# Patient Record
Sex: Male | Born: 1940
Health system: Southern US, Community
[De-identification: ages and names within clinical notes are randomized; demographics above are authoritative.]

## PROBLEM LIST (undated history)

## (undated) DIAGNOSIS — N200 Calculus of kidney: Secondary | ICD-10-CM

## (undated) DIAGNOSIS — I2510 Atherosclerotic heart disease of native coronary artery without angina pectoris: Secondary | ICD-10-CM

## (undated) DIAGNOSIS — E119 Type 2 diabetes mellitus without complications: Secondary | ICD-10-CM

## (undated) DIAGNOSIS — I719 Aortic aneurysm of unspecified site, without rupture: Secondary | ICD-10-CM

## (undated) DIAGNOSIS — R7302 Impaired glucose tolerance (oral): Secondary | ICD-10-CM

## (undated) DIAGNOSIS — E785 Hyperlipidemia, unspecified: Secondary | ICD-10-CM

## (undated) DIAGNOSIS — C679 Malignant neoplasm of bladder, unspecified: Secondary | ICD-10-CM

## (undated) DIAGNOSIS — G4730 Sleep apnea, unspecified: Secondary | ICD-10-CM

## (undated) DIAGNOSIS — I82409 Acute embolism and thrombosis of unspecified deep veins of unspecified lower extremity: Secondary | ICD-10-CM

## (undated) DIAGNOSIS — I10 Essential (primary) hypertension: Secondary | ICD-10-CM

## (undated) DIAGNOSIS — I219 Acute myocardial infarction, unspecified: Secondary | ICD-10-CM

## (undated) HISTORY — PX: HYDROCELE EXCISION: SHX482

## (undated) HISTORY — DX: Type 2 diabetes mellitus without complications: E11.9

## (undated) HISTORY — DX: Impaired glucose tolerance (oral): R73.02

## (undated) HISTORY — PX: PROSTATE SURGERY: SHX751

## (undated) HISTORY — DX: Aortic aneurysm of unspecified site, without rupture: I71.9

## (undated) HISTORY — DX: Acute myocardial infarction, unspecified: I21.9

## (undated) HISTORY — DX: Sleep apnea, unspecified: G47.30

## (undated) HISTORY — DX: Malignant neoplasm of bladder, unspecified: C67.9

## (undated) HISTORY — PX: OTHER SURGICAL HISTORY: SHX169

## (undated) HISTORY — DX: Acute embolism and thrombosis of unspecified deep veins of unspecified lower extremity: I82.409

## (undated) HISTORY — DX: Essential (primary) hypertension: I10

## (undated) HISTORY — DX: Hyperlipidemia, unspecified: E78.5

## (undated) HISTORY — DX: Atherosclerotic heart disease of native coronary artery without angina pectoris: I25.10

## (undated) HISTORY — DX: Calculus of kidney: N20.0

## (undated) HISTORY — PX: CARDIAC CATHETERIZATION: SHX172

---

## 1995-08-27 DIAGNOSIS — I219 Acute myocardial infarction, unspecified: Secondary | ICD-10-CM

## 1995-08-27 HISTORY — DX: Acute myocardial infarction, unspecified: I21.9

## 1995-08-27 HISTORY — PX: ANGIOPLASTY: SHX39

## 2004-02-28 ENCOUNTER — Other Ambulatory Visit: Payer: Self-pay

## 2004-08-26 LAB — HM COLONOSCOPY: HM Colonoscopy: NORMAL

## 2004-09-27 ENCOUNTER — Ambulatory Visit: Payer: Self-pay | Admitting: Urology

## 2004-10-19 ENCOUNTER — Ambulatory Visit: Payer: Self-pay | Admitting: Urology

## 2005-02-05 ENCOUNTER — Other Ambulatory Visit: Payer: Self-pay

## 2005-02-14 ENCOUNTER — Ambulatory Visit: Payer: Self-pay | Admitting: Otolaryngology

## 2005-04-09 ENCOUNTER — Ambulatory Visit: Payer: Self-pay | Admitting: Urology

## 2005-06-18 ENCOUNTER — Ambulatory Visit: Payer: Self-pay | Admitting: Urology

## 2005-07-04 ENCOUNTER — Ambulatory Visit: Payer: Self-pay | Admitting: Oncology

## 2005-07-05 ENCOUNTER — Ambulatory Visit: Payer: Self-pay | Admitting: Oncology

## 2005-07-26 ENCOUNTER — Ambulatory Visit: Payer: Self-pay | Admitting: Oncology

## 2005-08-26 ENCOUNTER — Ambulatory Visit: Payer: Self-pay | Admitting: Oncology

## 2005-09-26 ENCOUNTER — Ambulatory Visit: Payer: Self-pay | Admitting: Oncology

## 2005-10-25 ENCOUNTER — Ambulatory Visit: Payer: Self-pay | Admitting: Radiation Oncology

## 2006-04-09 ENCOUNTER — Other Ambulatory Visit: Payer: Self-pay

## 2006-04-15 ENCOUNTER — Ambulatory Visit: Payer: Self-pay | Admitting: Urology

## 2007-07-22 ENCOUNTER — Ambulatory Visit: Payer: Self-pay | Admitting: Urology

## 2007-10-07 DIAGNOSIS — I82409 Acute embolism and thrombosis of unspecified deep veins of unspecified lower extremity: Secondary | ICD-10-CM

## 2007-10-07 HISTORY — DX: Acute embolism and thrombosis of unspecified deep veins of unspecified lower extremity: I82.409

## 2008-01-20 ENCOUNTER — Ambulatory Visit: Payer: Self-pay | Admitting: Urology

## 2008-03-23 ENCOUNTER — Encounter: Payer: Self-pay | Admitting: Cardiovascular Disease

## 2008-04-08 ENCOUNTER — Encounter: Payer: Self-pay | Admitting: Cardiovascular Disease

## 2008-07-18 ENCOUNTER — Ambulatory Visit: Payer: Self-pay | Admitting: Urology

## 2008-07-18 ENCOUNTER — Encounter: Payer: Self-pay | Admitting: Cardiovascular Disease

## 2009-01-18 ENCOUNTER — Ambulatory Visit: Payer: Self-pay | Admitting: Urology

## 2009-07-17 ENCOUNTER — Ambulatory Visit: Payer: Self-pay | Admitting: Urology

## 2010-01-03 ENCOUNTER — Ambulatory Visit: Payer: Self-pay | Admitting: Urology

## 2010-04-24 ENCOUNTER — Encounter: Payer: Self-pay | Admitting: Cardiovascular Disease

## 2010-06-29 ENCOUNTER — Encounter: Payer: Self-pay | Admitting: Cardiovascular Disease

## 2010-06-29 ENCOUNTER — Ambulatory Visit: Payer: Self-pay | Admitting: Urology

## 2010-07-16 ENCOUNTER — Ambulatory Visit: Payer: Self-pay | Admitting: Cardiovascular Disease

## 2010-07-16 DIAGNOSIS — E7800 Pure hypercholesterolemia, unspecified: Secondary | ICD-10-CM | POA: Insufficient documentation

## 2010-07-16 DIAGNOSIS — I25118 Atherosclerotic heart disease of native coronary artery with other forms of angina pectoris: Secondary | ICD-10-CM | POA: Insufficient documentation

## 2010-07-16 DIAGNOSIS — I714 Abdominal aortic aneurysm, without rupture, unspecified: Secondary | ICD-10-CM | POA: Insufficient documentation

## 2010-07-16 DIAGNOSIS — I2510 Atherosclerotic heart disease of native coronary artery without angina pectoris: Secondary | ICD-10-CM | POA: Insufficient documentation

## 2010-07-24 ENCOUNTER — Encounter: Payer: Self-pay | Admitting: Cardiovascular Disease

## 2010-09-25 NOTE — Letter (Signed)
Summary: NPP  NPP   Imported By: Marylou Mccoy 07/30/2010 15:05:48  _____________________________________________________________________  External Attachment:    Type:   Image     Comment:   External Document

## 2010-09-25 NOTE — Letter (Signed)
Summary: [facility] Echo  [facility] Echo   Imported By: Marylou Mccoy 07/30/2010 15:02:09  _____________________________________________________________________  External Attachment:    Type:   Image     Comment:   External Document

## 2010-09-25 NOTE — Assessment & Plan Note (Signed)
Summary: NP6/AMD   Visit Type:  Initial Consult Primary Ejay Lashley:  Dr. Assunta Gambles  CC:  establish care- CAD.  History of Present Illness: Mr. Mall is a very pleasant 70 year old gentleman with a history of coronary artery is normal myocardial, moderate disease in diagonal vessel, RCA and proximal circumflex, obtuse marginal branch with normal systolic function, possible function on echocardiogram, history of DVT and PE: Bladder surgery, history of obstructive sleep apnea, hypertension, hyperlipidemia who presents to reestablish care. He was last seen at [facility] in 2010.   Reports that overall he has been feeling well. He was recently seen by Dr. Achilles Dunk and Dr. Wyn Quaker. He has been recently diagnosed with a small AAA. He denies any significant chest pain,  shortness of breath. He does not exercise. He has been tolerating simvastatin.   Last stress test was in 03/2008. He had an inferior wall defect consistent with ischemia. Significant GI uptake artifact noted in the inferior wall. EF 42%. No cardiac cath was performed at that time in follow up.  ECHO in 02/2008 shows normal E, otherwise normal study  Lipids have been at goal in 2009, LDL <70  EKG: NSR with rate of 73 bpm, with no significant ST or T wave changes  Preventive Screening-Counseling & Management  Alcohol-Tobacco     Smoking Status: never  Caffeine-Diet-Exercise     Does Patient Exercise: yes  Current Medications (verified): 1)  Ambien 10 Mg Tabs (Zolpidem Tartrate) .Marland Kitchen.. 1 Tablet At Bedtime 2)  Plavix 75 Mg Tabs (Clopidogrel Bisulfate) .Marland Kitchen.. 1 Tablet Once Daily 3)  Niaspan 1000 Mg Cr-Tabs (Niacin (Antihyperlipidemic)) .Marland Kitchen.. 1 Tablet Once Daily 4)  Ramipril 10 Mg Caps (Ramipril) .Marland Kitchen.. 1 Tablet Once Daily 5)  Ra Fish Oil 1000 Mg Caps (Omega-3 Fatty Acids) .... 2 Tablets in Morning and 2 Tablets in Afternoon 6)  Altace, ? Dose .Marland Kitchen.. 1 Tablet Once Daily 7)  Aspir-Low 81 Mg Tbec (Aspirin) .Marland Kitchen.. 1 Tablet Once Daily  Allergies  (verified): No Known Drug Allergies  Past History:  Family History: Last updated: 08-07-2010 Father:Deceased,Lung Cancer Mother:decreased, stroke  Social History: Last updated: 08-07-2010 Retired  Married  Tobacco Use - No.  Alcohol Use - no Regular Exercise - yes  Risk Factors: Exercise: yes (08/07/2010)  Risk Factors: Smoking Status: never (2010-08-07)  Past Medical History: Hydrocele Nephrolithiasis MI-1997 Bladder Cancer  Past Surgical History: Angioplasty- 1997- [facility] Bladder Removed Prostate Surgeryl  Family History: Father:Deceased,Lung Cancer Mother:decreased, stroke  Social History: Retired  Married  Tobacco Use - No.  Alcohol Use - no Regular Exercise - yes Smoking Status:  never Does Patient Exercise:  yes  Review of Systems       The patient complains of weight gain.  The patient denies fever, weight loss, vision loss, decreased hearing, hoarseness, chest pain, syncope, dyspnea on exertion, peripheral edema, prolonged cough, abdominal pain, incontinence, muscle weakness, depression, and enlarged lymph nodes.    Vital Signs:  Patient profile:   70 year old male Height:      71.5 inches Weight:      300.50 pounds BMI:     41.48 Pulse rate:   73 / minute BP sitting:   142 / 84  (left arm) Cuff size:   large  Vitals Entered By: Bishop Dublin, CMA (08/07/2010 3:13 PM)  Physical Exam  General:  Well developed, well nourished, in no acute distress.Obese Head:  normocephalic and atraumatic Neck:  Neck supple, no JVD. No masses, thyromegaly or abnormal cervical nodes. Lungs:  Clear bilaterally to auscultation and percussion. Heart:  Non-displaced PMI, chest non-tender; regular rate and rhythm, S1, S2 without murmurs, rubs or gallops. Carotid upstroke normal, no bruit. Pedals normal pulses. No edema, no varicosities. Abdomen:  Bowel sounds positive; abdomen soft and non-tender without masses, obese Msk:  Back normal, normal gait.  Muscle strength and tone normal. Pulses:  pulses normal in all 4 extremities Extremities:  No clubbing or cyanosis. Neurologic:  Alert and oriented x 3. Skin:  Intact without lesions or rashes. Psych:  Normal affect.   Impression & Recommendations:  Problem # 1:  CAD, NATIVE VESSEL (ICD-414.01) No symptoms at this time. would not qualify for stress testing at this time. Will monitor closely.  His updated medication list for this problem includes:    Plavix 75 Mg Tabs (Clopidogrel bisulfate) .Marland Kitchen... 1 tablet once daily    Ramipril 10 Mg Caps (Ramipril) .Marland Kitchen... 1 tablet once daily    Aspir-low 81 Mg Tbec (Aspirin) .Marland Kitchen... 1 tablet once daily  Problem # 2:  HYPERLIPIDEMIA-MIXED (ICD-272.4) Will try to obtain most recent lipids fro Dr. Oswaldo Done  His updated medication list for this problem includes:    Niaspan 1000 Mg Cr-tabs (Niacin (antihyperlipidemic)) .Marland Kitchen... 1 tablet once daily  Problem # 3:  OBESITY-MORBID (>100') (ICD-278.01) Have encouraged him to join weight watchers and start exercising.  Problem # 4:  ABDOMINAL AORTIC ANEURYSM (ICD-441.4) Small AAA noted. Will continue lipid management with goal LDL <70.  Patient Instructions: 1)  Your physician recommends that you schedule a follow-up appointment in: 1 year 2)  Your physician recommends a diabetes diet. Please see handout given to you at today's office visit.  Appended Document: NP6/AMD Notes from Dr. Achilles Dunk indicate he has a h/o bladder cancer. Currently in remission.

## 2010-09-25 NOTE — Letter (Signed)
Summary: [facility] - Coronary Diagram  [facility] - Coronary Diagram   Imported By: Marylou Mccoy 07/30/2010 15:02:53  _____________________________________________________________________  External Attachment:    Type:   Image     Comment:   External Document

## 2010-09-25 NOTE — Letter (Signed)
Summary: [facility] - Stress  [facility] - Stress   Imported By: Marylou Mccoy 07/30/2010 15:00:48  _____________________________________________________________________  External Attachment:    Type:   Image     Comment:   External Document

## 2010-09-25 NOTE — Letter (Signed)
Summary: [facility]   [facility]   Imported By: Marylou Mccoy 08/02/2010 16:11:47  _____________________________________________________________________  External Attachment:    Type:   Image     Comment:   External Document

## 2010-09-25 NOTE — Letter (Signed)
Summary: Medical Record Release  Medical Record Release   Imported By: Harlon Flor 07/17/2010 15:10:47  _____________________________________________________________________  External Attachment:    Type:   Image     Comment:   External Document

## 2010-10-25 ENCOUNTER — Encounter: Payer: Self-pay | Admitting: Cardiovascular Disease

## 2011-01-07 ENCOUNTER — Ambulatory Visit: Payer: Self-pay | Admitting: Urology

## 2011-01-07 ENCOUNTER — Encounter: Payer: Self-pay | Admitting: Cardiovascular Disease

## 2011-03-13 ENCOUNTER — Encounter: Payer: Self-pay | Admitting: Cardiovascular Disease

## 2011-07-22 ENCOUNTER — Ambulatory Visit: Payer: Self-pay | Admitting: Urology

## 2011-07-23 ENCOUNTER — Ambulatory Visit: Payer: Self-pay | Admitting: Urology

## 2011-07-23 DIAGNOSIS — I10 Essential (primary) hypertension: Secondary | ICD-10-CM

## 2011-07-30 ENCOUNTER — Ambulatory Visit: Payer: Self-pay | Admitting: Urology

## 2011-08-01 LAB — PATHOLOGY REPORT

## 2011-08-27 HISTORY — PX: COLONOSCOPY: SHX174

## 2011-12-18 ENCOUNTER — Other Ambulatory Visit: Payer: Self-pay | Admitting: Medical Oncology

## 2011-12-18 NOTE — Telephone Encounter (Signed)
Wrong chart

## 2011-12-19 ENCOUNTER — Ambulatory Visit: Payer: Self-pay | Admitting: Family Medicine

## 2012-01-06 ENCOUNTER — Ambulatory Visit: Payer: PRIVATE HEALTH INSURANCE | Admitting: Cardiovascular Disease

## 2012-01-08 ENCOUNTER — Ambulatory Visit (INDEPENDENT_AMBULATORY_CARE_PROVIDER_SITE_OTHER): Payer: PRIVATE HEALTH INSURANCE | Admitting: Cardiovascular Disease

## 2012-01-08 ENCOUNTER — Encounter: Payer: Self-pay | Admitting: Cardiovascular Disease

## 2012-01-08 VITALS — BP 125/80 | HR 74 | Ht 71.0 in | Wt 288.0 lb

## 2012-01-08 DIAGNOSIS — E785 Hyperlipidemia, unspecified: Secondary | ICD-10-CM

## 2012-01-08 DIAGNOSIS — I2510 Atherosclerotic heart disease of native coronary artery without angina pectoris: Secondary | ICD-10-CM

## 2012-01-08 DIAGNOSIS — I714 Abdominal aortic aneurysm, without rupture, unspecified: Secondary | ICD-10-CM

## 2012-01-08 NOTE — Assessment & Plan Note (Addendum)
He has periodic followup with Dr. Wyn Quaker. He reports 2.5 cm aneurysm (?) which is borderline mildly enlarged.

## 2012-01-08 NOTE — Assessment & Plan Note (Signed)
No symptoms at this time. EKG unchanged. We have suggested he increase his exercise. We have discussed symptoms that we would be concerned about increasing shortness of breath, chest tightness, sweating, excess fatigue. He will call us if he has any of the symptoms. We have recommended continued weight loss with goal LDL less than 70.

## 2012-01-08 NOTE — Patient Instructions (Signed)
You are doing well. No medication changes were made.  Please call us if you have new issues that need to be addressed before your next appt.  Your physician wants you to follow-up in: 12 months.  You will receive a reminder letter in the mail two months in advance. If you don't receive a letter, please call our office to schedule the follow-up appointment. 

## 2012-01-08 NOTE — Assessment & Plan Note (Signed)
Cholesterol is very close to goal. We have encouraged him to continue his weight loss and commended him on his recent 18 pound weight drop.

## 2012-01-08 NOTE — Progress Notes (Signed)
Patient ID: Christian [name], male    DOB: Feb 01, 1941, 71 y.o.   MRN: [id number]  HPI Comments: [name] is a very pleasant 71 year old gentleman with a history of  obesity , CAD, moderate disease in a diagonal vessel, RCA and proximal circumflex, obtuse marginal branch with normal systolic function, possible function on echocardiogram, history of DVT and PE, Bladder surgery, history of obstructive sleep apnea, hypertension, hyperlipidemia, small AAA,  who presents for routine followup.   Reports that overall he has been feeling well. He has recently lost 18 pounds by diet modification. He is not exercising a tremendous amount. He denies any significant back or knee pain. No significant shortness of breath or chest discomfort with exertion. No new complaints.   Last stress test was in 03/2008. He had an inferior wall defect consistent with ischemia. Significant GI uptake artifact noted in the inferior wall. EF 42%. No cardiac cath was performed at that time in follow up.   ECHO in 02/2008 shows normal E, otherwise normal study  Lipids have been at goal in 2012, LDL <75, total cholesterol 145   EKG done by Dr. Katrinka Blazing shows: NSR  with no significant ST or T wave changes No changes when compared to EKG from 2012      Outpatient Encounter Prescriptions as of 01/08/2012  Medication Sig Dispense Refill  . aspirin (ASPIR-LOW) 81 MG EC tablet Take 81 mg by mouth daily.        . clopidogrel (PLAVIX) 75 MG tablet Take 75 mg by mouth daily.        . Omega-3 Fatty Acids (RA FISH OIL) 1000 MG CAPS Take by mouth. Take 2 tabs in morning and 2 tabs i afternoon       . ramipril (ALTACE) 10 MG tablet Take 10 mg by mouth daily.        . simvastatin (ZOCOR) 40 MG tablet Take 40 mg by mouth daily.       Review of Systems  Constitutional: Negative.   HENT: Negative.   Eyes: Negative.   Respiratory: Negative.   Cardiovascular: Negative.   Gastrointestinal: Negative.   Musculoskeletal: Negative.   Skin:  Negative.   Neurological: Negative.   Hematological: Negative.   Psychiatric/Behavioral: Negative.   All other systems reviewed and are negative.    BP 125/80  Pulse 74  Ht 5\' 11"  (1.803 m)  Wt 288 lb (130.636 kg)  BMI 40.17 kg/m2  Physical Exam  Nursing note and vitals reviewed. Constitutional: He is oriented to person, place, and time. He appears well-developed and well-nourished.       Obese  HENT:  Head: Normocephalic.  Nose: Nose normal.  Mouth/Throat: Oropharynx is clear and moist.  Eyes: Conjunctivae are normal. Pupils are equal, round, and reactive to light.  Neck: Normal range of motion. Neck supple. No JVD present.  Cardiovascular: Normal rate, regular rhythm, S1 normal, S2 normal, normal heart sounds and intact distal pulses.  Exam reveals no gallop and no friction rub.   No murmur heard. Pulmonary/Chest: Effort normal and breath sounds normal. No respiratory distress. He has no wheezes. He has no rales. He exhibits no tenderness.  Abdominal: Soft. Bowel sounds are normal. He exhibits no distension. There is no tenderness.  Musculoskeletal: Normal range of motion. He exhibits no edema and no tenderness.  Lymphadenopathy:    He has no cervical adenopathy.  Neurological: He is alert and oriented to person, place, and time. Coordination normal.  Skin: Skin is warm and dry. No  rash noted. No erythema.  Psychiatric: He has a normal mood and affect. His behavior is normal. Judgment and thought content normal.           Assessment and Plan

## 2012-08-03 ENCOUNTER — Ambulatory Visit: Payer: Self-pay | Admitting: Unknown Physician Specialty

## 2012-08-03 LAB — HM COLONOSCOPY

## 2012-08-30 ENCOUNTER — Other Ambulatory Visit: Payer: Self-pay | Admitting: Family Medicine

## 2012-08-31 ENCOUNTER — Other Ambulatory Visit: Payer: Self-pay | Admitting: Family Medicine

## 2012-08-31 NOTE — Telephone Encounter (Signed)
No paper chart °

## 2013-01-20 ENCOUNTER — Ambulatory Visit: Payer: Self-pay | Admitting: Urology

## 2013-01-20 DIAGNOSIS — N200 Calculus of kidney: Secondary | ICD-10-CM | POA: Insufficient documentation

## 2013-01-20 DIAGNOSIS — N430 Encysted hydrocele: Secondary | ICD-10-CM | POA: Insufficient documentation

## 2013-05-14 ENCOUNTER — Encounter: Payer: Self-pay | Admitting: Family Medicine

## 2013-05-20 ENCOUNTER — Ambulatory Visit: Payer: PRIVATE HEALTH INSURANCE | Admitting: Cardiovascular Disease

## 2013-05-24 ENCOUNTER — Ambulatory Visit (INDEPENDENT_AMBULATORY_CARE_PROVIDER_SITE_OTHER): Payer: PRIVATE HEALTH INSURANCE | Admitting: Cardiovascular Disease

## 2013-05-24 ENCOUNTER — Encounter: Payer: Self-pay | Admitting: Cardiovascular Disease

## 2013-05-24 VITALS — BP 130/68 | HR 76 | Ht 71.5 in | Wt 294.2 lb

## 2013-05-24 DIAGNOSIS — E785 Hyperlipidemia, unspecified: Secondary | ICD-10-CM

## 2013-05-24 DIAGNOSIS — I2510 Atherosclerotic heart disease of native coronary artery without angina pectoris: Secondary | ICD-10-CM

## 2013-05-24 MED ORDER — NITROGLYCERIN 0.4 MG SL SUBL: 0.4000 mg | SUBLINGUAL_TABLET | SUBLINGUAL | Status: DC | PRN

## 2013-05-24 NOTE — Progress Notes (Signed)
Patient ID: Christian [name], male    DOB: 1941/06/30, 72 y.o.   MRN: [id number]  HPI Comments: Mr. Scheuring is a very pleasant 72 year old gentleman with a history of  obesity , CAD, moderate disease in a diagonal vessel, RCA and proximal circumflex, obtuse marginal branch with normal systolic function, possible function on echocardiogram, history of DVT and PE, Bladder surgery, history of obstructive sleep apnea, hypertension, hyperlipidemia, small AAA,  who presents for routine followup.   Reports that overall he has been feeling well.  He is not exercising a tremendous amount. His wife has been having significant medical issues.  He denies any significant back or knee pain. No significant shortness of breath or chest discomfort with exertion. No new complaints. He does report when he pushed most, he has pressure in the back of his arms. This is typically after he has been doing the weed eating. He does not have arm pain with any other aerobic activity.   Last stress test was in 03/2008. He had an inferior wall defect consistent with ischemia. Significant GI uptake artifact noted in the inferior wall. EF 42%. No cardiac cath was performed at that time in follow up.   ECHO in 02/2008 shows normal E, otherwise normal study  Recent lab work shows total cholesterol 115, LDL 51, HDL 30 EKG  shows: NSR  with nonspecific ST and T wave abnormality in the anterior precordial leads, 3 and aVF     Outpatient Encounter Prescriptions as of 05/24/2013  Medication Sig Dispense Refill  . aspirin (ASPIR-LOW) 81 MG EC tablet Take 81 mg by mouth daily.        . clopidogrel (PLAVIX) 75 MG tablet Take 75 mg by mouth daily.        . nitroGLYCERIN (NITROSTAT) 0.4 MG SL tablet Place 0.4 mg under the tongue every 5 (five) minutes as needed for chest pain.      . Omega-3 Fatty Acids (RA FISH OIL) 1000 MG CAPS Take by mouth. Take 2 tabs in morning and 2 tabs i afternoon       . ramipril (ALTACE) 10 MG tablet Take 10 mg by  mouth daily.        . simvastatin (ZOCOR) 40 MG tablet Take 40 mg by mouth daily.       No facility-administered encounter medications on file as of 05/24/2013.    Review of Systems  Constitutional: Negative.   HENT: Negative.   Eyes: Negative.   Respiratory: Negative.   Cardiovascular: Negative.   Gastrointestinal: Negative.   Endocrine: Negative.   Musculoskeletal: Negative.   Skin: Negative.   Allergic/Immunologic: Negative.   Neurological: Negative.   Hematological: Negative.   Psychiatric/Behavioral: Negative.   All other systems reviewed and are negative.    BP 130/68  Pulse 76  Ht 5' 11.5" (1.816 m)  Wt 294 lb 4 oz (133.471 kg)  BMI 40.47 kg/m2  Physical Exam  Nursing note and vitals reviewed. Constitutional: He is oriented to person, place, and time. He appears well-developed and well-nourished.  Obese  HENT:  Head: Normocephalic.  Nose: Nose normal.  Mouth/Throat: Oropharynx is clear and moist.  Eyes: Conjunctivae are normal. Pupils are equal, round, and reactive to light.  Neck: Normal range of motion. Neck supple. No JVD present.  Cardiovascular: Normal rate, regular rhythm, S1 normal, S2 normal, normal heart sounds and intact distal pulses.  Exam reveals no gallop and no friction rub.   No murmur heard. Pulmonary/Chest: Effort normal and breath sounds normal.  No respiratory distress. He has no wheezes. He has no rales. He exhibits no tenderness.  Abdominal: Soft. Bowel sounds are normal. He exhibits no distension. There is no tenderness.  Musculoskeletal: Normal range of motion. He exhibits no edema and no tenderness.  Lymphadenopathy:    He has no cervical adenopathy.  Neurological: He is alert and oriented to person, place, and time. Coordination normal.  Skin: Skin is warm and dry. No rash noted. No erythema.  Psychiatric: He has a normal mood and affect. His behavior is normal. Judgment and thought content normal.      Assessment and Plan

## 2013-05-24 NOTE — Assessment & Plan Note (Signed)
Asymptomatic with exertion apart from some mild arm pain bilaterally after weed eating and then push mowing. Arm pain not reproducible with other aerobic activities. Suspect it is musculoskeletal. We have asked him to cause of symptoms get worse. There are subtle changes on his EKG compared to 2011.

## 2013-05-24 NOTE — Assessment & Plan Note (Signed)
We have encouraged continued exercise, careful diet management in an effort to lose weight. 

## 2013-05-24 NOTE — Assessment & Plan Note (Signed)
Cholesterol is at goal on the current lipid regimen. No changes to the medications were made.  

## 2013-05-24 NOTE — Patient Instructions (Addendum)
You are doing well. No medication changes were made.  Please call us if you have new issues that need to be addressed before your next appt.  Your physician wants you to follow-up in: 12 months.  You will receive a reminder letter in the mail two months in advance. If you don't receive a letter, please call our office to schedule the follow-up appointment. 

## 2013-06-02 ENCOUNTER — Ambulatory Visit (INDEPENDENT_AMBULATORY_CARE_PROVIDER_SITE_OTHER): Payer: Medicare Other | Admitting: Family Medicine

## 2013-06-02 ENCOUNTER — Encounter: Payer: Self-pay | Admitting: Family Medicine

## 2013-06-02 VITALS — BP 124/74 | HR 68 | Temp 98.3°F | Resp 16 | Ht 69.0 in | Wt 292.0 lb

## 2013-06-02 DIAGNOSIS — C679 Malignant neoplasm of bladder, unspecified: Secondary | ICD-10-CM

## 2013-06-02 DIAGNOSIS — R7309 Other abnormal glucose: Secondary | ICD-10-CM

## 2013-06-02 DIAGNOSIS — Z23 Encounter for immunization: Secondary | ICD-10-CM

## 2013-06-02 DIAGNOSIS — E7800 Pure hypercholesterolemia, unspecified: Secondary | ICD-10-CM

## 2013-06-02 DIAGNOSIS — I10 Essential (primary) hypertension: Secondary | ICD-10-CM

## 2013-06-02 DIAGNOSIS — I2510 Atherosclerotic heart disease of native coronary artery without angina pectoris: Secondary | ICD-10-CM

## 2013-06-02 NOTE — Progress Notes (Signed)
[address]   [place], [place]  [address]   [phone]  Subjective:    Patient ID: Christian [name], male    DOB: December 11, 1940, 72 y.o.   MRN: [id number]  HPI This 72 y.o. male presents to re-establish care.   1.  HTN: no changes to therapy since last visit; compliance with medication; good tolerance to medication; good symptom control.    2. Hyperlipidemia: no changes to therapy recently; reports good tolerance to medication; good compliance to medication; good symptom control.   3.  CAD: s/p previous AMI in 1997; s/p PTCA at [facility] with AMI.  Followed regularly by Dr. Mariah Milling.  No recent issues; denies CP/palp/SOB/leg swelling.  4. Bladder cancer: stable; followed by Dr. Achilles Dunk annually.  No recent issues.    Past Medical History  Diagnosis Date  . Hydrocele   . Nephrolithiasis   . MI (myocardial infarction) 1997  . Bladder cancer    Past Surgical History  Procedure Laterality Date  . Angioplasty  [address]  . Bladder removed    . Prostate surgery    . Hydrocele excision    . Colonoscopy  08/27/2011    normal.  [facility].   No Known Allergies Current Outpatient Prescriptions on File Prior to Visit  Medication Sig Dispense Refill  . aspirin (ASPIR-LOW) 81 MG EC tablet Take 81 mg by mouth daily.        . clopidogrel (PLAVIX) 75 MG tablet Take 75 mg by mouth daily.        . nitroGLYCERIN (NITROSTAT) 0.4 MG SL tablet Place 1 tablet (0.4 mg total) under the tongue every 5 (five) minutes as needed for chest pain.  25 tablet  6  . Omega-3 Fatty Acids (RA FISH OIL) 1000 MG CAPS Take by mouth. Take 2 tabs in morning and 2 tabs i afternoon       . ramipril (ALTACE) 10 MG tablet Take 10 mg by mouth daily.         No current facility-administered medications on file prior to visit.   History   Social History  . Marital Status: Married    Spouse Name: N/A    Number of Children: N/A  . Years of Education: N/A   Occupational History  . Not on file.    Social History Main Topics  . Smoking status: Never Smoker   . Smokeless tobacco: Not on file     Comment: tobacco use- no   . Alcohol Use: No  . Drug Use: No  . Sexual Activity: Not on file   Other Topics Concern  . Not on file   Social History Narrative   Retired. Regularly exercise.    Family History  Problem Relation Age of Onset  . Stroke Mother   . Lung cancer Father         Review of Systems     Objective:   Physical Exam  Nursing note and vitals reviewed. Constitutional: He is oriented to person, place, and time. He appears well-developed and well-nourished. No distress.  HENT:  Head: Normocephalic and atraumatic.  Right Ear: External ear normal.  Left Ear: External ear normal.  Nose: Nose normal.  Mouth/Throat: Oropharynx is clear and moist.  Eyes: Conjunctivae and EOM are normal. Pupils are equal, round, and reactive to light.  Neck: Normal range of motion. Neck supple. Carotid bruit is not present. No thyromegaly present.  Cardiovascular: Normal rate, regular rhythm, normal heart sounds and intact distal  pulses.  Exam reveals no gallop and no friction rub.   No murmur heard. Pulmonary/Chest: Effort normal and breath sounds normal. He has no wheezes. He has no rales.  Abdominal: Soft. Bowel sounds are normal. He exhibits no distension and no mass. There is no tenderness. There is no rebound and no guarding.  Lymphadenopathy:    He has no cervical adenopathy.  Neurological: He is alert and oriented to person, place, and time. No cranial nerve deficit.  Skin: Skin is warm and dry. No rash noted. He is not diaphoretic.  Psychiatric: He has a normal mood and affect. His behavior is normal.   INFLUENZA VACCINE ADMINISTERED.     Assessment & Plan:  Pure hypercholesterolemia - Plan: Lipid panel, CK, CANCELED: Lipid panel, CANCELED: CK  Other abnormal glucose - Plan: Comprehensive metabolic panel, Hemoglobin A1c, CANCELED: CBC, CANCELED: Hemoglobin  A1c  Essential hypertension, benign - Plan: CBC, Comprehensive metabolic panel, CK, CANCELED: Comprehensive metabolic panel, CANCELED: CK  Need for prophylactic vaccination and inoculation against influenza - Plan: Flu Vaccine QUAD 36+ mos IM  CAD, NATIVE VESSEL  Bladder cancer  1. Hyperlipidemia: controlled; obtain labs; continue current medications. 2.  Glucose intolerance: controlled with diet; repeat labs; continue with attempts at weight loss, exercise, dietary modification. 3.  HTN: controlled; obtain labs; continue current medications. 4.  CAD: stable and asymptomatic; followed by Mariah Milling every year. 5.  Bladder cancer: stable; followed annually by [facility]. 6. S/p flu vaccine.  No orders of the defined types were placed in this encounter.   Nilda Simmer, M.D.  [facility] [address] [place], [place]  [address] [phone] phone [phone] fax

## 2013-06-03 ENCOUNTER — Telehealth: Payer: Self-pay

## 2013-06-03 NOTE — Telephone Encounter (Signed)
Dr. Katrinka Blazing:  His wife calls to say her husband had his flu shot at 4:00p.m. And is very sick today with a fever and no appetite.  He is nauseas.    Best number is [phone]. Please call them.

## 2013-06-04 LAB — COMPREHENSIVE METABOLIC PANEL
ALT: 44 IU/L (ref 0–44)
AST: 38 IU/L (ref 0–40)
Albumin/Globulin Ratio: 1.8 (ref 1.1–2.5)
Alkaline Phosphatase: 51 IU/L (ref 39–117)
BUN: 18 mg/dL (ref 8–27)
Calcium: 9.5 mg/dL (ref 8.6–10.2)
Chloride: 100 mmol/L (ref 97–108)
Creatinine, Ser: 0.9 mg/dL (ref 0.76–1.27)
GFR calc non Af Amer: 86 mL/min/{1.73_m2} (ref >59)
Potassium: 4.4 mmol/L (ref 3.5–5.2)
Sodium: 141 mmol/L (ref 134–144)
Total Bilirubin: 0.3 mg/dL (ref 0.0–1.2)

## 2013-06-04 LAB — LIPID PANEL
Cholesterol, Total: 151 mg/dL (ref 100–199)
HDL: 34 mg/dL — ABNORMAL LOW (ref >39)
Triglycerides: 302 mg/dL — ABNORMAL HIGH (ref 0–149)
VLDL Cholesterol Cal: 60 mg/dL — ABNORMAL HIGH (ref 5–40)

## 2013-06-04 LAB — HEMOGLOBIN A1C
Est. average glucose Bld gHb Est-mCnc: 128 mg/dL
Hgb A1c MFr Bld: 6.1 % — ABNORMAL HIGH (ref 4.8–5.6)

## 2013-06-04 LAB — CBC
HCT: 42.6 % (ref 37.5–51.0)
Hemoglobin: 14.7 g/dL (ref 12.6–17.7)
MCH: 33 pg (ref 26.6–33.0)
MCHC: 34.5 g/dL (ref 31.5–35.7)
MCV: 96 fL (ref 79–97)
Platelets: 273 10*3/uL (ref 150–379)
RBC: 4.46 x10E6/uL (ref 4.14–5.80)
RDW: 14.7 % (ref 12.3–15.4)
WBC: 9.1 10*3/uL (ref 3.4–10.8)

## 2013-06-04 LAB — CK: Total CK: 71 U/L (ref 24–204)

## 2013-06-04 NOTE — Telephone Encounter (Signed)
Noted. Agree with below anticipatory guidance.

## 2013-06-04 NOTE — Telephone Encounter (Signed)
Called to check on him. He indicates he is aching and sore all over, has a fever. Advised him to alternate tylenol and ibuprofen (he has been using Tylenol). He states he does not think it is from the flu shot, he thinks he picked up a virus, I advised him also not likely to be from the flu shot. Advised if he gets worse or if the fever does not come down on the tylenol and ibuprofen to come back in, he agrees, encouraged him also to get lots of fluids, he states he will.  To you FYI

## 2013-06-14 ENCOUNTER — Encounter: Payer: Self-pay | Admitting: Family Medicine

## 2013-06-15 ENCOUNTER — Encounter: Payer: Self-pay | Admitting: Family Medicine

## 2013-06-15 ENCOUNTER — Other Ambulatory Visit: Payer: Self-pay | Admitting: Family Medicine

## 2013-06-15 MED ORDER — SIMVASTATIN 40 MG PO TABS: 40.0000 mg | ORAL_TABLET | Freq: Every day | ORAL | Status: DC

## 2013-08-04 ENCOUNTER — Encounter: Payer: Self-pay | Admitting: Family Medicine

## 2013-11-04 DIAGNOSIS — R7309 Other abnormal glucose: Secondary | ICD-10-CM | POA: Insufficient documentation

## 2013-11-04 DIAGNOSIS — Z8551 Personal history of malignant neoplasm of bladder: Secondary | ICD-10-CM | POA: Insufficient documentation

## 2013-12-15 ENCOUNTER — Encounter: Payer: Medicare Other | Admitting: Family Medicine

## 2013-12-27 ENCOUNTER — Encounter: Payer: Self-pay | Admitting: Family Medicine

## 2013-12-27 ENCOUNTER — Ambulatory Visit (INDEPENDENT_AMBULATORY_CARE_PROVIDER_SITE_OTHER): Payer: Medicare Other | Admitting: Family Medicine

## 2013-12-27 VITALS — BP 130/70 | HR 69 | Temp 98.5°F | Resp 16 | Ht 69.0 in | Wt 290.8 lb

## 2013-12-27 DIAGNOSIS — R7309 Other abnormal glucose: Secondary | ICD-10-CM

## 2013-12-27 DIAGNOSIS — E7800 Pure hypercholesterolemia, unspecified: Secondary | ICD-10-CM

## 2013-12-27 DIAGNOSIS — L989 Disorder of the skin and subcutaneous tissue, unspecified: Secondary | ICD-10-CM

## 2013-12-27 DIAGNOSIS — I10 Essential (primary) hypertension: Secondary | ICD-10-CM

## 2013-12-27 DIAGNOSIS — Z0000 Encounter for general adult medical examination without abnormal findings: Secondary | ICD-10-CM

## 2013-12-27 DIAGNOSIS — Z125 Encounter for screening for malignant neoplasm of prostate: Secondary | ICD-10-CM

## 2013-12-27 LAB — COMPLETE METABOLIC PANEL WITH GFR
ALBUMIN: 4.3 g/dL (ref 3.5–5.2)
ALK PHOS: 48 U/L (ref 39–117)
ALT: 48 U/L (ref 0–53)
AST: 41 U/L — ABNORMAL HIGH (ref 0–37)
BUN: 11 mg/dL (ref 6–23)
CO2: 27 mEq/L (ref 19–32)
CREATININE: 0.88 mg/dL (ref 0.50–1.35)
Calcium: 9.4 mg/dL (ref 8.4–10.5)
Chloride: 100 mEq/L (ref 96–112)
GFR, Est African American: >89 mL/min
GFR, Est Non African American: 87 mL/min
GLUCOSE: 95 mg/dL (ref 70–99)
POTASSIUM: 4.2 meq/L (ref 3.5–5.3)
Sodium: 138 mEq/L (ref 135–145)
Total Bilirubin: 0.5 mg/dL (ref 0.2–1.2)
Total Protein: 7.6 g/dL (ref 6.0–8.3)

## 2013-12-27 LAB — CBC WITH DIFFERENTIAL/PLATELET
BASOS PCT: 0 % (ref 0–1)
Basophils Absolute: 0 10*3/uL (ref 0.0–0.1)
EOS ABS: 0.2 10*3/uL (ref 0.0–0.7)
EOS PCT: 2 % (ref 0–5)
HEMATOCRIT: 41.8 % (ref 39.0–52.0)
HEMOGLOBIN: 14.8 g/dL (ref 13.0–17.0)
Lymphocytes Relative: 28 % (ref 12–46)
Lymphs Abs: 2.3 10*3/uL (ref 0.7–4.0)
MCH: 32.5 pg (ref 26.0–34.0)
MCHC: 35.4 g/dL (ref 30.0–36.0)
MCV: 91.9 fL (ref 78.0–100.0)
MONO ABS: 0.7 10*3/uL (ref 0.1–1.0)
MONOS PCT: 8 % (ref 3–12)
NEUTROS ABS: 5.1 10*3/uL (ref 1.7–7.7)
Neutrophils Relative %: 62 % (ref 43–77)
Platelets: 281 10*3/uL (ref 150–400)
RBC: 4.55 MIL/uL (ref 4.22–5.81)
RDW: 15 % (ref 11.5–15.5)
WBC: 8.2 10*3/uL (ref 4.0–10.5)

## 2013-12-27 LAB — LIPID PANEL
Cholesterol: 143 mg/dL (ref 0–200)
HDL: 34 mg/dL — ABNORMAL LOW (ref >39)
LDL CALC: 70 mg/dL (ref 0–99)
TRIGLYCERIDES: 193 mg/dL — AB (ref <150)
Total CHOL/HDL Ratio: 4.2 Ratio
VLDL: 39 mg/dL (ref 0–40)

## 2013-12-27 MED ORDER — CLOPIDOGREL BISULFATE 75 MG PO TABS: 75.0000 mg | ORAL_TABLET | Freq: Every day | ORAL | Status: DC

## 2013-12-27 MED ORDER — RAMIPRIL 10 MG PO CAPS: 10.0000 mg | ORAL_CAPSULE | Freq: Every day | ORAL | Status: DC

## 2013-12-27 MED ORDER — SIMVASTATIN 40 MG PO TABS: 40.0000 mg | ORAL_TABLET | Freq: Every day | ORAL | Status: DC

## 2013-12-27 NOTE — Progress Notes (Signed)
Subjective:   This chart was scribed for Christian Honour, MD by Christian [name], [facility] Scribe. This patient was seen in room 22 and the patient's care was started 2:04 PM.    Patient ID: Christian [name], male    DOB: 06/16/1941, 73 y.o.   MRN: [id number]  12/27/2013  Annual Exam   HPI  HPI Comments: Christian [name] is a 73 y.o. male who presents to [facility] for a complete physical examination today.  Pt states he recently had a "choking spell" after eating a spicy piece of meat. After coughing up the food, he experienced ongoing coughing for about 15 minutes. States he has still noted mild hoarseness in his throat he associates with the episode.  Pt states his bowels are moving well. No constipation or diarrhea.  States he is sleeping well and goes to sleep around 10:30-11 PM. He occasionally wakes up throughout the night, but does not have difficulty falling back asleep.  He admits to some sadness secondary to recent financial strain. He denies any depression or anxiety at this time.  Mother died at 51 of a stroke. She also had diabetes. Father died of lung cancer at the age 30. He had no other heart problems. He has 1 sister and 1 brother; both diabetic.   He has been happily married for 29 years. This is his 3rd marriage. He has 2 daughters, 3 grand children, and 3 great grandchildren. 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  Pt was a smoker when he was a teenager but has not smoked since. He does not consume alcohol.  He states he walked about 14 blocks total daily.  At this time he denies any tinnitus, dizziness, HA, blurred vision, visual disturbances, fever, chills, congestion, chest pain, palpitations, cough, nausea, vomiting, or rash. No neck pain or shoulder pain. No mouth sores.  Pt has been cancer free a little over 8 years now   Pt follows up with Urology  01/2014 Christian [name]. Will follow with Christian [name] for his aortic aneurysm within 6 months. Will follow with Christian [name] within the next 6 months for CAD.  Last Physical Examination: 12/2011 at [facility]. Last colonoscopy: 08/03/12 performed by Dr. Vira [name]- 1 polyp found and removed  Last Dental visit: Pt has an appt scheduled within the next few months Last Eye follow up: Pt has an appt scheduled within the next few months-Christian [name] Last Tdap: Declined in 2013. Flu Vaccination: 2014 Pneumovax: 2009 Zostavax: Declined at last physical 2013.  He is currently taking: Aspirin, Plavix, Fish Oil, Altace, and Zocor.  No other concerns this visit.  Rash B anterior legs: has been applying OTC hydrocortisone to rash; scaling now resolved but now two areas with erythema.  Non-tender; no itching.  Review of Systems  Constitutional: Negative for fever, chills, diaphoresis, activity change, appetite change and fatigue.  HENT: Negative for congestion, ear pain, hearing loss, mouth sores, rhinorrhea, sinus pressure, sneezing and tinnitus.   Eyes: Negative for photophobia, redness and visual disturbance.  Respiratory: Negative for cough, shortness of breath, wheezing and stridor.   Cardiovascular: Negative for chest pain, palpitations and leg swelling.  Gastrointestinal: Negative for nausea, vomiting, abdominal pain, diarrhea, constipation, blood in stool, anal bleeding and rectal pain.  Endocrine: Negative for cold intolerance, heat intolerance, polydipsia, polyphagia and polyuria.  Genitourinary: Negative for urgency, scrotal swelling, genital sores, penile pain  and testicular pain.  Musculoskeletal: Negative for arthralgias, back pain, gait problem, joint swelling, myalgias, neck pain and neck stiffness.  Skin: Positive for rash.  Neurological: Negative for dizziness, tremors, seizures, syncope, facial asymmetry, speech difficulty, weakness, light-headedness, numbness and headaches.  Psychiatric/Behavioral:  Negative for confusion, sleep disturbance and dysphoric mood. The patient is not nervous/anxious.   All other systems reviewed and are negative.   Past Medical History  Diagnosis Date  . Hydrocele   . Nephrolithiasis   . MI (myocardial infarction) 1997  . Bladder cancer    Past Surgical History  Procedure Laterality Date  . Angioplasty  [address]  . Bladder removed    . Hydrocele excision    . Colonoscopy  08/27/2011    normal.  Christian [name].  Marland Kitchen Prostate surgery      prostatectomy with bladder resection.    No Known Allergies Current Outpatient Prescriptions  Medication Sig Dispense Refill  . aspirin (ASPIR-LOW) 81 MG EC tablet Take 81 mg by mouth daily.        . clopidogrel (PLAVIX) 75 MG tablet Take 75 mg by mouth daily.        . nitroGLYCERIN (NITROSTAT) 0.4 MG SL tablet Place 1 tablet (0.4 mg total) under the tongue every 5 (five) minutes as needed for chest pain.  25 tablet  6  . Omega-3 Fatty Acids (RA FISH OIL) 1000 MG CAPS Take by mouth. Take 2 tabs in morning and 2 tabs i afternoon       . ramipril (ALTACE) 10 MG tablet Take 10 mg by mouth daily.        . simvastatin (ZOCOR) 40 MG tablet Take 1 tablet (40 mg total) by mouth daily.  90 tablet  3   No current facility-administered medications for this visit.   History   Social History  . Marital Status: Married    Spouse Name: N/A    Number of Children: N/A  . Years of Education: N/A   Occupational History  . retired    Social History Main Topics  . Smoking status: Never Smoker   . Smokeless tobacco: Not on file     Comment: tobacco use- no   . Alcohol Use: No  . Drug Use: No  . Sexual Activity: Not on file   Other Topics Concern  . Not on file   Social History Narrative   Marital status:  Married x 29 years; happily married; third marriage.      Children: none; 2 stepdaughters; 3 grandchildren.  3 gg.      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      Tobacco:   None      Alcohol:  None      Drugs: none     Exercise: Regularly exercise/walking.    Family History  Problem Relation Age of Onset  . Stroke Mother   . Diabetes Mother   . Lung cancer Father   . Cancer Father     lung cancer  . Diabetes Sister   . Diabetes Brother         Objective:    BP 130/70  Pulse 69  Temp(Src) 98.5 F (36.9 C) (Oral)  Resp 16  Ht 5\' 9"  (1.753 m)  Wt 290 lb 12.8 oz (131.906 kg)  BMI 42.92 kg/m2  SpO2 95%  Physical Exam  Nursing note and vitals reviewed. Constitutional: He is oriented to person, place, and time. He appears  well-developed and well-nourished. No distress.  obese  HENT:  Head: Normocephalic and atraumatic.  Right Ear: External ear normal.  Left Ear: External ear normal.  Nose: Nose normal.  Mouth/Throat: Oropharynx is clear and moist. No oropharyngeal exudate.  Eyes: Conjunctivae and EOM are normal. Pupils are equal, round, and reactive to light. Right eye exhibits no discharge. Left eye exhibits no discharge. No scleral icterus.  Neck: Normal range of motion. Neck supple. No JVD present. Carotid bruit is not present. No thyromegaly present.  Cardiovascular: Normal rate, regular rhythm, normal heart sounds and intact distal pulses.  Exam reveals no gallop and no friction rub.   No murmur heard. Pulmonary/Chest: Effort normal and breath sounds normal. No respiratory distress. He has no wheezes. He has no rales.  Abdominal: Soft. Bowel sounds are normal. He exhibits no distension and no mass. There is no tenderness. There is no rebound and no guarding.  Musculoskeletal: Normal range of motion. He exhibits no edema and no tenderness.  Lymphadenopathy:    He has no cervical adenopathy.  Neurological: He is alert and oriented to person, place, and time. He has normal reflexes. He displays normal reflexes. No cranial nerve deficit. Coordination normal.  Skin: Skin is warm and dry. Rash noted. He is not diaphoretic.  L anterior calf  with a 1 cm x 1.5 cm erythematous  scaling rash Similar smaller lesion on R anterior calf  Psychiatric: He has a normal mood and affect. His behavior is normal. Judgment and thought content normal.   Results for orders placed in visit on 06/02/13  CBC      Result Value Ref Range   WBC 9.1  3.4 - 10.8 x10E3/uL   RBC 4.46  4.14 - 5.80 x10E6/uL   Hemoglobin 14.7  12.6 - 17.7 g/dL   HCT 42.6  37.5 - 51.0 %   MCV 96  79 - 97 fL   MCH 33.0  26.6 - 33.0 pg   MCHC 34.5  31.5 - 35.7 g/dL   RDW 14.7  12.3 - 15.4 %   Platelets 273  150 - 379 x10E3/uL  COMPREHENSIVE METABOLIC PANEL      Result Value Ref Range   Glucose 96  65 - 99 mg/dL   BUN 18  8 - 27 mg/dL   Creatinine, Ser 0.90  0.76 - 1.27 mg/dL   GFR calc non Af Amer 86  >59 mL/min/1.73   GFR calc Af Amer 100  >59 mL/min/1.73   BUN/Creatinine Ratio 20  10 - 22   Sodium 141  134 - 144 mmol/L   Potassium 4.4  3.5 - 5.2 mmol/L   Chloride 100  97 - 108 mmol/L   CO2 26  18 - 29 mmol/L   Calcium 9.5  8.6 - 10.2 mg/dL   Total Protein 7.3  6.0 - 8.5 g/dL   Albumin 4.7  3.5 - 4.8 g/dL   Globulin, Total 2.6  1.5 - 4.5 g/dL   Albumin/Globulin Ratio 1.8  1.1 - 2.5   Total Bilirubin 0.3  0.0 - 1.2 mg/dL   Alkaline Phosphatase 51  39 - 117 IU/L   AST 38  0 - 40 IU/L   ALT 44  0 - 44 IU/L  LIPID PANEL      Result Value Ref Range   Cholesterol, Total 151  100 - 199 mg/dL   Triglycerides 302 (*) 0 - 149 mg/dL   HDL 34 (*) >39 mg/dL   VLDL Cholesterol Cal 60 (*) 5 -  40 mg/dL   LDL Calculated 57  0 - 99 mg/dL   Chol/HDL Ratio 4.4  0.0 - 5.0 ratio units  HEMOGLOBIN A1C      Result Value Ref Range   Hemoglobin A1C 6.1 (*) 4.8 - 5.6 %   Estimated average glucose 128    CK      Result Value Ref Range   Total CK 71  24 - 204 U/L       Assessment & Plan:  Routine general medical examination at a health care facility - Plan: CBC with Differential, COMPLETE METABOLIC PANEL WITH GFR, Hemoglobin A1c, Lipid panel  Essential hypertension,  benign  Other abnormal glucose - Plan: COMPLETE METABOLIC PANEL WITH GFR, Hemoglobin A1c  Pure hypercholesterolemia - Plan: Lipid panel  Special screening for malignant neoplasm of prostate - Plan: PSA, Medicare  Skin lesions - Plan: Ambulatory referral to Dermatology  1 Annual Wellness Exam Subsequent:  Anticipatory guidance --- weight loss, exercise.  Colonoscopy UTD.  Pt declined rx for Zostavax today; declined TDAP.  No hearing loss; independent with ADLs.  Low fall risk.  No evidence of depression. 2.  HTN: controlled; refill provided. 3.  Hypercholesterolemia: controlled; obtain labs; refill provided.  Goal LDL< 70 due to CAD/AMI hx.  4.  Prostate cancer screening: obtain PSA; fax to Dr. Jacqlyn Larsen; s/p prostatectomy with bladder resection. 5.  Skin lesions B anterior lower legs: New.  No improvement with topical steroid; refer to dermatology. 6.  CAD: stable; followed annually by cardiology; asymptomatic. 7. AAA: enlarged at last visit; followed every six months by Christian [name] of Vascular surgery. 8. Bladder cancer: stable; followed annually by Dr. Jacqlyn Larsen.     No orders of the defined types were placed in this encounter.    No Follow-up on file.   I personally performed the services described in this documentation, which was scribed in my presence. The recorded information has been reviewed and is accurate.   Reginia Forts, M.D.  [facility] [address] [place], [place]  [address] [phone] phone [phone] fax

## 2013-12-28 LAB — PSA, MEDICARE: PSA: 0.35 ng/mL (ref <=4.00)

## 2013-12-28 LAB — HEMOGLOBIN A1C
HEMOGLOBIN A1C: 5.8 % — AB (ref <5.7)
MEAN PLASMA GLUCOSE: 120 mg/dL — AB (ref <117)

## 2013-12-29 ENCOUNTER — Encounter: Payer: Self-pay | Admitting: Family Medicine

## 2014-03-09 ENCOUNTER — Ambulatory Visit: Payer: Medicare Other | Admitting: Family Medicine

## 2014-05-06 ENCOUNTER — Ambulatory Visit: Payer: PRIVATE HEALTH INSURANCE | Admitting: Cardiovascular Disease

## 2014-05-23 ENCOUNTER — Ambulatory Visit: Payer: PRIVATE HEALTH INSURANCE | Admitting: Cardiovascular Disease

## 2014-06-02 ENCOUNTER — Encounter: Payer: Self-pay | Admitting: Cardiovascular Disease

## 2014-06-02 ENCOUNTER — Ambulatory Visit (INDEPENDENT_AMBULATORY_CARE_PROVIDER_SITE_OTHER): Payer: PRIVATE HEALTH INSURANCE | Admitting: Cardiovascular Disease

## 2014-06-02 VITALS — BP 120/72 | HR 71 | Ht 71.0 in | Wt 289.2 lb

## 2014-06-02 DIAGNOSIS — R7309 Other abnormal glucose: Secondary | ICD-10-CM

## 2014-06-02 DIAGNOSIS — E785 Hyperlipidemia, unspecified: Secondary | ICD-10-CM

## 2014-06-02 DIAGNOSIS — I2510 Atherosclerotic heart disease of native coronary artery without angina pectoris: Secondary | ICD-10-CM

## 2014-06-02 DIAGNOSIS — R7303 Prediabetes: Secondary | ICD-10-CM

## 2014-06-02 NOTE — Patient Instructions (Signed)
You are doing well. No medication changes were made.  Please call us if you have new issues that need to be addressed before your next appt.  Your physician wants you to follow-up in: 12 months.  You will receive a reminder letter in the mail two months in advance. If you don't receive a letter, please call our office to schedule the follow-up appointment. 

## 2014-06-02 NOTE — Progress Notes (Signed)
Patient ID: Christian [name], male    DOB: Jul 04, 1941, 73 y.o.   MRN: [id number]  HPI Comments: Christian [name] is a very pleasant 73 year old gentleman with a history of  obesity , CAD, moderate disease in a diagonal vessel, RCA and proximal circumflex, obtuse marginal branch with normal systolic function, diastolic dysfunction on echocardiogram, history of DVT and PE, Bladder surgery, history of obstructive sleep apnea, hypertension, hyperlipidemia, small AAA,  who presents for routine followup.   Reports that overall he has been feeling well.   He has been slowly losing weight, down 6 pounds from his prior clinic visit He has been active, taking care of his wife who has had recent knee surgery No symptoms of shortness of breath or chest pain with exertion Hemoglobin A1c 5.8 Total cholesterol 143, LDL 70   Last stress test was in 03/2008. He had an inferior wall defect consistent with ischemia. Significant GI uptake artifact noted in the inferior wall. EF 42%. No cardiac cath was performed at that time in follow up.   ECHO in 02/2008 shows normal E, otherwise normal study  EKG  shows: NSR heart rate 71 beats per minute with nonspecific ST and T wave abnormality in the anterior precordial leads, 3 and aVF     Outpatient Encounter Prescriptions as of 06/02/2014  Medication Sig  . aspirin (ASPIR-LOW) 81 MG EC tablet Take 81 mg by mouth daily.    . clopidogrel (PLAVIX) 75 MG tablet Take 1 tablet (75 mg total) by mouth daily.  . nitroGLYCERIN (NITROSTAT) 0.4 MG SL tablet Place 1 tablet (0.4 mg total) under the tongue every 5 (five) minutes as needed for chest pain.  . Omega-3 Fatty Acids (RA FISH OIL) 1000 MG CAPS Take by mouth. Take 2 tabs in morning and 2 tabs i afternoon   . ramipril (ALTACE) 10 MG capsule Take 1 capsule (10 mg total) by mouth daily.  . simvastatin (ZOCOR) 40 MG tablet Take 1 tablet (40 mg total) by mouth daily.    Review of Systems  Constitutional: Negative.   HENT: Negative.    Eyes: Negative.   Respiratory: Negative.   Cardiovascular: Negative.   Gastrointestinal: Negative.   Endocrine: Negative.   Musculoskeletal: Negative.   Skin: Negative.   Allergic/Immunologic: Negative.   Neurological: Negative.   Hematological: Negative.   Psychiatric/Behavioral: Negative.   All other systems reviewed and are negative.   BP 120/72  Pulse 71  Ht 5\' 11"  (1.803 m)  Wt 289 lb 4 oz (131.203 kg)  BMI 40.36 kg/m2  Physical Exam  Nursing note and vitals reviewed. Constitutional: He is oriented to person, place, and time. He appears well-developed and well-nourished.  Obese  HENT:  Head: Normocephalic.  Nose: Nose normal.  Mouth/Throat: Oropharynx is clear and moist.  Eyes: Conjunctivae are normal. Pupils are equal, round, and reactive to light.  Neck: Normal range of motion. Neck supple. No JVD present.  Cardiovascular: Normal rate, regular rhythm, S1 normal, S2 normal, normal heart sounds and intact distal pulses.  Exam reveals no gallop and no friction rub.   No murmur heard. Pulmonary/Chest: Effort normal and breath sounds normal. No respiratory distress. He has no wheezes. He has no rales. He exhibits no tenderness.  Abdominal: Soft. Bowel sounds are normal. He exhibits no distension. There is no tenderness.  Musculoskeletal: Normal range of motion. He exhibits no edema and no tenderness.  Lymphadenopathy:    He has no cervical adenopathy.  Neurological: He is alert and oriented to  person, place, and time. Coordination normal.  Skin: Skin is warm and dry. No rash noted. No erythema.  Psychiatric: He has a normal mood and affect. His behavior is normal. Judgment and thought content normal.      Assessment and Plan

## 2014-06-03 DIAGNOSIS — R7303 Prediabetes: Secondary | ICD-10-CM | POA: Insufficient documentation

## 2014-06-03 NOTE — Assessment & Plan Note (Signed)
Very encouraging that his hemoglobin A1c is trending down, now 5.8

## 2014-06-03 NOTE — Assessment & Plan Note (Signed)
Currently with no symptoms of angina. No further workup at this time. Continue current medication regimen. 

## 2014-06-03 NOTE — Assessment & Plan Note (Signed)
Cholesterol is at goal on the current lipid regimen. No changes to the medications were made.  

## 2014-06-03 NOTE — Assessment & Plan Note (Signed)
Encouraged that his weight is down 6 pounds. Suggested he continue exercise with diet modification

## 2014-06-28 ENCOUNTER — Encounter: Payer: Self-pay | Admitting: Family Medicine

## 2014-06-28 DIAGNOSIS — G4733 Obstructive sleep apnea (adult) (pediatric): Secondary | ICD-10-CM

## 2014-06-28 DIAGNOSIS — Z9989 Dependence on other enabling machines and devices: Principal | ICD-10-CM

## 2014-07-17 ENCOUNTER — Ambulatory Visit: Payer: Self-pay | Admitting: Family Medicine

## 2014-08-02 ENCOUNTER — Telehealth: Payer: Self-pay

## 2014-08-02 NOTE — Telephone Encounter (Signed)
Patient was seen at the sleep lab At [facility] [phone]) about 2 weeks ago. Per patient the sleep lab told him their office had already faxed over the results. Patient is trying to get another cpap machine. Patient is requesting our office to send over a prescription to the sleep lab in order for him to get another one. Patients call back number is [phone]

## 2014-08-05 NOTE — Telephone Encounter (Signed)
Order form for CPAP was signed by Dr. Tamala Julian and faxed on Dec 9. Form is in scan box to be sent for scanning.

## 2014-08-08 ENCOUNTER — Telehealth: Payer: Self-pay

## 2014-08-08 NOTE — Telephone Encounter (Signed)
Called patient to find out it he had a flu shot yet.  He states that he has not, but will get it at his next appointment with Dr. Tamala Julian.

## 2014-08-17 ENCOUNTER — Ambulatory Visit: Payer: Self-pay | Admitting: Family Medicine

## 2014-08-24 ENCOUNTER — Encounter: Payer: Self-pay | Admitting: Family Medicine

## 2014-08-24 ENCOUNTER — Ambulatory Visit (INDEPENDENT_AMBULATORY_CARE_PROVIDER_SITE_OTHER): Payer: Medicare Other | Admitting: Family Medicine

## 2014-08-24 ENCOUNTER — Ambulatory Visit: Payer: Medicare Other

## 2014-08-24 VITALS — BP 148/82 | HR 86 | Temp 98.5°F | Resp 16 | Ht 69.0 in | Wt 290.4 lb

## 2014-08-24 DIAGNOSIS — J988 Other specified respiratory disorders: Secondary | ICD-10-CM

## 2014-08-24 DIAGNOSIS — R05 Cough: Secondary | ICD-10-CM

## 2014-08-24 DIAGNOSIS — R7302 Impaired glucose tolerance (oral): Secondary | ICD-10-CM

## 2014-08-24 DIAGNOSIS — E785 Hyperlipidemia, unspecified: Secondary | ICD-10-CM

## 2014-08-24 DIAGNOSIS — R059 Cough, unspecified: Secondary | ICD-10-CM

## 2014-08-24 DIAGNOSIS — J9801 Acute bronchospasm: Secondary | ICD-10-CM

## 2014-08-24 DIAGNOSIS — I10 Essential (primary) hypertension: Secondary | ICD-10-CM

## 2014-08-24 DIAGNOSIS — J22 Unspecified acute lower respiratory infection: Secondary | ICD-10-CM

## 2014-08-24 MED ORDER — GUAIFENESIN-CODEINE 100-10 MG/5ML PO SOLN: 5.0000 mL | ORAL | Status: DC | PRN

## 2014-08-24 MED ORDER — AZITHROMYCIN 250 MG PO TABS: ORAL_TABLET | ORAL | Status: DC

## 2014-08-24 MED ORDER — PREDNISONE 20 MG PO TABS: ORAL_TABLET | ORAL | Status: DC

## 2014-08-24 NOTE — Progress Notes (Addendum)
Subjective:    Patient ID: Christian [name], male    DOB: 11/07/1940, 73 y.o.   MRN: [id number]  08/24/2014  Follow-up   HPI This 73 y.o. male presents for six month follow-up:  1.  HTN:  Patient reports good compliance with medication, good tolerance to medication, and good symptom control.  No changes to management made at last visit. Denies CP/palp/SOB/leg swelling.  2. Glucose Intlerance:  Continues to monitor sugar intake.  No recent issues.  3.  Hyperlipidemia:  Patient reports good compliance with medication, good tolerance to medication, and good symptom control.  No changes to management made at last visit. Denies HA/dizziness/focal weakness/paresthesias.  4.  Cough: onset three weeks ago.  Coughing horribly.  No recent fever/chills/sweats.  +fatigue.  HA secondary to cough. No ear pain or sore throat.  +rhinorrhea; +nasal congestion; +PND.  +horrible cough; +wheezing; no SOB.  No v/d.    5. CAD: stable; s/p follow-up with cardiology this month; no changes to management made.  6. Bladder cancer: stable; follows up with urology yearly.  No recent issues.  7. OSA: s/p CPAP titration; awaiting order to be sent to PCP for mask.  Levels/pressure did not change from last sleep study.   Review of Systems  Constitutional: Negative for fever, chills, diaphoresis, activity change, appetite change and fatigue.  HENT: Positive for congestion, rhinorrhea, sinus pressure and voice change. Negative for ear pain, sore throat and trouble swallowing.   Respiratory: Positive for cough and wheezing. Negative for shortness of breath.   Cardiovascular: Negative for chest pain, palpitations and leg swelling.  Gastrointestinal: Negative for nausea, vomiting, abdominal pain and diarrhea.  Endocrine: Negative for cold intolerance, heat intolerance, polydipsia, polyphagia and polyuria.  Skin: Negative for color change, rash and wound.  Neurological: Negative for dizziness, tremors, seizures, syncope,  facial asymmetry, speech difficulty, weakness, light-headedness, numbness and headaches.  Psychiatric/Behavioral: Negative for sleep disturbance and dysphoric mood. The patient is not nervous/anxious.     Past Medical History  Diagnosis Date  . Hydrocele   . Nephrolithiasis   . MI (myocardial infarction) 1997  . Aortic aneurysm     followed by vascular surgery yearly.  . Bladder cancer     s/p bladder resection. [facility]  . DVT (deep venous thrombosis) 10/07/2007  . Coronary artery disease     AMI anterior wall 1997; s/p PTCA to LAD 11/1995 [facility].  . Sleep apnea   . Glucose intolerance (impaired glucose tolerance)   . Hyperlipidemia   . Hypertension    Past Surgical History  Procedure Laterality Date  . Angioplasty  [address]  . Bladder removed    . Hydrocele excision    . Colonoscopy  08/27/2011    normal.  [facility].  Marland Kitchen Prostate surgery      prostatectomy with bladder resection.  . Cardiac catheterization     No Known Allergies      Objective:    BP 148/82 mmHg  Pulse 86  Temp(Src) 98.5 F (36.9 C) (Oral)  Resp 16  Ht 5\' 9"  (1.753 m)  Wt 290 lb 6.4 oz (131.725 kg)  BMI 42.87 kg/m2  SpO2 93% Physical Exam  Constitutional: He is oriented to person, place, and time. He appears well-developed and well-nourished. No distress.  HENT:  Head: Normocephalic and atraumatic.  Right Ear: External ear normal.  Left Ear: External ear normal.  Nose: Nose normal.  Mouth/Throat: Oropharynx is clear and moist.  Eyes: Conjunctivae and EOM are  normal. Pupils are equal, round, and reactive to light.  Neck: Normal range of motion. Neck supple. Carotid bruit is not present. No thyromegaly present.  Cardiovascular: Normal rate, regular rhythm, normal heart sounds and intact distal pulses.  Exam reveals no gallop and no friction rub.   No murmur heard. Pulmonary/Chest: Effort normal. No respiratory distress. He has wheezes. He has no rales.  Abdominal: Soft. Bowel  sounds are normal. He exhibits no distension and no mass. There is no tenderness. There is no rebound and no guarding.  Lymphadenopathy:    He has no cervical adenopathy.  Neurological: He is alert and oriented to person, place, and time. No cranial nerve deficit.  Skin: Skin is warm and dry. No rash noted. He is not diaphoretic.  Psychiatric: He has a normal mood and affect. His behavior is normal.  Nursing note and vitals reviewed.  [facility] reading (PRIMARY) by  Dr. Tamala Julian.  CXR: NAD  ALBUTEROL NEBULIZER ADMINISTERED IN OFFICE.     Assessment & Plan:   1. Essential hypertension, benign   2. Glucose intolerance (impaired glucose tolerance)   3. Elevated lipids   4. Cough   5. Lower respiratory infection   6. Bronchospasm      1. HTN:  Controlled; obtain labs; continue current medications. 2.  Glucose Intolerance: stable; continue with dietary modification. 3. Hyperlipidemia: controlled; obtain labs; continue current medications. 4.  Lower respiratory infection: New. Rx for Zithromax, prednisone, Robitussin with codeine provided. RTC for worsening symptoms. 5. CAD: stable; s/p recent follow-up with cardiology without change to management. 6.  Bladder cancer: stable; followed by urology yearly. 7. OSA: stable; await order from sleep study facility to provide renewed rx.   Meds ordered this encounter  Medications  . azithromycin (ZITHROMAX) 250 MG tablet    Sig: Two tablets daily x 5 days    Dispense:  10 tablet    Refill:  0  . predniSONE (DELTASONE) 20 MG tablet    Sig: Three tablets daily x 2 days then two tablets daily x 5 days then one tablet daily x 3 days    Dispense:  19 tablet    Refill:  0  . guaiFENesin-codeine 100-10 MG/5ML syrup    Sig: Take 5 mLs by mouth every 4 (four) hours as needed for cough.    Dispense:  120 mL    Refill:  0    Return in about 6 months (around 02/23/2015) for complete physical examiniation.    Zipporah Finamore Elayne Guerin, M.D. [facility] [address] [place], [place]  [address] [phone] phone [phone] fax

## 2014-08-25 LAB — COMPLETE METABOLIC PANEL WITH GFR
ALBUMIN: 4.5 g/dL (ref 3.5–5.2)
ALT: 56 U/L — ABNORMAL HIGH (ref 0–53)
AST: 44 U/L — AB (ref 0–37)
Alkaline Phosphatase: 47 U/L (ref 39–117)
BUN: 12 mg/dL (ref 6–23)
CHLORIDE: 99 meq/L (ref 96–112)
CO2: 27 meq/L (ref 19–32)
Calcium: 9.4 mg/dL (ref 8.4–10.5)
Creat: 0.94 mg/dL (ref 0.50–1.35)
GFR, Est African American: >89 mL/min
GFR, Est Non African American: 81 mL/min
Glucose, Bld: 94 mg/dL (ref 70–99)
POTASSIUM: 4.4 meq/L (ref 3.5–5.3)
Sodium: 138 mEq/L (ref 135–145)
TOTAL PROTEIN: 8.1 g/dL (ref 6.0–8.3)
Total Bilirubin: 0.5 mg/dL (ref 0.2–1.2)

## 2014-08-25 LAB — CBC WITH DIFFERENTIAL/PLATELET
BASOS PCT: 0 % (ref 0–1)
Basophils Absolute: 0 10*3/uL (ref 0.0–0.1)
EOS PCT: 4 % (ref 0–5)
Eosinophils Absolute: 0.3 10*3/uL (ref 0.0–0.7)
HCT: 45.6 % (ref 39.0–52.0)
Hemoglobin: 15.4 g/dL (ref 13.0–17.0)
LYMPHS PCT: 34 % (ref 12–46)
Lymphs Abs: 2.9 10*3/uL (ref 0.7–4.0)
MCH: 33 pg (ref 26.0–34.0)
MCHC: 33.8 g/dL (ref 30.0–36.0)
MCV: 97.9 fL (ref 78.0–100.0)
MONO ABS: 0.9 10*3/uL (ref 0.1–1.0)
MPV: 10.6 fL (ref 8.6–12.4)
Monocytes Relative: 10 % (ref 3–12)
Neutro Abs: 4.4 10*3/uL (ref 1.7–7.7)
Neutrophils Relative %: 52 % (ref 43–77)
PLATELETS: 275 10*3/uL (ref 150–400)
RBC: 4.66 MIL/uL (ref 4.22–5.81)
RDW: 14.8 % (ref 11.5–15.5)
WBC: 8.5 10*3/uL (ref 4.0–10.5)

## 2014-08-25 LAB — LIPID PANEL
CHOLESTEROL: 144 mg/dL (ref 0–200)
HDL: 37 mg/dL — ABNORMAL LOW (ref >39)
LDL Cholesterol: 74 mg/dL (ref 0–99)
TRIGLYCERIDES: 166 mg/dL — AB (ref <150)
Total CHOL/HDL Ratio: 3.9 Ratio
VLDL: 33 mg/dL (ref 0–40)

## 2014-08-25 LAB — HEMOGLOBIN A1C
Hgb A1c MFr Bld: 5.8 % — ABNORMAL HIGH (ref <5.7)
Mean Plasma Glucose: 120 mg/dL — ABNORMAL HIGH (ref <117)

## 2014-08-27 MED ORDER — SIMVASTATIN 40 MG PO TABS: 40.0000 mg | ORAL_TABLET | Freq: Every day | ORAL | Status: DC

## 2014-08-27 MED ORDER — RAMIPRIL 10 MG PO CAPS: 10.0000 mg | ORAL_CAPSULE | Freq: Every day | ORAL | Status: DC

## 2014-08-27 NOTE — Addendum Note (Signed)
Addended by: [name] on: 08/27/2014 11:59 AM   Modules accepted: Orders

## 2014-09-22 ENCOUNTER — Telehealth: Payer: Self-pay

## 2014-09-22 NOTE — Telephone Encounter (Signed)
Anderson Malta from [facility] is calling on behalf of patient is regaurds to C Pap pressure. Please call Anderson Malta at [phone]

## 2014-09-23 ENCOUNTER — Encounter: Payer: Self-pay | Admitting: Family Medicine

## 2014-09-23 NOTE — Telephone Encounter (Signed)
LMOM to CB. 

## 2014-09-26 NOTE — Telephone Encounter (Signed)
Left message for pt to call back  °

## 2014-09-26 NOTE — Telephone Encounter (Signed)
Spoke with Anderson Malta at [facility]. She states pt is wanting to change his pressure and actually tried someone else's CPAP machine at 13. He didn't like the pressure at 13 so Anderson Malta looked through his sleep study and saw that he did ok on his pressure being at 8 but didn't sleep that long. Anderson Malta needs [place] to fill out a new Rx to set his pressure to 8 and she will monitor him from there. I am awaiting fax.

## 2014-09-27 NOTE — Telephone Encounter (Signed)
I received the paperwork, placed in Dr. Thompson Caul box.

## 2014-10-11 NOTE — Telephone Encounter (Signed)
Please call to confirm that [facility] has received my order.

## 2014-10-11 NOTE — Telephone Encounter (Signed)
Have signed forms twice for 8cm pressure and sent for faxing.

## 2014-10-12 NOTE — Telephone Encounter (Signed)
Left a message to have someone call me to confirm the order was received.

## 2014-12-05 ENCOUNTER — Encounter: Payer: Self-pay | Admitting: Family Medicine

## 2015-03-01 ENCOUNTER — Encounter: Payer: Self-pay | Admitting: Family Medicine

## 2015-03-01 ENCOUNTER — Ambulatory Visit (INDEPENDENT_AMBULATORY_CARE_PROVIDER_SITE_OTHER): Payer: Medicare Other | Admitting: Family Medicine

## 2015-03-01 ENCOUNTER — Other Ambulatory Visit: Payer: Self-pay | Admitting: Family Medicine

## 2015-03-01 VITALS — BP 169/79 | HR 70 | Temp 98.0°F | Resp 16 | Ht 69.25 in | Wt 291.8 lb

## 2015-03-01 DIAGNOSIS — I714 Abdominal aortic aneurysm, without rupture, unspecified: Secondary | ICD-10-CM

## 2015-03-01 DIAGNOSIS — Z0000 Encounter for general adult medical examination without abnormal findings: Secondary | ICD-10-CM | POA: Diagnosis not present

## 2015-03-01 DIAGNOSIS — I10 Essential (primary) hypertension: Secondary | ICD-10-CM | POA: Diagnosis not present

## 2015-03-01 DIAGNOSIS — R945 Abnormal results of liver function studies: Secondary | ICD-10-CM

## 2015-03-01 DIAGNOSIS — I2510 Atherosclerotic heart disease of native coronary artery without angina pectoris: Secondary | ICD-10-CM | POA: Diagnosis not present

## 2015-03-01 DIAGNOSIS — R7302 Impaired glucose tolerance (oral): Secondary | ICD-10-CM

## 2015-03-01 DIAGNOSIS — R7989 Other specified abnormal findings of blood chemistry: Secondary | ICD-10-CM

## 2015-03-01 DIAGNOSIS — C679 Malignant neoplasm of bladder, unspecified: Secondary | ICD-10-CM | POA: Diagnosis not present

## 2015-03-01 DIAGNOSIS — E785 Hyperlipidemia, unspecified: Secondary | ICD-10-CM

## 2015-03-01 LAB — CBC WITH DIFFERENTIAL/PLATELET
BASOS ABS: 0 10*3/uL (ref 0.0–0.1)
Basophils Relative: 0 % (ref 0–1)
Eosinophils Absolute: 0.3 10*3/uL (ref 0.0–0.7)
Eosinophils Relative: 4 % (ref 0–5)
HCT: 41.7 % (ref 39.0–52.0)
Hemoglobin: 14.3 g/dL (ref 13.0–17.0)
LYMPHS ABS: 2.3 10*3/uL (ref 0.7–4.0)
LYMPHS PCT: 30 % (ref 12–46)
MCH: 32.6 pg (ref 26.0–34.0)
MCHC: 34.3 g/dL (ref 30.0–36.0)
MCV: 95 fL (ref 78.0–100.0)
MONO ABS: 0.7 10*3/uL (ref 0.1–1.0)
MPV: 10.1 fL (ref 8.6–12.4)
Monocytes Relative: 9 % (ref 3–12)
NEUTROS ABS: 4.4 10*3/uL (ref 1.7–7.7)
Neutrophils Relative %: 57 % (ref 43–77)
PLATELETS: 250 10*3/uL (ref 150–400)
RBC: 4.39 MIL/uL (ref 4.22–5.81)
RDW: 14.9 % (ref 11.5–15.5)
WBC: 7.8 10*3/uL (ref 4.0–10.5)

## 2015-03-01 LAB — MICROALBUMIN, URINE: MICROALB UR: 0.5 mg/dL (ref <2.0)

## 2015-03-01 LAB — COMPREHENSIVE METABOLIC PANEL
ALT: 54 U/L — AB (ref 0–53)
AST: 46 U/L — ABNORMAL HIGH (ref 0–37)
Albumin: 4.3 g/dL (ref 3.5–5.2)
Alkaline Phosphatase: 45 U/L (ref 39–117)
BUN: 12 mg/dL (ref 6–23)
CO2: 26 mEq/L (ref 19–32)
Calcium: 9.3 mg/dL (ref 8.4–10.5)
Chloride: 99 mEq/L (ref 96–112)
Creat: 0.84 mg/dL (ref 0.50–1.35)
Glucose, Bld: 100 mg/dL — ABNORMAL HIGH (ref 70–99)
Potassium: 4 mEq/L (ref 3.5–5.3)
SODIUM: 136 meq/L (ref 135–145)
Total Bilirubin: 0.6 mg/dL (ref 0.2–1.2)
Total Protein: 7.5 g/dL (ref 6.0–8.3)

## 2015-03-01 LAB — HEMOGLOBIN A1C
Hgb A1c MFr Bld: 6 % — ABNORMAL HIGH (ref <5.7)
Mean Plasma Glucose: 126 mg/dL — ABNORMAL HIGH (ref <117)

## 2015-03-01 LAB — POCT URINALYSIS DIPSTICK
Bilirubin, UA: NEGATIVE
Glucose, UA: NEGATIVE
Ketones, UA: NEGATIVE
Nitrite, UA: NEGATIVE
PROTEIN UA: NEGATIVE
Urobilinogen, UA: 0.2
pH, UA: 5.5

## 2015-03-01 LAB — LIPID PANEL
Cholesterol: 156 mg/dL (ref 0–200)
HDL: 37 mg/dL — ABNORMAL LOW (ref >=40)
LDL Cholesterol: 64 mg/dL (ref 0–99)
TRIGLYCERIDES: 277 mg/dL — AB (ref <150)
Total CHOL/HDL Ratio: 4.2 Ratio
VLDL: 55 mg/dL — ABNORMAL HIGH (ref 0–40)

## 2015-03-01 MED ORDER — RAMIPRIL 10 MG PO CAPS: 10.0000 mg | ORAL_CAPSULE | Freq: Every day | ORAL | Status: DC

## 2015-03-01 MED ORDER — CLOPIDOGREL BISULFATE 75 MG PO TABS: 75.0000 mg | ORAL_TABLET | Freq: Every day | ORAL | Status: DC

## 2015-03-01 MED ORDER — SIMVASTATIN 40 MG PO TABS: 40.0000 mg | ORAL_TABLET | Freq: Every day | ORAL | Status: DC

## 2015-03-01 NOTE — Progress Notes (Signed)
Subjective:    Patient ID: Christian [name], male    DOB: 03-Mar-1941, 74 y.o.   MRN: [id number]  03/01/2015  Annual Exam; Hypertension; Hyperlipidemia; and Coronary Artery Disease   HPI This 74 y.o. male presents for Annual Wellness Examination.  Last physical:  12-27-2013 Colonoscopy:  08-03-2012 TDAP: not sure Pneumovax:  03-09-2008 Zostavax:  Never; no previous chicken pox.   Influenza:   Eye exam: Dental exam:  Hypertension: Patient reports good compliance with medication, good tolerance to medication, and good symptom control.  Denies CP/palp/SOB/leg swelling.   Hyperlipidemia:  Patient reports good compliance with medication, good tolerance to medication, and good symptom control.  Denies HA/dizziness/focal weakness/paresthesias.   Glucose Intolerance:  Monitoring sugars closely.  CAD: followed annually by cardiology; denies CP/palp/SOB/leg swelling.  History of bladder cancer:  Scheduled to see Dr.Cope tomorrow.  To undergo rectal exam, PET scan, CXR, ultrasound of kidneys 1:00-2:30pm.  Will obtain PSA.  Eleven years out from surgery and treatment.  OSA: s/p repeat CPAP titration in 07/2014.  Pressure of 13 cm recommended.  Aortic aneurysm: s/p recent follow-up with Dew; AAA 3.5; will not operate until reaches 5.0cm.  Followed annually.  Leotis Pain.  L 3rd and 4th finger pain:  Onset two weeks ago.  Taking Aleve bid; no swelling; no injury.   Review of Systems  Constitutional: Negative for fever, chills, diaphoresis, activity change, appetite change, fatigue and unexpected weight change.  HENT: Negative for congestion, dental problem, drooling, ear discharge, ear pain, facial swelling, hearing loss, mouth sores, nosebleeds, postnasal drip, rhinorrhea, sinus pressure, sneezing, sore throat, tinnitus, trouble swallowing and voice change.   Eyes: Negative for photophobia, pain, discharge, redness, itching and visual disturbance.  Respiratory: Negative for apnea, cough,  choking, chest tightness, shortness of breath, wheezing and stridor.   Cardiovascular: Negative for chest pain, palpitations and leg swelling.  Gastrointestinal: Negative for nausea, vomiting, abdominal pain, diarrhea, constipation and blood in stool.  Endocrine: Negative for cold intolerance, heat intolerance, polydipsia, polyphagia and polyuria.  Genitourinary: Negative for dysuria, urgency, frequency, hematuria, flank pain, decreased urine volume, discharge, penile swelling, scrotal swelling, enuresis, difficulty urinating, genital sores, penile pain and testicular pain.  Musculoskeletal: Positive for arthralgias. Negative for myalgias, back pain, joint swelling, gait problem, neck pain and neck stiffness.  Skin: Negative for color change, pallor, rash and wound.  Allergic/Immunologic: Negative for environmental allergies, food allergies and immunocompromised state.  Neurological: Negative for dizziness, tremors, seizures, syncope, facial asymmetry, speech difficulty, weakness, light-headedness, numbness and headaches.  Hematological: Negative for adenopathy. Does not bruise/bleed easily.  Psychiatric/Behavioral: Negative for suicidal ideas, hallucinations, behavioral problems, confusion, sleep disturbance, self-injury, dysphoric mood, decreased concentration and agitation. The patient is not nervous/anxious and is not hyperactive.     Past Medical History  Diagnosis Date  . Hydrocele   . Nephrolithiasis   . MI (myocardial infarction) 1997  . Aortic aneurysm     followed by vascular surgery yearly.  . Bladder cancer     s/p bladder resection. Cope  . DVT (deep venous thrombosis) 10/07/2007  . Coronary artery disease     AMI anterior wall 1997; s/p PTCA to LAD 11/1995 [facility].  . Sleep apnea   . Glucose intolerance (impaired glucose tolerance)   . Hyperlipidemia   . Hypertension    Past Surgical History  Procedure Laterality Date  . Angioplasty  [address]  . Bladder removed      . Hydrocele excision    . Colonoscopy  08/27/2011    normal.  [facility].  Marland Kitchen Prostate surgery      prostatectomy with bladder resection.  . Cardiac catheterization     No Known Allergies Current Outpatient Prescriptions  Medication Sig Dispense Refill  . aspirin (ASPIR-LOW) 81 MG EC tablet Take 81 mg by mouth daily.      . clopidogrel (PLAVIX) 75 MG tablet Take 1 tablet (75 mg total) by mouth daily. 90 tablet 3  . nitroGLYCERIN (NITROSTAT) 0.4 MG SL tablet Place 1 tablet (0.4 mg total) under the tongue every 5 (five) minutes as needed for chest pain. 25 tablet 6  . Omega-3 Fatty Acids (RA FISH OIL) 1000 MG CAPS Take by mouth. Take 2 tabs in morning and 2 tabs i afternoon     . ramipril (ALTACE) 10 MG capsule Take 1 capsule (10 mg total) by mouth daily. 90 capsule 3  . simvastatin (ZOCOR) 40 MG tablet Take 1 tablet (40 mg total) by mouth daily. 90 tablet 3   No current facility-administered medications for this visit.   History   Social History  . Marital Status: Married    Spouse Name: N/A  . Number of Children: N/A  . Years of Education: N/A   Occupational History  . retired    Social History Main Topics  . Smoking status: Never Smoker   . Smokeless tobacco: Not on file     Comment: tobacco use- no   . Alcohol Use: No  . Drug Use: No  . Sexual Activity: Yes   Other Topics Concern  . Not on file   Social History Narrative   Marital status:  Married x 30 years; happily married; third marriage.      Children: none; 2 stepdaughters; 3 grandchildren.  3 gg.      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      Tobacco:  None      Alcohol:  None      Drugs: none     Exercise: Regularly exercise/walking; walking daily.      ADLs: independent with ADLs.        Advanced Directives:  Yes; FULL CODE.  No prolonged measures.      Seatbelt: 100%         Family History  Problem Relation Age of Onset  . Stroke Mother   . Diabetes Mother   . Lung  cancer Father   . Cancer Father     lung cancer  . Diabetes Sister   . Diabetes Brother        Objective:    BP 169/79 mmHg  Pulse 70  Temp(Src) 98 F (36.7 C) (Oral)  Resp 16  Ht 5' 9.25" (1.759 m)  Wt 291 lb 12.8 oz (132.36 kg)  BMI 42.78 kg/m2  SpO2 92% Physical Exam  Constitutional: He is oriented to person, place, and time. He appears well-developed and well-nourished. No distress.  obese  HENT:  Head: Normocephalic and atraumatic.  Right Ear: External ear normal.  Left Ear: External ear normal.  Nose: Nose normal.  Mouth/Throat: Oropharynx is clear and moist.  Eyes: Conjunctivae and EOM are normal. Pupils are equal, round, and reactive to light.  Neck: Normal range of motion. Neck supple. Carotid bruit is not present. No thyromegaly present.  Cardiovascular: Normal rate, regular rhythm, normal heart sounds and intact distal pulses.  Exam reveals no gallop and no friction rub.   No murmur heard. Pulmonary/Chest: Effort normal and breath sounds  normal. He has no wheezes. He has no rales.  Abdominal: Soft. Bowel sounds are normal. He exhibits no distension and no mass. There is no tenderness. There is no rebound and no guarding.  Bag in RLQ.  Musculoskeletal:       Right shoulder: Normal.       Left shoulder: Normal.       Cervical back: Normal.  Lymphadenopathy:    He has no cervical adenopathy.  Neurological: He is alert and oriented to person, place, and time. He has normal reflexes. No cranial nerve deficit. He exhibits normal muscle tone. Coordination normal.  Skin: Skin is warm and dry. No rash noted. He is not diaphoretic.  Psychiatric: He has a normal mood and affect. His behavior is normal. Judgment and thought content normal.        Assessment & Plan:   1. Encounter for Medicare annual wellness exam   2. Hyperlipidemia   3. Glucose intolerance (impaired glucose tolerance)   4. Essential hypertension   5. Atherosclerosis of native coronary artery  without angina pectoris, unspecified whether native or transplanted heart   6. Abdominal aortic aneurysm   7. Malignant neoplasm of urinary bladder, unspecified site     1. Annual Wellness Examination: anticipatory guidance --- exercise, weight loss.  Colonoscopy UTD.  Reviewed immunizations --- pt to receive TDAP and Prevnar 13 at next visit.  No hearing loss. Low risk for falls.  Independent with ADLs.  Has advanced directives; desires FULL CODE but no prolonged measures. 2.  Hyperlipidemia: controlled; obtain labs; refills provided. 3.  HTN: uncontrolled today; if remains elevated at next visit, will need adjustments to medications. 4.  Glucose intolerance: stable; obtain labs; recommend weight loss, exercise, low carbohydrate and low sugar food choices. 5.  CAD: stable/asymptomatic; followed by cardiology annually. 6. Bladder cancer: stable; followed by Cope/urology annually. 7.  Abdominal aortic aneurysm: stable; s/p follow up with Leotis Pain of vascular surgery; followed annually.   Meds ordered this encounter  Medications  . ramipril (ALTACE) 10 MG capsule    Sig: Take 1 capsule (10 mg total) by mouth daily.    Dispense:  90 capsule    Refill:  3  . clopidogrel (PLAVIX) 75 MG tablet    Sig: Take 1 tablet (75 mg total) by mouth daily.    Dispense:  90 tablet    Refill:  3  . simvastatin (ZOCOR) 40 MG tablet    Sig: Take 1 tablet (40 mg total) by mouth daily.    Dispense:  90 tablet    Refill:  3    Return in about 6 months (around 09/01/2015) for recheck hypertension, hyperlipidemia, OSA, high blood sugar.     Shanora Christensen Elayne Guerin, M.D. [facility] [address] [place], [place]  [address] [phone] phone [phone] fax

## 2015-03-01 NOTE — Patient Instructions (Signed)

## 2015-03-02 ENCOUNTER — Telehealth: Payer: Self-pay

## 2015-03-02 LAB — HEPATITIS PANEL, ACUTE
HCV AB: NEGATIVE
HEP B C IGM: NONREACTIVE
HEP B S AG: NEGATIVE
Hep A IgM: NONREACTIVE

## 2015-03-02 LAB — FERRITIN: Ferritin: 196 ng/mL (ref 22–322)

## 2015-03-02 LAB — CK: Total CK: 47 U/L (ref 7–232)

## 2015-03-02 LAB — IRON: Iron: 93 ug/dL (ref 42–165)

## 2015-03-02 NOTE — Addendum Note (Signed)
Addended by: [name] on: 03/02/2015 09:03 AM   Modules accepted: Orders

## 2015-03-02 NOTE — Telephone Encounter (Signed)
Dr. Tamala Julian, this patient was seen yesterday by you and was called regarding an abdominal ultrasound. He states he was seen by Dr. Jacqlyn Larsen today and the testing was done so he cancelled the appt for the ultrasound. He needs clarification and wants to know if the ultrasound appt needs to be rescheduled. Cb# [phone].

## 2015-03-03 ENCOUNTER — Encounter: Payer: Self-pay | Admitting: Family Medicine

## 2015-03-03 NOTE — Telephone Encounter (Signed)
I sent him a message in [place]. His liver function tests are still elevated thus I recommend abdominal ultrasound to visualize liver better.  I have reviewed the kidney ultrasound that Dr. Jacqlyn Larsen performed this week. It primarily visualized the kidneys; note is made that the liver is fatty which is due to being overweight and fat depositing in the liver.  I would like a full evaluation of the liver since Dr. Bjorn Loser ultrasound was primarily focused on the kidneys.

## 2015-03-03 NOTE — Telephone Encounter (Signed)
Spoke with pt, advised message from Dr. Tamala Julian. Pt understood and will call to schedule ultrasound.

## 2015-03-03 NOTE — Telephone Encounter (Signed)
Called pt, Left message for pt to call back.  

## 2015-03-07 ENCOUNTER — Ambulatory Visit: Payer: PRIVATE HEALTH INSURANCE

## 2015-03-07 ENCOUNTER — Ambulatory Visit
Admission: RE | Admit: 2015-03-07 | Discharge: 2015-03-07 | Disposition: A | Discharge status: Home / Self Care | Payer: Medicare Other | Source: Ambulatory Visit | Attending: Family Medicine | Admitting: Family Medicine

## 2015-03-07 DIAGNOSIS — K760 Fatty (change of) liver, not elsewhere classified: Secondary | ICD-10-CM | POA: Diagnosis not present

## 2015-03-07 DIAGNOSIS — I700 Atherosclerosis of aorta: Secondary | ICD-10-CM | POA: Diagnosis not present

## 2015-03-07 DIAGNOSIS — R7989 Other specified abnormal findings of blood chemistry: Secondary | ICD-10-CM

## 2015-03-07 DIAGNOSIS — R945 Abnormal results of liver function studies: Secondary | ICD-10-CM

## 2015-04-25 ENCOUNTER — Encounter: Payer: Self-pay | Admitting: Family Medicine

## 2015-04-29 MED ORDER — ATORVASTATIN CALCIUM 10 MG PO TABS: 10.0000 mg | ORAL_TABLET | Freq: Every day | ORAL | Status: DC

## 2015-06-05 ENCOUNTER — Ambulatory Visit (INDEPENDENT_AMBULATORY_CARE_PROVIDER_SITE_OTHER): Payer: PRIVATE HEALTH INSURANCE | Admitting: Cardiovascular Disease

## 2015-06-05 ENCOUNTER — Encounter: Payer: Self-pay | Admitting: Cardiovascular Disease

## 2015-06-05 VITALS — BP 120/70 | HR 64 | Ht 71.0 in | Wt 290.8 lb

## 2015-06-05 DIAGNOSIS — I714 Abdominal aortic aneurysm, without rupture, unspecified: Secondary | ICD-10-CM

## 2015-06-05 DIAGNOSIS — E785 Hyperlipidemia, unspecified: Secondary | ICD-10-CM | POA: Diagnosis not present

## 2015-06-05 DIAGNOSIS — I2510 Atherosclerotic heart disease of native coronary artery without angina pectoris: Secondary | ICD-10-CM | POA: Diagnosis not present

## 2015-06-05 NOTE — Progress Notes (Signed)
Patient ID: Christian [name], male    DOB: [birth year], 74 y.o.   MRN: [id number]  HPI Comments: Christian [name] is a very pleasant 74 year old gentleman with a history of  obesity , CAD, moderate disease in a diagonal vessel, RCA and proximal circumflex, obtuse marginal branch with normal systolic function, diastolic dysfunction on echocardiogram, history of DVT and PE, Bladder surgery, history of obstructive sleep apnea, hypertension, hyperlipidemia, small AAA,  who presents for routine followup of his coronary artery disease   Reports that overall he has been feeling well.   Weight unchanged from one year ago He has been active, taking care of his wife who has had recent knee surgery No symptoms of shortness of breath or chest pain with exertion No regular exercise He has changed his diet in the past 3-4 months, now much more healthy. Concerned he has not lost significant weight. Wants to be less than 200 pounds this time next year Reports he is unable to do regular exercise because he has been taking care of his wife, also recent buttock injury 2 weeks ago Wonders if he should be on a weight suppressant or loss medication Recent elevation of liver enzymes  EKG on today's visit shows normal sinus rhythm with rate 64 bpm, no significant ST or T-wave changes   Other past medical history Last stress test was in 03/2008. He had an inferior wall defect consistent with ischemia. Significant GI uptake artifact noted in the inferior wall. EF 42%. No cardiac cath was performed at that time in follow up.   ECHO in 02/2008 shows normal EF, otherwise normal study   No Known Allergies  Current Outpatient Prescriptions on File Prior to Visit  Medication Sig Dispense Refill  . aspirin (ASPIR-LOW) 81 MG EC tablet Take 81 mg by mouth daily.      . clopidogrel (PLAVIX) 75 MG tablet Take 1 tablet (75 mg total) by mouth daily. 90 tablet 3  . nitroGLYCERIN (NITROSTAT) 0.4 MG SL tablet Place 1 tablet (0.4 mg total)  under the tongue every 5 (five) minutes as needed for chest pain. 25 tablet 6  . Omega-3 Fatty Acids (RA FISH OIL) 1000 MG CAPS Take by mouth. Take 2 tabs in morning and 2 tabs i afternoon     . ramipril (ALTACE) 10 MG capsule Take 1 capsule (10 mg total) by mouth daily. 90 capsule 3   No current facility-administered medications on file prior to visit.    Past Medical History  Diagnosis Date  . Hydrocele   . Nephrolithiasis   . MI (myocardial infarction) ([place]) 1997  . Aortic aneurysm ([place])     followed by vascular surgery yearly.  . Bladder cancer [facility])     s/p bladder resection. Cope  . DVT (deep venous thrombosis) ([place]) 10/07/2007  . Coronary artery disease     AMI anterior wall 1997; s/p PTCA to LAD 11/1995 [facility].  . Sleep apnea   . Glucose intolerance (impaired glucose tolerance)   . Hyperlipidemia   . Hypertension     Past Surgical History  Procedure Laterality Date  . Angioplasty  [address]  . Bladder removed    . Hydrocele excision    . Colonoscopy  08/27/2011    normal.  [facility].  Marland Kitchen Prostate surgery      prostatectomy with bladder resection.  . Cardiac catheterization      Social History  reports that he has never smoked. He does not have any  smokeless tobacco history on file. He reports that he does not drink alcohol or use illicit drugs.  Family History family history includes Cancer in his father; Diabetes in his brother, mother, and sister; Lung cancer in his father; Stroke in his mother.   Review of Systems  Constitutional: Negative.   Respiratory: Negative.   Cardiovascular: Negative.   Gastrointestinal: Negative.   Endocrine: Negative.   Musculoskeletal: Positive for myalgias.  Neurological: Negative.   Hematological: Negative.   Psychiatric/Behavioral: Negative.   All other systems reviewed and are negative.   BP 120/70 mmHg  Pulse 64  Ht 5\' 11"  (1.803 m)  Wt 290 lb 12 oz (131.883 kg)  BMI 40.57 kg/m2  Physical Exam   Constitutional: He is oriented to person, place, and time. He appears well-developed and well-nourished.  Obese  HENT:  Head: Normocephalic.  Nose: Nose normal.  Mouth/Throat: Oropharynx is clear and moist.  Eyes: Conjunctivae are normal. Pupils are equal, round, and reactive to light.  Neck: Normal range of motion. Neck supple. No JVD present.  Cardiovascular: Normal rate, regular rhythm, S1 normal, S2 normal, normal heart sounds and intact distal pulses.  Exam reveals no gallop and no friction rub.   No murmur heard. Pulmonary/Chest: Effort normal and breath sounds normal. No respiratory distress. He has no wheezes. He has no rales. He exhibits no tenderness.  Abdominal: Soft. Bowel sounds are normal. He exhibits no distension. There is no tenderness.  Musculoskeletal: Normal range of motion. He exhibits no edema or tenderness.  Lymphadenopathy:    He has no cervical adenopathy.  Neurological: He is alert and oriented to person, place, and time. Coordination normal.  Skin: Skin is warm and dry. No rash noted. No erythema.  Psychiatric: He has a normal mood and affect. His behavior is normal. Judgment and thought content normal.      Assessment and Plan   Nursing note and vitals reviewed.

## 2015-06-05 NOTE — Patient Instructions (Signed)
You are doing well. No medication changes were made.  Consider starting exercise program! Keep going with the diet!  Please call us if you have new issues that need to be addressed before your next appt.  Your physician wants you to follow-up in: 12 months.  You will receive a reminder letter in the mail two months in advance. If you don't receive a letter, please call our office to schedule the follow-up appointment.

## 2015-06-05 NOTE — Assessment & Plan Note (Signed)
We have encouraged Starting an exercise program, continued careful diet management in an effort to lose weight.

## 2015-06-05 NOTE — Assessment & Plan Note (Signed)
Currently with no symptoms of angina. No further workup at this time. Continue current medication regimen. 

## 2015-06-05 NOTE — Assessment & Plan Note (Signed)
Recent change from simvastatin to Lipitor Elevated LFTs likely from fatty liver Recommended weight loss

## 2015-06-05 NOTE — Assessment & Plan Note (Signed)
Stable, mildly dilated aneurysm. Ultrasound every few years with Dr. dew

## 2015-06-08 ENCOUNTER — Encounter: Payer: Self-pay | Admitting: Cardiovascular Disease

## 2015-09-06 ENCOUNTER — Ambulatory Visit: Payer: Medicare Other | Admitting: Family Medicine

## 2015-09-08 ENCOUNTER — Ambulatory Visit: Payer: Medicare Other | Admitting: Family Medicine

## 2015-09-18 ENCOUNTER — Ambulatory Visit: Payer: Medicare Other | Admitting: Family Medicine

## 2015-10-06 ENCOUNTER — Ambulatory Visit: Payer: Medicare Other | Admitting: Family Medicine

## 2015-10-13 DIAGNOSIS — Z436 Encounter for attention to other artificial openings of urinary tract: Secondary | ICD-10-CM | POA: Diagnosis not present

## 2015-10-17 ENCOUNTER — Encounter: Payer: Self-pay | Admitting: Family Medicine

## 2015-10-17 ENCOUNTER — Ambulatory Visit (INDEPENDENT_AMBULATORY_CARE_PROVIDER_SITE_OTHER): Payer: Medicare Other | Admitting: Family Medicine

## 2015-10-17 VITALS — BP 120/74 | HR 71 | Temp 98.3°F | Resp 16 | Ht 69.5 in | Wt 284.4 lb

## 2015-10-17 DIAGNOSIS — I2510 Atherosclerotic heart disease of native coronary artery without angina pectoris: Secondary | ICD-10-CM

## 2015-10-17 DIAGNOSIS — E785 Hyperlipidemia, unspecified: Secondary | ICD-10-CM | POA: Diagnosis not present

## 2015-10-17 DIAGNOSIS — R7309 Other abnormal glucose: Secondary | ICD-10-CM

## 2015-10-17 DIAGNOSIS — I714 Abdominal aortic aneurysm, without rupture, unspecified: Secondary | ICD-10-CM

## 2015-10-17 DIAGNOSIS — Z23 Encounter for immunization: Secondary | ICD-10-CM | POA: Diagnosis not present

## 2015-10-17 DIAGNOSIS — I10 Essential (primary) hypertension: Secondary | ICD-10-CM | POA: Diagnosis not present

## 2015-10-17 MED ORDER — RAMIPRIL 10 MG PO CAPS: 10.0000 mg | ORAL_CAPSULE | Freq: Every day | ORAL | Status: DC

## 2015-10-17 MED ORDER — ATORVASTATIN CALCIUM 10 MG PO TABS: 10.0000 mg | ORAL_TABLET | Freq: Every day | ORAL | Status: DC

## 2015-10-17 MED ORDER — CLOPIDOGREL BISULFATE 75 MG PO TABS: 75.0000 mg | ORAL_TABLET | Freq: Every day | ORAL | Status: DC

## 2015-10-17 NOTE — Progress Notes (Signed)
Subjective:    Patient ID: Christian [name], male    DOB: [birth year], 75 y.o.   MRN: [id number]  10/17/2015  Follow-up   HPI This 75 y.o. male presents for six month follow-up of the following:   1. HTN: Patient reports good compliance with medication, good tolerance to medication, and good symptom control.    2.  Hyperlipidemia: Patient reports good compliance with medication, good tolerance to medication, and good symptom control.    3. CAD: Patient reports good compliance with medication, good tolerance to medication, and good symptom control.    4.  Glucose intolerance: working on diet and exercise.  5.  History of bladder cancer: followed by cope yearly.   6. Aortic aneurysm: followed by vascular surgery every 6-12 months.  Review of Systems  Constitutional: Negative for fever, chills, diaphoresis, activity change, appetite change and fatigue.  Respiratory: Negative for cough and shortness of breath.   Cardiovascular: Negative for chest pain, palpitations and leg swelling.  Gastrointestinal: Negative for nausea, vomiting, abdominal pain and diarrhea.  Endocrine: Negative for cold intolerance, heat intolerance, polydipsia, polyphagia and polyuria.  Skin: Negative for color change, rash and wound.  Neurological: Negative for dizziness, tremors, seizures, syncope, facial asymmetry, speech difficulty, weakness, light-headedness, numbness and headaches.  Psychiatric/Behavioral: Negative for sleep disturbance and dysphoric mood. The patient is not nervous/anxious.     Past Medical History  Diagnosis Date  . Hydrocele   . Nephrolithiasis   . MI (myocardial infarction) ([place]) 1997  . Aortic aneurysm ([place])     followed by vascular surgery yearly.  . Bladder cancer [facility])     s/p bladder resection. Cope  . DVT (deep venous thrombosis) ([place]) 10/07/2007  . Coronary artery disease     AMI anterior wall 1997; s/p PTCA to LAD 11/1995 [facility].  . Sleep apnea   . Glucose intolerance  (impaired glucose tolerance)   . Hyperlipidemia   . Hypertension    Past Surgical History  Procedure Laterality Date  . Angioplasty  [address]  . Bladder removed    . Hydrocele excision    . Colonoscopy  08/27/2011    normal.  [facility].  Marland Kitchen Prostate surgery      prostatectomy with bladder resection.  . Cardiac catheterization     No Known Allergies Current Outpatient Prescriptions  Medication Sig Dispense Refill  . aspirin (ASPIR-LOW) 81 MG EC tablet Take 81 mg by mouth daily.      . nitroGLYCERIN (NITROSTAT) 0.4 MG SL tablet Place 1 tablet (0.4 mg total) under the tongue every 5 (five) minutes as needed for chest pain. 25 tablet 6  . Omega-3 Fatty Acids (RA FISH OIL) 1000 MG CAPS Take by mouth. Take 2 tabs in morning and 2 tabs i afternoon     . ramipril (ALTACE) 10 MG capsule Take 1 capsule (10 mg total) by mouth daily. 90 capsule 3  . atorvastatin (LIPITOR) 10 MG tablet Take 1 tablet (10 mg total) by mouth daily at 6 PM. 90 tablet 3  . clopidogrel (PLAVIX) 75 MG tablet Take 1 tablet (75 mg total) by mouth daily. 90 tablet 3   No current facility-administered medications for this visit.   Social History   Social History  . Marital Status: Married    Spouse Name: N/A  . Number of Children: N/A  . Years of Education: N/A   Occupational History  . retired    Social History Main Topics  . Smoking status:  Never Smoker   . Smokeless tobacco: Not on file     Comment: tobacco use- no   . Alcohol Use: No  . Drug Use: No  . Sexual Activity: Yes   Other Topics Concern  . Not on file   Social History Narrative   Marital status:  Married x 30 years; happily married; third marriage.      Children: none; 2 stepdaughters; 3 grandchildren.  3 gg.      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      Tobacco:  None      Alcohol:  None      Drugs: none     Exercise: Regularly exercise/walking; walking daily.      ADLs: independent with ADLs.         Advanced Directives:  Yes; FULL CODE.  No prolonged measures.      Seatbelt: 100%         Family History  Problem Relation Age of Onset  . Stroke Mother   . Diabetes Mother   . Lung cancer Father   . Cancer Father     lung cancer  . Diabetes Sister   . Diabetes Brother        Objective:    BP 120/74 mmHg  Pulse 71  Temp(Src) 98.3 F (36.8 C) (Oral)  Resp 16  Ht 5' 9.5" (1.765 m)  Wt 284 lb 6.4 oz (129.003 kg)  BMI 41.41 kg/m2  SpO2 93% Physical Exam  Constitutional: He is oriented to person, place, and time. He appears well-developed and well-nourished. No distress.  HENT:  Head: Normocephalic and atraumatic.  Right Ear: External ear normal.  Left Ear: External ear normal.  Nose: Nose normal.  Mouth/Throat: Oropharynx is clear and moist.  Eyes: Conjunctivae and EOM are normal. Pupils are equal, round, and reactive to light.  Neck: Normal range of motion. Neck supple. Carotid bruit is not present. No thyromegaly present.  Cardiovascular: Normal rate, regular rhythm, normal heart sounds and intact distal pulses.  Exam reveals no gallop and no friction rub.   No murmur heard. Pulmonary/Chest: Effort normal and breath sounds normal. He has no wheezes. He has no rales.  Abdominal: Soft. Bowel sounds are normal. He exhibits no distension and no mass. There is no tenderness. There is no rebound and no guarding.  Lymphadenopathy:    He has no cervical adenopathy.  Neurological: He is alert and oriented to person, place, and time. No cranial nerve deficit.  Skin: Skin is warm and dry. No rash noted. He is not diaphoretic.  Psychiatric: He has a normal mood and affect. His behavior is normal.  Nursing note and vitals reviewed.       Assessment & Plan:   1. Other abnormal glucose   2. Hyperlipidemia   3. Atherosclerosis of native coronary artery without angina pectoris, unspecified whether native or transplanted heart   4. Abdominal aortic aneurysm (AAA) without  rupture ([place])   5. Morbid obesity due to excess calories ([place])   6. Need for prophylactic vaccination and inoculation against influenza   7. Essential hypertension     Orders Placed This Encounter  Procedures  . Flu Vaccine QUAD 36+ mos IM  . CBC with Differential/Platelet  . Comprehensive metabolic panel    Order Specific Question:  Has the patient fasted?    Answer:  Yes  . Hemoglobin A1c  . Lipid panel    Order Specific Question:  Has the patient fasted?  Answer:  Yes   Meds ordered this encounter  Medications  . clopidogrel (PLAVIX) 75 MG tablet    Sig: Take 1 tablet (75 mg total) by mouth daily.    Dispense:  90 tablet    Refill:  3  . ramipril (ALTACE) 10 MG capsule    Sig: Take 1 capsule (10 mg total) by mouth daily.    Dispense:  90 capsule    Refill:  3  . atorvastatin (LIPITOR) 10 MG tablet    Sig: Take 1 tablet (10 mg total) by mouth daily at 6 PM.    Dispense:  90 tablet    Refill:  3    Return in about 6 months (around 04/15/2016) for complete physical examiniation.    Halsey Persaud Elayne Guerin, M.D. [facility] [address] [place], [place]  [address] [phone] phone [phone] fax

## 2015-10-18 LAB — CBC WITH DIFFERENTIAL/PLATELET
BASOS PCT: 1 % (ref 0–1)
Basophils Absolute: 0.1 10*3/uL (ref 0.0–0.1)
EOS PCT: 3 % (ref 0–5)
Eosinophils Absolute: 0.2 10*3/uL (ref 0.0–0.7)
HCT: 44 % (ref 39.0–52.0)
Hemoglobin: 14.8 g/dL (ref 13.0–17.0)
Lymphocytes Relative: 32 % (ref 12–46)
Lymphs Abs: 2.3 10*3/uL (ref 0.7–4.0)
MCH: 33 pg (ref 26.0–34.0)
MCHC: 33.6 g/dL (ref 30.0–36.0)
MCV: 98 fL (ref 78.0–100.0)
MONO ABS: 0.7 10*3/uL (ref 0.1–1.0)
MPV: 10.4 fL (ref 8.6–12.4)
Monocytes Relative: 10 % (ref 3–12)
Neutro Abs: 3.8 10*3/uL (ref 1.7–7.7)
Neutrophils Relative %: 54 % (ref 43–77)
PLATELETS: 291 10*3/uL (ref 150–400)
RBC: 4.49 MIL/uL (ref 4.22–5.81)
RDW: 14.4 % (ref 11.5–15.5)
WBC: 7.1 10*3/uL (ref 4.0–10.5)

## 2015-10-18 LAB — COMPREHENSIVE METABOLIC PANEL
ALT: 29 U/L (ref 9–46)
AST: 22 U/L (ref 10–35)
Albumin: 4.2 g/dL (ref 3.6–5.1)
Alkaline Phosphatase: 46 U/L (ref 40–115)
BILIRUBIN TOTAL: 0.6 mg/dL (ref 0.2–1.2)
BUN: 13 mg/dL (ref 7–25)
CO2: 23 mmol/L (ref 20–31)
CREATININE: 0.88 mg/dL (ref 0.70–1.18)
Calcium: 9 mg/dL (ref 8.6–10.3)
Chloride: 101 mmol/L (ref 98–110)
GLUCOSE: 101 mg/dL — AB (ref 65–99)
Potassium: 4.2 mmol/L (ref 3.5–5.3)
SODIUM: 136 mmol/L (ref 135–146)
Total Protein: 7.4 g/dL (ref 6.1–8.1)

## 2015-10-18 LAB — HEMOGLOBIN A1C
Hgb A1c MFr Bld: 5.7 % — ABNORMAL HIGH (ref <5.7)
Mean Plasma Glucose: 117 mg/dL — ABNORMAL HIGH (ref <117)

## 2015-10-18 LAB — LIPID PANEL
CHOL/HDL RATIO: 3.8 ratio (ref <=5.0)
CHOLESTEROL: 135 mg/dL (ref 125–200)
HDL: 36 mg/dL — AB (ref >=40)
LDL Cholesterol: 63 mg/dL (ref <130)
Triglycerides: 180 mg/dL — ABNORMAL HIGH (ref <150)
VLDL: 36 mg/dL — ABNORMAL HIGH (ref <30)

## 2016-01-02 DIAGNOSIS — Z436 Encounter for attention to other artificial openings of urinary tract: Secondary | ICD-10-CM | POA: Diagnosis not present

## 2016-01-08 DIAGNOSIS — Z0100 Encounter for examination of eyes and vision without abnormal findings: Secondary | ICD-10-CM | POA: Diagnosis not present

## 2016-01-08 DIAGNOSIS — H25813 Combined forms of age-related cataract, bilateral: Secondary | ICD-10-CM | POA: Diagnosis not present

## 2016-01-16 DIAGNOSIS — G4733 Obstructive sleep apnea (adult) (pediatric): Secondary | ICD-10-CM | POA: Diagnosis not present

## 2016-02-09 DIAGNOSIS — I714 Abdominal aortic aneurysm, without rupture: Secondary | ICD-10-CM | POA: Diagnosis not present

## 2016-02-09 DIAGNOSIS — I10 Essential (primary) hypertension: Secondary | ICD-10-CM | POA: Diagnosis not present

## 2016-02-09 DIAGNOSIS — I2510 Atherosclerotic heart disease of native coronary artery without angina pectoris: Secondary | ICD-10-CM | POA: Diagnosis not present

## 2016-02-09 DIAGNOSIS — E785 Hyperlipidemia, unspecified: Secondary | ICD-10-CM | POA: Diagnosis not present

## 2016-03-19 DIAGNOSIS — N430 Encysted hydrocele: Secondary | ICD-10-CM | POA: Diagnosis not present

## 2016-03-19 DIAGNOSIS — N281 Cyst of kidney, acquired: Secondary | ICD-10-CM | POA: Diagnosis not present

## 2016-03-19 DIAGNOSIS — Z9889 Other specified postprocedural states: Secondary | ICD-10-CM | POA: Diagnosis not present

## 2016-03-19 DIAGNOSIS — N289 Disorder of kidney and ureter, unspecified: Secondary | ICD-10-CM | POA: Diagnosis not present

## 2016-03-19 DIAGNOSIS — Z8551 Personal history of malignant neoplasm of bladder: Secondary | ICD-10-CM | POA: Diagnosis not present

## 2016-03-19 DIAGNOSIS — K760 Fatty (change of) liver, not elsewhere classified: Secondary | ICD-10-CM | POA: Diagnosis not present

## 2016-03-19 DIAGNOSIS — N200 Calculus of kidney: Secondary | ICD-10-CM | POA: Diagnosis not present

## 2016-03-19 DIAGNOSIS — J90 Pleural effusion, not elsewhere classified: Secondary | ICD-10-CM | POA: Diagnosis not present

## 2016-03-21 DIAGNOSIS — Z936 Other artificial openings of urinary tract status: Secondary | ICD-10-CM | POA: Diagnosis not present

## 2016-04-16 ENCOUNTER — Ambulatory Visit (INDEPENDENT_AMBULATORY_CARE_PROVIDER_SITE_OTHER): Payer: Medicare HMO | Admitting: Family Medicine

## 2016-04-16 ENCOUNTER — Encounter: Payer: Self-pay | Admitting: Family Medicine

## 2016-04-16 VITALS — BP 120/72 | HR 63 | Temp 98.1°F | Resp 17 | Ht 69.5 in | Wt 288.0 lb

## 2016-04-16 DIAGNOSIS — R7309 Other abnormal glucose: Secondary | ICD-10-CM | POA: Diagnosis not present

## 2016-04-16 DIAGNOSIS — F430 Acute stress reaction: Secondary | ICD-10-CM | POA: Diagnosis not present

## 2016-04-16 DIAGNOSIS — G4700 Insomnia, unspecified: Secondary | ICD-10-CM | POA: Diagnosis not present

## 2016-04-16 DIAGNOSIS — C679 Malignant neoplasm of bladder, unspecified: Secondary | ICD-10-CM

## 2016-04-16 DIAGNOSIS — Z23 Encounter for immunization: Secondary | ICD-10-CM

## 2016-04-16 DIAGNOSIS — J069 Acute upper respiratory infection, unspecified: Secondary | ICD-10-CM | POA: Diagnosis not present

## 2016-04-16 DIAGNOSIS — R69 Illness, unspecified: Secondary | ICD-10-CM | POA: Diagnosis not present

## 2016-04-16 DIAGNOSIS — I2510 Atherosclerotic heart disease of native coronary artery without angina pectoris: Secondary | ICD-10-CM

## 2016-04-16 DIAGNOSIS — I714 Abdominal aortic aneurysm, without rupture, unspecified: Secondary | ICD-10-CM

## 2016-04-16 DIAGNOSIS — I10 Essential (primary) hypertension: Secondary | ICD-10-CM | POA: Diagnosis not present

## 2016-04-16 DIAGNOSIS — E785 Hyperlipidemia, unspecified: Secondary | ICD-10-CM | POA: Diagnosis not present

## 2016-04-16 LAB — CBC WITH DIFFERENTIAL/PLATELET
Basophils Absolute: 0 cells/uL (ref 0–200)
Basophils Relative: 0 %
EOS PCT: 3 %
Eosinophils Absolute: 237 cells/uL (ref 15–500)
HCT: 42.5 % (ref 38.5–50.0)
Hemoglobin: 14.4 g/dL (ref 13.2–17.1)
LYMPHS ABS: 2449 {cells}/uL (ref 850–3900)
Lymphocytes Relative: 31 %
MCH: 33.3 pg — AB (ref 27.0–33.0)
MCHC: 33.9 g/dL (ref 32.0–36.0)
MCV: 98.2 fL (ref 80.0–100.0)
MONO ABS: 948 {cells}/uL (ref 200–950)
MONOS PCT: 12 %
MPV: 10.5 fL (ref 7.5–12.5)
NEUTROS ABS: 4266 {cells}/uL (ref 1500–7800)
Neutrophils Relative %: 54 %
PLATELETS: 271 10*3/uL (ref 140–400)
RBC: 4.33 MIL/uL (ref 4.20–5.80)
RDW: 14.8 % (ref 11.0–15.0)
WBC: 7.9 10*3/uL (ref 3.8–10.8)

## 2016-04-16 LAB — COMPREHENSIVE METABOLIC PANEL
ALT: 30 U/L (ref 9–46)
AST: 23 U/L (ref 10–35)
Albumin: 4.1 g/dL (ref 3.6–5.1)
Alkaline Phosphatase: 46 U/L (ref 40–115)
BUN: 12 mg/dL (ref 7–25)
CHLORIDE: 103 mmol/L (ref 98–110)
CO2: 24 mmol/L (ref 20–31)
CREATININE: 0.77 mg/dL (ref 0.70–1.18)
Calcium: 9.2 mg/dL (ref 8.6–10.3)
Glucose, Bld: 90 mg/dL (ref 65–99)
Potassium: 4.3 mmol/L (ref 3.5–5.3)
SODIUM: 138 mmol/L (ref 135–146)
Total Bilirubin: 0.5 mg/dL (ref 0.2–1.2)
Total Protein: 7.3 g/dL (ref 6.1–8.1)

## 2016-04-16 LAB — LIPID PANEL
Cholesterol: 118 mg/dL — ABNORMAL LOW (ref 125–200)
HDL: 34 mg/dL — ABNORMAL LOW (ref >=40)
LDL CALC: 39 mg/dL (ref <130)
TRIGLYCERIDES: 227 mg/dL — AB (ref <150)
Total CHOL/HDL Ratio: 3.5 Ratio (ref <=5.0)
VLDL: 45 mg/dL — AB (ref <30)

## 2016-04-16 MED ORDER — CLOPIDOGREL BISULFATE 75 MG PO TABS
75.0000 mg | ORAL_TABLET | Freq: Every day | ORAL | 3 refills | Status: DC
Start: 2016-04-16 — End: 2016-12-25

## 2016-04-16 MED ORDER — NITROGLYCERIN 0.4 MG SL SUBL: 0.4000 mg | SUBLINGUAL_TABLET | SUBLINGUAL | 6 refills | Status: DC | PRN

## 2016-04-16 MED ORDER — AMOXICILLIN 500 MG PO TABS: 1000.0000 mg | ORAL_TABLET | Freq: Two times a day (BID) | ORAL | 0 refills | Status: DC

## 2016-04-16 MED ORDER — TRAZODONE HCL 50 MG PO TABS: 50.0000 mg | ORAL_TABLET | Freq: Every evening | ORAL | 5 refills | Status: DC | PRN

## 2016-04-16 MED ORDER — MUCINEX DM 30-600 MG PO TB12: 1.0000 | ORAL_TABLET | Freq: Two times a day (BID) | ORAL | 0 refills | Status: DC

## 2016-04-16 MED ORDER — ATORVASTATIN CALCIUM 10 MG PO TABS: 10.0000 mg | ORAL_TABLET | Freq: Every day | ORAL | 3 refills | Status: DC

## 2016-04-16 MED ORDER — RAMIPRIL 10 MG PO CAPS: 10.0000 mg | ORAL_CAPSULE | Freq: Every day | ORAL | 3 refills | Status: DC

## 2016-04-16 NOTE — Patient Instructions (Signed)
     IF you received an x-ray today, you will receive an invoice from [facility]. Please contact [facility] at [phone] with questions or concerns regarding your invoice.   IF you received labwork today, you will receive an invoice from [facility]. Please contact [facility] at [phone] with questions or concerns regarding your invoice.   Our billing staff will not be able to assist you with questions regarding bills from these companies.  You will be contacted with the lab results as soon as they are available. The fastest way to get your results is to activate your My Chart account. Instructions are located on the last page of this paperwork. If you have not heard from us regarding the results in 2 weeks, please contact this office.      

## 2016-04-16 NOTE — Progress Notes (Signed)
By signing my name below, I, Mesha Guinyard, attest that this documentation has been prepared under the direction and in the presence of Treatment Team:  Attending Provider: [name], MD.  Electronically Signed: Verlee Monte, Medical Scribe. 04/16/2016. 11:46 AM.  Subjective:    Patient ID: Christian [name], male    DOB: 04-Nov-1940, 75 y.o.   MRN: [id number]    04/16/2016  Follow-up (Lab work?)   HPI  HPI Comments: Christian [name] is a 75 y.o. male who presents to the [facility] complaining of constant right ear pain and sore throat onset 1 week ago. Pt had a subjective fever 2-3 days ago, constant right sided mandible pain, right sided HA, productive cough with clear sputum, sleep disturbance, and loss of appetite. Pt took 2 Tylenol for little to no relief to his feverish symptoms.   Pt had 2 friends pass, and church family friends died in the past 3 weeks. Pt states he'll sleep in 30 mins and he can't go back to sleep and suspects it's due to all of the loss of friends he's recently had. Pt was prescribed Ambien for his sleep loss by Dr. Jacqlyn Larsen, but because it wasn't his family PCP his insurance wouldn't pay for it. Pt would like to get something that would help him sleep and his insurance would pay for.   Bladder cancer: Pt went to Dr. Jacqlyn Larsen and he had nl results. Pt would like to get his flu shot. Pt denies rhinorrhea, nausea, chest pain, and SOB.  HTN: Patient reports good compliance with medication, good tolerance to medication, and good symptom control.    Hyperlipidemia: Patient reports good compliance with medication, good tolerance to medication, and good symptom control.    Glucose intolerance: due for repeat labs.  Followed by Dr. Rockey Situ annually; followed by Dr Lucky Cowboy annually.  Review of Systems  Constitutional: Positive for appetite change and fever. Negative for activity change, chills, diaphoresis and fatigue.  HENT: Positive for congestion, ear  pain and sore throat. Negative for rhinorrhea.   Respiratory: Positive for cough. Negative for shortness of breath.   Cardiovascular: Negative for chest pain, palpitations and leg swelling.  Gastrointestinal: Negative for abdominal pain, diarrhea, nausea and vomiting.  Endocrine: Negative for cold intolerance, heat intolerance, polydipsia, polyphagia and polyuria.  Skin: Negative for color change, rash and wound.  Neurological: Positive for headaches. Negative for dizziness, tremors, seizures, syncope, facial asymmetry, speech difficulty, weakness, light-headedness and numbness.  Psychiatric/Behavioral: Positive for sleep disturbance. Negative for dysphoric mood. The patient is nervous/anxious.     Past Medical History:  Diagnosis Date  . Aortic aneurysm ([place])    followed by vascular surgery yearly.  . Bladder cancer [facility])    s/p bladder resection. [place]  . Coronary artery disease    AMI anterior wall 1997; s/p PTCA to LAD 11/1995 [facility].  Marland Kitchen DVT (deep venous thrombosis) ([place]) 10/07/2007  . Glucose intolerance (impaired glucose tolerance)   . Hydrocele   . Hyperlipidemia   . Hypertension   . MI (myocardial infarction) ([place]) 1997  . Nephrolithiasis   . Sleep apnea    Past Surgical History:  Procedure Laterality Date  . [place]  . bladder removed    . CARDIAC CATHETERIZATION    . COLONOSCOPY  08/27/2011   normal.  [facility].  Marland Kitchen HYDROCELE EXCISION    . PROSTATE SURGERY     prostatectomy with bladder resection.   No Known Allergies  Social History   Social History  . Marital status: Married    Spouse name: N/A  . Number of children: N/A  . Years of education: N/A   Occupational History  . retired    Social History Main Topics  . Smoking status: Never Smoker  . Smokeless tobacco: Not on file     Comment: tobacco use- no   . Alcohol use No  . Drug use: No  . Sexual activity: Yes   Other Topics Concern  . Not on file   Social History  Narrative   Marital status:  Married x 30 years; happily married; third marriage.      Children: none; 2 stepdaughters; 3 grandchildren.  3 gg.      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      Tobacco:  None      Alcohol:  None      Drugs: none     Exercise: Regularly exercise/walking; walking daily.      ADLs: independent with ADLs.        Advanced Directives:  Yes; FULL CODE.  No prolonged measures.      Seatbelt: 100%         Family History  Problem Relation Age of Onset  . Stroke Mother   . Diabetes Mother   . Lung cancer Father   . Cancer Father     lung cancer  . Diabetes Sister   . Diabetes Brother        Objective:    BP 120/72 (BP Location: Right Arm, Patient Position: Sitting, Cuff Size: Large)   Pulse 63   Temp 98.1 F (36.7 C) (Oral)   Resp 17   Ht 5' 9.5" (1.765 m)   Wt 288 lb (130.6 kg)   SpO2 94%   BMI 41.92 kg/m  Physical Exam  Constitutional: He is oriented to person, place, and time. He appears well-developed and well-nourished. No distress.  HENT:  Head: Normocephalic and atraumatic.  Right Ear: External ear normal.  Left Ear: Tympanic membrane, external ear and ear canal normal.  Nose: Nose normal.  Mouth/Throat: Oropharynx is clear and moist.  right ear had clear fluid at the base of the TM Left ear nl  Eyes: Conjunctivae and EOM are normal. Pupils are equal, round, and reactive to light.  Neck: Normal range of motion. Neck supple. Carotid bruit is not present. No thyromegaly present.  Cardiovascular: Normal rate, regular rhythm, normal heart sounds and intact distal pulses.  Exam reveals no gallop and no friction rub.   No murmur heard. Pulmonary/Chest: Effort normal and breath sounds normal. No respiratory distress. He has no wheezes. He has no rales.  Abdominal: Soft. Bowel sounds are normal. He exhibits no distension and no mass. There is no tenderness. There is no rebound and no guarding.  Lymphadenopathy:    He has  no cervical adenopathy.  Neurological: He is alert and oriented to person, place, and time. No cranial nerve deficit.  Skin: Skin is warm and dry. No rash noted. He is not diaphoretic.  Psychiatric: He has a normal mood and affect. His behavior is normal.  Nursing note and vitals reviewed.   Assessment & Plan:   1. Hyperlipidemia   2. Other abnormal glucose   3. Atherosclerosis of native coronary artery without angina pectoris, unspecified whether native or transplanted heart   4. Abdominal aortic aneurysm (AAA) without rupture ([place])   5. Malignant neoplasm of urinary bladder, unspecified site (  [place])   6. Need for prophylactic vaccination and inoculation against influenza   7. Essential hypertension   8. Acute upper respiratory infection   9. Insomnia   10. Acute stress reaction    Take trazodone instead of Ambien. Your insurance will cover it.  Ambien contraindicated in the elderly. -Amoxicillin and Mucinex for URI -s/p flu vaccine. -obtain labs. -refills provided.   Orders Placed This Encounter  Procedures  . Flu Vaccine QUAD 36+ mos IM  . CBC with Differential/Platelet  . Comprehensive metabolic panel    Order Specific Question:   Has the patient fasted?    Answer:   Yes  . Lipid panel    Order Specific Question:   Has the patient fasted?    Answer:   Yes  . Hemoglobin A1c   Meds ordered this encounter  Medications  . DISCONTD: traZODone (DESYREL) 50 MG tablet    Sig: Take 1-2 tablets (50-100 mg total) by mouth at bedtime as needed for sleep.    Dispense:  60 tablet    Refill:  5  . nitroGLYCERIN (NITROSTAT) 0.4 MG SL tablet    Sig: Place 1 tablet (0.4 mg total) under the tongue every 5 (five) minutes as needed for chest pain.    Dispense:  25 tablet    Refill:  6  . clopidogrel (PLAVIX) 75 MG tablet    Sig: Take 1 tablet (75 mg total) by mouth daily.    Dispense:  90 tablet    Refill:  3  . ramipril (ALTACE) 10 MG capsule    Sig: Take 1 capsule (10 mg total)  by mouth daily.    Dispense:  90 capsule    Refill:  3  . atorvastatin (LIPITOR) 10 MG tablet    Sig: Take 1 tablet (10 mg total) by mouth daily at 6 PM.    Dispense:  90 tablet    Refill:  3  . traZODone (DESYREL) 50 MG tablet    Sig: Take 1-2 tablets (50-100 mg total) by mouth at bedtime as needed for sleep.    Dispense:  60 tablet    Refill:  5  . amoxicillin (AMOXIL) 500 MG tablet    Sig: Take 2 tablets (1,000 mg total) by mouth 2 (two) times daily.    Dispense:  40 tablet    Refill:  0  . Dextromethorphan-Guaifenesin (MUCINEX DM) 30-600 MG TB12    Sig: Take 1 tablet by mouth 2 (two) times daily.    Dispense:  28 each    Refill:  0    Return in about 6 months (around 10/17/2016) for complete physical examiniation.  I personally performed the services described in this documentation, which was scribed in my presence. The recorded information has been reviewed and considered.  Miryam Mcelhinney Elayne Guerin, M.D. [facility] [address] [place], [place]  [address] [phone] phone [phone] fax

## 2016-04-17 LAB — HEMOGLOBIN A1C
Hgb A1c MFr Bld: 5.7 % — ABNORMAL HIGH (ref <5.7)
MEAN PLASMA GLUCOSE: 117 mg/dL

## 2016-04-23 ENCOUNTER — Encounter: Payer: Self-pay | Admitting: Family Medicine

## 2016-04-25 DIAGNOSIS — G4733 Obstructive sleep apnea (adult) (pediatric): Secondary | ICD-10-CM | POA: Diagnosis not present

## 2016-04-26 MED ORDER — AZITHROMYCIN 250 MG PO TABS: ORAL_TABLET | ORAL | 0 refills | Status: DC

## 2016-04-26 MED ORDER — AZELASTINE HCL 0.1 % NA SOLN: 2.0000 | Freq: Two times a day (BID) | NASAL | 0 refills | Status: DC

## 2016-06-13 ENCOUNTER — Encounter: Payer: Self-pay | Admitting: Cardiovascular Disease

## 2016-06-13 ENCOUNTER — Ambulatory Visit (INDEPENDENT_AMBULATORY_CARE_PROVIDER_SITE_OTHER): Payer: Medicare HMO | Admitting: Cardiovascular Disease

## 2016-06-13 VITALS — BP 112/64 | HR 75 | Ht 71.0 in | Wt 279.5 lb

## 2016-06-13 DIAGNOSIS — R7303 Prediabetes: Secondary | ICD-10-CM | POA: Diagnosis not present

## 2016-06-13 DIAGNOSIS — I2510 Atherosclerotic heart disease of native coronary artery without angina pectoris: Secondary | ICD-10-CM

## 2016-06-13 DIAGNOSIS — E785 Hyperlipidemia, unspecified: Secondary | ICD-10-CM

## 2016-06-13 NOTE — Progress Notes (Signed)
Cardiology Office Note  Date:  06/13/2016   ID:  Christian [name], DOB 06/19/1941, MRN [id number]  PCP:  Reginia Forts, MD   Chief Complaint  Patient presents with  . other    1 yr f/u no complaints today. Meds reviewed verbally with pt.    HPI:  Christian [name] is a very pleasant 75 year old gentleman with a history of  obesity , CAD, moderate disease in a diagonal vessel, RCA and proximal circumflex, obtuse marginal branch with normal systolic function, diastolic dysfunction on echocardiogram, history of DVT and PE, Bladder surgery, history of obstructive sleep apnea, hypertension, hyperlipidemia, small AAA,  who presents for routine followup of his coronary artery disease  He presents today with his wife  In follow-up today, he reports that he has changed his diet dramatically With his wife, now on low-carb diet Trying to lose more weight  Also tried to get more active No regular exercise program. Was previously doing regular exercise  Lab work reviewed with him from August 2017 Cholesterol at goal  EKG on today's visit shows normal sinus rhythm with rate 75 bpm, no significant ST or T-wave changes   Other past medical history Last stress test was in 03/2008. He had an inferior wall defect consistent with ischemia. Significant GI uptake artifact noted in the inferior wall. EF 42%. No cardiac cath was performed at that time in follow up.   ECHO in 02/2008 shows normal EF, otherwise normal study  PMH:   has a past medical history of Aortic aneurysm ([place]); Bladder cancer ([place]); Coronary artery disease; DVT (deep venous thrombosis) ([place]) (10/07/2007); Glucose intolerance (impaired glucose tolerance); Hydrocele; Hyperlipidemia; Hypertension; MI (myocardial infarction) (1997); Nephrolithiasis; and Sleep apnea.  PSH:    Past Surgical History:  Procedure Laterality Date  . [place]  . bladder removed    . CARDIAC CATHETERIZATION    . COLONOSCOPY  08/27/2011   normal.   [facility].  Marland Kitchen HYDROCELE EXCISION    . PROSTATE SURGERY     prostatectomy with bladder resection.    Current Outpatient Prescriptions  Medication Sig Dispense Refill  . aspirin (ASPIR-LOW) 81 MG EC tablet Take 81 mg by mouth daily.      Marland Kitchen atorvastatin (LIPITOR) 10 MG tablet Take 1 tablet (10 mg total) by mouth daily at 6 PM. 90 tablet 3  . clopidogrel (PLAVIX) 75 MG tablet Take 1 tablet (75 mg total) by mouth daily. 90 tablet 3  . nitroGLYCERIN (NITROSTAT) 0.4 MG SL tablet Place 1 tablet (0.4 mg total) under the tongue every 5 (five) minutes as needed for chest pain. 25 tablet 6  . Omega-3 Fatty Acids (RA FISH OIL) 1000 MG CAPS Take by mouth. Take 2 tabs in morning and 2 tabs i afternoon     . ramipril (ALTACE) 10 MG capsule Take 1 capsule (10 mg total) by mouth daily. 90 capsule 3   No current facility-administered medications for this visit.      Allergies:   Review of patient's allergies indicates no known allergies.   Social History:  The patient  reports that he has never smoked. He has never used smokeless tobacco. He reports that he does not drink alcohol or use drugs.   Family History:   family history includes Cancer in his father; Diabetes in his brother, mother, and sister; Lung cancer in his father; Stroke in his mother.    Review of Systems: Review of Systems  Constitutional: Negative.   Respiratory: Negative.  Cardiovascular: Negative.   Gastrointestinal: Negative.   Musculoskeletal: Negative.   Neurological: Negative.   Psychiatric/Behavioral: Negative.   All other systems reviewed and are negative.    PHYSICAL EXAM: VS:  BP 112/64 (BP Location: Left Arm, Patient Position: Sitting, Cuff Size: Large)   Pulse 75   Ht 5\' 11"  (1.803 m)   Wt 279 lb 8 oz (126.8 kg)   BMI 38.98 kg/m  , BMI Body mass index is 38.98 kg/m. GEN: Well nourished, well developed, in no acute distress, obese HEENT: normal  Neck: no JVD, carotid bruits, or masses Cardiac:  RRR; no murmurs, rubs, or gallops,no edema  Respiratory:  clear to auscultation bilaterally, normal work of breathing GI: soft, nontender, nondistended, + BS MS: no deformity or atrophy  Skin: warm and dry, no rash Neuro:  Strength and sensation are intact Psych: euthymic mood, full affect    Recent Labs: 04/16/2016: ALT 30; BUN 12; Creat 0.77; Hemoglobin 14.4; Platelets 271; Potassium 4.3; Sodium 138    Lipid Panel Lab Results  Component Value Date   CHOL 118 (L) 04/16/2016   HDL 34 (L) 04/16/2016   LDLCALC 39 04/16/2016   TRIG 227 (H) 04/16/2016      Wt Readings from Last 3 Encounters:  06/13/16 279 lb 8 oz (126.8 kg)  04/16/16 288 lb (130.6 kg)  10/17/15 284 lb 6.4 oz (129 kg)       ASSESSMENT AND PLAN:  Atherosclerosis of native coronary artery without angina pectoris, unspecified whether native or transplanted heart - Plan: EKG 12-Lead Currently with no symptoms of angina. No further workup at this time. Continue current medication regimen.  Hyperlipidemia, unspecified hyperlipidemia type - Plan: EKG 12-Lead Cholesterol is at goal on the current lipid regimen. No changes to the medications were made. Elevated triglycerides  OBESITY-MORBID (>100') We have encouraged continued exercise, careful diet management in an effort to lose weight.  Borderline diabetic A1c 5.7 Encouraged continued weight loss  Additional time spent today with patient and his wife discussing coronary disease, coronary calcifications, management of risk factors including cholesterol Examples provided including discussing wife's CT coronary calcium score  Total encounter time more than 25 minutes  Greater than 50% was spent in counseling and coordination of care with the patient   Disposition:   F/U  12 months   Orders Placed This Encounter  Procedures  . EKG 12-Lead     Signed, Esmond Plants, M.D., Ph.D. 06/13/2016  [place],  [place]

## 2016-06-13 NOTE — Patient Instructions (Signed)

## 2016-06-20 DIAGNOSIS — Z936 Other artificial openings of urinary tract status: Secondary | ICD-10-CM | POA: Diagnosis not present

## 2016-07-02 ENCOUNTER — Telehealth: Payer: Self-pay | Admitting: Family Medicine

## 2016-07-02 NOTE — Telephone Encounter (Signed)
lmom to call and reschedule his appointment that he has with Dr Tamala Julian at 12 to another time this is lunch time for her

## 2016-07-24 ENCOUNTER — Ambulatory Visit: Payer: Medicare HMO | Admitting: Family Medicine

## 2016-07-29 DIAGNOSIS — G4733 Obstructive sleep apnea (adult) (pediatric): Secondary | ICD-10-CM | POA: Diagnosis not present

## 2016-07-31 ENCOUNTER — Encounter: Payer: Self-pay | Admitting: Family Medicine

## 2016-07-31 ENCOUNTER — Ambulatory Visit (INDEPENDENT_AMBULATORY_CARE_PROVIDER_SITE_OTHER): Payer: Medicare HMO | Admitting: Family Medicine

## 2016-07-31 VITALS — BP 138/74 | HR 71 | Temp 98.3°F | Resp 18 | Ht 71.0 in | Wt 281.0 lb

## 2016-07-31 DIAGNOSIS — C673 Malignant neoplasm of anterior wall of bladder: Secondary | ICD-10-CM | POA: Diagnosis not present

## 2016-07-31 DIAGNOSIS — I714 Abdominal aortic aneurysm, without rupture, unspecified: Secondary | ICD-10-CM

## 2016-07-31 DIAGNOSIS — Z23 Encounter for immunization: Secondary | ICD-10-CM

## 2016-07-31 DIAGNOSIS — I2510 Atherosclerotic heart disease of native coronary artery without angina pectoris: Secondary | ICD-10-CM | POA: Diagnosis not present

## 2016-07-31 DIAGNOSIS — R7309 Other abnormal glucose: Secondary | ICD-10-CM | POA: Diagnosis not present

## 2016-07-31 DIAGNOSIS — E7800 Pure hypercholesterolemia, unspecified: Secondary | ICD-10-CM

## 2016-07-31 NOTE — Patient Instructions (Signed)
     IF you received an x-ray today, you will receive an invoice from [facility]. Please contact [facility] at [phone] with questions or concerns regarding your invoice.   IF you received labwork today, you will receive an invoice from [facility]. Please contact [facility] at [phone] with questions or concerns regarding your invoice.   Our billing staff will not be able to assist you with questions regarding bills from these companies.  You will be contacted with the lab results as soon as they are available. The fastest way to get your results is to activate your My Chart account. Instructions are located on the last page of this paperwork. If you have not heard from us regarding the results in 2 weeks, please contact this office.      

## 2016-07-31 NOTE — Progress Notes (Signed)
Subjective:    Patient ID: Christian [name], male    DOB: 1941-05-20, 75 y.o.   MRN: [id number]  07/31/2016  Follow-up and Hyperlipidemia   HPI This 75 y.o. male presents for four month follow-up of hypertension, glucose intolerance, hypercholesterolemia.Patient reports good compliance with medication, good tolerance to medication, and good symptom control.   Has not concerns or issues today; doing well. Feels well.  Reports compliance with Dr. Jacqlyn Larsen regarding bladder cancer    Immunization History  Administered Date(s) Administered  . Influenza,inj,Quad PF,36+ Mos 06/02/2013, 10/17/2015, 04/16/2016  . Pneumococcal Conjugate-13 07/31/2016    Review of Systems  Constitutional: Negative for activity change, appetite change, chills, diaphoresis, fatigue and fever.  Respiratory: Negative for cough and shortness of breath.   Cardiovascular: Negative for chest pain, palpitations and leg swelling.  Gastrointestinal: Negative for abdominal pain, diarrhea, nausea and vomiting.  Endocrine: Negative for cold intolerance, heat intolerance, polydipsia, polyphagia and polyuria.  Skin: Negative for color change, rash and wound.  Neurological: Negative for dizziness, tremors, seizures, syncope, facial asymmetry, speech difficulty, weakness, light-headedness, numbness and headaches.  Psychiatric/Behavioral: Negative for dysphoric mood and sleep disturbance. The patient is not nervous/anxious.     Past Medical History:  Diagnosis Date  . Aortic aneurysm ([place])    followed by vascular surgery yearly.  . Bladder cancer [facility])    s/p bladder resection. [place]  . Coronary artery disease    AMI anterior wall 1997; s/p PTCA to LAD 11/1995 [facility].  Marland Kitchen DVT (deep venous thrombosis) ([place]) 10/07/2007  . Glucose intolerance (impaired glucose tolerance)   . Hydrocele   . Hyperlipidemia   . Hypertension   . MI (myocardial infarction) 1997  . Nephrolithiasis   . Sleep apnea    Past Surgical History:  Procedure  Laterality Date  . [place]  . bladder removed    . CARDIAC CATHETERIZATION    . COLONOSCOPY  08/27/2011   normal.  [facility].  Marland Kitchen HYDROCELE EXCISION    . PROSTATE SURGERY     prostatectomy with bladder resection.   No Known Allergies Current Outpatient Prescriptions  Medication Sig Dispense Refill  . aspirin (ASPIR-LOW) 81 MG EC tablet Take 81 mg by mouth daily.      Marland Kitchen atorvastatin (LIPITOR) 10 MG tablet Take 1 tablet (10 mg total) by mouth daily at 6 PM. 90 tablet 3  . clopidogrel (PLAVIX) 75 MG tablet Take 1 tablet (75 mg total) by mouth daily. 90 tablet 3  . nitroGLYCERIN (NITROSTAT) 0.4 MG SL tablet Place 1 tablet (0.4 mg total) under the tongue every 5 (five) minutes as needed for chest pain. 25 tablet 6  . Omega-3 Fatty Acids (RA FISH OIL) 1000 MG CAPS Take by mouth. Take 2 tabs in morning and 2 tabs i afternoon     . ramipril (ALTACE) 10 MG capsule Take 1 capsule (10 mg total) by mouth daily. 90 capsule 3   No current facility-administered medications for this visit.    Social History   Social History  . Marital status: Married    Spouse name: N/A  . Number of children: N/A  . Years of education: N/A   Occupational History  . retired    Social History Main Topics  . Smoking status: Never Smoker  . Smokeless tobacco: Never Used     Comment: tobacco use- no   . Alcohol use No  . Drug use: No  . Sexual activity: Yes   Other Topics  Concern  . Not on file   Social History Narrative   Marital status:  Married x 30 years; happily married; third marriage.      Children: none; 2 stepdaughters; 3 grandchildren.  3 gg.      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      Tobacco:  None      Alcohol:  None      Drugs: none     Exercise: Regularly exercise/walking; walking daily.      ADLs: independent with ADLs.        Advanced Directives:  Yes; FULL CODE.  No prolonged measures.      Seatbelt: 100%         Family  History  Problem Relation Age of Onset  . Stroke Mother   . Diabetes Mother   . Lung cancer Father   . Cancer Father     lung cancer  . Diabetes Sister   . Diabetes Brother        Objective:    BP 138/74   Pulse 71   Temp 98.3 F (36.8 C) (Oral)   Resp 18   Ht 5\' 11"  (1.803 m)   Wt 281 lb (127.5 kg)   SpO2 95%   BMI 39.19 kg/m  Physical Exam  Constitutional: He is oriented to person, place, and time. He appears well-developed and well-nourished. No distress.  HENT:  Head: Normocephalic and atraumatic.  Right Ear: External ear normal.  Left Ear: External ear normal.  Nose: Nose normal.  Mouth/Throat: Oropharynx is clear and moist.  Eyes: Conjunctivae and EOM are normal. Pupils are equal, round, and reactive to light.  Neck: Normal range of motion. Neck supple. Carotid bruit is not present. No thyromegaly present.  Cardiovascular: Normal rate, regular rhythm, normal heart sounds and intact distal pulses.  Exam reveals no gallop and no friction rub.   No murmur heard. Pulmonary/Chest: Effort normal and breath sounds normal. He has no wheezes. He has no rales.  Abdominal: Soft. Bowel sounds are normal. He exhibits no distension and no mass. There is no tenderness. There is no rebound and no guarding.  Ostomy intact RLQ.  Lymphadenopathy:    He has no cervical adenopathy.  Neurological: He is alert and oriented to person, place, and time. No cranial nerve deficit.  Skin: Skin is warm and dry. No rash noted. He is not diaphoretic.  Psychiatric: He has a normal mood and affect. His behavior is normal.  Nursing note and vitals reviewed.  Results for orders placed or performed in visit on 07/31/16  CBC with Differential/Platelet  Result Value Ref Range   WBC 7.9 3.4 - 10.8 x10E3/uL   RBC 4.38 4.14 - 5.80 x10E6/uL   Hemoglobin 14.7 13.0 - 17.7 g/dL   Hematocrit 43.0 37.5 - 51.0 %   MCV 98 (H) 79 - 97 fL   MCH 33.6 (H) 26.6 - 33.0 pg   MCHC 34.2 31.5 - 35.7 g/dL   RDW  14.7 12.3 - 15.4 %   Platelets 281 150 - 379 x10E3/uL   Neutrophils 52 Not Estab. %   Lymphs 34 Not Estab. %   Monocytes 10 Not Estab. %   Eos 4 Not Estab. %   Basos 0 Not Estab. %   Neutrophils Absolute 4.1 1.4 - 7.0 x10E3/uL   Lymphocytes Absolute 2.7 0.7 - 3.1 x10E3/uL   Monocytes Absolute 0.8 0.1 - 0.9 x10E3/uL   EOS (ABSOLUTE) 0.3 0.0 - 0.4 x10E3/uL  Basophils Absolute 0.0 0.0 - 0.2 x10E3/uL   Immature Granulocytes 0 Not Estab. %   Immature Grans (Abs) 0.0 0.0 - 0.1 x10E3/uL  Comprehensive metabolic panel  Result Value Ref Range   Glucose 84 65 - 99 mg/dL   BUN 12 8 - 27 mg/dL   Creatinine, Ser 0.76 0.76 - 1.27 mg/dL   GFR calc non Af Amer 91 >59 mL/min/1.73   GFR calc Af Amer 105 >59 mL/min/1.73   BUN/Creatinine Ratio 16 10 - 24   Sodium 139 134 - 144 mmol/L   Potassium 4.4 3.5 - 5.2 mmol/L   Chloride 99 96 - 106 mmol/L   CO2 24 18 - 29 mmol/L   Calcium 9.2 8.6 - 10.2 mg/dL   Total Protein 7.4 6.0 - 8.5 g/dL   Albumin 4.2 3.5 - 4.8 g/dL   Globulin, Total 3.2 1.5 - 4.5 g/dL   Albumin/Globulin Ratio 1.3 1.2 - 2.2   Bilirubin Total 0.3 0.0 - 1.2 mg/dL   Alkaline Phosphatase 60 39 - 117 IU/L   AST 20 0 - 40 IU/L   ALT 24 0 - 44 IU/L  Lipid panel  Result Value Ref Range   Cholesterol, Total 144 100 - 199 mg/dL   Triglycerides 211 (H) 0 - 149 mg/dL   HDL 36 (L) >39 mg/dL   VLDL Cholesterol Cal 42 (H) 5 - 40 mg/dL   LDL Calculated 66 0 - 99 mg/dL   Chol/HDL Ratio 4.0 0.0 - 5.0 ratio units       Assessment & Plan:   1. Atherosclerosis of native coronary artery of native heart without angina pectoris   2. Abdominal aortic aneurysm (AAA) without rupture ([place])   3. Pure hypercholesterolemia   4. Other abnormal glucose   5. Malignant neoplasm of anterior wall of urinary bladder (HCC)   6. Need for prophylactic vaccination against Streptococcus pneumoniae (pneumococcus)     Orders Placed This Encounter  Procedures  . Pneumococcal conjugate vaccine 13-valent IM  .  CBC with Differential/Platelet  . Comprehensive metabolic panel    Order Specific Question:   Has the patient fasted?    Answer:   Yes  . Lipid panel    Order Specific Question:   Has the patient fasted?    Answer:   Yes   No orders of the defined types were placed in this encounter.   Return in about 4 months (around 11/29/2016) for complete physical examiniation.   Kayvon Mo Elayne Guerin, M.D. [facility] [address] [place], [place]  [address] [phone] phone [phone] fax

## 2016-08-01 LAB — CBC WITH DIFFERENTIAL/PLATELET
BASOS ABS: 0 10*3/uL (ref 0.0–0.2)
Basos: 0 %
EOS (ABSOLUTE): 0.3 10*3/uL (ref 0.0–0.4)
Eos: 4 %
Hematocrit: 43 % (ref 37.5–51.0)
Hemoglobin: 14.7 g/dL (ref 13.0–17.7)
Immature Grans (Abs): 0 10*3/uL (ref 0.0–0.1)
Immature Granulocytes: 0 %
LYMPHS ABS: 2.7 10*3/uL (ref 0.7–3.1)
Lymphs: 34 %
MCH: 33.6 pg — AB (ref 26.6–33.0)
MCHC: 34.2 g/dL (ref 31.5–35.7)
MCV: 98 fL — ABNORMAL HIGH (ref 79–97)
MONOCYTES: 10 %
MONOS ABS: 0.8 10*3/uL (ref 0.1–0.9)
NEUTROS ABS: 4.1 10*3/uL (ref 1.4–7.0)
Neutrophils: 52 %
PLATELETS: 281 10*3/uL (ref 150–379)
RBC: 4.38 x10E6/uL (ref 4.14–5.80)
RDW: 14.7 % (ref 12.3–15.4)
WBC: 7.9 10*3/uL (ref 3.4–10.8)

## 2016-08-01 LAB — COMPREHENSIVE METABOLIC PANEL
ALK PHOS: 60 IU/L (ref 39–117)
ALT: 24 IU/L (ref 0–44)
AST: 20 IU/L (ref 0–40)
Albumin/Globulin Ratio: 1.3 (ref 1.2–2.2)
Albumin: 4.2 g/dL (ref 3.5–4.8)
BILIRUBIN TOTAL: 0.3 mg/dL (ref 0.0–1.2)
BUN / CREAT RATIO: 16 (ref 10–24)
BUN: 12 mg/dL (ref 8–27)
CO2: 24 mmol/L (ref 18–29)
CREATININE: 0.76 mg/dL (ref 0.76–1.27)
Calcium: 9.2 mg/dL (ref 8.6–10.2)
Chloride: 99 mmol/L (ref 96–106)
GFR calc Af Amer: 105 mL/min/{1.73_m2} (ref >59)
GFR calc non Af Amer: 91 mL/min/{1.73_m2} (ref >59)
GLUCOSE: 84 mg/dL (ref 65–99)
Globulin, Total: 3.2 g/dL (ref 1.5–4.5)
Potassium: 4.4 mmol/L (ref 3.5–5.2)
Sodium: 139 mmol/L (ref 134–144)
Total Protein: 7.4 g/dL (ref 6.0–8.5)

## 2016-08-01 LAB — LIPID PANEL
CHOLESTEROL TOTAL: 144 mg/dL (ref 100–199)
Chol/HDL Ratio: 4 ratio units (ref 0.0–5.0)
HDL: 36 mg/dL — ABNORMAL LOW (ref >39)
LDL CALC: 66 mg/dL (ref 0–99)
TRIGLYCERIDES: 211 mg/dL — AB (ref 0–149)
VLDL CHOLESTEROL CAL: 42 mg/dL — AB (ref 5–40)

## 2016-09-15 DIAGNOSIS — Z936 Other artificial openings of urinary tract status: Secondary | ICD-10-CM | POA: Diagnosis not present

## 2016-10-22 ENCOUNTER — Encounter: Payer: Medicare HMO | Admitting: Family Medicine

## 2016-12-01 ENCOUNTER — Emergency Department: Payer: Medicare HMO

## 2016-12-01 ENCOUNTER — Encounter: Payer: Self-pay | Admitting: Emergency Medicine

## 2016-12-01 ENCOUNTER — Emergency Department
Admission: EM | Admit: 2016-12-01 | Discharge: 2016-12-01 | Disposition: A | Discharge status: Home / Self Care | Payer: Medicare HMO | Attending: Emergency Medicine | Admitting: Emergency Medicine

## 2016-12-01 DIAGNOSIS — I10 Essential (primary) hypertension: Secondary | ICD-10-CM | POA: Insufficient documentation

## 2016-12-01 DIAGNOSIS — Z8551 Personal history of malignant neoplasm of bladder: Secondary | ICD-10-CM | POA: Diagnosis not present

## 2016-12-01 DIAGNOSIS — E860 Dehydration: Secondary | ICD-10-CM | POA: Diagnosis not present

## 2016-12-01 DIAGNOSIS — N200 Calculus of kidney: Secondary | ICD-10-CM | POA: Diagnosis not present

## 2016-12-01 DIAGNOSIS — J069 Acute upper respiratory infection, unspecified: Secondary | ICD-10-CM | POA: Insufficient documentation

## 2016-12-01 DIAGNOSIS — N390 Urinary tract infection, site not specified: Secondary | ICD-10-CM | POA: Diagnosis not present

## 2016-12-01 DIAGNOSIS — R05 Cough: Secondary | ICD-10-CM | POA: Diagnosis not present

## 2016-12-01 DIAGNOSIS — Z79899 Other long term (current) drug therapy: Secondary | ICD-10-CM | POA: Diagnosis not present

## 2016-12-01 DIAGNOSIS — I2510 Atherosclerotic heart disease of native coronary artery without angina pectoris: Secondary | ICD-10-CM | POA: Diagnosis not present

## 2016-12-01 DIAGNOSIS — Z7982 Long term (current) use of aspirin: Secondary | ICD-10-CM | POA: Diagnosis not present

## 2016-12-01 DIAGNOSIS — R079 Chest pain, unspecified: Secondary | ICD-10-CM | POA: Diagnosis not present

## 2016-12-01 DIAGNOSIS — N281 Cyst of kidney, acquired: Secondary | ICD-10-CM | POA: Diagnosis not present

## 2016-12-01 DIAGNOSIS — B9789 Other viral agents as the cause of diseases classified elsewhere: Secondary | ICD-10-CM

## 2016-12-01 LAB — BASIC METABOLIC PANEL
ANION GAP: 11 (ref 5–15)
BUN: 20 mg/dL (ref 6–20)
CALCIUM: 8.8 mg/dL — AB (ref 8.9–10.3)
CO2: 24 mmol/L (ref 22–32)
Chloride: 97 mmol/L — ABNORMAL LOW (ref 101–111)
Creatinine, Ser: 1.26 mg/dL — ABNORMAL HIGH (ref 0.61–1.24)
GFR calc non Af Amer: 55 mL/min — ABNORMAL LOW (ref >60)
GLUCOSE: 145 mg/dL — AB (ref 65–99)
POTASSIUM: 4 mmol/L (ref 3.5–5.1)
Sodium: 132 mmol/L — ABNORMAL LOW (ref 135–145)

## 2016-12-01 LAB — URINALYSIS, COMPLETE (UACMP) WITH MICROSCOPIC
BILIRUBIN URINE: NEGATIVE
GLUCOSE, UA: NEGATIVE mg/dL
Ketones, ur: NEGATIVE mg/dL
NITRITE: NEGATIVE
PH: 5 (ref 5.0–8.0)
Protein, ur: 30 mg/dL — AB
SPECIFIC GRAVITY, URINE: 1.016 (ref 1.005–1.030)

## 2016-12-01 LAB — TROPONIN I
Troponin I: <0.03 ng/mL (ref <0.03)
Troponin I: <0.03 ng/mL (ref <0.03)

## 2016-12-01 LAB — CBC
HEMATOCRIT: 44.4 % (ref 40.0–52.0)
Hemoglobin: 15.4 g/dL (ref 13.0–18.0)
MCH: 33.9 pg (ref 26.0–34.0)
MCHC: 34.6 g/dL (ref 32.0–36.0)
MCV: 98.1 fL (ref 80.0–100.0)
Platelets: 343 10*3/uL (ref 150–440)
RBC: 4.52 MIL/uL (ref 4.40–5.90)
RDW: 14.5 % (ref 11.5–14.5)
WBC: 12.8 10*3/uL — ABNORMAL HIGH (ref 3.8–10.6)

## 2016-12-01 LAB — MAGNESIUM: MAGNESIUM: 2.1 mg/dL (ref 1.7–2.4)

## 2016-12-01 MED ORDER — DEXTROSE 5 % IV SOLN: 1.0000 g | Freq: Once | INTRAVENOUS | Status: DC

## 2016-12-01 MED ORDER — SULFAMETHOXAZOLE-TRIMETHOPRIM 800-160 MG PO TABS: 1.0000 | ORAL_TABLET | Freq: Two times a day (BID) | ORAL | 0 refills | Status: DC

## 2016-12-01 MED ORDER — CEFTRIAXONE SODIUM-DEXTROSE 1-3.74 GM-% IV SOLR
1.0000 g | Freq: Once | INTRAVENOUS | Status: AC
Start: 2016-12-01 — End: 2016-12-01
  Administered 2016-12-01: 1 g via INTRAVENOUS
  Filled 2016-12-01: qty 50

## 2016-12-01 MED ORDER — SODIUM CHLORIDE 0.9 % IV BOLUS (SEPSIS)
1000.0000 mL | Freq: Once | INTRAVENOUS | Status: AC
  Administered 2016-12-01: 1000 mL via INTRAVENOUS

## 2016-12-01 NOTE — ED Notes (Signed)
Rocephin finished att

## 2016-12-01 NOTE — ED Triage Notes (Signed)
Pt c/o chest tightness since Monday. Pt states productive cough at this time, states pain is unchanged by deep inspiration or coughing. Pt states last night began having bilateral hand swelling at this time. Pt also c/o back pain that started last night and urine that is a dark orange color. Pt is alert and oriented and this time, respirations even and unlabored, skin warm, dry, and intact.

## 2016-12-01 NOTE — ED Provider Notes (Signed)
[facility] Emergency Department Provider Note  ____________________________________________  Time seen: Approximately 6:03 PM  I have reviewed the triage vital signs and the nursing notes.   HISTORY  Chief Complaint Chest Pain; Urinary Changes; and Back Pain   HPI Christian [name] is a 76 y.o. male with a history of bladder cancer on remission s/p ileal conduit, CAD s/p MI on Plavix, provoked DVT/PE post op, HTN, HLD, AAA who presents for evaluation of cough, chest tightness, and dark urine. Patient reports 6 days of a cough initially productive of green sputum which is now becoming clear. He reports that he is having coughing fits and usually develops chest tightness associated with these episodes. As soon as he stops coughing the chest tightness goes away. No pleuritic chest pain. No wheezing, no shortness of breath, no fever, no chills, no nausea, no vomiting. Patient reports that he had a few episodes of diarrhea when the cough first started but that has resolved. He has no chest pain at this time. He reports that the cough is getting better. Today he also noticed that his urine has been very dark with debris in his urostomy bag. He also has had mild intermittent bilateral flank pain worse with movement, non at this time. Has a h/I of kidney stones.  Past Medical History:  Diagnosis Date  . Aortic aneurysm ([place])    followed by vascular surgery yearly.  . Bladder cancer [facility])    s/p bladder resection. [place]  . Coronary artery disease    AMI anterior wall 1997; s/p PTCA to LAD 11/1995 [facility].  Marland Kitchen DVT (deep venous thrombosis) ([place]) 10/07/2007  . Glucose intolerance (impaired glucose tolerance)   . Hydrocele   . Hyperlipidemia   . Hypertension   . MI (myocardial infarction) 1997  . Nephrolithiasis   . Sleep apnea     Patient Active Problem List   Diagnosis Date Noted  . Other abnormal glucose 11/04/2013  . Bladder cancer ([place]) 11/04/2013  . Hyperlipidemia  07/16/2010  . OBESITY-MORBID (>100') 07/16/2010  . CAD, NATIVE VESSEL 07/16/2010  . Abdominal aortic aneurysm ([place]) 07/16/2010    Past Surgical History:  Procedure Laterality Date  . [place]  . bladder removed    . CARDIAC CATHETERIZATION    . COLONOSCOPY  08/27/2011   normal.  [facility].  Marland Kitchen HYDROCELE EXCISION    . PROSTATE SURGERY     prostatectomy with bladder resection.    Prior to Admission medications   Medication Sig Start Date End Date Taking? Authorizing Provider  aspirin (ASPIR-LOW) 81 MG EC tablet Take 81 mg by mouth daily.      Historical Provider, MD  atorvastatin (LIPITOR) 10 MG tablet Take 1 tablet (10 mg total) by mouth daily at 6 PM. 04/16/16   [name], MD  clopidogrel (PLAVIX) 75 MG tablet Take 1 tablet (75 mg total) by mouth daily. 04/16/16   [name], MD  nitroGLYCERIN (NITROSTAT) 0.4 MG SL tablet Place 1 tablet (0.4 mg total) under the tongue every 5 (five) minutes as needed for chest pain. 04/16/16   [name], MD  Omega-3 Fatty Acids (RA FISH OIL) 1000 MG CAPS Take by mouth. Take 2 tabs in morning and 2 tabs i afternoon     Historical Provider, MD  ramipril (ALTACE) 10 MG capsule Take 1 capsule (10 mg total) by mouth daily. 04/16/16   [name], MD  sulfamethoxazole-trimethoprim (BACTRIM DS,SEPTRA DS) 800-160  MG tablet Take 1 tablet by mouth 2 (two) times daily. 12/01/16 12/11/16  Rudene Re, MD    Allergies Patient has no known allergies.  Family History  Problem Relation Age of Onset  . Stroke Mother   . Diabetes Mother   . Lung cancer Father   . Cancer Father     lung cancer  . Diabetes Sister   . Diabetes Brother     Social History Social History  Substance Use Topics  . Smoking status: Never Smoker  . Smokeless tobacco: Never Used     Comment: tobacco use- no   . Alcohol use No    Review of Systems  Constitutional: Negative for fever. Eyes: Negative for visual changes. ENT:  Negative for sore throat. Neck: No neck pain  Cardiovascular: + chest tightness Respiratory: Negative for shortness of breath. + cough Gastrointestinal: Negative for abdominal pain, vomiting or diarrhea. Genitourinary: Negative for dysuria. + dark urine Musculoskeletal: + b/l flank pain. Skin: Negative for rash. Neurological: Negative for headaches, weakness or numbness. Psych: No SI or HI  ____________________________________________   PHYSICAL EXAM:  VITAL SIGNS: ED Triage Vitals  Enc Vitals Group     BP 12/01/16 1606 137/60     Pulse Rate 12/01/16 1606 (!) 48     Resp 12/01/16 1606 18     Temp 12/01/16 1606 98 F (36.7 C)     Temp Source 12/01/16 1606 Oral     SpO2 12/01/16 1606 93 %     Weight 12/01/16 1559 285 lb (129.3 kg)     Height 12/01/16 1559 5\' 11"  (1.803 m)     Head Circumference --      Peak Flow --      Pain Score 12/01/16 1559 3     Pain Loc --      Pain Edu? --      Excl. in [place]? --     Constitutional: Alert and oriented. Well appearing and in no apparent distress. HEENT:      Head: Normocephalic and atraumatic.         Eyes: Conjunctivae are normal. Sclera is non-icteric. EOMI. PERRL      Mouth/Throat: Mucous membranes are moist.       Neck: Supple with no signs of meningismus. Cardiovascular: Irregular rhythm with normal rate. No murmurs, gallops, or rubs. 2+ symmetrical distal pulses are present in all extremities. No JVD. Respiratory: Normal respiratory effort. Lungs are clear to auscultation bilaterally. No wheezes, crackles, or rhonchi.  Gastrointestinal: Soft, non tender, and non distended with positive bowel sounds. No rebound or guarding. Genitourinary: No CVA tenderness. Urostomy bag in place with dark urine and debris Musculoskeletal: Nontender with normal range of motion in all extremities. No edema, cyanosis, or erythema of extremities. Neurologic: Normal speech and language. Face is symmetric. Moving all extremities. No gross focal  neurologic deficits are appreciated. Skin: Skin is warm, dry and intact. No rash noted. Psychiatric: Mood and affect are normal. Speech and behavior are normal.  ____________________________________________   LABS (all labs ordered are listed, but only abnormal results are displayed)  Labs Reviewed  BASIC METABOLIC PANEL - Abnormal; Notable for the following:       Result Value   Sodium 132 (*)    Chloride 97 (*)    Glucose, Bld 145 (*)    Creatinine, Ser 1.26 (*)    Calcium 8.8 (*)    GFR calc non Af Amer 55 (*)    All other components within normal limits  CBC - Abnormal; Notable for the following:    WBC 12.8 (*)    All other components within normal limits  URINALYSIS, COMPLETE (UACMP) WITH MICROSCOPIC - Abnormal; Notable for the following:    Color, Urine AMBER (*)    APPearance CLOUDY (*)    Hgb urine dipstick SMALL (*)    Protein, ur 30 (*)    Leukocytes, UA MODERATE (*)    Bacteria, UA FEW (*)    Squamous Epithelial / LPF 0-5 (*)    Non Squamous Epithelial 0-5 (*)    All other components within normal limits  TROPONIN I  TROPONIN I  MAGNESIUM  TROPONIN I   ____________________________________________  EKG  ED ECG REPORT I, Rudene Re, the attending physician, personally viewed and interpreted this ECG.  Normal sinus rhythm with frequent PACs, rate of 83, prolonged QTC, normal axis, no ST elevations or depressions.   20:01 - Normal sinus rhythm, rate of 82, normal intervals, normal axis, rare PACs, no STE or depression ____________________________________________  RADIOLOGY  CT renal: 1. Nonobstructing bilateral renal calculi and likely contributing to the patient's history of hematuria. To these calculi measure on the order of 2 mm each. 2. Cystectomy and prostatectomy with ileal conduit noted. No bowel obstruction or inflammation. No findings of metastatic disease. 3. Coronary arteriosclerosis. 4. Cyst in the lower pole the right kidney of  water attenuation measuring 2 cm. 5. Tiny 2 mm left hepatic hypodensity too small to characterize but statistically consistent with a cyst or hemangioma.   CXR: Negative ____________________________________________   PROCEDURES  Procedure(s) performed: None Procedures Critical Care performed:  None ____________________________________________   INITIAL IMPRESSION / ASSESSMENT AND PLAN / ED COURSE  76 y.o. male with a history of bladder cancer on remission s/p ileal conduit, CAD s/p MI on Plavix, provoked DVT/PE post op, HTN, HLD, AAA who presents for evaluation of cough, chest tightness, and dark urine.  # cough and chest tightness: Patient is in no apparent distress, has normal vital signs, normal work of breathing, normal sats, lungs are clear to auscultation. Presentation concerning for bronchitis. Chest x-ray with no evidence of ischemia. Description of pain is atypical for ACS as it is only present during coughing fits. Initial EKG with no ischemic changes. We'll cycle cardiac enzymes and watch patient on telemetry. Patient had diarrhea together with his coughing this is most likely viral therefore we'll not treat with antibiotics at this time.  # Dark urine: UA concerning for urinary tract infection. Patient has mild leukocytosis with white count of 12. We'll give a dose of IV ceftriaxone and start patient on Bactrim. Will give IVF as patients creatinine is mildly elevated. Will pursue CT renal to rule out kidney stones.    _________________________ 8:26 PM on 12/01/2016 -----------------------------------------  Patient remains stable with no chest pain. Serial Troponin negative. Repeat EKG with no evidence of ischemia and improvement of frequent PACs after IV fluids and antibiotics. Labs showing mild AKI, patient given IVF. Patient is going to be discharged home on Bactrim twice a day 10 days with close follow-up with primary care doctor. CT showing renal stones but no stones in  the ureters.  Pertinent labs & imaging results that were available during my care of the patient were reviewed by me and considered in my medical decision making (see chart for details).    ____________________________________________   FINAL CLINICAL IMPRESSION(S) / ED DIAGNOSES  Final diagnoses:  Complicated UTI (urinary tract infection)  Viral URI with cough  Dehydration  NEW MEDICATIONS STARTED DURING THIS VISIT:  New Prescriptions   SULFAMETHOXAZOLE-TRIMETHOPRIM (BACTRIM DS,SEPTRA DS) 800-160 MG TABLET    Take 1 tablet by mouth 2 (two) times daily.     Note:  This document was prepared using Dragon voice recognition software and may include unintentional dictation errors.    Rudene Re, MD 12/01/16 2027

## 2016-12-01 NOTE — Discharge Instructions (Signed)

## 2016-12-03 NOTE — Progress Notes (Signed)
Cardiology Office Note  Date:  12/04/2016   ID:  Christian [name], DOB [birth year], MRN [id number]  PCP:  Reginia Forts, MD   Chief Complaint  Patient presents with  . other    Pt seen in ED due to congestion denies chest pain or irregular heart beat c/o pain from coughing. Meds reviewed verbally with pt.    HPI:  Christian [name] is a very pleasant 76 year old gentleman with a history of  obesity , CAD,  moderate disease in a diagonal vessel, RCA and proximal circumflex, obtuse marginal branch with normal systolic function, diastolic dysfunction on echocardiogram,  DVT and PE,  Bladder surgery,  obstructive sleep apnea, hypertension,  hyperlipidemia,  small AAA,  who presents for routine followup of his coronary artery disease  Went to the ER 12/01/2016 Urine darker, Pain in ABD from coughing He had CT scan of his abdomen showing Kidney stone, 2 mm Was told he had Hematuria, Possible urinary tract infection He was placed on Bactrim 10 days  Recent follow-up with Dr. Jacqlyn Larsen, no urinary tract infection May need procedure to look for source of hematuria  Was told by emergency room doctor that he had arrhythmia, needed to follow-up with cardiology  Significant bronchitis over the past several weeks Was told by emergency room that Bactrim would also help his bronchitis Significant coughing, sputum, green and yellow color, no improvement over the past several weeks Scheduled to be seen with primary care next week  Total cholesterol 150, LDL 70 on low-dose Lipitor  EKG personally reviewed by myself on todays visit shows normal sinus rhythm with rate 73 bpm, no significant ST or T-wave changes, Frequent APCs sometimes in a bigeminal pattern  Other past medical history Last stress test was in 03/2008. He had an inferior wall defect consistent with ischemia. Significant GI uptake artifact noted in the inferior wall. EF 42%. No cardiac cath was performed at that time in follow  up.  ECHO in 02/2008 shows normal EF, otherwise normal study  PMH:   has a past medical history of Aortic aneurysm ([place]); Bladder cancer ([place]); Coronary artery disease; DVT (deep venous thrombosis) ([place]) (10/07/2007); Glucose intolerance (impaired glucose tolerance); Hydrocele; Hyperlipidemia; Hypertension; MI (myocardial infarction) (1997); Nephrolithiasis; and Sleep apnea.  PSH:    Past Surgical History:  Procedure Laterality Date  . [place]  . bladder removed    . CARDIAC CATHETERIZATION    . COLONOSCOPY  08/27/2011   normal.  [facility].  Marland Kitchen HYDROCELE EXCISION    . PROSTATE SURGERY     prostatectomy with bladder resection.    Current Outpatient Prescriptions  Medication Sig Dispense Refill  . aspirin (ASPIR-LOW) 81 MG EC tablet Take 81 mg by mouth daily.      Marland Kitchen atorvastatin (LIPITOR) 10 MG tablet Take 1 tablet (10 mg total) by mouth daily at 6 PM. 90 tablet 3  . clopidogrel (PLAVIX) 75 MG tablet Take 1 tablet (75 mg total) by mouth daily. 90 tablet 3  . nitroGLYCERIN (NITROSTAT) 0.4 MG SL tablet Place 1 tablet (0.4 mg total) under the tongue every 5 (five) minutes as needed for chest pain. 25 tablet 6  . Omega-3 Fatty Acids (RA FISH OIL) 1000 MG CAPS Take by mouth. Take 2 tabs in morning and 2 tabs i afternoon     . ramipril (ALTACE) 10 MG capsule Take 1 capsule (10 mg total) by mouth daily. 90 capsule 3  . sulfamethoxazole-trimethoprim (BACTRIM DS,SEPTRA DS) 800-160 MG tablet Take  1 tablet by mouth 2 (two) times daily.    Marland Kitchen azithromycin (ZITHROMAX Z-PAK) 250 MG tablet Take 2 pills today, then 1 pill a day until finished 6 each 0  . traMADol (ULTRAM) 50 MG tablet Take 1 tablet (50 mg total) by mouth every 6 (six) hours as needed for moderate pain. 60 tablet 0   No current facility-administered medications for this visit.      Allergies:   Ceftriaxone   Social History:  The patient  reports that he has never smoked. He has never used smokeless  tobacco. He reports that he does not drink alcohol or use drugs.   Family History:   family history includes Cancer in his father; Diabetes in his brother, mother, and sister; Lung cancer in his father; Stroke in his mother.    Review of Systems: Review of Systems  Constitutional: Negative.   Respiratory: Positive for cough and sputum production.   Cardiovascular: Negative.   Gastrointestinal: Negative.   Musculoskeletal: Negative.   Neurological: Negative.   Psychiatric/Behavioral: Negative.   All other systems reviewed and are negative.    PHYSICAL EXAM: VS:  BP 124/70 (BP Location: Left Arm, Patient Position: Sitting, Cuff Size: Normal)   Pulse 73   Ht 5\' 11"  (1.803 m)   Wt 285 lb 12 oz (129.6 kg)   BMI 39.85 kg/m  , BMI Body mass index is 39.85 kg/m. GEN: Well nourished, well developed, in no acute distress  HEENT: normal  Neck: no JVD, carotid bruits, or masses Cardiac: RRR; no murmurs, rubs, or gallops,no edema  Respiratory:  Scattered wheezes, dullness at the base on the right, normal work of breathing GI: soft, nontender, nondistended, + BS MS: no deformity or atrophy  Skin: warm and dry, no rash Neuro:  Strength and sensation are intact Psych: euthymic mood, full affect    Recent Labs: 07/31/2016: ALT 24 12/01/2016: BUN 20; Creatinine, Ser 1.26; Hemoglobin 15.4; Magnesium 2.1; Platelets 343; Potassium 4.0; Sodium 132    Lipid Panel Lab Results  Component Value Date   CHOL 144 07/31/2016   HDL 36 (L) 07/31/2016   LDLCALC 66 07/31/2016   TRIG 211 (H) 07/31/2016      Wt Readings from Last 3 Encounters:  12/04/16 285 lb 12 oz (129.6 kg)  12/01/16 285 lb (129.3 kg)  07/31/16 281 lb (127.5 kg)       ASSESSMENT AND PLAN:  Mixed hyperlipidemia - Plan: EKG 12-Lead Cholesterol adequate We'll need to monitor closely and given the extent of his aortic atherosclerosis, we could consider more aggressive approach to his cholesterol and increase Lipitor up to  20 minute grams daily. We'll discuss with him in follow-up. He has difficulty losing weight, no regular exercise program  Atherosclerosis of native coronary artery of native heart with stable angina pectoris ([place]) - Plan: EKG 12-Lead Currently with no symptoms of angina. No further workup at this time. Continue current medication regimen.  OBESITY-MORBID (>100') - Plan: EKG 12-Lead We have encouraged continued exercise, careful diet management in an effort to lose weight.  Abdominal aortic aneurysm (AAA) without rupture ([place]) - Plan: EKG 12-Lead Stable in size, minimally dilated  Acute bronchitis, unspecified organism His biggest complaint is coughing, sputum, insomnia from coughing Recommended he start Z-Pak for 5 days For cough suggested he try tramadol as his other cough medication was not working  Aortic atherosclerosis ([place]) CT scan reviewed showing moderate distal aortic and iliac arterial disease Coronary calcifications also noted Need to be very aggressive with  his cholesterol to me as above may want to consider increasing Lipitor up to 20   Total encounter time more than 25 minutes  Greater than 50% was spent in counseling and coordination of care with the patient   Disposition:   F/U  12 months   Orders Placed This Encounter  Procedures  . EKG 12-Lead     Signed, Esmond Plants, M.D., Ph.D. 12/04/2016  [place], [place]

## 2016-12-04 ENCOUNTER — Ambulatory Visit (INDEPENDENT_AMBULATORY_CARE_PROVIDER_SITE_OTHER): Payer: Medicare HMO | Admitting: Cardiovascular Disease

## 2016-12-04 ENCOUNTER — Encounter: Payer: Self-pay | Admitting: Cardiovascular Disease

## 2016-12-04 VITALS — BP 124/70 | HR 73 | Ht 71.0 in | Wt 285.8 lb

## 2016-12-04 DIAGNOSIS — J209 Acute bronchitis, unspecified: Secondary | ICD-10-CM

## 2016-12-04 DIAGNOSIS — K760 Fatty (change of) liver, not elsewhere classified: Secondary | ICD-10-CM | POA: Diagnosis not present

## 2016-12-04 DIAGNOSIS — R3129 Other microscopic hematuria: Secondary | ICD-10-CM | POA: Diagnosis not present

## 2016-12-04 DIAGNOSIS — I25118 Atherosclerotic heart disease of native coronary artery with other forms of angina pectoris: Secondary | ICD-10-CM | POA: Diagnosis not present

## 2016-12-04 DIAGNOSIS — E782 Mixed hyperlipidemia: Secondary | ICD-10-CM | POA: Diagnosis not present

## 2016-12-04 DIAGNOSIS — I700 Atherosclerosis of aorta: Secondary | ICD-10-CM | POA: Diagnosis not present

## 2016-12-04 DIAGNOSIS — Z86718 Personal history of other venous thrombosis and embolism: Secondary | ICD-10-CM | POA: Diagnosis not present

## 2016-12-04 DIAGNOSIS — I714 Abdominal aortic aneurysm, without rupture, unspecified: Secondary | ICD-10-CM

## 2016-12-04 DIAGNOSIS — I2510 Atherosclerotic heart disease of native coronary artery without angina pectoris: Secondary | ICD-10-CM | POA: Diagnosis not present

## 2016-12-04 DIAGNOSIS — Z8551 Personal history of malignant neoplasm of bladder: Secondary | ICD-10-CM | POA: Diagnosis not present

## 2016-12-04 DIAGNOSIS — N200 Calculus of kidney: Secondary | ICD-10-CM | POA: Diagnosis not present

## 2016-12-04 DIAGNOSIS — C679 Malignant neoplasm of bladder, unspecified: Secondary | ICD-10-CM | POA: Diagnosis not present

## 2016-12-04 DIAGNOSIS — I10 Essential (primary) hypertension: Secondary | ICD-10-CM | POA: Diagnosis not present

## 2016-12-04 DIAGNOSIS — Z720 Tobacco use: Secondary | ICD-10-CM | POA: Diagnosis not present

## 2016-12-04 MED ORDER — AZITHROMYCIN 250 MG PO TABS: ORAL_TABLET | ORAL | 0 refills | Status: DC

## 2016-12-04 MED ORDER — TRAMADOL HCL 50 MG PO TABS: 50.0000 mg | ORAL_TABLET | Freq: Four times a day (QID) | ORAL | 0 refills | Status: DC | PRN

## 2016-12-04 NOTE — Patient Instructions (Addendum)
Medication Instructions:   Please start zpak 2 pills the first day then one pill daily after that (5 pills total)   Tramadol as needed every 6 hours for cough  Labwork:  No new labs needed  Testing/Procedures:  No further testing at this time   I recommend watching educational videos on topics of interest to you at:       [email or web address].com  Enter code: HEARTCARE    Follow-Up: It was a pleasure seeing you in the office today. Please call us if you have new issues that need to be addressed before your next appt.  [phone]  Your physician wants you to follow-up in: 6 months.  You will receive a reminder letter in the mail two months in advance. If you don't receive a letter, please call our office to schedule the follow-up appointment.  If you need a refill on your cardiac medications before your next appointment, please call your pharmacy.

## 2016-12-06 DIAGNOSIS — G4733 Obstructive sleep apnea (adult) (pediatric): Secondary | ICD-10-CM | POA: Diagnosis not present

## 2016-12-09 DIAGNOSIS — Z8551 Personal history of malignant neoplasm of bladder: Secondary | ICD-10-CM | POA: Diagnosis not present

## 2016-12-09 DIAGNOSIS — R3129 Other microscopic hematuria: Secondary | ICD-10-CM | POA: Diagnosis not present

## 2016-12-12 ENCOUNTER — Ambulatory Visit (INDEPENDENT_AMBULATORY_CARE_PROVIDER_SITE_OTHER): Payer: Medicare HMO

## 2016-12-12 ENCOUNTER — Ambulatory Visit (INDEPENDENT_AMBULATORY_CARE_PROVIDER_SITE_OTHER): Payer: Medicare HMO | Admitting: Family Medicine

## 2016-12-12 ENCOUNTER — Ambulatory Visit: Payer: Medicare HMO

## 2016-12-12 VITALS — BP 153/77 | HR 64 | Temp 98.0°F | Resp 17 | Ht 71.0 in | Wt 284.0 lb

## 2016-12-12 DIAGNOSIS — R002 Palpitations: Secondary | ICD-10-CM | POA: Diagnosis not present

## 2016-12-12 DIAGNOSIS — E7439 Other disorders of intestinal carbohydrate absorption: Secondary | ICD-10-CM | POA: Diagnosis not present

## 2016-12-12 DIAGNOSIS — R059 Cough, unspecified: Secondary | ICD-10-CM

## 2016-12-12 DIAGNOSIS — I2510 Atherosclerotic heart disease of native coronary artery without angina pectoris: Secondary | ICD-10-CM | POA: Diagnosis not present

## 2016-12-12 DIAGNOSIS — R05 Cough: Secondary | ICD-10-CM | POA: Diagnosis not present

## 2016-12-12 DIAGNOSIS — G933 Postviral fatigue syndrome: Secondary | ICD-10-CM | POA: Diagnosis not present

## 2016-12-12 DIAGNOSIS — R310 Gross hematuria: Secondary | ICD-10-CM

## 2016-12-12 DIAGNOSIS — R7309 Other abnormal glucose: Secondary | ICD-10-CM | POA: Diagnosis not present

## 2016-12-12 DIAGNOSIS — G9331 Postviral fatigue syndrome: Secondary | ICD-10-CM

## 2016-12-12 LAB — POCT CBC
Granulocyte percent: 57.8 %G (ref 37–80)
HCT, POC: 40.7 % — AB (ref 43.5–53.7)
Hemoglobin: 14.2 g/dL (ref 14.1–18.1)
LYMPH, POC: 2.4 (ref 0.6–3.4)
MCH, POC: 34.6 pg — AB (ref 27–31.2)
MCHC: 34.9 g/dL (ref 31.8–35.4)
MCV: 99.3 fL — AB (ref 80–97)
MID (CBC): 0.6 (ref 0–0.9)
MPV: 7.6 fL (ref 0–99.8)
POC GRANULOCYTE: 4 (ref 2–6.9)
POC LYMPH %: 34.1 % (ref 10–50)
POC MID %: 8.1 % (ref 0–12)
Platelet Count, POC: 305 10*3/uL (ref 142–424)
RBC: 4.1 M/uL — AB (ref 4.69–6.13)
RDW, POC: 14.5 %
WBC: 6.9 10*3/uL (ref 4.6–10.2)

## 2016-12-12 LAB — GLUCOSE, POCT (MANUAL RESULT ENTRY): POC GLUCOSE: 113 mg/dL — AB (ref 70–99)

## 2016-12-12 MED ORDER — DOXYCYCLINE HYCLATE 100 MG PO CAPS: 100.0000 mg | ORAL_CAPSULE | Freq: Two times a day (BID) | ORAL | 0 refills | Status: DC

## 2016-12-12 MED ORDER — PREDNISONE 20 MG PO TABS: ORAL_TABLET | ORAL | 0 refills | Status: DC

## 2016-12-12 NOTE — Progress Notes (Signed)
Subjective:    Patient ID: Christian [name], male    DOB: 1940-09-29, 76 y.o.   MRN: [id number]  12/12/2016  Fatigue (Referred by Dr. Rexene Alberts notes/ wants blood work done on patient)   HPI This 76 y.o. male presents for evaluation of fatigue.  Came back from the beach; for two weeks, fought URI.  Started taking Alkeseltzer which usually helps; started getting better; then third week into cold, started noticing that had blood in urine.  Called Dr. Bjorn Loser office; scheduled one week out.  Then started feeling worse so wife took to ED.  s/p CXR; CT scan on kidneys. Dx with dehydration and UTI; gave ivf; felt hematuria coming from two tiny kidney stones in each kidney.  Gave iv abx; when started iv abx, got really sick and started vomiting.  Then stopped it.  Then started a really slow drip of Ceftriaxone/Rocephin.  Then gave SULFA based drug as rx; had it filled; developed horrible rash on SULFA.  Prescribed Benadryl.  Saw Dr. Jacqlyn Larsen; did not feel that patient was suffering with UTI; recommended stopping medication.  s/p cystoscopy with Dr. Jacqlyn Larsen.   Developed irregular heart beat; saw Dr. Rockey Situ who felt that nothing to worry about unless developed chest pressure, shortness of breath, tingling in extremities.    Dr. Jacqlyn Larsen recommended appointment with Dr. Tamala Julian.  Dr. Jacqlyn Larsen recommended an extensive blood panel including TSH.  No energy.  No pain; onset of severe fatigue in end of March.  Did run fever with onset of acute illness; ran fever for one week intermittent.  Stil congested in chest.  No higher than 100F.  Has suffered with palpitations since acute illness.  s/p EKG with [place]; did not fele that extra beats were worrisome.     Review of Systems  Constitutional: Positive for fatigue. Negative for activity change, appetite change, chills, diaphoresis and fever.  Eyes: Negative for visual disturbance.  Respiratory: Positive for cough. Negative for shortness of breath.   Cardiovascular: Negative for  chest pain, palpitations and leg swelling.  Gastrointestinal: Negative for abdominal pain, diarrhea, nausea and vomiting.  Endocrine: Negative for cold intolerance, heat intolerance, polydipsia, polyphagia and polyuria.  Genitourinary: Positive for hematuria.  Musculoskeletal: Negative for arthralgias.  Neurological: Negative for dizziness, tremors, seizures, syncope, facial asymmetry, speech difficulty, weakness, light-headedness, numbness and headaches.    Past Medical History:  Diagnosis Date  . Aortic aneurysm ([place])    followed by vascular surgery yearly.  . Bladder cancer [facility])    s/p bladder resection. [place]  . Coronary artery disease    AMI anterior wall 1997; s/p PTCA to LAD 11/1995 [facility].  Marland Kitchen DVT (deep venous thrombosis) ([place]) 10/07/2007  . Glucose intolerance (impaired glucose tolerance)   . Hydrocele   . Hyperlipidemia   . Hypertension   . MI (myocardial infarction) ([place]) 1997  . Nephrolithiasis   . Sleep apnea    Past Surgical History:  Procedure Laterality Date  . [place]  . bladder removed    . CARDIAC CATHETERIZATION    . COLONOSCOPY  08/27/2011   normal.  [facility].  Marland Kitchen HYDROCELE EXCISION    . PROSTATE SURGERY     prostatectomy with bladder resection.   Allergies  Allergen Reactions  . Ceftriaxone Hives  . Sulfa Antibiotics Hives    Social History   Social History  . Marital status: Married    Spouse name: N/A  . Number of children: N/A  . Years of education:  N/A   Occupational History  . retired    Social History Main Topics  . Smoking status: Never Smoker  . Smokeless tobacco: Never Used     Comment: tobacco use- no   . Alcohol use No  . Drug use: No  . Sexual activity: Yes   Other Topics Concern  . Not on file   Social History Narrative   Marital status:  Married x 30 years; happily married; third marriage.      Children: none; 2 stepdaughters; 3 grandchildren.  3 gg.      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      Tobacco:  None      Alcohol:  None      Drugs: none     Exercise: Regularly exercise/walking; walking daily.      ADLs: independent with ADLs.        Advanced Directives:  Yes; FULL CODE.  No prolonged measures.      Seatbelt: 100%         Family History  Problem Relation Age of Onset  . Stroke Mother   . Diabetes Mother   . Lung cancer Father   . Cancer Father     lung cancer  . Diabetes Sister   . Diabetes Brother        Objective:    BP (!) 153/77 (BP Location: Right Arm, Patient Position: Sitting, Cuff Size: Large)   Pulse 64   Temp 98 F (36.7 C) (Oral)   Resp 17   Ht 5\' 11"  (1.803 m)   Wt 284 lb (128.8 kg)   SpO2 94%   BMI 39.61 kg/m  Physical Exam  Constitutional: He is oriented to person, place, and time. He appears well-developed and well-nourished. No distress.  HENT:  Head: Normocephalic and atraumatic.  Right Ear: External ear normal.  Left Ear: External ear normal.  Nose: Nose normal.  Mouth/Throat: Oropharynx is clear and moist.  Eyes: Conjunctivae and EOM are normal. Pupils are equal, round, and reactive to light.  Neck: Normal range of motion. Neck supple. Carotid bruit is not present. No thyromegaly present.  Cardiovascular: Normal rate, regular rhythm, normal heart sounds and intact distal pulses.  Exam reveals no gallop and no friction rub.   No murmur heard. Pulmonary/Chest: Effort normal and breath sounds normal. He has no wheezes. He has no rales.  Abdominal: Soft. Bowel sounds are normal. He exhibits no distension and no mass. There is no tenderness. There is no rebound and no guarding.  Lymphadenopathy:    He has no cervical adenopathy.  Neurological: He is alert and oriented to person, place, and time. No cranial nerve deficit.  Skin: Skin is warm and dry. No rash noted. He is not diaphoretic.  Psychiatric: He has a normal mood and affect. His behavior is normal.  Nursing note and vitals  reviewed.  Results for orders placed or performed in visit on 12/12/16  POCT CBC  Result Value Ref Range   WBC 6.9 4.6 - 10.2 K/uL   Lymph, poc 2.4 0.6 - 3.4   POC LYMPH PERCENT 34.1 10 - 50 %L   MID (cbc) 0.6 0 - 0.9   POC MID % 8.1 0 - 12 %M   POC Granulocyte 4.0 2 - 6.9   Granulocyte percent 57.8 37 - 80 %G   RBC 4.10 (A) 4.69 - 6.13 M/uL   Hemoglobin 14.2 14.1 - 18.1 g/dL   HCT, POC 40.7 (A)  43.5 - 53.7 %   MCV 99.3 (A) 80 - 97 fL   MCH, POC 34.6 (A) 27 - 31.2 pg   MCHC 34.9 31.8 - 35.4 g/dL   RDW, POC 14.5 %   Platelet Count, POC 305 142 - 424 K/uL   MPV 7.6 0 - 99.8 fL  POCT glucose (manual entry)  Result Value Ref Range   POC Glucose 113 (A) 70 - 99 mg/dl       Assessment & Plan:   1. Postviral fatigue syndrome   2. Gross hematuria   3. Palpitations   4. Cough   5. Glucose intolerance    -new onset severe fatigue and cough; CXR negative for infiltrate.   Rx for Doxycycline and Prednisone provided. -s/p EKG in office today; no associated chest pain, DOE, or dizziness. -obtain labs including urine culture to rule out secondary causes of fatigue. -symptoms most consistent with post-viral syndrome fatigue and residual cough that can last three weeks; Prednisone will treat cough.    Orders Placed This Encounter  Procedures  . Urine culture    Order Specific Question:   Source    Answer:   urostomy bag  . DG Chest 2 View    Standing Status:   Future    Number of Occurrences:   1    Standing Expiration Date:   12/12/2017    Order Specific Question:   Reason for Exam (SYMPTOM  OR DIAGNOSIS REQUIRED)    Answer:   cough, fatigue, fever, palpitations, CAD    Order Specific Question:   Preferred imaging location?    Answer:   External  . Comprehensive metabolic panel  . TSH  . Magnesium  . POCT CBC  . POCT glucose (manual entry)  . EKG 12-Lead   Meds ordered this encounter  Medications  . doxycycline (VIBRAMYCIN) 100 MG capsule    Sig: Take 1 capsule (100 mg  total) by mouth 2 (two) times daily.    Dispense:  20 capsule    Refill:  0  . predniSONE (DELTASONE) 20 MG tablet    Sig: 2 PO QAM x 5 days, 1 PO QAM x 5 days    Dispense:  15 tablet    Refill:  0    No Follow-up on file.   Eulonda Andalon Elayne Guerin, M.D. Primary Care at [facility] previously [facility] [address] [place], [place]  [address] [phone] phone [phone] fax

## 2016-12-12 NOTE — Patient Instructions (Addendum)
Take Tramadol with Delsym for cough.  Take Doxycycline twice daily. Take Prednisone as prescribed. If you are not feeling better in two weeks, I recommend follow-up with me and Dr. Rockey Situ.    Acute Bronchitis, Adult Acute bronchitis is sudden (acute) swelling of the air tubes (bronchi) in the lungs. Acute bronchitis causes these tubes to fill with mucus, which can make it hard to breathe. It can also cause coughing or wheezing. In adults, acute bronchitis usually goes away within 2 weeks. A cough caused by bronchitis may last up to 3 weeks. Smoking, allergies, and asthma can make the condition worse. Repeated episodes of bronchitis may cause further lung problems, such as chronic obstructive pulmonary disease (COPD). What are the causes? This condition can be caused by germs and by substances that irritate the lungs, including:  Cold and flu viruses. This condition is most often caused by the same virus that causes a cold.  Bacteria.  Exposure to tobacco smoke, dust, fumes, and air pollution. What increases the risk? This condition is more likely to develop in people who:  Have close contact with someone with acute bronchitis.  Are exposed to lung irritants, such as tobacco smoke, dust, fumes, and vapors.  Have a weak immune system.  Have a respiratory condition such as asthma. What are the signs or symptoms? Symptoms of this condition include:  A cough.  Coughing up clear, yellow, or green mucus.  Wheezing.  Chest congestion.  Shortness of breath.  A fever.  Body aches.  Chills.  A sore throat. How is this diagnosed? This condition is usually diagnosed with a physical exam. During the exam, your health care provider may order tests, such as chest X-rays, to rule out other conditions. He or she may also:  Test a sample of your mucus for bacterial infection.  Check the level of oxygen in your blood. This is done to check for pneumonia.  Do a chest X-ray or lung  function testing to rule out pneumonia and other conditions.  Perform blood tests. Your health care provider will also ask about your symptoms and medical history. How is this treated? Most cases of acute bronchitis clear up over time without treatment. Your health care provider may recommend:  Drinking more fluids. Drinking more makes your mucus thinner, which may make it easier to breathe.  Taking a medicine for a fever or cough.  Taking an antibiotic medicine.  Using an inhaler to help improve shortness of breath and to control a cough.  Using a cool mist vaporizer or humidifier to make it easier to breathe. Follow these instructions at home: Medicines   Take over-the-counter and prescription medicines only as told by your health care provider.  If you were prescribed an antibiotic, take it as told by your health care provider. Do not stop taking the antibiotic even if you start to feel better. General instructions   Get plenty of rest.  Drink enough fluids to keep your urine clear or pale yellow.  Avoid smoking and secondhand smoke. Exposure to cigarette smoke or irritating chemicals will make bronchitis worse. If you smoke and you need help quitting, ask your health care provider. Quitting smoking will help your lungs heal faster.  Use an inhaler, cool mist vaporizer, or humidifier as told by your health care provider.  Keep all follow-up visits as told by your health care provider. This is important. How is this prevented? To lower your risk of getting this condition again:  Wash your hands often  with soap and water. If soap and water are not available, use hand sanitizer.  Avoid contact with people who have cold symptoms.  Try not to touch your hands to your mouth, nose, or eyes.  Make sure to get the flu shot every year. Contact a health care provider if:  Your symptoms do not improve in 2 weeks of treatment. Get help right away if:  You cough up blood.  You  have chest pain.  You have severe shortness of breath.  You become dehydrated.  You faint or keep feeling like you are going to faint.  You keep vomiting.  You have a severe headache.  Your fever or chills gets worse. This information is not intended to replace advice given to you by your health care provider. Make sure you discuss any questions you have with your health care provider. Document Released: 09/19/2004 Document Revised: 03/06/2016 Document Reviewed: 01/31/2016 Elsevier Interactive Patient Education  2017 [employer].     IF you received an x-ray today, you will receive an invoice from [facility]. Please contact [facility] at [phone] with questions or concerns regarding your invoice.   IF you received labwork today, you will receive an invoice from [facility]. Please contact [facility] at [phone] with questions or concerns regarding your invoice.   Our billing staff will not be able to assist you with questions regarding bills from these companies.  You will be contacted with the lab results as soon as they are available. The fastest way to get your results is to activate your My Chart account. Instructions are located on the last page of this paperwork. If you have not heard from [place] regarding the results in 2 weeks, please contact this office.

## 2016-12-13 LAB — COMPREHENSIVE METABOLIC PANEL
ALBUMIN: 4.3 g/dL (ref 3.5–4.8)
ALT: 32 IU/L (ref 0–44)
AST: 24 IU/L (ref 0–40)
Albumin/Globulin Ratio: 1.5 (ref 1.2–2.2)
Alkaline Phosphatase: 53 IU/L (ref 39–117)
BUN / CREAT RATIO: 12 (ref 10–24)
BUN: 10 mg/dL (ref 8–27)
Bilirubin Total: 0.4 mg/dL (ref 0.0–1.2)
CALCIUM: 9 mg/dL (ref 8.6–10.2)
CO2: 22 mmol/L (ref 18–29)
CREATININE: 0.85 mg/dL (ref 0.76–1.27)
Chloride: 99 mmol/L (ref 96–106)
GFR, EST AFRICAN AMERICAN: 100 mL/min/{1.73_m2} (ref >59)
GFR, EST NON AFRICAN AMERICAN: 86 mL/min/{1.73_m2} (ref >59)
GLOBULIN, TOTAL: 2.9 g/dL (ref 1.5–4.5)
Glucose: 117 mg/dL — ABNORMAL HIGH (ref 65–99)
Potassium: 4.2 mmol/L (ref 3.5–5.2)
SODIUM: 141 mmol/L (ref 134–144)
TOTAL PROTEIN: 7.2 g/dL (ref 6.0–8.5)

## 2016-12-13 LAB — TSH: TSH: 2.18 u[IU]/mL (ref 0.450–4.500)

## 2016-12-13 LAB — MAGNESIUM: Magnesium: 2.2 mg/dL (ref 1.6–2.3)

## 2016-12-14 LAB — URINE CULTURE

## 2016-12-25 ENCOUNTER — Ambulatory Visit (INDEPENDENT_AMBULATORY_CARE_PROVIDER_SITE_OTHER): Payer: Medicare HMO | Admitting: Family Medicine

## 2016-12-25 ENCOUNTER — Encounter: Payer: Self-pay | Admitting: Family Medicine

## 2016-12-25 VITALS — BP 118/66 | HR 67 | Temp 98.0°F | Resp 16 | Ht 71.0 in | Wt 282.2 lb

## 2016-12-25 DIAGNOSIS — I714 Abdominal aortic aneurysm, without rupture, unspecified: Secondary | ICD-10-CM

## 2016-12-25 DIAGNOSIS — I2510 Atherosclerotic heart disease of native coronary artery without angina pectoris: Secondary | ICD-10-CM

## 2016-12-25 DIAGNOSIS — E782 Mixed hyperlipidemia: Secondary | ICD-10-CM

## 2016-12-25 DIAGNOSIS — Z0000 Encounter for general adult medical examination without abnormal findings: Secondary | ICD-10-CM

## 2016-12-25 DIAGNOSIS — I10 Essential (primary) hypertension: Secondary | ICD-10-CM

## 2016-12-25 DIAGNOSIS — R7309 Other abnormal glucose: Secondary | ICD-10-CM

## 2016-12-25 DIAGNOSIS — I700 Atherosclerosis of aorta: Secondary | ICD-10-CM

## 2016-12-25 DIAGNOSIS — C673 Malignant neoplasm of anterior wall of bladder: Secondary | ICD-10-CM | POA: Diagnosis not present

## 2016-12-25 MED ORDER — ATORVASTATIN CALCIUM 10 MG PO TABS: 10.0000 mg | ORAL_TABLET | Freq: Every day | ORAL | 3 refills | Status: DC

## 2016-12-25 MED ORDER — CLOPIDOGREL BISULFATE 75 MG PO TABS: 75.0000 mg | ORAL_TABLET | Freq: Every day | ORAL | 3 refills | Status: DC

## 2016-12-25 MED ORDER — RAMIPRIL 10 MG PO CAPS: 10.0000 mg | ORAL_CAPSULE | Freq: Every day | ORAL | 3 refills | Status: DC

## 2016-12-25 NOTE — Progress Notes (Signed)
Subjective:    Patient ID: Christian [name], male    DOB: 11-May-1941, 76 y.o.   MRN: [id number]  12/25/2016  Annual Exam and Immunizations (discussed with patient regarding the Tetanus and Zoster vaccine)   HPI This 76 y.o. male presents for Annual Wellness Examination, Complete Physical Examination, and chronic medical follow-up.  Last physical:  03-01-2015 Colonoscopy:  12.2013; repeat unknown.  PSA: N/A Eye exam:  Dental exam:  Fatigue: feeling much better.    HTN: Patient reports good compliance with medication, good tolerance to medication, and good symptom control.    Hypercholesterolemia: Patient reports good compliance with medication, good tolerance to medication, and good symptom control.    Bladder cancer: stable; followed by Dr. Jacqlyn Larsen every year; s/p extensive evaluation recently for hematuria.  Has not suffered recurrent hematuria.   CAD: followed by Rockey Situ yearly. Patient reports good compliance with medication, good tolerance to medication, and good symptom control.     AAA: followed by vascular surgery annually.  Glucose intolerance: trying to limit carbohydrate intake   Immunization History  Administered Date(s) Administered  . Influenza,inj,Quad PF,36+ Mos 06/02/2013, 10/17/2015, 04/16/2016  . Pneumococcal Conjugate-13 07/31/2016   BP Readings from Last 3 Encounters:  12/25/16 118/66  12/12/16 (!) 153/77  12/04/16 124/70   Wt Readings from Last 3 Encounters:  12/25/16 282 lb 3.2 oz (128 kg)  12/12/16 284 lb (128.8 kg)  12/04/16 285 lb 12 oz (129.6 kg)      Review of Systems  Constitutional: Negative for activity change, appetite change, chills, diaphoresis, fatigue, fever and unexpected weight change.  HENT: Negative for congestion, dental problem, drooling, ear discharge, ear pain, facial swelling, hearing loss, mouth sores, nosebleeds, postnasal drip, rhinorrhea, sinus pressure, sneezing, sore throat, tinnitus, trouble swallowing and voice change.     Eyes: Negative for photophobia, pain, discharge, redness, itching and visual disturbance.  Respiratory: Negative for apnea, cough, choking, chest tightness, shortness of breath, wheezing and stridor.   Cardiovascular: Negative for chest pain, palpitations and leg swelling.  Gastrointestinal: Negative for abdominal pain, blood in stool, constipation, diarrhea, nausea and vomiting.  Endocrine: Negative for cold intolerance, heat intolerance, polydipsia, polyphagia and polyuria.  Genitourinary: Negative for decreased urine volume, difficulty urinating, discharge, dysuria, enuresis, flank pain, frequency, genital sores, hematuria, penile pain, penile swelling, scrotal swelling, testicular pain and urgency.  Musculoskeletal: Negative for arthralgias, back pain, gait problem, joint swelling, myalgias, neck pain and neck stiffness.  Skin: Negative for color change, pallor, rash and wound.  Allergic/Immunologic: Negative for environmental allergies, food allergies and immunocompromised state.  Neurological: Negative for dizziness, tremors, seizures, syncope, facial asymmetry, speech difficulty, weakness, light-headedness, numbness and headaches.  Hematological: Negative for adenopathy. Does not bruise/bleed easily.  Psychiatric/Behavioral: Negative for agitation, behavioral problems, confusion, decreased concentration, dysphoric mood, hallucinations, self-injury, sleep disturbance and suicidal ideas. The patient is not nervous/anxious and is not hyperactive.     Past Medical History:  Diagnosis Date  . Aortic aneurysm ([place])    followed by vascular surgery yearly.  . Bladder cancer [facility])    s/p bladder resection. [facility]  . Coronary artery disease    AMI anterior wall 1997; s/p PTCA to LAD 11/1995 [facility].  Marland Kitchen DVT (deep venous thrombosis) ([place]) 10/07/2007  . Glucose intolerance (impaired glucose tolerance)   . Hydrocele   . Hyperlipidemia   . Hypertension   . MI (myocardial infarction) ([place]) 1997  .  Nephrolithiasis   . Sleep apnea    Past Surgical History:  Procedure Laterality Date  .  [place]  . bladder removed    . CARDIAC CATHETERIZATION    . COLONOSCOPY  08/27/2011   normal.  [facility].  Marland Kitchen HYDROCELE EXCISION    . PROSTATE SURGERY     prostatectomy with bladder resection.   Allergies  Allergen Reactions  . Ceftriaxone Hives  . Sulfa Antibiotics Hives    Social History   Social History  . Marital status: Married    Spouse name: N/A  . Number of children: N/A  . Years of education: N/A   Occupational History  . retired    Social History Main Topics  . Smoking status: Never Smoker  . Smokeless tobacco: Never Used     Comment: tobacco use- no   . Alcohol use No  . Drug use: No  . Sexual activity: Yes   Other Topics Concern  . Not on file   Social History Narrative   Marital status:  Married x 28 years; happily married; third marriage.      Children: none; 2 stepdaughters; 3 grandchildren.  3 gg.      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      Tobacco:  None      Alcohol:  None; socially      Drugs: none     Exercise: Regularly exercise/walking; walking daily.  No formal exercise plan in 2018.      ADLs: independent with ADLs.   Drives; no assistant device.      Advanced Directives:  Yes; FULL CODE.  No prolonged measures.      Seatbelt: 100%         Family History  Problem Relation Age of Onset  . Stroke Mother   . Diabetes Mother   . Lung cancer Father   . Cancer Father        lung cancer  . Diabetes Sister   . Diabetes Brother        Objective:    BP 118/66   Pulse 67   Temp 98 F (36.7 C) (Oral)   Resp 16   Ht 5\' 11"  (1.803 m)   Wt 282 lb 3.2 oz (128 kg)   SpO2 95%   BMI 39.36 kg/m  Physical Exam  Constitutional: He is oriented to person, place, and time. He appears well-developed and well-nourished. No distress.  HENT:  Head: Normocephalic and atraumatic.  Right Ear:  External ear normal.  Left Ear: External ear normal.  Nose: Nose normal.  Mouth/Throat: Oropharynx is clear and moist.  Eyes: Conjunctivae and EOM are normal. Pupils are equal, round, and reactive to light.  Neck: Normal range of motion. Neck supple. Carotid bruit is not present. No thyromegaly present.  Cardiovascular: Normal rate, regular rhythm, normal heart sounds and intact distal pulses.  Exam reveals no gallop and no friction rub.   No murmur heard. Pulmonary/Chest: Effort normal and breath sounds normal. He has no wheezes. He has no rales.  Abdominal: Soft. Bowel sounds are normal. He exhibits no distension and no mass. There is no tenderness. There is no rebound and no guarding.  Urostomy bag RLQ.  Musculoskeletal:       Right shoulder: Normal.       Left shoulder: Normal.       Cervical back: Normal.  Lymphadenopathy:    He has no cervical adenopathy.  Neurological: He is alert and oriented to person, place, and time. He has normal reflexes. No cranial nerve deficit.  He exhibits normal muscle tone. Coordination normal.  Skin: Skin is warm and dry. No rash noted. He is not diaphoretic.  Chronic hyperpigmentation of skin throughout.  Psychiatric: He has a normal mood and affect. His behavior is normal. Judgment and thought content normal.    Depression screen [facility] 2/9 12/25/2016 12/12/2016 07/31/2016 04/16/2016 10/17/2015  Decreased Interest 0 0 0 0 0  Down, Depressed, Hopeless 0 1 0 0 0  PHQ - 2 Score 0 1 0 0 0   Fall Risk  12/25/2016 04/16/2016 10/17/2015 03/01/2015 08/24/2014  Falls in the past year? No No No No No   Functional Status Survey: Is the patient deaf or have difficulty hearing?: No Does the patient have difficulty seeing, even when wearing glasses/contacts?: No (Recently got new glasses) Does the patient have difficulty concentrating, remembering, or making decisions?: No Does the patient have difficulty walking or climbing stairs?: No Does the patient have difficulty  dressing or bathing?: No Does the patient have difficulty doing errands alone such as visiting a doctor's office or shopping?: No     Assessment & Plan:   1. Encounter for Medicare annual wellness exam   2. Routine physical examination   3. Abdominal aortic aneurysm (AAA) without rupture ([place])   4. Aortic atherosclerosis ([place])   5. Malignant neoplasm of anterior wall of urinary bladder ([facility])   6. Mixed hyperlipidemia   7. Other abnormal glucose   8. Atherosclerosis of native coronary artery without angina pectoris, unspecified whether native or transplanted heart   9. Essential hypertension    -anticipatory guidance provided. -obtain labs for chronic disease management and for age appropriate screening. -refills provided.   Orders Placed This Encounter  Procedures  . CBC with Differential/Platelet  . Comprehensive metabolic panel    Order Specific Question:   Has the patient fasted?    Answer:   Yes  . Hemoglobin A1c  . Lipid panel    Order Specific Question:   Has the patient fasted?    Answer:   Yes  . TSH   Meds ordered this encounter  Medications  . atorvastatin (LIPITOR) 10 MG tablet    Sig: Take 1 tablet (10 mg total) by mouth daily at 6 PM.    Dispense:  90 tablet    Refill:  3  . clopidogrel (PLAVIX) 75 MG tablet    Sig: Take 1 tablet (75 mg total) by mouth daily.    Dispense:  90 tablet    Refill:  3  . ramipril (ALTACE) 10 MG capsule    Sig: Take 1 capsule (10 mg total) by mouth daily.    Dispense:  90 capsule    Refill:  3    Return in about 6 months (around 06/27/2017) for recheck high blood pressure, high cholesterole, glucose intolerance.   Serigne Kubicek Elayne Guerin, M.D. Primary Care at [facility] previously [facility] [address] [place], [place]  [phone] phone [phone] fax

## 2016-12-25 NOTE — Patient Instructions (Addendum)
   IF you received an x-ray today, you will receive an invoice from [facility]. Please contact  Radiology at [phone] with questions or concerns regarding your invoice.   IF you received labwork today, you will receive an invoice from [facility]. Please contact [facility] at [phone] with questions or concerns regarding your invoice.   Our billing staff will not be able to assist you with questions regarding bills from these companies.  You will be contacted with the lab results as soon as they are available. The fastest way to get your results is to activate your My Chart account. Instructions are located on the last page of this paperwork. If you have not heard from us regarding the results in 2 weeks, please contact this office.      Preventive Care 65 Years and Older, Male Preventive care refers to lifestyle choices and visits with your health care provider that can promote health and wellness. What does preventive care include?  A yearly physical exam. This is also called an annual well check.  Dental exams once or twice a year.  Routine eye exams. Ask your health care provider how often you should have your eyes checked.  Personal lifestyle choices, including: ? Daily care of your teeth and gums. ? Regular physical activity. ? Eating a healthy diet. ? Avoiding tobacco and drug use. ? Limiting alcohol use. ? Practicing safe sex. ? Taking low doses of aspirin every day. ? Taking vitamin and mineral supplements as recommended by your health care provider. What happens during an annual well check? The services and screenings done by your health care provider during your annual well check will depend on your age, overall health, lifestyle risk factors, and family history of disease. Counseling Your health care provider may ask you questions about your:  Alcohol use.  Tobacco use.  Drug use.  Emotional well-being.  Home and relationship  well-being.  Sexual activity.  Eating habits.  History of falls.  Memory and ability to understand (cognition).  Work and work environment.  Screening You may have the following tests or measurements:  Height, weight, and BMI.  Blood pressure.  Lipid and cholesterol levels. These may be checked every 5 years, or more frequently if you are over 50 years old.  Skin check.  Lung cancer screening. You may have this screening every year starting at age 55 if you have a 30-pack-year history of smoking and currently smoke or have quit within the past 15 years.  Fecal occult blood test (FOBT) of the stool. You may have this test every year starting at age 50.  Flexible sigmoidoscopy or colonoscopy. You may have a sigmoidoscopy every 5 years or a colonoscopy every 10 years starting at age 50.  Prostate cancer screening. Recommendations will vary depending on your family history and other risks.  Hepatitis C blood test.  Hepatitis B blood test.  Sexually transmitted disease (STD) testing.  Diabetes screening. This is done by checking your blood sugar (glucose) after you have not eaten for a while (fasting). You may have this done every 1-3 years.  Abdominal aortic aneurysm (AAA) screening. You may need this if you are a current or former smoker.  Osteoporosis. You may be screened starting at age 70 if you are at high risk.  Talk with your health care provider about your test results, treatment options, and if necessary, the need for more tests. Vaccines Your health care provider may recommend certain vaccines, such as:  Influenza vaccine. This   is recommended every year.  Tetanus, diphtheria, and acellular pertussis (Tdap, Td) vaccine. You may need a Td booster every 10 years.  Varicella vaccine. You may need this if you have not been vaccinated.  Zoster vaccine. You may need this after age 60.  Measles, mumps, and rubella (MMR) vaccine. You may need at least one dose of  MMR if you were born in 1957 or later. You may also need a second dose.  Pneumococcal 13-valent conjugate (PCV13) vaccine. One dose is recommended after age 74.  Pneumococcal polysaccharide (PPSV23) vaccine. One dose is recommended after age 74.  Meningococcal vaccine. You may need this if you have certain conditions.  Hepatitis A vaccine. You may need this if you have certain conditions or if you travel or work in places where you may be exposed to hepatitis A.  Hepatitis B vaccine. You may need this if you have certain conditions or if you travel or work in places where you may be exposed to hepatitis B.  Haemophilus influenzae type b (Hib) vaccine. You may need this if you have certain risk factors.  Talk to your health care provider about which screenings and vaccines you need and how often you need them. This information is not intended to replace advice given to you by your health care provider. Make sure you discuss any questions you have with your health care provider. Document Released: 09/08/2015 Document Revised: 05/01/2016 Document Reviewed: 06/13/2015 Elsevier Interactive Patient Education  2017 [employer]  

## 2016-12-26 LAB — COMPREHENSIVE METABOLIC PANEL
A/G RATIO: 1.3 (ref 1.2–2.2)
ALBUMIN: 4.3 g/dL (ref 3.5–4.8)
ALK PHOS: 56 IU/L (ref 39–117)
ALT: 38 IU/L (ref 0–44)
AST: 36 IU/L (ref 0–40)
BILIRUBIN TOTAL: 0.5 mg/dL (ref 0.0–1.2)
BUN / CREAT RATIO: 14 (ref 10–24)
BUN: 11 mg/dL (ref 8–27)
CHLORIDE: 98 mmol/L (ref 96–106)
CO2: 25 mmol/L (ref 18–29)
Calcium: 9.2 mg/dL (ref 8.6–10.2)
Creatinine, Ser: 0.79 mg/dL (ref 0.76–1.27)
GFR calc Af Amer: 103 mL/min/{1.73_m2} (ref >59)
GFR calc non Af Amer: 89 mL/min/{1.73_m2} (ref >59)
GLUCOSE: 96 mg/dL (ref 65–99)
Globulin, Total: 3.4 g/dL (ref 1.5–4.5)
Potassium: 4.3 mmol/L (ref 3.5–5.2)
SODIUM: 138 mmol/L (ref 134–144)
TOTAL PROTEIN: 7.7 g/dL (ref 6.0–8.5)

## 2016-12-26 LAB — CBC WITH DIFFERENTIAL/PLATELET
BASOS ABS: 0 10*3/uL (ref 0.0–0.2)
BASOS: 0 %
EOS (ABSOLUTE): 0.3 10*3/uL (ref 0.0–0.4)
Eos: 4 %
HEMATOCRIT: 41 % (ref 37.5–51.0)
HEMOGLOBIN: 14 g/dL (ref 13.0–17.7)
IMMATURE GRANS (ABS): 0 10*3/uL (ref 0.0–0.1)
Immature Granulocytes: 0 %
LYMPHS ABS: 2.9 10*3/uL (ref 0.7–3.1)
LYMPHS: 36 %
MCH: 33.5 pg — AB (ref 26.6–33.0)
MCHC: 34.1 g/dL (ref 31.5–35.7)
MCV: 98 fL — ABNORMAL HIGH (ref 79–97)
MONOCYTES: 8 %
Monocytes Absolute: 0.7 10*3/uL (ref 0.1–0.9)
NEUTROS ABS: 4.1 10*3/uL (ref 1.4–7.0)
Neutrophils: 52 %
Platelets: 265 10*3/uL (ref 150–379)
RBC: 4.18 x10E6/uL (ref 4.14–5.80)
RDW: 14.8 % (ref 12.3–15.4)
WBC: 8 10*3/uL (ref 3.4–10.8)

## 2016-12-26 LAB — TSH: TSH: 1.6 u[IU]/mL (ref 0.450–4.500)

## 2016-12-26 LAB — LIPID PANEL
CHOL/HDL RATIO: 3.7 ratio (ref 0.0–5.0)
Cholesterol, Total: 136 mg/dL (ref 100–199)
HDL: 37 mg/dL — ABNORMAL LOW (ref >39)
LDL CALC: 56 mg/dL (ref 0–99)
TRIGLYCERIDES: 217 mg/dL — AB (ref 0–149)
VLDL Cholesterol Cal: 43 mg/dL — ABNORMAL HIGH (ref 5–40)

## 2016-12-26 LAB — HEMOGLOBIN A1C
Est. average glucose Bld gHb Est-mCnc: 120 mg/dL
HEMOGLOBIN A1C: 5.8 % — AB (ref 4.8–5.6)

## 2017-01-17 DIAGNOSIS — R310 Gross hematuria: Secondary | ICD-10-CM | POA: Diagnosis not present

## 2017-01-17 DIAGNOSIS — Z8551 Personal history of malignant neoplasm of bladder: Secondary | ICD-10-CM | POA: Diagnosis not present

## 2017-01-22 DIAGNOSIS — N200 Calculus of kidney: Secondary | ICD-10-CM | POA: Diagnosis not present

## 2017-02-10 ENCOUNTER — Other Ambulatory Visit (INDEPENDENT_AMBULATORY_CARE_PROVIDER_SITE_OTHER): Payer: Self-pay | Admitting: Vascular Surgery

## 2017-02-10 DIAGNOSIS — I714 Abdominal aortic aneurysm, without rupture, unspecified: Secondary | ICD-10-CM

## 2017-02-11 ENCOUNTER — Encounter (INDEPENDENT_AMBULATORY_CARE_PROVIDER_SITE_OTHER): Payer: Self-pay | Admitting: Vascular Surgery

## 2017-02-11 ENCOUNTER — Ambulatory Visit (INDEPENDENT_AMBULATORY_CARE_PROVIDER_SITE_OTHER): Payer: Medicare HMO | Admitting: Vascular Surgery

## 2017-02-11 ENCOUNTER — Ambulatory Visit (INDEPENDENT_AMBULATORY_CARE_PROVIDER_SITE_OTHER): Payer: Medicare HMO

## 2017-02-11 VITALS — BP 161/80 | HR 70 | Resp 17 | Ht 70.0 in | Wt 291.0 lb

## 2017-02-11 DIAGNOSIS — I714 Abdominal aortic aneurysm, without rupture, unspecified: Secondary | ICD-10-CM

## 2017-02-11 DIAGNOSIS — C673 Malignant neoplasm of anterior wall of bladder: Secondary | ICD-10-CM | POA: Diagnosis not present

## 2017-02-11 DIAGNOSIS — E782 Mixed hyperlipidemia: Secondary | ICD-10-CM

## 2017-02-11 NOTE — Assessment & Plan Note (Signed)
lipid control important in reducing the progression of atherosclerotic disease. Continue statin therapy  

## 2017-02-11 NOTE — Progress Notes (Signed)
MRN : [id number]  Christian [name] is a 76 y.o. ([birth year]) male who presents with chief complaint of  Chief Complaint  Patient presents with  . Re-evaluation    1 year follow up AAA  .  History of Present Illness: Patient returns today in follow up of AAA. He is doing well without any specific complaints today. He has no aneurysm related symptoms. Specifically, the patient denies new back or abdominal pain, or signs of peripheral embolization. His aortic duplex today shows no change in his abdominal aortic aneurysm measuring 3.3 cm in maximal diameter.  Current Outpatient Prescriptions  Medication Sig Dispense Refill  . aspirin (ASPIR-LOW) 81 MG EC tablet Take 81 mg by mouth daily.      Marland Kitchen atorvastatin (LIPITOR) 10 MG tablet Take 1 tablet (10 mg total) by mouth daily at 6 PM. 90 tablet 3  . clopidogrel (PLAVIX) 75 MG tablet Take 1 tablet (75 mg total) by mouth daily. 90 tablet 3  . nitroGLYCERIN (NITROSTAT) 0.4 MG SL tablet Place 1 tablet (0.4 mg total) under the tongue every 5 (five) minutes as needed for chest pain. 25 tablet 6  . Omega-3 Fatty Acids (RA FISH OIL) 1000 MG CAPS Take by mouth. Take 2 tabs in morning and 2 tabs i afternoon     . ramipril (ALTACE) 10 MG capsule Take 1 capsule (10 mg total) by mouth daily. 90 capsule 3  . traMADol (ULTRAM) 50 MG tablet      No current facility-administered medications for this visit.     Past Medical History:  Diagnosis Date  . Aortic aneurysm ([place])    followed by vascular surgery yearly.  . Bladder cancer [facility])    s/p bladder resection. [place]  . Coronary artery disease    AMI anterior wall 1997; s/p PTCA to LAD 11/1995 [facility].  Marland Kitchen DVT (deep venous thrombosis) ([place]) 10/07/2007  . Glucose intolerance (impaired glucose tolerance)   . Hydrocele   . Hyperlipidemia   . Hypertension   . MI (myocardial infarction) ([place]) 1997  . Nephrolithiasis   . Sleep apnea     Past Surgical History:  Procedure Laterality Date  . [place]  . bladder removed    . CARDIAC CATHETERIZATION    . COLONOSCOPY  08/27/2011   normal.  [facility].  Marland Kitchen HYDROCELE EXCISION    . PROSTATE SURGERY     prostatectomy with bladder resection.    Social History Social History  Substance Use Topics  . Smoking status: Never Smoker  . Smokeless tobacco: Never Used     Comment: tobacco use- no   . Alcohol use No    Family History Family History  Problem Relation Age of Onset  . Stroke Mother   . Diabetes Mother   . Lung cancer Father   . Cancer Father        lung cancer  . Diabetes Sister   . Diabetes Brother     Allergies  Allergen Reactions  . Ceftriaxone Hives  . Sulfasalazine Hives  . Sulfa Antibiotics Hives     REVIEW OF SYSTEMS (Negative unless checked)  Constitutional: _0 Weight loss  _1 Fever  _2 Chills Cardiac: _3 Chest pain   _4 Chest pressure   _5 Palpitations   _6 Shortness of breath when laying flat   _7 Shortness of breath at rest   _8 Shortness of breath with exertion. Vascular:  _9 Pain in legs with walking   _10 Pain in legs at rest   _11 Pain in legs when laying  flat   _0 Claudication   _1 Pain in feet when walking  _2 Pain in feet at rest  _3 Pain in feet when laying flat   _4 History of DVT   _5 Phlebitis   _6 Swelling in legs   _7 Varicose veins   _8 Non-healing ulcers Pulmonary:   _9 Uses home oxygen   _10 Productive cough   _11 Hemoptysis   _12 Wheeze  _13 COPD   _14 Asthma Neurologic:  _15 Dizziness  _16 Blackouts   _17 Seizures   _18 History of stroke   _19 History of TIA  _20 Aphasia   _21 Temporary blindness   _22 Dysphagia   _23 Weakness or numbness in arms   _24 Weakness or numbness in legs Musculoskeletal:  _25 Arthritis   _26 Joint swelling   _27 Joint pain   _28 Low back pain Hematologic:  _29 Easy bruising  _30 Easy bleeding   _31 Hypercoagulable state   _32 Anemic   Gastrointestinal:  _33 Blood in stool   _34 Vomiting blood  _35 Gastroesophageal reflux/heartburn   _36 Abdominal pain Genitourinary:  _37 Chronic kidney disease   _38 Difficult urination   _39 Frequent urination  _40 Burning with urination   _41 Hematuria Skin:  _42 Rashes   _43 Ulcers   _44 Wounds Psychological:  _45 History of anxiety   _46  History of major depression.  Physical Examination  BP (!) 161/80 (BP Location: Left Arm)   Pulse 70   Resp 17   Ht _47  (1.778 m)   Wt 291 lb (132 kg)   BMI 41.75 kg/m  Gen:  WD/WN, NAD Head: [place]/AT, No temporalis wasting. Ear/Nose/Throat: Hearing grossly intact, nares w/o erythema or drainage, trachea midline Eyes: Conjunctiva clear. Sclera non-icteric Neck: Supple.  No JVD.  Pulmonary:  Good air movement, no use of accessory muscles.  Cardiac: RRR, normal S1, S2 Vascular:  Vessel Right Left  Radial Palpable Palpable              Aorta Not palpable N/A                   Gastrointestinal: soft, non-tender/non-distended. Urostomy in place Musculoskeletal: M/S 5/5 throughout.  No deformity or atrophy.  Neurologic: Sensation grossly intact in extremities.  Symmetrical.  Speech is fluent.  Psychiatric: Judgment intact, Mood & affect appropriate for pt's clinical situation. Dermatologic: No rashes or ulcers noted.  No cellulitis or open wounds.       Labs Recent Results (from the past 2160 hour(s))  Basic metabolic panel     Status: Abnormal   Collection Time: 12/01/16  4:03 PM  Result Value Ref Range   Sodium 132 (L) 135 - 145 mmol/L   Potassium 4.0 3.5 - 5.1 mmol/L   Chloride 97 (L) 101 - 111 mmol/L   CO2 24 22 - 32 mmol/L   Glucose, Bld 145 (H) 65 - 99 mg/dL   BUN 20 6 - 20 mg/dL   Creatinine, Ser 1.26 (H) 0.61 - 1.24 mg/dL   Calcium 8.8 (L) 8.9 - 10.3 mg/dL   GFR calc non Af Amer 55 (L) >60 mL/min   GFR calc Af Amer >60 >60 mL/min    Comment: (NOTE) The eGFR has been calculated using the CKD EPI equation. This calculation has not been validated in all clinical situations. eGFR's persistently <60 mL/min signify possible Chronic Kidney Disease.    Anion gap 11 5 - 15  CBC     Status: Abnormal   Collection Time:  12/01/16  4:03 PM  Result Value Ref Range   WBC 12.8 (H) 3.8 - 10.6 K/uL   RBC 4.52 4.40 - 5.90 MIL/uL   Hemoglobin 15.4 13.0 - 18.0 g/dL   HCT 44.4  40.0 - 52.0 %   MCV 98.1 80.0 - 100.0 fL   MCH 33.9 26.0 - 34.0 pg   MCHC 34.6 32.0 - 36.0 g/dL   RDW 14.5 11.5 - 14.5 %   Platelets 343 150 - 440 K/uL  Troponin I     Status: None   Collection Time: 12/01/16  4:03 PM  Result Value Ref Range   Troponin I <0.03 <0.03 ng/mL  Urinalysis, Complete w Microscopic     Status: Abnormal   Collection Time: 12/01/16  4:03 PM  Result Value Ref Range   Color, Urine AMBER (A) YELLOW    Comment: BIOCHEMICALS MAY BE AFFECTED BY COLOR   APPearance CLOUDY (A) CLEAR   Specific Gravity, Urine 1.016 1.005 - 1.030   pH 5.0 5.0 - 8.0   Glucose, UA NEGATIVE NEGATIVE mg/dL   Hgb urine dipstick SMALL (A) NEGATIVE   Bilirubin Urine NEGATIVE NEGATIVE   Ketones, ur NEGATIVE NEGATIVE mg/dL   Protein, ur 30 (A) NEGATIVE mg/dL   Nitrite NEGATIVE NEGATIVE   Leukocytes, UA MODERATE (A) NEGATIVE   RBC / HPF 6-30 0 - 5 RBC/hpf   WBC, UA TOO NUMEROUS TO COUNT 0 - 5 WBC/hpf   Bacteria, UA FEW (A) NONE SEEN   Squamous Epithelial / LPF 0-5 (A) NONE SEEN   Mucous PRESENT    Hyaline Casts, UA PRESENT    Non Squamous Epithelial 0-5 (A) NONE SEEN  Troponin I     Status: None   Collection Time: 12/01/16  5:49 PM  Result Value Ref Range   Troponin I <0.03 <0.03 ng/mL  Magnesium     Status: None   Collection Time: 12/01/16  5:49 PM  Result Value Ref Range   Magnesium 2.1 1.7 - 2.4 mg/dL  Troponin I     Status: None   Collection Time: 12/01/16  7:47 PM  Result Value Ref Range   Troponin I <0.03 <0.03 ng/mL  Comprehensive metabolic panel     Status: Abnormal   Collection Time: 12/12/16 10:26 AM  Result Value Ref Range   Glucose 117 (H) 65 - 99 mg/dL   BUN 10 8 - 27 mg/dL   Creatinine, Ser 0.85 0.76 - 1.27 mg/dL   GFR calc non Af Amer 86 >59 mL/min/1.73   GFR calc Af Amer 100 >59 mL/min/1.73   BUN/Creatinine  Ratio 12 10 - 24   Sodium 141 134 - 144 mmol/L   Potassium 4.2 3.5 - 5.2 mmol/L   Chloride 99 96 - 106 mmol/L   CO2 22 18 - 29 mmol/L   Calcium 9.0 8.6 - 10.2 mg/dL   Total Protein 7.2 6.0 - 8.5 g/dL   Albumin 4.3 3.5 - 4.8 g/dL   Globulin, Total 2.9 1.5 - 4.5 g/dL   Albumin/Globulin Ratio 1.5 1.2 - 2.2   Bilirubin Total 0.4 0.0 - 1.2 mg/dL   Alkaline Phosphatase 53 39 - 117 IU/L   AST 24 0 - 40 IU/L   ALT 32 0 - 44 IU/L  TSH     Status: None   Collection Time: 12/12/16 10:26 AM  Result Value Ref Range   TSH 2.180 0.450 - 4.500 uIU/mL  Urine culture     Status: None   Collection Time: 12/12/16 10:26 AM  Result Value Ref Range   Urine Culture, Routine Final report    Organism ID, Bacteria Comment     Comment: Greater than 2 organisms recovered, none predominant. Please submit another sample if clinically indicated. Greater than  100,000 colony forming units per mL   Magnesium     Status: None   Collection Time: 12/12/16 10:26 AM  Result Value Ref Range   Magnesium 2.2 1.6 - 2.3 mg/dL  POCT CBC     Status: Abnormal   Collection Time: 12/12/16 10:35 AM  Result Value Ref Range   WBC 6.9 4.6 - 10.2 K/uL   Lymph, poc 2.4 0.6 - 3.4   POC LYMPH PERCENT 34.1 10 - 50 %L   MID (cbc) 0.6 0 - 0.9   POC MID % 8.1 0 - 12 %M   POC Granulocyte 4.0 2 - 6.9   Granulocyte percent 57.8 37 - 80 %G   RBC 4.10 (A) 4.69 - 6.13 M/uL   Hemoglobin 14.2 14.1 - 18.1 g/dL   HCT, POC 40.7 (A) 43.5 - 53.7 %   MCV 99.3 (A) 80 - 97 fL   MCH, POC 34.6 (A) 27 - 31.2 pg   MCHC 34.9 31.8 - 35.4 g/dL   RDW, POC 14.5 %   Platelet Count, POC 305 142 - 424 K/uL   MPV 7.6 0 - 99.8 fL  POCT glucose (manual entry)     Status: Abnormal   Collection Time: 12/12/16 10:35 AM  Result Value Ref Range   POC Glucose 113 (A) 70 - 99 mg/dl  CBC with Differential/Platelet     Status: Abnormal   Collection Time: 12/25/16  3:10 PM  Result Value Ref Range   WBC 8.0 3.4 - 10.8 x10E3/uL   RBC 4.18 4.14 - 5.80 x10E6/uL    Hemoglobin 14.0 13.0 - 17.7 g/dL   Hematocrit 41.0 37.5 - 51.0 %   MCV 98 (H) 79 - 97 fL   MCH 33.5 (H) 26.6 - 33.0 pg   MCHC 34.1 31.5 - 35.7 g/dL   RDW 14.8 12.3 - 15.4 %   Platelets 265 150 - 379 x10E3/uL   Neutrophils 52 Not Estab. %   Lymphs 36 Not Estab. %   Monocytes 8 Not Estab. %   Eos 4 Not Estab. %   Basos 0 Not Estab. %   Neutrophils Absolute 4.1 1.4 - 7.0 x10E3/uL   Lymphocytes Absolute 2.9 0.7 - 3.1 x10E3/uL   Monocytes Absolute 0.7 0.1 - 0.9 x10E3/uL   EOS (ABSOLUTE) 0.3 0.0 - 0.4 x10E3/uL   Basophils Absolute 0.0 0.0 - 0.2 x10E3/uL   Immature Granulocytes 0 Not Estab. %   Immature Grans (Abs) 0.0 0.0 - 0.1 x10E3/uL  Comprehensive metabolic panel     Status: None   Collection Time: 12/25/16  3:10 PM  Result Value Ref Range   Glucose 96 65 - 99 mg/dL   BUN 11 8 - 27 mg/dL   Creatinine, Ser 0.79 0.76 - 1.27 mg/dL   GFR calc non Af Amer 89 >59 mL/min/1.73   GFR calc Af Amer 103 >59 mL/min/1.73   BUN/Creatinine Ratio 14 10 - 24   Sodium 138 134 - 144 mmol/L   Potassium 4.3 3.5 - 5.2 mmol/L   Chloride 98 96 - 106 mmol/L   CO2 25 18 - 29 mmol/L   Calcium 9.2 8.6 - 10.2 mg/dL   Total Protein 7.7 6.0 - 8.5 g/dL   Albumin 4.3 3.5 - 4.8 g/dL   Globulin, Total 3.4 1.5 - 4.5 g/dL   Albumin/Globulin Ratio 1.3 1.2 - 2.2   Bilirubin Total 0.5 0.0 - 1.2 mg/dL   Alkaline Phosphatase 56 39 - 117 IU/L   AST 36 0 - 40 IU/L  ALT 38 0 - 44 IU/L  Hemoglobin A1c     Status: Abnormal   Collection Time: 12/25/16  3:10 PM  Result Value Ref Range   Hgb A1c MFr Bld 5.8 (H) 4.8 - 5.6 %    Comment:          Pre-diabetes: 5.7 - 6.4          Diabetes: >6.4          Glycemic control for adults with diabetes: <7.0    Est. average glucose Bld gHb Est-mCnc 120 mg/dL  Lipid panel     Status: Abnormal   Collection Time: 12/25/16  3:10 PM  Result Value Ref Range   Cholesterol, Total 136 100 - 199 mg/dL   Triglycerides 217 (H) 0 - 149 mg/dL   HDL 37 (L) >39 mg/dL   VLDL Cholesterol Cal  43 (H) 5 - 40 mg/dL   LDL Calculated 56 0 - 99 mg/dL   Chol/HDL Ratio 3.7 0.0 - 5.0 ratio    Comment:                                   T. Chol/HDL Ratio                                             Men  Women                               1/2 Avg.Risk  3.4    3.3                                   Avg.Risk  5.0    4.4                                2X Avg.Risk  9.6    7.1                                3X Avg.Risk 23.4   11.0   TSH     Status: None   Collection Time: 12/25/16  3:10 PM  Result Value Ref Range   TSH 1.600 0.450 - 4.500 uIU/mL    Radiology No results found.    Assessment/Plan  Hyperlipidemia lipid control important in reducing the progression of atherosclerotic disease. Continue statin therapy   Bladder cancer S/p resection by Urology and doing well.  Abdominal aortic aneurysm His aortic duplex today shows no change in his abdominal aortic aneurysm measuring 3.3 cm in maximal diameter. At this point, we will continue yearly duplex follow-ups. He will contact our office with any problems in the interim.    Leotis Pain, MD  02/11/2017 9:29 AM    This note was created with Dragon medical transcription system.  Any errors from dictation are purely unintentional

## 2017-02-11 NOTE — Patient Instructions (Signed)
Abdominal Aortic Aneurysm An aneurysm is a bulge in an artery. It happens when blood pushes up against a weakened or damaged artery wall. An abdominal aortic aneurysm is an aneurysm that occurs in the lower part of the aorta, the main artery of the body. The aorta supplies blood from the heart to the rest of the body. Some aneurysms may not cause symptoms or problems. However, the major concern with an abdominal aortic aneurysm is that it can enlarge and burst (rupture), or that blood can flow between the layers of the wall of the aorta through a tear (aorticdissection). Both of these conditions can cause bleeding inside the body and can be life-threatening unless diagnosed and treated right away. What are the causes? The exact cause of this condition is not known. What increases the risk? The following factors may make you more likely to develop this condition:  Being older than age 60.  Having a hardening of the arteries caused by the buildup of fat and other substances in the lining of a blood vessel (arteriosclerosis).  Having inflammation of the walls of an artery (arteritis).  Having a genetic disease that weakens the body's connective tissue, such as Marfan syndrome.  Having abdominal trauma.  Having an infection, such as syphilis or staphylococcus, in the wall of the aorta (infectious aortitis) caused by bacteria.  Having high blood pressure (hypertension).  Being male.  Being white (Caucasian).  Having high cholesterol.  Having a family history of aneurysms.  Using tobacco.  Having chronic obstructive pulmonary disease (COPD).  What are the signs or symptoms? Symptoms of this condition vary depending on the size and rate of growth of the aneurysm.Most aneurysms grow slowly and do not cause any symptoms. When symptoms do occur, they may include:  Pain in the abdomen, side, or lower back. The pain may vary in intensity.  Feeling full after eating only small amounts of  food.  Feeling a pulsating lump in the abdomen.  Symptoms that the aneurysm has ruptured include:  A sudden onset of severe pain in the abdomen, side, or lower back.  Nausea or vomiting.  Feeling faint or passing out.  How is this diagnosed? This condition may be diagnosed with:  A physical exam. During the exam, your health care provider will check for throbbing in your abdomen. He or she may also listen to the blood flow in your abdomen.  Tests, such as: ? An ultrasound. ? X-rays. ? A CT scan. ? An MRI. ? Tests to check your arteries for damage or blockage (angiogram).  Because most unruptured abdominal aortic aneurysms cause no symptoms, they are often found during exams for other conditions. How is this treated? Treatment for this condition depends on:  The size of the aneurysm.  How fast the aneurysm is growing.  Your age.  Risk factors for rupture.  Aneurysms that are smaller than 2 inches (5 cm) may be managed by using medicines to control blood pressure, manage pain, or fight infection. You may need regular monitoring to see if the aneurysm is getting bigger. Your health care provider may recommend that you have an ultrasound every few years, every year, or every 3-6 months. How often you need to have an ultrasound depends on the size of the aneurysm, how fast it is growing, and whether you have a family history of aneurysms. Surgical repair may be needed if your aneurysm is larger than 2 inches (5 cm). Follow these instructions at home: General instructions  Keep all   follow-up visits as told by your health care provider. This is important. ? Talk to your health care provider about regular screenings to see if the aneurysm is getting bigger.  Take over-the-counter and prescription medicines only as told by your health care provider.  Avoid heavy lifting and activities that take a lot of effort (are strenuous). Ask your health care provider what activities are  safe for you. Lifestyle Follow instructions from your health care provider about healthy lifestyle habits. Your health care provider may recommend:  Not using any products that contain nicotine or tobacco, such as cigarettes and e-cigarettes. If you need help quitting, ask your health care provider.  Limiting or avoiding alcohol.  Keeping your blood pressure within normal limits. The target limit for most people is below 120/80. Check your blood pressure regularly. If it is high, ask your health care provider about ways that you can control it.  Keeping your blood sugar level and cholesterol levels within normal limits. Target limits for most people are: ? Blood sugar level: Less than 100 mg/dL. ? Total cholesterol level: Less than 200 mg/dL.  Eating a healthy diet. This may include: ? Lowering your salt (sodium) intake. In some people, too much salt can raise blood pressure and increase the risk of abdominal aortic aneurysm. ? Avoiding foods that are high in saturated fat and cholesterol, such as red meat and dairy products. ? Eating a diet that is low in sugar. ? Increasing your fiber intake by including whole grains, vegetables, and fruits in your diet. Eating these foods may help to lower blood pressure.  Maintaining a healthy weight.  Staying physically active and exercising regularly. Talk with your health care provider about how often you should exercise and which types of exercise are safe for you.  Contact a health care provider if:  You have pain in your abdomen, side, or lower back.  You have a throbbing feeling in your abdomen.  You have a family history of aneurysms. Get help right away if:  You have sudden, severe pain in your abdomen or lower back.  You experience nausea or vomiting.  You have constipation or problems urinating.  You feel light-headed.  You have a rapid heart rate when you stand.  You have sweaty, clammy skin.  You have shortness of  breath.  You have a fever. This information is not intended to replace advice given to you by your health care provider. Make sure you discuss any questions you have with your health care provider. Document Released: 05/22/2005 Document Revised: 03/06/2016 Document Reviewed: 01/30/2016 Elsevier Interactive Patient Education  2018 [employer]  

## 2017-02-11 NOTE — Assessment & Plan Note (Signed)
His aortic duplex today shows no change in his abdominal aortic aneurysm measuring 3.3 cm in maximal diameter. At this point, we will continue yearly duplex follow-ups. He will contact our office with any problems in the interim.

## 2017-02-11 NOTE — Assessment & Plan Note (Signed)
S/p resection by Urology and doing well.

## 2017-03-12 DIAGNOSIS — G4733 Obstructive sleep apnea (adult) (pediatric): Secondary | ICD-10-CM | POA: Diagnosis not present

## 2017-04-02 ENCOUNTER — Encounter: Payer: Self-pay | Admitting: Family Medicine

## 2017-04-02 ENCOUNTER — Ambulatory Visit (INDEPENDENT_AMBULATORY_CARE_PROVIDER_SITE_OTHER): Payer: Medicare HMO | Admitting: Family Medicine

## 2017-04-02 VITALS — BP 138/81 | HR 64 | Temp 98.0°F | Resp 18 | Ht 69.29 in | Wt 293.0 lb

## 2017-04-02 DIAGNOSIS — I2510 Atherosclerotic heart disease of native coronary artery without angina pectoris: Secondary | ICD-10-CM

## 2017-04-02 DIAGNOSIS — Z8551 Personal history of malignant neoplasm of bladder: Secondary | ICD-10-CM

## 2017-04-02 DIAGNOSIS — I700 Atherosclerosis of aorta: Secondary | ICD-10-CM

## 2017-04-02 DIAGNOSIS — I714 Abdominal aortic aneurysm, without rupture, unspecified: Secondary | ICD-10-CM

## 2017-04-02 DIAGNOSIS — E7800 Pure hypercholesterolemia, unspecified: Secondary | ICD-10-CM | POA: Diagnosis not present

## 2017-04-02 DIAGNOSIS — R7309 Other abnormal glucose: Secondary | ICD-10-CM

## 2017-04-02 DIAGNOSIS — E785 Hyperlipidemia, unspecified: Secondary | ICD-10-CM | POA: Diagnosis not present

## 2017-04-02 DIAGNOSIS — I10 Essential (primary) hypertension: Secondary | ICD-10-CM | POA: Diagnosis not present

## 2017-04-02 NOTE — Patient Instructions (Signed)
     IF you received an x-ray today, you will receive an invoice from [facility]. Please contact [facility] at [phone] with questions or concerns regarding your invoice.   IF you received labwork today, you will receive an invoice from [facility]. Please contact [facility] at [phone] with questions or concerns regarding your invoice.   Our billing staff will not be able to assist you with questions regarding bills from these companies.  You will be contacted with the lab results as soon as they are available. The fastest way to get your results is to activate your My Chart account. Instructions are located on the last page of this paperwork. If you have not heard from us regarding the results in 2 weeks, please contact this office.     

## 2017-04-15 DIAGNOSIS — Z936 Other artificial openings of urinary tract status: Secondary | ICD-10-CM | POA: Diagnosis not present

## 2017-04-15 DIAGNOSIS — R310 Gross hematuria: Secondary | ICD-10-CM | POA: Diagnosis not present

## 2017-04-15 DIAGNOSIS — N200 Calculus of kidney: Secondary | ICD-10-CM | POA: Diagnosis not present

## 2017-04-15 DIAGNOSIS — Z8551 Personal history of malignant neoplasm of bladder: Secondary | ICD-10-CM | POA: Diagnosis not present

## 2017-04-16 NOTE — Progress Notes (Signed)
Subjective:    Patient ID: Christian [name], male    DOB: 1941-08-01, 76 y.o.   MRN: [id number]  04/02/2017  Hypertension (3 month follow-up) and Hyperlipidemia   HPI This 76 y.o. male presents for evaluation of hypertension, hypercholesterolemia, glucose intolerance, CAD, AAA, history of bladder cancer.  Patient reports good compliance with medication, good tolerance to medication, and good symptom control.     BP Readings from Last 3 Encounters:  04/02/17 138/81  02/11/17 (!) 161/80  12/25/16 118/66   Wt Readings from Last 3 Encounters:  04/02/17 293 lb (132.9 kg)  02/11/17 291 lb (132 kg)  12/25/16 282 lb 3.2 oz (128 kg)   Immunization History  Administered Date(s) Administered  . Influenza,inj,Quad PF,6+ Mos 06/02/2013, 10/17/2015, 04/16/2016  . Pneumococcal Conjugate-13 07/31/2016    Review of Systems  Constitutional: Negative for activity change, appetite change, chills, diaphoresis, fatigue and fever.  Respiratory: Negative for cough and shortness of breath.   Cardiovascular: Negative for chest pain, palpitations and leg swelling.  Gastrointestinal: Negative for abdominal pain, diarrhea, nausea and vomiting.  Endocrine: Negative for cold intolerance, heat intolerance, polydipsia, polyphagia and polyuria.  Skin: Negative for color change, rash and wound.  Neurological: Negative for dizziness, tremors, seizures, syncope, facial asymmetry, speech difficulty, weakness, light-headedness, numbness and headaches.  Psychiatric/Behavioral: Negative for dysphoric mood and sleep disturbance. The patient is not nervous/anxious.     Past Medical History:  Diagnosis Date  . Aortic aneurysm ([place])    followed by vascular surgery yearly.  . Bladder cancer [facility])    s/p bladder resection. [place]  . Coronary artery disease    AMI anterior wall 1997; s/p PTCA to LAD 11/1995 [facility].  Marland Kitchen DVT (deep venous thrombosis) ([place]) 10/07/2007  . Glucose intolerance (impaired glucose tolerance)   .  Hydrocele   . Hyperlipidemia   . Hypertension   . MI (myocardial infarction) ([place]) 1997  . Nephrolithiasis   . Sleep apnea    Past Surgical History:  Procedure Laterality Date  . [place]  . bladder removed    . CARDIAC CATHETERIZATION    . COLONOSCOPY  08/27/2011   normal.  [facility].  Marland Kitchen HYDROCELE EXCISION    . PROSTATE SURGERY     prostatectomy with bladder resection.   Allergies  Allergen Reactions  . Ceftriaxone Hives  . Sulfasalazine Hives  . Sulfa Antibiotics Hives    Social History   Social History  . Marital status: Married    Spouse name: N/A  . Number of children: N/A  . Years of education: N/A   Occupational History  . retired    Social History Main Topics  . Smoking status: Never Smoker  . Smokeless tobacco: Never Used     Comment: tobacco use- no   . Alcohol use No  . Drug use: No  . Sexual activity: Yes   Other Topics Concern  . Not on file   Social History Narrative   Marital status:  Married x 28 years; happily married; third marriage.      Children: none; 2 stepdaughters; 3 grandchildren.  3 gg.      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      Tobacco:  None      Alcohol:  None; socially      Drugs: none     Exercise: Regularly exercise/walking; walking daily.  No formal exercise plan in 2018.      ADLs: independent  with ADLs.   Drives; no assistant device.      Advanced Directives:  Yes; FULL CODE.  No prolonged measures.      Seatbelt: 100%         Family History  Problem Relation Age of Onset  . Stroke Mother   . Diabetes Mother   . Lung cancer Father   . Cancer Father        lung cancer  . Diabetes Sister   . Diabetes Brother        Objective:    BP 138/81   Pulse 64   Temp 98 F (36.7 C) (Oral)   Resp 18   Ht 5' 9.29" (1.76 m)   Wt 293 lb (132.9 kg)   SpO2 95%   BMI 42.91 kg/m  Physical Exam  Constitutional: He is oriented to person, place, and time. He  appears well-developed and well-nourished. No distress.  HENT:  Head: Normocephalic and atraumatic.  Right Ear: External ear normal.  Left Ear: External ear normal.  Nose: Nose normal.  Mouth/Throat: Oropharynx is clear and moist.  Eyes: Pupils are equal, round, and reactive to light. Conjunctivae and EOM are normal.  Neck: Normal range of motion. Neck supple. Carotid bruit is not present. No thyromegaly present.  Cardiovascular: Normal rate, regular rhythm, normal heart sounds and intact distal pulses.  Exam reveals no gallop and no friction rub.   No murmur heard. Pulmonary/Chest: Effort normal and breath sounds normal. He has no wheezes. He has no rales.  Abdominal: Soft. Bowel sounds are normal. He exhibits no distension and no mass. There is no tenderness. There is no rebound and no guarding.  Musculoskeletal:       Right shoulder: Normal.       Left shoulder: Normal.       Cervical back: Normal.  Lymphadenopathy:    He has no cervical adenopathy.  Neurological: He is alert and oriented to person, place, and time. He has normal reflexes. No cranial nerve deficit. He exhibits normal muscle tone. Coordination normal.  Skin: Skin is warm and dry. No rash noted. He is not diaphoretic.  Psychiatric: He has a normal mood and affect. His behavior is normal. Judgment and thought content normal.    No results found. Depression screen [facility] 2/9 04/02/2017 12/25/2016 12/12/2016 07/31/2016 04/16/2016  Decreased Interest 0 0 0 0 0  Down, Depressed, Hopeless 0 0 1 0 0  PHQ - 2 Score 0 0 1 0 0   Fall Risk  04/02/2017 12/25/2016 04/16/2016 10/17/2015 03/01/2015  Falls in the past year? No No No No No        Assessment & Plan:   1. Aortic atherosclerosis (HCC)   2. Abdominal aortic aneurysm (AAA) without rupture ([place])   3. Atherosclerosis of native coronary artery of native heart without angina pectoris   4. History of bladder cancer   5. Pure hypercholesterolemia   6. Other abnormal glucose   7.  OBESITY-MORBID (>100')    -controlled chronic medical conditions.  Continue current medications.   -s/p follow-up with Dr. Lucky Cowboy of vascular surgery; s/p follow-=up with Dr. Jacqlyn Larsen of Urology.   -recommend weight loss, exercise for 30-60 minutes five days per week; recommend 1200 kcal restriction per day with a minimum of 60 grams of protein per day. - I recommend weight loss, exercise, and low-carbohydrate low-sugar food choices. You should AVOID: regular sodas, sweetened tea, fruit juices.  You should LIMIT: breads, pastas, rice, potatoes, and desserts/sweets.  I would  recommend limiting your total carbohydrate intake per meal to 45 grams; I would limit your total carbohydrate intake per snack to 30 grams.  I would also have a goal of 60 grams of protein intake per day; this would equal 10-15 grams of protein per meal and 5-10 grams of protein per snack.    No orders of the defined types were placed in this encounter.  No orders of the defined types were placed in this encounter.   No Follow-up on file.   Dru Laurel Elayne Guerin, M.D. Primary Care at [facility] [address] [place], [place]  [address] [phone] phone [phone] fax

## 2017-04-24 ENCOUNTER — Telehealth: Payer: Self-pay | Admitting: Family Medicine

## 2017-04-24 NOTE — Telephone Encounter (Signed)
Pt states he can not see his labs on MyChart and would like someone to give him a call to go over them.  Please adv,

## 2017-05-02 NOTE — Telephone Encounter (Signed)
Please advise. No notes on lab results.

## 2017-05-05 NOTE — Telephone Encounter (Signed)
I have located the labs for that day. They have been placed in your box.

## 2017-05-05 NOTE — Telephone Encounter (Signed)
Left message I have lab results from 04/02/17 visit.

## 2017-05-05 NOTE — Telephone Encounter (Signed)
The patient was seen on 04/02/17 when [facility]/EPIC was down.  We ordered all labs on paper.  There is no documentation in EPIC for any labs on patient. Can you check with [facility] to see if labs were obtained on patient.  (I could not order labs in EPIC that day; they would have been ordered on [employer] paper requisition form.

## 2017-06-05 DIAGNOSIS — Z86718 Personal history of other venous thrombosis and embolism: Secondary | ICD-10-CM | POA: Insufficient documentation

## 2017-06-05 DIAGNOSIS — I82509 Chronic embolism and thrombosis of unspecified deep veins of unspecified lower extremity: Secondary | ICD-10-CM | POA: Insufficient documentation
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
## 2017-06-06 ENCOUNTER — Encounter: Payer: Self-pay | Admitting: Cardiovascular Disease

## 2017-06-06 ENCOUNTER — Ambulatory Visit (INDEPENDENT_AMBULATORY_CARE_PROVIDER_SITE_OTHER): Payer: Medicare HMO | Admitting: Cardiovascular Disease

## 2017-06-06 VITALS — BP 152/86 | HR 68 | Ht 71.0 in | Wt 291.5 lb

## 2017-06-06 DIAGNOSIS — E7800 Pure hypercholesterolemia, unspecified: Secondary | ICD-10-CM

## 2017-06-06 DIAGNOSIS — I82599 Chronic embolism and thrombosis of other specified deep vein of unspecified lower extremity: Secondary | ICD-10-CM

## 2017-06-06 DIAGNOSIS — I700 Atherosclerosis of aorta: Secondary | ICD-10-CM | POA: Diagnosis not present

## 2017-06-06 DIAGNOSIS — I25118 Atherosclerotic heart disease of native coronary artery with other forms of angina pectoris: Secondary | ICD-10-CM | POA: Diagnosis not present

## 2017-06-06 NOTE — Patient Instructions (Signed)
Medication Instructions:   No medication changes made  Monitor your blood pressure  Labwork:  No new labs needed  Testing/Procedures:  No further testing at this time   Follow-Up: It was a pleasure seeing you in the office today. Please call us if you have new issues that need to be addressed before your next appt.  [phone]  Your physician wants you to follow-up in: 12 months.  You will receive a reminder letter in the mail two months in advance. If you don't receive a letter, please call our office to schedule the follow-up appointment.  If you need a refill on your cardiac medications before your next appointment, please call your pharmacy.

## 2017-06-12 DIAGNOSIS — G4733 Obstructive sleep apnea (adult) (pediatric): Secondary | ICD-10-CM | POA: Diagnosis not present

## 2017-07-07 ENCOUNTER — Ambulatory Visit: Payer: Medicare HMO | Admitting: Family Medicine

## 2017-07-07 ENCOUNTER — Encounter: Payer: Self-pay | Admitting: Family Medicine

## 2017-07-07 VITALS — BP 128/72 | HR 82 | Temp 98.0°F | Resp 16 | Ht 70.08 in | Wt 290.0 lb

## 2017-07-07 DIAGNOSIS — I25118 Atherosclerotic heart disease of native coronary artery with other forms of angina pectoris: Secondary | ICD-10-CM | POA: Diagnosis not present

## 2017-07-07 DIAGNOSIS — I714 Abdominal aortic aneurysm, without rupture, unspecified: Secondary | ICD-10-CM

## 2017-07-07 DIAGNOSIS — Z23 Encounter for immunization: Secondary | ICD-10-CM

## 2017-07-07 DIAGNOSIS — R7309 Other abnormal glucose: Secondary | ICD-10-CM | POA: Diagnosis not present

## 2017-07-07 DIAGNOSIS — Z8551 Personal history of malignant neoplasm of bladder: Secondary | ICD-10-CM

## 2017-07-07 DIAGNOSIS — J0100 Acute maxillary sinusitis, unspecified: Secondary | ICD-10-CM

## 2017-07-07 DIAGNOSIS — E7800 Pure hypercholesterolemia, unspecified: Secondary | ICD-10-CM | POA: Diagnosis not present

## 2017-07-07 MED ORDER — DOXYCYCLINE HYCLATE 100 MG PO CAPS: 100.0000 mg | ORAL_CAPSULE | Freq: Two times a day (BID) | ORAL | 0 refills | Status: DC

## 2017-07-07 MED ORDER — ATORVASTATIN CALCIUM 10 MG PO TABS: 10.0000 mg | ORAL_TABLET | Freq: Every day | ORAL | 3 refills | Status: DC

## 2017-07-07 NOTE — Progress Notes (Signed)
Subjective:    Patient ID: Christian [name], male    DOB: Sep 22, 1940, 76 y.o.   MRN: [id number]  07/07/2017  Hypertension (3 month follow-up) and Hyperlipidemia    HPI This 76 y.o. male presents for three month follow-up of hypertension, hypercholesterolemia, glucose intolerance, and CAD.  No changes to management made at last visit.  Feeling great!  Spent six weeks at [facility] at [employer].  Met some really nice people.  Just patient and wife.  Two children came down for one week.  Interested in high dose flu vaccine.     Three weeks of congestion, head congestion, chest congestion.  Wheezing.  Doing better.  Three week duration.  Ready for influenza.    Diagnosed 2007 with bladder cancer; two years later; had to take chemo and radiation before surgery. Sent to Dr. Jonette Eva in [facility].     Follow-up with Dr. Ida Rogue of cardiology in October 2018.  No changes to medical management made at that visit.  Dr. Earma Reading recommended weight loss, regular aerobic exercise.  Emphasized goal of LDL cholesterol less than 70.  Follow-up with Dr. Edrick Oh of urology in August 2018 for gross hematuria and a history of bladder cancer status post bladder resection.  Patient also having problems with urinary output during that visit.  Monitoring was recommended at that visit.  1 year follow-up recommended with routine renal ultrasound and chest x-ray due to history of bladder neoplasm.  BP Readings from Last 3 Encounters:  07/07/17 128/72  06/06/17 (!) 152/86  04/02/17 138/81   Wt Readings from Last 3 Encounters:  07/07/17 290 lb (131.5 kg)  06/06/17 291 lb 8 oz (132.2 kg)  04/02/17 293 lb (132.9 kg)   Immunization History  Administered Date(s) Administered  . Influenza,inj,Quad PF,6+ Mos 06/02/2013, 10/17/2015, 04/16/2016  . Pneumococcal Conjugate-13 07/31/2016  . Pneumococcal Polysaccharide-23 03/09/2008    Review of Systems  Constitutional: Negative for activity change,  appetite change, chills, diaphoresis, fatigue, fever and unexpected weight change.  HENT: Positive for congestion, rhinorrhea and sneezing. Negative for dental problem, drooling, ear discharge, ear pain, facial swelling, hearing loss, mouth sores, nosebleeds, postnasal drip, sinus pressure, sore throat, tinnitus, trouble swallowing and voice change.   Eyes: Negative for photophobia, pain, discharge, redness, itching and visual disturbance.  Respiratory: Positive for cough and wheezing. Negative for apnea, choking, chest tightness, shortness of breath and stridor.   Cardiovascular: Negative for chest pain, palpitations and leg swelling.  Gastrointestinal: Negative for abdominal pain, blood in stool, constipation, diarrhea, nausea and vomiting.  Endocrine: Negative for cold intolerance, heat intolerance, polydipsia, polyphagia and polyuria.  Genitourinary: Negative for decreased urine volume, difficulty urinating, discharge, dysuria, enuresis, flank pain, frequency, genital sores, hematuria, penile pain, penile swelling, scrotal swelling, testicular pain and urgency.  Musculoskeletal: Negative for arthralgias, back pain, gait problem, joint swelling, myalgias, neck pain and neck stiffness.  Skin: Negative for color change, pallor, rash and wound.  Allergic/Immunologic: Negative for environmental allergies, food allergies and immunocompromised state.  Neurological: Negative for dizziness, tremors, seizures, syncope, facial asymmetry, speech difficulty, weakness, light-headedness, numbness and headaches.  Hematological: Negative for adenopathy. Does not bruise/bleed easily.  Psychiatric/Behavioral: Negative for agitation, behavioral problems, confusion, decreased concentration, dysphoric mood, hallucinations, self-injury, sleep disturbance and suicidal ideas. The patient is not nervous/anxious and is not hyperactive.     Past Medical History:  Diagnosis Date  . Aortic aneurysm ([place])    followed by  vascular surgery yearly.  . Bladder cancer ([place])  s/p bladder resection. [facility]  . Coronary artery disease    AMI anterior wall 1997; s/p PTCA to LAD 11/1995 [facility].  Marland Kitchen DVT (deep venous thrombosis) ([place]) 10/07/2007  . Glucose intolerance (impaired glucose tolerance)   . Hydrocele   . Hyperlipidemia   . Hypertension   . MI (myocardial infarction) ([place]) 1997  . Nephrolithiasis   . Sleep apnea    Past Surgical History:  Procedure Laterality Date  . [place]  . bladder removed    . CARDIAC CATHETERIZATION    . COLONOSCOPY  08/27/2011   normal.  [facility].  Marland Kitchen HYDROCELE EXCISION    . PROSTATE SURGERY     prostatectomy with bladder resection.   Allergies  Allergen Reactions  . Ceftriaxone Hives  . Sulfasalazine Hives  . Sulfa Antibiotics Hives   Current Outpatient Medications on File Prior to Visit  Medication Sig Dispense Refill  . aspirin (ASPIR-LOW) 81 MG EC tablet Take 81 mg by mouth daily.      . clopidogrel (PLAVIX) 75 MG tablet Take 1 tablet (75 mg total) by mouth daily. 90 tablet 3  . nitroGLYCERIN (NITROSTAT) 0.4 MG SL tablet Place 1 tablet (0.4 mg total) under the tongue every 5 (five) minutes as needed for chest pain. 25 tablet 6  . Omega-3 Fatty Acids (RA FISH OIL) 1000 MG CAPS Take by mouth. Take 2 tabs in morning and 2 tabs i afternoon     . ramipril (ALTACE) 10 MG capsule Take 1 capsule (10 mg total) by mouth daily. 90 capsule 3   No current facility-administered medications on file prior to visit.    Social History   Socioeconomic History  . Marital status: Married    Spouse name: Not on file  . Number of children: Not on file  . Years of education: Not on file  . Highest education level: Not on file  Social Needs  . Financial resource strain: Not on file  . Food insecurity - worry: Not on file  . Food insecurity - inability: Not on file  . Transportation needs - medical: Not on file  . Transportation needs - non-medical: Not  on file  Occupational History  . Occupation: retired  Tobacco Use  . Smoking status: Never Smoker  . Smokeless tobacco: Never Used  . Tobacco comment: tobacco use- no   Substance and Sexual Activity  . Alcohol use: No  . Drug use: No  . Sexual activity: Yes  Other Topics Concern  . Not on file  Social History Narrative   Marital status:  Married x 28 years; happily married; third marriage.      Children: none; 2 stepdaughters; 3 grandchildren.  3 gg.      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      Tobacco:  None      Alcohol:  None; socially      Drugs: none     Exercise: Regularly exercise/walking; walking daily.  No formal exercise plan in 2018.      ADLs: independent with ADLs.   Drives; no assistant device.      Advanced Directives:  Yes; FULL CODE.  No prolonged measures.      Seatbelt: 100%         Family History  Problem Relation Age of Onset  . Stroke Mother   . Diabetes Mother   . Lung cancer Father   . Cancer Father  lung cancer  . Diabetes Sister   . Diabetes Brother        Objective:    BP 128/72   Pulse 82   Temp 98 F (36.7 C) (Oral)   Resp 16   Ht 5' 10.08" (1.78 m)   Wt 290 lb (131.5 kg)   SpO2 94%   BMI 41.52 kg/m  Physical Exam  Constitutional: He is oriented to person, place, and time. He appears well-developed and well-nourished. No distress.  HENT:  Head: Normocephalic and atraumatic.  Right Ear: External ear normal.  Left Ear: External ear normal.  Nose: Nose normal.  Mouth/Throat: Oropharynx is clear and moist.  Eyes: Conjunctivae and EOM are normal. Pupils are equal, round, and reactive to light.  Neck: Normal range of motion. Neck supple. Carotid bruit is not present. No thyromegaly present.  Cardiovascular: Normal rate, regular rhythm, normal heart sounds and intact distal pulses. Exam reveals no gallop and no friction rub.  No murmur heard. Pulmonary/Chest: Effort normal and breath sounds normal. He  has no wheezes. He has no rales.  Abdominal: Soft. Bowel sounds are normal. He exhibits no distension and no mass. There is no tenderness. There is no rebound and no guarding.  Ostomy site healthy along RLQ.  Musculoskeletal:       Right shoulder: Normal.       Left shoulder: Normal.       Cervical back: Normal.  Lymphadenopathy:    He has no cervical adenopathy.  Neurological: He is alert and oriented to person, place, and time. He has normal reflexes. No cranial nerve deficit. He exhibits normal muscle tone. Coordination normal.  Skin: Skin is warm and dry. No rash noted. He is not diaphoretic.  Psychiatric: He has a normal mood and affect. His behavior is normal. Judgment and thought content normal.   No results found. Depression screen [facility] 2/9 07/07/2017 04/02/2017 12/25/2016 12/12/2016 07/31/2016  Decreased Interest 0 0 0 0 0  Down, Depressed, Hopeless 0 0 0 1 0  PHQ - 2 Score 0 0 0 1 0   Fall Risk  07/07/2017 04/02/2017 12/25/2016 04/16/2016 10/17/2015  Falls in the past year? _0         Assessment & Plan:   1. Pure hypercholesterolemia   2. Other abnormal glucose   3. History of bladder cancer   4. Atherosclerosis of native coronary artery of native heart with stable angina pectoris ([place])   5. Abdominal aortic aneurysm (AAA) without rupture ([place])   6. Acute non-recurrent maxillary sinusitis   7. Need for prophylactic vaccination and inoculation against influenza    New onset acute maxillary sinusitis.  Recurrent issue for patient every fall.  Prescription for doxycycline provided.  Well-controlled hypercholesterolemia and hypertension.  History of coronary artery disease and followed annually by Dr. Rockey Situ of cardiology.  Asymptomatic today.  Refill of atorvastatin provided.  Goal LDL less than 70 per current guidelines.  Obtain chronic labs for disease management.  History of bladder cancer followed annually by Dr. Edrick Oh of urology.  Status post bladder resection  with ostomy.  Recent gross hematuria and underwent workup by Dr. Jacqlyn Larsen.  No recurrence of hematuria in the past several months.  Orders Placed This Encounter  Procedures  . Flu Vaccine QUAD 36+ mos IM  . CBC with Differential/Platelet  . Comprehensive metabolic panel    Order Specific Question:   Has the patient fasted?    Answer:   No  . Lipid panel  Order Specific Question:   Has the patient fasted?    Answer:   No   Meds ordered this encounter  Medications  . doxycycline (VIBRAMYCIN) 100 MG capsule    Sig: Take 1 capsule (100 mg total) 2 (two) times daily by mouth.    Dispense:  20 capsule    Refill:  0  . atorvastatin (LIPITOR) 10 MG tablet    Sig: Take 1 tablet (10 mg total) daily at 6 PM by mouth.    Dispense:  90 tablet    Refill:  3    Return in about 4 months (around 11/04/2017) for follow-up chronic medical conditions.   Legend Tumminello Elayne Guerin, M.D. Primary Care at [facility] [address] [place], [place]  [address] [phone] phone [phone] fax

## 2017-07-07 NOTE — Patient Instructions (Addendum)
   IF you received an x-ray today, you will receive an invoice from [facility]. Please contact [facility] at [phone] with questions or concerns regarding your invoice.   IF you received labwork today, you will receive an invoice from [facility]. Please contact [facility] at [phone] with questions or concerns regarding your invoice.   Our billing staff will not be able to assist you with questions regarding bills from these companies.  You will be contacted with the lab results as soon as they are available. The fastest way to get your results is to activate your My Chart account. Instructions are located on the last page of this paperwork. If you have not heard from us regarding the results in 2 weeks, please contact this office.    Influenza (Flu) Vaccine (Inactivated or Recombinant): What You Need to Know 1. Why get vaccinated? Influenza ("flu") is a contagious disease that spreads around the [place] every year, usually between October and May. Flu is caused by influenza viruses, and is spread mainly by coughing, sneezing, and close contact. Anyone can get flu. Flu strikes suddenly and can last several days. Symptoms vary by age, but can include:  fever/chills  sore throat  muscle aches  fatigue  cough  headache  runny or stuffy nose  Flu can also lead to pneumonia and blood infections, and cause diarrhea and seizures in children. If you have a medical condition, such as heart or lung disease, flu can make it worse. Flu is more dangerous for some people. Infants and young children, people 65 years of age and older, pregnant women, and people with certain health conditions or a weakened immune system are at greatest risk. Each year thousands of people in the [place] die from flu, and many more are hospitalized. Flu vaccine can:  keep you from getting flu,  make flu less severe if you do get it, and  keep you from spreading flu to your  family and other people. 2. Inactivated and recombinant flu vaccines A dose of flu vaccine is recommended every flu season. Children 6 months through 8 years of age may need two doses during the same flu season. Everyone else needs only one dose each flu season. Some inactivated flu vaccines contain a very small amount of a mercury-based preservative called thimerosal. Studies have not shown thimerosal in vaccines to be harmful, but flu vaccines that do not contain thimerosal are available. There is no live flu virus in flu shots. They cannot cause the flu. There are many flu viruses, and they are always changing. Each year a new flu vaccine is made to protect against three or four viruses that are likely to cause disease in the upcoming flu season. But even when the vaccine doesn't exactly match these viruses, it may still provide some protection. Flu vaccine cannot prevent:  flu that is caused by a virus not covered by the vaccine, or  illnesses that look like flu but are not.  It takes about 2 weeks for protection to develop after vaccination, and protection lasts through the flu season. 3. Some people should not get this vaccine Tell the person who is giving you the vaccine:  If you have any severe, life-threatening allergies. If you ever had a life-threatening allergic reaction after a dose of flu vaccine, or have a severe allergy to any part of this vaccine, you may be advised not to get vaccinated. Most, but not all, types of flu vaccine contain a small amount   of egg protein.  If you ever had Guillain-Barr Syndrome (also called GBS). Some people with a history of GBS should not get this vaccine. This should be discussed with your doctor.  If you are not feeling well. It is usually okay to get flu vaccine when you have a mild illness, but you might be asked to come back when you feel better.  4. Risks of a vaccine reaction With any medicine, including vaccines, there is a chance of  reactions. These are usually mild and go away on their own, but serious reactions are also possible. Most people who get a flu shot do not have any problems with it. Minor problems following a flu shot include:  soreness, redness, or swelling where the shot was given  hoarseness  sore, red or itchy eyes  cough  fever  aches  headache  itching  fatigue  If these problems occur, they usually begin soon after the shot and last 1 or 2 days. More serious problems following a flu shot can include the following:  There may be a small increased risk of Guillain-Barre Syndrome (GBS) after inactivated flu vaccine. This risk has been estimated at 1 or 2 additional cases per million people vaccinated. This is much lower than the risk of severe complications from flu, which can be prevented by flu vaccine.  Young children who get the flu shot along with pneumococcal vaccine (PCV13) and/or DTaP vaccine at the same time might be slightly more likely to have a seizure caused by fever. Ask your doctor for more information. Tell your doctor if a child who is getting flu vaccine has ever had a seizure.  Problems that could happen after any injected vaccine:  People sometimes faint after a medical procedure, including vaccination. Sitting or lying down for about 15 minutes can help prevent fainting, and injuries caused by a fall. Tell your doctor if you feel dizzy, or have vision changes or ringing in the ears.  Some people get severe pain in the shoulder and have difficulty moving the arm where a shot was given. This happens very rarely.  Any medication can cause a severe allergic reaction. Such reactions from a vaccine are very rare, estimated at about 1 in a million doses, and would happen within a few minutes to a few hours after the vaccination. As with any medicine, there is a very remote chance of a vaccine causing a serious injury or death. The safety of vaccines is always being monitored.  For more information, visit: www.cdc.gov/vaccinesafety/ 5. What if there is a serious reaction? What should I look for? Look for anything that concerns you, such as signs of a severe allergic reaction, very high fever, or unusual behavior. Signs of a severe allergic reaction can include hives, swelling of the face and throat, difficulty breathing, a fast heartbeat, dizziness, and weakness. These would start a few minutes to a few hours after the vaccination. What should I do?  If you think it is a severe allergic reaction or other emergency that can't wait, call 9-1-1 and get the person to the nearest hospital. Otherwise, call your doctor.  Reactions should be reported to the Vaccine Adverse Event Reporting System (VAERS). Your doctor should file this report, or you can do it yourself through the VAERS web site at www.vaers.hhs.gov, or by calling 1-800-822-7967. ? VAERS does not give medical advice. 6. The National Vaccine Injury Compensation Program The National Vaccine Injury Compensation Program (VICP) is a federal program that was created   to compensate people who may have been injured by certain vaccines. Persons who believe they may have been injured by a vaccine can learn about the program and about filing a claim by calling 1-800-338-2382 or visiting the VICP website at www.hrsa.gov/vaccinecompensation. There is a time limit to file a claim for compensation. 7. How can I learn more?  Ask your healthcare provider. He or she can give you the vaccine package insert or suggest other sources of information.  Call your local or state health department.  Contact the Centers for Disease Control and Prevention (CDC): ? Call 1-800-232-4636 (1-800-CDC-INFO) or ? Visit CDC's website at www.cdc.gov/flu Vaccine Information Statement, Inactivated Influenza Vaccine (04/01/2014) This information is not intended to replace advice given to you by your health care provider. Make sure you discuss any  questions you have with your health care provider. Document Released: 06/06/2006 Document Revised: 05/02/2016 Document Reviewed: 05/02/2016 Elsevier Interactive Patient Education  2017 [employer]  

## 2017-07-08 ENCOUNTER — Ambulatory Visit: Payer: Medicare HMO | Admitting: Family Medicine

## 2017-07-08 LAB — CBC WITH DIFFERENTIAL/PLATELET
BASOS: 0 %
Basophils Absolute: 0 10*3/uL (ref 0.0–0.2)
EOS (ABSOLUTE): 0.2 10*3/uL (ref 0.0–0.4)
EOS: 2 %
HEMATOCRIT: 42.6 % (ref 37.5–51.0)
Hemoglobin: 14.3 g/dL (ref 13.0–17.7)
Immature Grans (Abs): 0 10*3/uL (ref 0.0–0.1)
Immature Granulocytes: 0 %
Lymphocytes Absolute: 2.3 10*3/uL (ref 0.7–3.1)
Lymphs: 26 %
MCH: 33.1 pg — AB (ref 26.6–33.0)
MCHC: 33.6 g/dL (ref 31.5–35.7)
MCV: 99 fL — AB (ref 79–97)
MONOS ABS: 0.8 10*3/uL (ref 0.1–0.9)
Monocytes: 8 %
NEUTROS ABS: 5.7 10*3/uL (ref 1.4–7.0)
Neutrophils: 64 %
Platelets: 296 10*3/uL (ref 150–379)
RBC: 4.32 x10E6/uL (ref 4.14–5.80)
RDW: 14.6 % (ref 12.3–15.4)
WBC: 9 10*3/uL (ref 3.4–10.8)

## 2017-07-08 LAB — COMPREHENSIVE METABOLIC PANEL
A/G RATIO: 1.6 (ref 1.2–2.2)
ALT: 49 IU/L — ABNORMAL HIGH (ref 0–44)
AST: 46 IU/L — ABNORMAL HIGH (ref 0–40)
Albumin: 4.4 g/dL (ref 3.5–4.8)
Alkaline Phosphatase: 58 IU/L (ref 39–117)
BILIRUBIN TOTAL: 0.4 mg/dL (ref 0.0–1.2)
BUN/Creatinine Ratio: 13 (ref 10–24)
BUN: 10 mg/dL (ref 8–27)
CALCIUM: 9.2 mg/dL (ref 8.6–10.2)
CO2: 23 mmol/L (ref 20–29)
Chloride: 98 mmol/L (ref 96–106)
Creatinine, Ser: 0.78 mg/dL (ref 0.76–1.27)
GFR, EST AFRICAN AMERICAN: 103 mL/min/{1.73_m2} (ref >59)
GFR, EST NON AFRICAN AMERICAN: 89 mL/min/{1.73_m2} (ref >59)
GLOBULIN, TOTAL: 2.8 g/dL (ref 1.5–4.5)
Glucose: 97 mg/dL (ref 65–99)
POTASSIUM: 4.3 mmol/L (ref 3.5–5.2)
SODIUM: 138 mmol/L (ref 134–144)
TOTAL PROTEIN: 7.2 g/dL (ref 6.0–8.5)

## 2017-07-08 LAB — LIPID PANEL
CHOL/HDL RATIO: 3.4 ratio (ref 0.0–5.0)
Cholesterol, Total: 124 mg/dL (ref 100–199)
HDL: 36 mg/dL — ABNORMAL LOW (ref >39)
LDL Calculated: 47 mg/dL (ref 0–99)
Triglycerides: 203 mg/dL — ABNORMAL HIGH (ref 0–149)
VLDL Cholesterol Cal: 41 mg/dL — ABNORMAL HIGH (ref 5–40)

## 2017-07-15 DIAGNOSIS — Z936 Other artificial openings of urinary tract status: Secondary | ICD-10-CM | POA: Diagnosis not present

## 2017-09-22 DIAGNOSIS — G4733 Obstructive sleep apnea (adult) (pediatric): Secondary | ICD-10-CM | POA: Diagnosis not present

## 2017-10-02 DIAGNOSIS — Z936 Other artificial openings of urinary tract status: Secondary | ICD-10-CM | POA: Diagnosis not present

## 2017-11-03 ENCOUNTER — Encounter: Payer: Self-pay | Admitting: Family Medicine

## 2017-11-03 ENCOUNTER — Other Ambulatory Visit: Payer: Self-pay

## 2017-11-03 ENCOUNTER — Ambulatory Visit: Payer: Medicare HMO | Admitting: Family Medicine

## 2017-11-03 VITALS — BP 138/70 | HR 86 | Temp 98.5°F | Resp 16 | Ht 69.69 in | Wt 293.4 lb

## 2017-11-03 DIAGNOSIS — I10 Essential (primary) hypertension: Secondary | ICD-10-CM

## 2017-11-03 DIAGNOSIS — I25118 Atherosclerotic heart disease of native coronary artery with other forms of angina pectoris: Secondary | ICD-10-CM | POA: Diagnosis not present

## 2017-11-03 DIAGNOSIS — E7800 Pure hypercholesterolemia, unspecified: Secondary | ICD-10-CM | POA: Diagnosis not present

## 2017-11-03 DIAGNOSIS — I714 Abdominal aortic aneurysm, without rupture, unspecified: Secondary | ICD-10-CM

## 2017-11-03 DIAGNOSIS — Z8551 Personal history of malignant neoplasm of bladder: Secondary | ICD-10-CM | POA: Diagnosis not present

## 2017-11-03 DIAGNOSIS — R7309 Other abnormal glucose: Secondary | ICD-10-CM

## 2017-11-03 DIAGNOSIS — I82599 Chronic embolism and thrombosis of other specified deep vein of unspecified lower extremity: Secondary | ICD-10-CM | POA: Diagnosis not present

## 2017-11-03 MED ORDER — CLOPIDOGREL BISULFATE 75 MG PO TABS: 75.0000 mg | ORAL_TABLET | Freq: Every day | ORAL | 3 refills | Status: DC

## 2017-11-03 MED ORDER — ATORVASTATIN CALCIUM 10 MG PO TABS: 10.0000 mg | ORAL_TABLET | Freq: Every day | ORAL | 3 refills | Status: DC

## 2017-11-03 MED ORDER — RAMIPRIL 10 MG PO CAPS: 10.0000 mg | ORAL_CAPSULE | Freq: Every day | ORAL | 3 refills | Status: DC

## 2017-11-03 NOTE — Progress Notes (Signed)
Subjective:    Patient ID: Christian [name], male    DOB: 12-Apr-1941, 77 y.o.   MRN: [id number]  11/03/2017  Chronic Conditions; Hypertension; and Hyperlipidemia    HPI This 77 y.o. male presents for four month follow-up for hypertension, hypercholesterolemia, hyperglycemia, CAD, history of bladder cancer.  No changes to management made at last visit; treated for sinusitis with Doxycycline; LFTs elevated.  Doing well since last visit.  Patient reports good compliance with medication, good tolerance to medication, and good symptom control.      BP Readings from Last 3 Encounters:  11/20/17 (!) 135/58  11/09/17 (!) 109/57  11/03/17 138/70   Wt Readings from Last 3 Encounters:  11/17/17 285 lb (129.3 kg)  11/09/17 285 lb (129.3 kg)  11/03/17 293 lb 6.4 oz (133.1 kg)   Immunization History  Administered Date(s) Administered  . Influenza,inj,Quad PF,6+ Mos 06/02/2013, 10/17/2015, 04/16/2016, 07/07/2017  . Pneumococcal Conjugate-13 07/31/2016  . Pneumococcal Polysaccharide-23 03/09/2008    Review of Systems  Constitutional: Negative for activity change, appetite change, chills, diaphoresis, fatigue and fever.  Respiratory: Negative for cough and shortness of breath.   Cardiovascular: Negative for chest pain, palpitations and leg swelling.  Gastrointestinal: Negative for abdominal pain, diarrhea, nausea and vomiting.  Endocrine: Negative for cold intolerance, heat intolerance, polydipsia, polyphagia and polyuria.  Skin: Negative for color change, rash and wound.  Neurological: Negative for dizziness, tremors, seizures, syncope, facial asymmetry, speech difficulty, weakness, light-headedness, numbness and headaches.  Psychiatric/Behavioral: Negative for dysphoric mood and sleep disturbance. The patient is not nervous/anxious.     Past Medical History:  Diagnosis Date  . Aortic aneurysm ([place])    followed by vascular surgery yearly.  . Bladder cancer [facility])    s/p bladder  resection. [facility]  . Coronary artery disease    AMI anterior wall 1997; s/p PTCA to LAD 11/1995 [facility].  Marland Kitchen DVT (deep venous thrombosis) ([place]) 10/07/2007  . Glucose intolerance (impaired glucose tolerance)   . Hydrocele   . Hyperlipidemia   . Hypertension   . MI (myocardial infarction) ([place]) 1997  . Nephrolithiasis   . Sleep apnea    Past Surgical History:  Procedure Laterality Date  . [place]  . bladder removed    . CARDIAC CATHETERIZATION    . COLONOSCOPY  08/27/2011   normal.  [facility].  Marland Kitchen HYDROCELE EXCISION    . PROSTATE SURGERY     prostatectomy with bladder resection.   Allergies  Allergen Reactions  . Ceftriaxone Hives  . Sulfasalazine Hives  . Sulfa Antibiotics Hives   Current Outpatient Medications on File Prior to Visit  Medication Sig Dispense Refill  . aspirin (ASPIR-LOW) 81 MG EC tablet Take 81 mg by mouth daily.      . nitroGLYCERIN (NITROSTAT) 0.4 MG SL tablet Place 1 tablet (0.4 mg total) under the tongue every 5 (five) minutes as needed for chest pain. 25 tablet 6  . Omega-3 Fatty Acids (RA FISH OIL) 1000 MG CAPS Take 1,000 mg by mouth 2 (two) times daily.      No current facility-administered medications on file prior to visit.    Social History   Socioeconomic History  . Marital status: Married    Spouse name: Not on file  . Number of children: Not on file  . Years of education: Not on file  . Highest education level: Not on file  Occupational History  . Occupation: retired  Scientific laboratory technician  . Financial resource strain:  Patient refused  . Food insecurity:    Worry: Patient refused    Inability: Patient refused  . Transportation needs:    Medical: Patient refused    Non-medical: Patient refused  Tobacco Use  . Smoking status: Never Smoker  . Smokeless tobacco: Never Used  . Tobacco comment: tobacco use- no   Substance and Sexual Activity  . Alcohol use: No  . Drug use: No  . Sexual activity: Yes  Lifestyle  .  Physical activity:    Days per week: Patient refused    Minutes per session: Patient refused  . Stress: Patient refused  Relationships  . Social connections:    Talks on phone: Patient refused    Gets together: Patient refused    Attends religious service: Patient refused    Active member of club or organization: Patient refused    Attends meetings of clubs or organizations: Patient refused    Relationship status: Patient refused  . Intimate partner violence:    Fear of current or ex partner: Patient refused    Emotionally abused: Patient refused    Physically abused: Patient refused    Forced sexual activity: Patient refused  Other Topics Concern  . Not on file  Social History Narrative   Marital status:  Married x 28 years; happily married; third marriage.      Children: none; 2 stepdaughters; 3 grandchildren.  3 gg.      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      Tobacco:  None      Alcohol:  None; socially      Drugs: none     Exercise: Regularly exercise/walking; walking daily.  No formal exercise plan in 2018.      ADLs: independent with ADLs.   Drives; no assistant device.      Advanced Directives:  Yes; FULL CODE.  No prolonged measures.      Seatbelt: 100%         Family History  Problem Relation Age of Onset  . Stroke Mother   . Diabetes Mother   . Lung cancer Father   . Cancer Father        lung cancer  . Diabetes Sister   . Diabetes Brother        Objective:    BP 138/70   Pulse 86   Temp 98.5 F (36.9 C) (Oral)   Resp 16   Ht 5' 9.69" (1.77 m)   Wt 293 lb 6.4 oz (133.1 kg)   SpO2 96%   BMI 42.48 kg/m  Physical Exam  Constitutional: He is oriented to person, place, and time. He appears well-developed and well-nourished. No distress.  HENT:  Head: Normocephalic and atraumatic.  Right Ear: External ear normal.  Left Ear: External ear normal.  Nose: Nose normal.  Mouth/Throat: Oropharynx is clear and moist.  Eyes:  Conjunctivae and EOM are normal. Pupils are equal, round, and reactive to light.  Neck: Normal range of motion. Neck supple. Carotid bruit is not present. No thyromegaly present.  Cardiovascular: Normal rate, regular rhythm, normal heart sounds and intact distal pulses. Exam reveals no gallop and no friction rub.  No murmur heard. Pulmonary/Chest: Effort normal and breath sounds normal. He has no wheezes. He has no rales.  Abdominal: Soft. Bowel sounds are normal. He exhibits no distension and no mass. There is no tenderness. There is no rebound and no guarding.  Lymphadenopathy:    He has no cervical adenopathy.  Neurological: He is alert and oriented to person, place, and time. No cranial nerve deficit.  Skin: Skin is warm and dry. No rash noted. He is not diaphoretic.  Psychiatric: He has a normal mood and affect. His behavior is normal.  Nursing note and vitals reviewed.  No results found. Depression screen [facility] 2/9 11/03/2017 07/07/2017 04/02/2017 12/25/2016 12/12/2016  Decreased Interest 0 0 0 0 0  Down, Depressed, Hopeless 0 0 0 0 1  PHQ - 2 Score 0 0 0 0 1   Fall Risk  11/03/2017 07/07/2017 04/02/2017 12/25/2016 04/16/2016  Falls in the past year? No No No No No        Assessment & Plan:   1. Atherosclerosis of native coronary artery of native heart with stable angina pectoris ([place])   2. Pure hypercholesterolemia   3. OBESITY-MORBID (>100')   4. Other abnormal glucose   5. Abdominal aortic aneurysm (AAA) without rupture ([place])   6. History of bladder cancer   7. Essential hypertension     Stable and controlled chronic medical conditions; obtain labs; no changes to management.   Recommend weight loss, exercise for 30-60 minutes five days per week; recommend 1800 kcal restriction per day with a minimum of 60 grams of protein per day. Continue regular follow-up with cardiology and urology and vascular surgery.    Orders Placed This Encounter  Procedures  . CBC with  Differential/Platelet  . Comprehensive metabolic panel    Order Specific Question:   Has the patient fasted?    Answer:   No  . Lipid panel    Order Specific Question:   Has the patient fasted?    Answer:   No   Meds ordered this encounter  Medications  . atorvastatin (LIPITOR) 10 MG tablet    Sig: Take 1 tablet (10 mg total) by mouth daily at 6 PM.    Dispense:  90 tablet    Refill:  3  . ramipril (ALTACE) 10 MG capsule    Sig: Take 1 capsule (10 mg total) by mouth daily.    Dispense:  90 capsule    Refill:  3  . clopidogrel (PLAVIX) 75 MG tablet    Sig: Take 1 tablet (75 mg total) by mouth daily.    Dispense:  90 tablet    Refill:  3    Return in about 3 months (around 02/03/2018) for complete physical examiniation.   Najae Rathert Elayne Guerin, M.D. Primary Care at [facility] previously [facility] [address] [place], [place]  [address] [phone] phone [phone] fax

## 2017-11-03 NOTE — Patient Instructions (Signed)
     IF you received an x-ray today, you will receive an invoice from [facility]. Please contact [facility] at [phone] with questions or concerns regarding your invoice.   IF you received labwork today, you will receive an invoice from [facility]. Please contact [facility] at [phone] with questions or concerns regarding your invoice.   Our billing staff will not be able to assist you with questions regarding bills from these companies.  You will be contacted with the lab results as soon as they are available. The fastest way to get your results is to activate your My Chart account. Instructions are located on the last page of this paperwork. If you have not heard from us regarding the results in 2 weeks, please contact this office.     

## 2017-11-04 LAB — CBC WITH DIFFERENTIAL/PLATELET
Basophils Absolute: 0 10*3/uL (ref 0.0–0.2)
Basos: 0 %
EOS (ABSOLUTE): 0.1 10*3/uL (ref 0.0–0.4)
EOS: 1 %
HEMATOCRIT: 44.3 % (ref 37.5–51.0)
Hemoglobin: 14.8 g/dL (ref 13.0–17.7)
Immature Grans (Abs): 0 10*3/uL (ref 0.0–0.1)
Immature Granulocytes: 0 %
Lymphocytes Absolute: 2.2 10*3/uL (ref 0.7–3.1)
Lymphs: 24 %
MCH: 33.9 pg — ABNORMAL HIGH (ref 26.6–33.0)
MCHC: 33.4 g/dL (ref 31.5–35.7)
MCV: 101 fL — AB (ref 79–97)
MONOS ABS: 0.9 10*3/uL (ref 0.1–0.9)
Monocytes: 10 %
NEUTROS ABS: 5.8 10*3/uL (ref 1.4–7.0)
Neutrophils: 65 %
Platelets: 321 10*3/uL (ref 150–379)
RBC: 4.37 x10E6/uL (ref 4.14–5.80)
RDW: 14.5 % (ref 12.3–15.4)
WBC: 9 10*3/uL (ref 3.4–10.8)

## 2017-11-04 LAB — LIPID PANEL
CHOL/HDL RATIO: 3.9 ratio (ref 0.0–5.0)
CHOLESTEROL TOTAL: 128 mg/dL (ref 100–199)
HDL: 33 mg/dL — ABNORMAL LOW (ref >39)
LDL Calculated: 58 mg/dL (ref 0–99)
TRIGLYCERIDES: 184 mg/dL — AB (ref 0–149)
VLDL Cholesterol Cal: 37 mg/dL (ref 5–40)

## 2017-11-04 LAB — COMPREHENSIVE METABOLIC PANEL
A/G RATIO: 1.4 (ref 1.2–2.2)
ALK PHOS: 60 IU/L (ref 39–117)
ALT: 75 IU/L — ABNORMAL HIGH (ref 0–44)
AST: 82 IU/L — AB (ref 0–40)
Albumin: 4.5 g/dL (ref 3.5–4.8)
BUN/Creatinine Ratio: 14 (ref 10–24)
BUN: 13 mg/dL (ref 8–27)
Bilirubin Total: 0.5 mg/dL (ref 0.0–1.2)
CO2: 22 mmol/L (ref 20–29)
Calcium: 8.9 mg/dL (ref 8.6–10.2)
Chloride: 100 mmol/L (ref 96–106)
Creatinine, Ser: 0.93 mg/dL (ref 0.76–1.27)
GFR calc Af Amer: 93 mL/min/{1.73_m2} (ref >59)
GFR, EST NON AFRICAN AMERICAN: 81 mL/min/{1.73_m2} (ref >59)
GLOBULIN, TOTAL: 3.2 g/dL (ref 1.5–4.5)
Glucose: 111 mg/dL — ABNORMAL HIGH (ref 65–99)
POTASSIUM: 4.6 mmol/L (ref 3.5–5.2)
SODIUM: 137 mmol/L (ref 134–144)
Total Protein: 7.7 g/dL (ref 6.0–8.5)

## 2017-11-09 ENCOUNTER — Encounter: Payer: Self-pay | Admitting: Emergency Medicine

## 2017-11-09 ENCOUNTER — Other Ambulatory Visit: Payer: Self-pay

## 2017-11-09 ENCOUNTER — Emergency Department: Payer: Medicare HMO

## 2017-11-09 ENCOUNTER — Emergency Department
Admission: EM | Admit: 2017-11-09 | Discharge: 2017-11-09 | Disposition: A | Discharge status: Home / Self Care | Payer: Medicare HMO | Attending: Emergency Medicine | Admitting: Emergency Medicine

## 2017-11-09 DIAGNOSIS — N390 Urinary tract infection, site not specified: Secondary | ICD-10-CM | POA: Diagnosis not present

## 2017-11-09 DIAGNOSIS — J111 Influenza due to unidentified influenza virus with other respiratory manifestations: Secondary | ICD-10-CM | POA: Diagnosis not present

## 2017-11-09 DIAGNOSIS — I2510 Atherosclerotic heart disease of native coronary artery without angina pectoris: Secondary | ICD-10-CM | POA: Diagnosis not present

## 2017-11-09 DIAGNOSIS — R05 Cough: Secondary | ICD-10-CM | POA: Diagnosis not present

## 2017-11-09 DIAGNOSIS — J101 Influenza due to other identified influenza virus with other respiratory manifestations: Secondary | ICD-10-CM | POA: Diagnosis not present

## 2017-11-09 DIAGNOSIS — I10 Essential (primary) hypertension: Secondary | ICD-10-CM | POA: Diagnosis not present

## 2017-11-09 DIAGNOSIS — R1110 Vomiting, unspecified: Secondary | ICD-10-CM | POA: Diagnosis not present

## 2017-11-09 LAB — URINALYSIS, COMPLETE (UACMP) WITH MICROSCOPIC
BILIRUBIN URINE: NEGATIVE
Glucose, UA: NEGATIVE mg/dL
Ketones, ur: NEGATIVE mg/dL
NITRITE: POSITIVE — AB
PH: 5 (ref 5.0–8.0)
Protein, ur: 100 mg/dL — AB
SPECIFIC GRAVITY, URINE: 1.017 (ref 1.005–1.030)

## 2017-11-09 LAB — COMPREHENSIVE METABOLIC PANEL
ALT: 126 U/L — ABNORMAL HIGH (ref 17–63)
ANION GAP: 12 (ref 5–15)
AST: 196 U/L — ABNORMAL HIGH (ref 15–41)
Albumin: 3.9 g/dL (ref 3.5–5.0)
Alkaline Phosphatase: 50 U/L (ref 38–126)
BUN: 16 mg/dL (ref 6–20)
CHLORIDE: 95 mmol/L — AB (ref 101–111)
CO2: 24 mmol/L (ref 22–32)
Calcium: 8.8 mg/dL — ABNORMAL LOW (ref 8.9–10.3)
Creatinine, Ser: 1.11 mg/dL (ref 0.61–1.24)
Glucose, Bld: 150 mg/dL — ABNORMAL HIGH (ref 65–99)
POTASSIUM: 3.8 mmol/L (ref 3.5–5.1)
Sodium: 131 mmol/L — ABNORMAL LOW (ref 135–145)
TOTAL PROTEIN: 8.1 g/dL (ref 6.5–8.1)
Total Bilirubin: 0.8 mg/dL (ref 0.3–1.2)

## 2017-11-09 LAB — CBC WITH DIFFERENTIAL/PLATELET
BASOS ABS: 0 10*3/uL (ref 0–0.1)
Basophils Relative: 1 %
EOS PCT: 0 %
Eosinophils Absolute: 0 10*3/uL (ref 0–0.7)
HCT: 40.6 % (ref 40.0–52.0)
Hemoglobin: 13.9 g/dL (ref 13.0–18.0)
LYMPHS ABS: 0.5 10*3/uL — AB (ref 1.0–3.6)
LYMPHS PCT: 8 %
MCH: 33.9 pg (ref 26.0–34.0)
MCHC: 34.1 g/dL (ref 32.0–36.0)
MCV: 99.3 fL (ref 80.0–100.0)
MONO ABS: 1.1 10*3/uL — AB (ref 0.2–1.0)
MONOS PCT: 16 %
Neutro Abs: 5 10*3/uL (ref 1.4–6.5)
Neutrophils Relative %: 75 %
Platelets: 215 10*3/uL (ref 150–440)
RBC: 4.09 MIL/uL — ABNORMAL LOW (ref 4.40–5.90)
RDW: 14.6 % — AB (ref 11.5–14.5)
WBC: 6.6 10*3/uL (ref 3.8–10.6)

## 2017-11-09 LAB — INFLUENZA PANEL BY PCR (TYPE A & B)
Influenza A By PCR: POSITIVE — AB
Influenza B By PCR: NEGATIVE

## 2017-11-09 LAB — TROPONIN I: Troponin I: <0.03 ng/mL (ref <0.03)

## 2017-11-09 MED ORDER — CIPROFLOXACIN IN D5W 400 MG/200ML IV SOLN
400.0000 mg | Freq: Once | INTRAVENOUS | Status: AC
  Administered 2017-11-09: 400 mg via INTRAVENOUS
  Filled 2017-11-09: qty 200

## 2017-11-09 MED ORDER — KETOROLAC TROMETHAMINE 30 MG/ML IJ SOLN
15.0000 mg | Freq: Once | INTRAMUSCULAR | Status: AC
  Administered 2017-11-09: 15 mg via INTRAVENOUS
  Filled 2017-11-09: qty 1

## 2017-11-09 MED ORDER — ONDANSETRON HCL 4 MG/2ML IJ SOLN
4.0000 mg | Freq: Once | INTRAMUSCULAR | Status: AC
  Administered 2017-11-09: 4 mg via INTRAVENOUS
  Filled 2017-11-09: qty 2

## 2017-11-09 MED ORDER — CIPROFLOXACIN HCL 500 MG PO TABS: 500.0000 mg | ORAL_TABLET | Freq: Two times a day (BID) | ORAL | 0 refills | Status: AC

## 2017-11-09 MED ORDER — SODIUM CHLORIDE 0.9 % IV SOLN
Freq: Once | INTRAVENOUS | Status: AC
  Administered 2017-11-09: 09:00:00 via INTRAVENOUS

## 2017-11-09 MED ORDER — ONDANSETRON 4 MG PO TBDP: 4.0000 mg | ORAL_TABLET | Freq: Three times a day (TID) | ORAL | 0 refills | Status: DC | PRN

## 2017-11-09 MED ORDER — HYDROCOD POLST-CPM POLST ER 10-8 MG/5ML PO SUER: 5.0000 mL | Freq: Two times a day (BID) | ORAL | 0 refills | Status: DC

## 2017-11-09 MED ORDER — MORPHINE SULFATE (PF) 4 MG/ML IV SOLN
4.0000 mg | Freq: Once | INTRAVENOUS | Status: AC
  Administered 2017-11-09: 4 mg via INTRAVENOUS
  Filled 2017-11-09: qty 1

## 2017-11-09 MED ORDER — SODIUM CHLORIDE 0.9 % IV BOLUS (SEPSIS)
500.0000 mL | Freq: Once | INTRAVENOUS | Status: AC
  Administered 2017-11-09: 500 mL via INTRAVENOUS

## 2017-11-09 NOTE — ED Provider Notes (Signed)
[facility] Emergency Department Provider Note       Time seen: ----------------------------------------- 8:36 AM on 11/09/2017 -----------------------------------------   I have reviewed the triage vital signs and the nursing notes.  HISTORY   Chief Complaint Emesis and Fever    HPI Christian [name] is a 77 y.o. male with a history of coronary artery disease, DVT, hyperlipidemia, hypertension, MI who presents to the ED for fever and vomiting.  Patient states he has been vomiting for the past few days and having increased weakness.  He is having generalized body aches as well with cough.  He vomited twice this morning, he denies any current chest pain or shortness of breath.  Pain is 0 out of 10 at this time.  Past Medical History:  Diagnosis Date  . Aortic aneurysm ([place])    followed by vascular surgery yearly.  . Bladder cancer [facility])    s/p bladder resection. [facility]  . Coronary artery disease    AMI anterior wall 1997; s/p PTCA to LAD 11/1995 [facility].  Marland Kitchen DVT (deep venous thrombosis) ([place]) 10/07/2007  . Glucose intolerance (impaired glucose tolerance)   . Hydrocele   . Hyperlipidemia   . Hypertension   . MI (myocardial infarction) ([place]) 1997  . Nephrolithiasis   . Sleep apnea     Patient Active Problem List   Diagnosis Date Noted  . Chronic deep vein thrombosis (DVT) of lower extremity ([place]) 06/05/2017  . Aortic atherosclerosis ([place]) 12/04/2016  . Other abnormal glucose 11/04/2013  . History of bladder cancer 11/04/2013  . Pure hypercholesterolemia 07/16/2010  . OBESITY-MORBID (>100') 07/16/2010  . Atherosclerosis of native coronary artery of native heart with stable angina pectoris ([place]) 07/16/2010  . Abdominal aortic aneurysm ([place]) 07/16/2010    Past Surgical History:  Procedure Laterality Date  . [place]  . bladder removed    . CARDIAC CATHETERIZATION    . COLONOSCOPY  08/27/2011   normal.  [facility].  Marland Kitchen  HYDROCELE EXCISION    . PROSTATE SURGERY     prostatectomy with bladder resection.    Allergies Ceftriaxone; Sulfasalazine; and Sulfa antibiotics  Social History Social History   Tobacco Use  . Smoking status: Never Smoker  . Smokeless tobacco: Never Used  . Tobacco comment: tobacco use- no   Substance Use Topics  . Alcohol use: No  . Drug use: No    Review of Systems Constitutional: Positive for fever Eyes: Negative for vision changes ENT:  Negative for congestion, sore throat Cardiovascular: Negative for chest pain. Respiratory: Negative for shortness of breath. Gastrointestinal: Negative for abdominal pain, positive for vomiting Genitourinary: Negative for dysuria. Musculoskeletal: Negative for back pain. Skin: Negative for rash. Neurological: Negative for headaches, positive for generalized weakness  All systems negative/normal/unremarkable except as stated in the HPI  ____________________________________________   PHYSICAL EXAM:  VITAL SIGNS: ED Triage Vitals  Enc Vitals Group     BP 11/09/17 0822 (!) 147/67     Pulse Rate 11/09/17 0822 64     Resp 11/09/17 0822 18     Temp 11/09/17 0821 99.3 F (37.4 C)     Temp Source 11/09/17 0821 Oral     SpO2 11/09/17 0822 92 %     Weight 11/09/17 0822 285 lb (129.3 kg)     Height 11/09/17 0822 5\' 11"  (1.803 m)     Head Circumference --      Peak Flow --      Pain Score 11/09/17  0824 0     Pain Loc --      Pain Edu? --      Excl. in [place]? --    Constitutional: Alert and oriented. Well appearing and in no distress. Eyes: Conjunctivae are normal. Normal extraocular movements. ENT   Head: Normocephalic and atraumatic.   Nose: No congestion/rhinnorhea.   Mouth/Throat: Mucous membranes are moist.   Neck: No stridor. Cardiovascular: Normal rate, regular rhythm. No murmurs, rubs, or gallops. Respiratory: Scattered wheezing and rhonchi are noted Gastrointestinal: Soft and nontender. Normal bowel sounds.   Urostomy site is unremarkable Musculoskeletal: Nontender with normal range of motion in extremities. No lower extremity tenderness nor edema. Neurologic:  Normal speech and language. No gross focal neurologic deficits are appreciated.  Skin:  Skin is warm, dry and intact. No rash noted. Psychiatric: Mood and affect are normal. Speech and behavior are normal.  ____________________________________________  ED COURSE:  As part of my medical decision making, I reviewed the following data within the [place] History obtained from family if available, nursing notes, old chart and ekg, as well as notes from prior ED visits. Patient presented for flulike symptoms with fever, cough and vomiting, we will assess with labs and imaging as indicated at this time.   Procedures ____________________________________________   LABS (pertinent positives/negatives)  Labs Reviewed  CBC WITH DIFFERENTIAL/PLATELET - Abnormal; Notable for the following components:      Result Value   RBC 4.09 (*)    RDW 14.6 (*)    Lymphs Abs 0.5 (*)    Monocytes Absolute 1.1 (*)    All other components within normal limits  COMPREHENSIVE METABOLIC PANEL - Abnormal; Notable for the following components:   Sodium 131 (*)    Chloride 95 (*)    Glucose, Bld 150 (*)    Calcium 8.8 (*)    AST 196 (*)    ALT 126 (*)    All other components within normal limits  URINALYSIS, COMPLETE (UACMP) WITH MICROSCOPIC - Abnormal; Notable for the following components:   Color, Urine YELLOW (*)    APPearance CLOUDY (*)    Hgb urine dipstick MODERATE (*)    Protein, ur 100 (*)    Nitrite POSITIVE (*)    Leukocytes, UA SMALL (*)    Bacteria, UA MANY (*)    Squamous Epithelial / LPF 0-5 (*)    Non Squamous Epithelial 0-5 (*)    All other components within normal limits  INFLUENZA PANEL BY PCR (TYPE A & B) - Abnormal; Notable for the following components:   Influenza A By PCR POSITIVE (*)    All other components  within normal limits  URINE CULTURE  TROPONIN I    RADIOLOGY Images were viewed by me  Chest x-ray Is unremarkable ____________________________________________  DIFFERENTIAL DIAGNOSIS   Influenza, viral syndrome, pneumonia, dehydration, electrolyte abnormality  FINAL ASSESSMENT AND PLAN  Influenza, UTI   Plan: The patient had presented for flu-like symptoms. Patient's labs were positive for influenza A as well as UTI.  He was given IV fluids as well as IV Zofran, Toradol and Cipro.  Patient's imaging did not reveal any pneumonia.  His symptoms are likely a combination of influenza and UTI.  Overall he appears stable for outpatient follow-up.   Laurence Aly, MD   Note: This note was generated in part or whole with voice recognition software. Voice recognition is usually quite accurate but there are transcription errors that can and very often do occur. I  apologize for any typographical errors that were not detected and corrected.     Earleen Newport, MD 11/09/17 [phone]

## 2017-11-09 NOTE — ED Triage Notes (Signed)
Pt arrived via POV from home with reports of fever and vomiting. Pt states he has been vomiting for the past few days and having increased weakness. Pt has generalized body aches as well.  Pt also states he has been coughing a lot as well.  Pt denies any pain at this time except for generalized body aches.   Denies any shortness of breath or chest pain. Denies diarrhea.  Reports vomiting x 2 this morning.

## 2017-11-10 ENCOUNTER — Encounter: Payer: Self-pay | Admitting: Family Medicine

## 2017-11-11 LAB — URINE CULTURE
Culture: >=100000 — AB
Special Requests: NORMAL

## 2017-11-11 MED ORDER — NITROFURANTOIN MONOHYD MACRO 100 MG PO CAPS: 100.0000 mg | ORAL_CAPSULE | Freq: Two times a day (BID) | ORAL | 0 refills | Status: DC

## 2017-11-11 MED ORDER — OSELTAMIVIR PHOSPHATE 75 MG PO CAPS
75.0000 mg | ORAL_CAPSULE | Freq: Two times a day (BID) | ORAL | 0 refills | Status: DC
Start: 2017-11-11 — End: 2017-11-20

## 2017-11-11 MED ORDER — PREDNISONE 20 MG PO TABS: ORAL_TABLET | ORAL | 0 refills | Status: DC

## 2017-11-11 MED ORDER — GUAIFENESIN-CODEINE 100-10 MG/5ML PO SOLN: 5.0000 mL | Freq: Four times a day (QID) | ORAL | 0 refills | Status: DC | PRN

## 2017-11-11 MED ORDER — ALBUTEROL SULFATE HFA 108 (90 BASE) MCG/ACT IN AERS: 2.0000 | INHALATION_SPRAY | RESPIRATORY_TRACT | 1 refills | Status: DC | PRN

## 2017-11-11 NOTE — Telephone Encounter (Signed)
Spoke with wife: pt presented with fever, vomiting, coughing to ED.  Tested positive for influenza A.  Urine cx + E. Coli resistant to Cipro.  Finally ate chicken noodle soup yesterday and crackers today.  Cough is terrible. Wheezing noted on ED provider's note.  Pt with history of bronchospasm with acute illnesses A/P: Influenza A: current guidelines recommend treating with Tamiflu outside of 48 hours in patients with comorbidities and with lower respiratory symptoms. Tamiflu sent to [facility]; will also send in [place] as pt allergic to Rocephin and Sulfa. Will also send in Prednisone and Albuterol inhaler.

## 2017-11-17 ENCOUNTER — Telehealth: Payer: Self-pay | Admitting: Cardiovascular Disease

## 2017-11-17 ENCOUNTER — Encounter: Payer: Self-pay | Admitting: Emergency Medicine

## 2017-11-17 ENCOUNTER — Emergency Department: Payer: Medicare HMO

## 2017-11-17 ENCOUNTER — Inpatient Hospital Stay
Admission: EM | Admit: 2017-11-17 | Discharge: 2017-11-20 | DRG: 872 | Disposition: A | Discharge status: Home / Self Care | Payer: Medicare HMO | Attending: Internal Medicine | Admitting: Internal Medicine

## 2017-11-17 ENCOUNTER — Other Ambulatory Visit: Payer: Self-pay

## 2017-11-17 DIAGNOSIS — Z87442 Personal history of urinary calculi: Secondary | ICD-10-CM

## 2017-11-17 DIAGNOSIS — Z823 Family history of stroke: Secondary | ICD-10-CM

## 2017-11-17 DIAGNOSIS — Z955 Presence of coronary angioplasty implant and graft: Secondary | ICD-10-CM

## 2017-11-17 DIAGNOSIS — I25118 Atherosclerotic heart disease of native coronary artery with other forms of angina pectoris: Secondary | ICD-10-CM | POA: Diagnosis not present

## 2017-11-17 DIAGNOSIS — E876 Hypokalemia: Secondary | ICD-10-CM | POA: Diagnosis not present

## 2017-11-17 DIAGNOSIS — Z8551 Personal history of malignant neoplasm of bladder: Secondary | ICD-10-CM

## 2017-11-17 DIAGNOSIS — Z86718 Personal history of other venous thrombosis and embolism: Secondary | ICD-10-CM | POA: Diagnosis not present

## 2017-11-17 DIAGNOSIS — Z79899 Other long term (current) drug therapy: Secondary | ICD-10-CM

## 2017-11-17 DIAGNOSIS — I252 Old myocardial infarction: Secondary | ICD-10-CM

## 2017-11-17 DIAGNOSIS — Z801 Family history of malignant neoplasm of trachea, bronchus and lung: Secondary | ICD-10-CM | POA: Diagnosis not present

## 2017-11-17 DIAGNOSIS — R319 Hematuria, unspecified: Secondary | ICD-10-CM

## 2017-11-17 DIAGNOSIS — Z881 Allergy status to other antibiotic agents status: Secondary | ICD-10-CM

## 2017-11-17 DIAGNOSIS — R0902 Hypoxemia: Secondary | ICD-10-CM | POA: Diagnosis not present

## 2017-11-17 DIAGNOSIS — Z936 Other artificial openings of urinary tract status: Secondary | ICD-10-CM

## 2017-11-17 DIAGNOSIS — I10 Essential (primary) hypertension: Secondary | ICD-10-CM | POA: Diagnosis not present

## 2017-11-17 DIAGNOSIS — Z9089 Acquired absence of other organs: Secondary | ICD-10-CM

## 2017-11-17 DIAGNOSIS — E785 Hyperlipidemia, unspecified: Secondary | ICD-10-CM | POA: Diagnosis not present

## 2017-11-17 DIAGNOSIS — N390 Urinary tract infection, site not specified: Secondary | ICD-10-CM | POA: Diagnosis not present

## 2017-11-17 DIAGNOSIS — A419 Sepsis, unspecified organism: Secondary | ICD-10-CM | POA: Diagnosis not present

## 2017-11-17 DIAGNOSIS — R6520 Severe sepsis without septic shock: Secondary | ICD-10-CM | POA: Diagnosis present

## 2017-11-17 DIAGNOSIS — G4733 Obstructive sleep apnea (adult) (pediatric): Secondary | ICD-10-CM | POA: Diagnosis not present

## 2017-11-17 DIAGNOSIS — Z7982 Long term (current) use of aspirin: Secondary | ICD-10-CM | POA: Diagnosis not present

## 2017-11-17 DIAGNOSIS — Z1623 Resistance to quinolones and fluoroquinolones: Secondary | ICD-10-CM | POA: Diagnosis present

## 2017-11-17 DIAGNOSIS — Z7902 Long term (current) use of antithrombotics/antiplatelets: Secondary | ICD-10-CM

## 2017-11-17 DIAGNOSIS — Z882 Allergy status to sulfonamides status: Secondary | ICD-10-CM | POA: Diagnosis not present

## 2017-11-17 DIAGNOSIS — Z833 Family history of diabetes mellitus: Secondary | ICD-10-CM

## 2017-11-17 DIAGNOSIS — I2510 Atherosclerotic heart disease of native coronary artery without angina pectoris: Secondary | ICD-10-CM | POA: Diagnosis not present

## 2017-11-17 DIAGNOSIS — Z6839 Body mass index (BMI) 39.0-39.9, adult: Secondary | ICD-10-CM | POA: Diagnosis not present

## 2017-11-17 DIAGNOSIS — A4151 Sepsis due to Escherichia coli [E. coli]: Secondary | ICD-10-CM | POA: Diagnosis not present

## 2017-11-17 DIAGNOSIS — R079 Chest pain, unspecified: Secondary | ICD-10-CM | POA: Diagnosis not present

## 2017-11-17 DIAGNOSIS — R509 Fever, unspecified: Secondary | ICD-10-CM | POA: Diagnosis not present

## 2017-11-17 LAB — HEPATIC FUNCTION PANEL
ALBUMIN: 4 g/dL (ref 3.5–5.0)
ALK PHOS: 55 U/L (ref 38–126)
ALT: 59 U/L (ref 17–63)
AST: 61 U/L — ABNORMAL HIGH (ref 15–41)
BILIRUBIN TOTAL: 1 mg/dL (ref 0.3–1.2)
Bilirubin, Direct: 0.3 mg/dL (ref 0.1–0.5)
Indirect Bilirubin: 0.7 mg/dL (ref 0.3–0.9)
Total Protein: 7.8 g/dL (ref 6.5–8.1)

## 2017-11-17 LAB — CBC
HCT: 42.2 % (ref 40.0–52.0)
HCT: 39.1 % — ABNORMAL LOW (ref 40.0–52.0)
HEMOGLOBIN: 14.4 g/dL (ref 13.0–18.0)
Hemoglobin: 13.1 g/dL (ref 13.0–18.0)
MCH: 33.3 pg (ref 26.0–34.0)
MCH: 33.2 pg (ref 26.0–34.0)
MCHC: 34.2 g/dL (ref 32.0–36.0)
MCHC: 33.6 g/dL (ref 32.0–36.0)
MCV: 97.3 fL (ref 80.0–100.0)
MCV: 98.9 fL (ref 80.0–100.0)
PLATELETS: 230 10*3/uL (ref 150–440)
PLATELETS: 223 10*3/uL (ref 150–440)
RBC: 4.34 MIL/uL — ABNORMAL LOW (ref 4.40–5.90)
RBC: 3.96 MIL/uL — ABNORMAL LOW (ref 4.40–5.90)
RDW: 14.3 % (ref 11.5–14.5)
RDW: 14.3 % (ref 11.5–14.5)
WBC: 18.9 10*3/uL — ABNORMAL HIGH (ref 3.8–10.6)
WBC: 14 10*3/uL — ABNORMAL HIGH (ref 3.8–10.6)

## 2017-11-17 LAB — URINALYSIS, COMPLETE (UACMP) WITH MICROSCOPIC
Bilirubin Urine: NEGATIVE
Glucose, UA: NEGATIVE mg/dL
KETONES UR: NEGATIVE mg/dL
Nitrite: POSITIVE — AB
PH: 5 (ref 5.0–8.0)
Protein, ur: 30 mg/dL — AB
Specific Gravity, Urine: 1.014 (ref 1.005–1.030)

## 2017-11-17 LAB — BASIC METABOLIC PANEL
Anion gap: 15 (ref 5–15)
BUN: 15 mg/dL (ref 6–20)
CALCIUM: 9.2 mg/dL (ref 8.9–10.3)
CO2: 22 mmol/L (ref 22–32)
CREATININE: 0.99 mg/dL (ref 0.61–1.24)
Chloride: 99 mmol/L — ABNORMAL LOW (ref 101–111)
GFR calc Af Amer: >60 mL/min (ref >60)
GFR calc non Af Amer: >60 mL/min (ref >60)
GLUCOSE: 144 mg/dL — AB (ref 65–99)
Potassium: 3.2 mmol/L — ABNORMAL LOW (ref 3.5–5.1)
Sodium: 136 mmol/L (ref 135–145)

## 2017-11-17 LAB — TROPONIN I: Troponin I: <0.03 ng/mL (ref <0.03)

## 2017-11-17 LAB — LACTIC ACID, PLASMA
LACTIC ACID, VENOUS: 4.1 mmol/L — AB (ref 0.5–1.9)
Lactic Acid, Venous: 2.2 mmol/L (ref 0.5–1.9)

## 2017-11-17 LAB — LIPASE, BLOOD: LIPASE: 28 U/L (ref 11–51)

## 2017-11-17 LAB — PROTIME-INR
INR: 0.97
Prothrombin Time: 12.8 seconds (ref 11.4–15.2)

## 2017-11-17 MED ORDER — ACETAMINOPHEN 650 MG RE SUPP: 650.0000 mg | Freq: Four times a day (QID) | RECTAL | Status: DC | PRN

## 2017-11-17 MED ORDER — VANCOMYCIN HCL 10 G IV SOLR
1250.0000 mg | Freq: Three times a day (TID) | INTRAVENOUS | Status: DC
  Administered 2017-11-18: 1250 mg via INTRAVENOUS
  Filled 2017-11-17 (×3): qty 1250

## 2017-11-17 MED ORDER — OMEGA-3-ACID ETHYL ESTERS 1 G PO CAPS
1000.0000 mg | ORAL_CAPSULE | Freq: Two times a day (BID) | ORAL | Status: DC
  Administered 2017-11-17 – 2017-11-20 (×6): 1000 mg via ORAL
  Filled 2017-11-17 (×6): qty 1

## 2017-11-17 MED ORDER — ACETAMINOPHEN 325 MG PO TABS
650.0000 mg | ORAL_TABLET | Freq: Four times a day (QID) | ORAL | Status: DC | PRN
  Administered 2017-11-18: 650 mg via ORAL
  Filled 2017-11-17: qty 2

## 2017-11-17 MED ORDER — GUAIFENESIN-CODEINE 100-10 MG/5ML PO SOLN
5.0000 mL | Freq: Four times a day (QID) | ORAL | Status: DC | PRN
  Administered 2017-11-19: 21:00:00 5 mL via ORAL
  Filled 2017-11-17: qty 5

## 2017-11-17 MED ORDER — ONDANSETRON HCL 4 MG/2ML IJ SOLN: 4.0000 mg | Freq: Four times a day (QID) | INTRAMUSCULAR | Status: DC | PRN

## 2017-11-17 MED ORDER — NITROGLYCERIN 0.4 MG SL SUBL: 0.4000 mg | SUBLINGUAL_TABLET | SUBLINGUAL | Status: DC | PRN

## 2017-11-17 MED ORDER — PIPERACILLIN-TAZOBACTAM 3.375 G IVPB
3.3750 g | Freq: Three times a day (TID) | INTRAVENOUS | Status: DC
Start: 2017-11-17 — End: 2017-11-18
  Administered 2017-11-17 – 2017-11-18 (×2): 3.375 g via INTRAVENOUS
  Filled 2017-11-17 (×2): qty 50

## 2017-11-17 MED ORDER — SODIUM CHLORIDE 0.9 % IV BOLUS
1000.0000 mL | Freq: Once | INTRAVENOUS | Status: AC
  Administered 2017-11-17: 1000 mL via INTRAVENOUS

## 2017-11-17 MED ORDER — SODIUM CHLORIDE 0.9 % IV BOLUS
500.0000 mL | Freq: Once | INTRAVENOUS | Status: AC
  Administered 2017-11-18: 500 mL via INTRAVENOUS

## 2017-11-17 MED ORDER — SODIUM CHLORIDE 0.9 % IV BOLUS
500.0000 mL | Freq: Once | INTRAVENOUS | Status: AC
  Administered 2017-11-17: 500 mL via INTRAVENOUS

## 2017-11-17 MED ORDER — ORAL CARE MOUTH RINSE
15.0000 mL | Freq: Two times a day (BID) | OROMUCOSAL | Status: DC
  Administered 2017-11-17 – 2017-11-19 (×4): 15 mL via OROMUCOSAL
  Filled 2017-11-17: qty 15

## 2017-11-17 MED ORDER — ATORVASTATIN CALCIUM 20 MG PO TABS
10.0000 mg | ORAL_TABLET | Freq: Every day | ORAL | Status: DC
  Administered 2017-11-17 – 2017-11-19 (×3): 10 mg via ORAL
  Filled 2017-11-17 (×3): qty 1

## 2017-11-17 MED ORDER — ALBUTEROL SULFATE (2.5 MG/3ML) 0.083% IN NEBU: 2.5000 mg | INHALATION_SOLUTION | RESPIRATORY_TRACT | Status: DC | PRN

## 2017-11-17 MED ORDER — POTASSIUM CHLORIDE CRYS ER 20 MEQ PO TBCR
40.0000 meq | EXTENDED_RELEASE_TABLET | Freq: Once | ORAL | Status: AC
  Administered 2017-11-17: 21:00:00 40 meq via ORAL
  Filled 2017-11-17: qty 2

## 2017-11-17 MED ORDER — ASPIRIN EC 81 MG PO TBEC
81.0000 mg | DELAYED_RELEASE_TABLET | Freq: Every day | ORAL | Status: DC
  Administered 2017-11-17 – 2017-11-20 (×4): 81 mg via ORAL
  Filled 2017-11-17 (×4): qty 1

## 2017-11-17 MED ORDER — SODIUM CHLORIDE 0.9 % IV BOLUS
1000.0000 mL | Freq: Once | INTRAVENOUS | Status: AC
  Administered 2017-11-17: 23:00:00 1000 mL via INTRAVENOUS

## 2017-11-17 MED ORDER — RAMIPRIL 10 MG PO CAPS
10.0000 mg | ORAL_CAPSULE | Freq: Every day | ORAL | Status: DC
  Administered 2017-11-18 – 2017-11-20 (×3): 10 mg via ORAL
  Filled 2017-11-17 (×4): qty 1

## 2017-11-17 MED ORDER — PIPERACILLIN-TAZOBACTAM 3.375 G IVPB 30 MIN
3.3750 g | Freq: Once | INTRAVENOUS | Status: AC
  Administered 2017-11-17: 3.375 g via INTRAVENOUS
  Filled 2017-11-17: qty 50

## 2017-11-17 MED ORDER — ONDANSETRON HCL 4 MG PO TABS
4.0000 mg | ORAL_TABLET | Freq: Four times a day (QID) | ORAL | Status: DC | PRN
  Administered 2017-11-17: 4 mg via ORAL
  Filled 2017-11-17: qty 1

## 2017-11-17 MED ORDER — VANCOMYCIN HCL IN DEXTROSE 1-5 GM/200ML-% IV SOLN
1000.0000 mg | Freq: Once | INTRAVENOUS | Status: AC
  Administered 2017-11-17: 23:00:00 1000 mg via INTRAVENOUS
  Filled 2017-11-17: qty 200

## 2017-11-17 MED ORDER — SODIUM CHLORIDE 0.9 % IV SOLN
INTRAVENOUS | Status: DC
  Administered 2017-11-18: via INTRAVENOUS

## 2017-11-17 MED ORDER — SODIUM CHLORIDE 0.9 % IV BOLUS
1000.0000 mL | Freq: Once | INTRAVENOUS | Status: AC
  Administered 2017-11-17: 22:00:00 1000 mL via INTRAVENOUS

## 2017-11-17 MED ORDER — CLOPIDOGREL BISULFATE 75 MG PO TABS
75.0000 mg | ORAL_TABLET | Freq: Every day | ORAL | Status: DC
  Administered 2017-11-17 – 2017-11-20 (×4): 75 mg via ORAL
  Filled 2017-11-17 (×4): qty 1

## 2017-11-17 MED ORDER — ENOXAPARIN SODIUM 40 MG/0.4ML ~~LOC~~ SOLN
40.0000 mg | SUBCUTANEOUS | Status: DC
  Administered 2017-11-17 – 2017-11-19 (×3): 40 mg via SUBCUTANEOUS
  Filled 2017-11-17 (×3): qty 0.4

## 2017-11-17 MED ORDER — IOPAMIDOL (ISOVUE-370) INJECTION 76%
75.0000 mL | Freq: Once | INTRAVENOUS | Status: AC | PRN
  Administered 2017-11-17: 75 mL via INTRAVENOUS

## 2017-11-17 MED ORDER — VANCOMYCIN HCL 10 G IV SOLR
1250.0000 mg | Freq: Three times a day (TID) | INTRAVENOUS | Status: DC
  Filled 2017-11-17 (×2): qty 1250

## 2017-11-17 MED ORDER — SENNOSIDES-DOCUSATE SODIUM 8.6-50 MG PO TABS: 1.0000 | ORAL_TABLET | Freq: Every evening | ORAL | Status: DC | PRN

## 2017-11-17 NOTE — Telephone Encounter (Signed)
Pt spouse calling stating patient has developed a palpitations, along with being sick  She is concerned for ER didn't do much when they went last Sunday.  She states he's been sick this past week  They told them he has an irregular heartbeat and sent him home.      Would like to know what to do

## 2017-11-17 NOTE — Telephone Encounter (Signed)
I looked at all the blood work Looked at EKG Only thing I could see that looked abnormal was low potassium 3.2 Could try some lunch juice, citrus, bananas Elevated heart rate but was normal rhythm on EKG Needs to hydrate as much as possible Will take several days before he starts feeling better Could contact primary care for Tamiflu if he is flu positive Other cultures are still pending will take 2 or 3 days to come back including urine and blood cultures

## 2017-11-17 NOTE — Progress Notes (Addendum)
Pharmacy Antibiotic Note  Christian [name] is a 77 y.o. male admitted on 11/17/2017 with sepsis.  Pharmacy has been consulted for vancomycin/zosyn dosing.  Plan: Vancomycin 1250mg  IV every 8 hours.  Goal trough 15-20 mcg/mL. Vancomycin trough before 4th dose 3/26@2330  Zosyn 3.375gm iv q8h   Height: 5\' 11"  (180.3 cm) Weight: 285 lb (129.3 kg) IBW/kg (Calculated) : 75.3  Temp (24hrs), Avg:98.2 F (36.8 C), Min:98.2 F (36.8 C), Max:98.2 F (36.8 C)  Recent Labs  Lab 11/17/17 1511 11/17/17 1645  WBC 18.9*  --   CREATININE 0.99  --   LATICACIDVEN  --  2.2*    Estimated Creatinine Clearance: 89.7 mL/min (by C-G formula based on SCr of 0.99 mg/dL).    Allergies  Allergen Reactions  . Ceftriaxone Hives  . Sulfasalazine Hives  . Sulfa Antibiotics Hives    Antimicrobials this admission: Anti-infectives (From admission, onward)   Start     Dose/Rate Route Frequency Ordered Stop   11/18/17 0000  vancomycin (VANCOCIN) 1,250 mg in sodium chloride 0.9 % 250 mL IVPB     1,250 mg 166.7 mL/hr over 90 Minutes Intravenous Every 8 hours 11/17/17 1957     11/17/17 2000  vancomycin (VANCOCIN) IVPB 1000 mg/200 mL premix     1,000 mg 200 mL/hr over 60 Minutes Intravenous  Once 11/17/17 1954     11/17/17 1745  piperacillin-tazobactam (ZOSYN) IVPB 3.375 g     3.375 g 100 mL/hr over 30 Minutes Intravenous  Once 11/17/17 1740 11/17/17 1843      Microbiology results: Recent Results (from the past 240 hour(s))  Urine culture     Status: Abnormal   Collection Time: 11/09/17  9:16 AM  Result Value Ref Range Status   Specimen Description   Final    URINE, CLEAN CATCH Performed at [facility], [address] [place], [place] [address]    Special Requests   Final    Normal Performed at [facility], [place],  [address]    Culture >=100,000 COLONIES/mL ESCHERICHIA COLI (A)  Final   Report Status 11/11/2017 FINAL  Final   Organism ID, Bacteria  ESCHERICHIA COLI (A)  Final      Susceptibility   Escherichia coli - MIC*    AMPICILLIN >=32 RESISTANT Resistant     CEFAZOLIN <=4 SENSITIVE Sensitive     CEFTRIAXONE <=1 SENSITIVE Sensitive     CIPROFLOXACIN >=4 RESISTANT Resistant     GENTAMICIN <=1 SENSITIVE Sensitive     IMIPENEM <=0.25 SENSITIVE Sensitive     NITROFURANTOIN <=16 SENSITIVE Sensitive     TRIMETH/SULFA <=20 SENSITIVE Sensitive     AMPICILLIN/SULBACTAM 16 INTERMEDIATE Intermediate     PIP/TAZO <=4 SENSITIVE Sensitive     Extended ESBL NEGATIVE Sensitive     * >=100,000 COLONIES/mL ESCHERICHIA COLI    Thank you for allowing pharmacy to be a part of this patient's care.  Donna Christen Torez Beauregard 11/17/2017 7:58 PM

## 2017-11-17 NOTE — ED Triage Notes (Signed)
Pt to ed with c/o chest pain, chills, nausea, dizziness, vomiting that started this am around 11 am.  Pt states "it felt like my heart was jumping out of my chest"

## 2017-11-17 NOTE — Progress Notes (Signed)
Advanced care plan.  Purpose of the Encounter: CODE STATUS  Parties in Attendance: Patient and family  Patient's Decision Capacity: Good  Subjective/Patient's story: Presented with fever, chills, body aches  Objective/Medical story Has elevated lactic acid, tachycardia Sepsis form uti infection  Goals of care determination: Advanced directives were discussed patient in detail. Cardiac resuscitation, intubation and ventilator needs were discussed.  Patient wants everything done if the need arises.  CODE STATUS: Full code  Time spent discussing advanced care planning: 16 minutes

## 2017-11-17 NOTE — Telephone Encounter (Signed)
Patient positive for the flu on 11/09/17 when he went to the ER last week. Went home on antibiotics. Patient's U/A also showed presence of bacteria. Patient has continued to run a fever as well as having nausea and vomiting. He is sweating and dizzy as well. Bp 167/89 and pulse 109. Patient feels like his heart is racing. Advised wife to take patient to the ED for evaluation. Wife said they were already going to go on that way as she is worried about her husband. Wife wants Dr Rockey Situ aware of patient going to the ER and his lab results.

## 2017-11-17 NOTE — Progress Notes (Signed)
CODE SEPSIS - PHARMACY COMMUNICATION  **Broad Spectrum Antibiotics should be administered within 1 hour of Sepsis diagnosis**  Time Code Sepsis Called/Page Received: 1918  Antibiotics Ordered: zosyn  Time of 1st antibiotic administration: 9295  Additional action taken by pharmacy: none  If necessary, Name of Provider/Nurse Contacted: none    Thomasenia Sales ,PharmD Clinical Pharmacist  11/17/2017  7:18 PM

## 2017-11-17 NOTE — ED Provider Notes (Addendum)
[facility] Emergency Department Provider Note  ____________________________________________   I have reviewed the triage vital signs and the nursing notes. Where available I have reviewed prior notes and, if possible and indicated, outside hospital notes.    HISTORY  Chief Complaint Chest Pain    HPI Christian [name] is a 77 y.o. male who was seen and treated recently for the flu and a urinary tract infection, at home, does have a history of AMI, DVT, PE, hypertension, sleep apnea morbid obesity, and urostomy status post bladder cancer.  Patient is being treated for urinary tract infection.  He did recently have to change antibiotics because of resistance, the patient states that what ever they called in for him, the name of which she does not know, he is nearly done with however over the last day he has been feeling progressively worse.  Since he has had the flu he has had a progressive cough, and feels short of breath walking around.  He denies any pain in his chest unless he is coughing.  Have also fever chills he states at home.  He denies any abdominal pain.  He has had normal bowel movements.  He has had some posttussive emesis.  He also just feels palpitations, and weak and generally unwell.  He wants to be admitted to the hospital so he can "find out what is wrong".  He states he does not want to go on like this.  He has had palpitations at home as well and he feels that he is generally and not a good state of health at this time Family is worried that the urinary tract infection may have "gone to his blood". Urine culture reviewed shows sensitivity to ceftriaxone and cefazolin gentamicin and imipenem etc. but resistance to Cipro which was what he was initially put on  Past Medical History:  Diagnosis Date  . Aortic aneurysm ([place])    followed by vascular surgery yearly.  . Bladder cancer [facility])    s/p bladder resection. [place]  . Coronary artery disease    AMI  anterior wall 1997; s/p PTCA to LAD 11/1995 [facility].  Marland Kitchen DVT (deep venous thrombosis) ([place]) 10/07/2007  . Glucose intolerance (impaired glucose tolerance)   . Hydrocele   . Hyperlipidemia   . Hypertension   . MI (myocardial infarction) ([place]) 1997  . Nephrolithiasis   . Sleep apnea     Patient Active Problem List   Diagnosis Date Noted  . Chronic deep vein thrombosis (DVT) of lower extremity ([place]) 06/05/2017  . Aortic atherosclerosis ([place]) 12/04/2016  . Other abnormal glucose 11/04/2013  . History of bladder cancer 11/04/2013  . Pure hypercholesterolemia 07/16/2010  . OBESITY-MORBID (>100') 07/16/2010  . Atherosclerosis of native coronary artery of native heart with stable angina pectoris ([place]) 07/16/2010  . Abdominal aortic aneurysm ([place]) 07/16/2010    Past Surgical History:  Procedure Laterality Date  . [place]  . bladder removed    . CARDIAC CATHETERIZATION    . COLONOSCOPY  08/27/2011   normal.  [facility].  Marland Kitchen HYDROCELE EXCISION    . PROSTATE SURGERY     prostatectomy with bladder resection.    Prior to Admission medications   Medication Sig Start Date End Date Taking? Authorizing Provider  albuterol (PROVENTIL HFA;VENTOLIN HFA) 108 (90 Base) MCG/ACT inhaler Inhale 2 puffs into the lungs every 4 (four) hours as needed for wheezing or shortness of breath (cough, shortness of breath or wheezing.). 11/11/17  [name], MD  aspirin (ASPIR-LOW) 81 MG EC tablet Take 81 mg by mouth daily.      [provider]  atorvastatin (LIPITOR) 10 MG tablet Take 1 tablet (10 mg total) by mouth daily at 6 PM. 11/03/17   [name], MD  chlorpheniramine-HYDROcodone [facility] PENNKINETIC ER) 10-8 MG/5ML SUER Take 5 mLs by mouth 2 (two) times daily. 11/09/17   Earleen Newport, MD  clopidogrel (PLAVIX) 75 MG tablet Take 1 tablet (75 mg total) by mouth daily. 11/03/17   [name], MD  guaiFENesin-codeine 100-10 MG/5ML syrup Take 5-10 mLs by  mouth every 6 (six) hours as needed for cough. 11/11/17   [name], MD  nitrofurantoin, macrocrystal-monohydrate, (MACROBID) 100 MG capsule Take 1 capsule (100 mg total) by mouth 2 (two) times daily. 11/11/17   [name], MD  nitroGLYCERIN (NITROSTAT) 0.4 MG SL tablet Place 1 tablet (0.4 mg total) under the tongue every 5 (five) minutes as needed for chest pain. 04/16/16   [name], MD  Omega-3 Fatty Acids (RA FISH OIL) 1000 MG CAPS Take by mouth. Take 2 tabs in morning and 2 tabs i afternoon     [provider]  ondansetron (ZOFRAN ODT) 4 MG disintegrating tablet Take 1 tablet (4 mg total) by mouth every 8 (eight) hours as needed for nausea or vomiting. 11/09/17   Earleen Newport, MD  oseltamivir (TAMIFLU) 75 MG capsule Take 1 capsule (75 mg total) by mouth 2 (two) times daily. 11/11/17   [name], MD  predniSONE (DELTASONE) 20 MG tablet Take 3 PO QAM x 1 day, 2 PO QAM x 5 days, 1 PO QAM x 5 days 11/11/17   [name], MD  ramipril (ALTACE) 10 MG capsule Take 1 capsule (10 mg total) by mouth daily. 11/03/17   [name], MD    Allergies Ceftriaxone; Sulfasalazine; and Sulfa antibiotics  Family History  Problem Relation Age of Onset  . Stroke Mother   . Diabetes Mother   . Lung cancer Father   . Cancer Father        lung cancer  . Diabetes Sister   . Diabetes Brother     Social History Social History   Tobacco Use  . Smoking status: Never Smoker  . Smokeless tobacco: Never Used  . Tobacco comment: tobacco use- no   Substance Use Topics  . Alcohol use: No  . Drug use: No    Review of Systems Constitutional: + fever/chills Eyes: No visual changes. ENT: No sore throat. No stiff neck no neck pain Cardiovascular: Denies chest pain unless coughing Respiratory:+  shortness of breath. Gastrointestinal:   + vomiting.  No diarrhea.  No constipation. Genitourinary: Negative for dysuria.  Has urostomy Musculoskeletal: Negative lower  extremity swelling Skin: Negative for rash. Neurological: Negative for severe headaches, focal weakness or numbness.   ____________________________________________   PHYSICAL EXAM:  VITAL SIGNS: ED Triage Vitals  Enc Vitals Group     BP 11/17/17 1504 124/70     Pulse Rate 11/17/17 1504 (!) 109     Resp 11/17/17 1504 18     Temp 11/17/17 1504 98.2 F (36.8 C)     Temp Source 11/17/17 1504 Oral     SpO2 11/17/17 1504 92 %     Weight 11/17/17 1505 285 lb (129.3 kg)     Height 11/17/17 1505 5\' 11"  (1.803 m)     Head Circumference --  Peak Flow --      Pain Score 11/17/17 1505 4     Pain Loc --      Pain Edu? --      Excl. in [place]? --     Constitutional: Alert and oriented. Well appearing and in no acute distress. Eyes: Conjunctivae are normal Head: Atraumatic HEENT: No congestion/rhinnorhea. Mucous membranes are moist.  Oropharynx non-erythematous Neck:   Nontender with no meningismus, no masses, no stridor Cardiovascular: Normal rate, regular rhythm. Grossly normal heart sounds.  Good peripheral circulation. Respiratory: Normal respiratory effort.  No retractions. Lungs CTAB. Abdominal: Soft and nontender. No distention. No guarding no rebound urostomy in place and healthy-appearing with yellow urine Back:  There is no focal tenderness or step off.  there is no midline tenderness there are no lesions noted. there is no CVA tenderness Musculoskeletal: No lower extremity tenderness, no upper extremity tenderness. No joint effusions, no DVT signs strong distal pulses no edema Neurologic:  Normal speech and language. No gross focal neurologic deficits are appreciated.  Skin:  Skin is warm, dry and intact. No rash noted. Psychiatric: Mood and affect are normal. Speech and behavior are normal.  ____________________________________________   LABS (all labs ordered are listed, but only abnormal results are displayed)  Labs Reviewed  BASIC METABOLIC PANEL - Abnormal; Notable  for the following components:      Result Value   Potassium 3.2 (*)    Chloride 99 (*)    Glucose, Bld 144 (*)    All other components within normal limits  CBC - Abnormal; Notable for the following components:   WBC 18.9 (*)    RBC 4.34 (*)    All other components within normal limits  LACTIC ACID, PLASMA - Abnormal; Notable for the following components:   Lactic Acid, Venous 2.2 (*)    All other components within normal limits  URINE CULTURE  CULTURE, BLOOD (ROUTINE X 2)  CULTURE, BLOOD (ROUTINE X 2)  TROPONIN I  PROTIME-INR  HEPATIC FUNCTION PANEL  LIPASE, BLOOD  URINALYSIS, COMPLETE (UACMP) WITH MICROSCOPIC  LACTIC ACID, PLASMA    Pertinent labs  results that were available during my care of the patient were reviewed by me and considered in my medical decision making (see chart for details). ____________________________________________  EKG  I personally interpreted any EKGs ordered by me or triage Is tach rate 109 no acute ST elevation or depression normal axis, ____________________________________________  RADIOLOGY  Pertinent labs & imaging results that were available during my care of the patient were reviewed by me and considered in my medical decision making (see chart for details). If possible, patient and/or family made aware of any abnormal findings.  Dg Chest 2 View  Result Date: 11/17/2017 CLINICAL DATA:  Chest pain, nausea EXAM: CHEST - 2 VIEW COMPARISON:  11/09/2017 FINDINGS: Left base atelectasis. Right lung clear. Heart is normal size. No effusions or acute bony abnormality. IMPRESSION: Left base atelectasis.  No active disease. Electronically Signed   By: Rolm Baptise M.D.   On: 11/17/2017 15:40   Ct Angio Chest Pe W And/or Wo Contrast  Result Date: 11/17/2017 CLINICAL DATA:  Chest pain and palpitations. EXAM: CT ANGIOGRAPHY CHEST WITH CONTRAST TECHNIQUE: Multidetector CT imaging of the chest was performed using the standard protocol during bolus  administration of intravenous contrast. Multiplanar CT image reconstructions and MIPs were obtained to evaluate the vascular anatomy. CONTRAST:  1mL ISOVUE-370 IOPAMIDOL (ISOVUE-370) INJECTION 76% COMPARISON:  Chest x-ray from same day. FINDINGS: Cardiovascular: Satisfactory  opacification of the pulmonary arteries to the segmental level. No evidence of pulmonary embolism. Normal heart size. No pericardial effusion. Normal caliber thoracic aorta. Coronary, aortic arch, and branch vessel atherosclerotic vascular disease. Mediastinum/Nodes: No enlarged mediastinal, hilar, or axillary lymph nodes. Thyroid gland, trachea, and esophagus demonstrate no significant findings. Lungs/Pleura: Mild upper lobe predominant centrilobular emphysema. Mild central peribronchial thickening, particularly in the lower lobes. There are a few 3 mm pulmonary nodules in the right upper lobe. There is a 6 x 5 mm subpleural nodule in the superior segment of the left lower lobe (series 6, image 22). 6 mm triangular nodule in the left upper lobe may represent an intrapulmonary lymph node (series 6, image 34). Bibasilar atelectasis. No focal consolidation, pleural effusion, or pneumothorax. Upper Abdomen: No acute abnormality.  Hepatic steatosis. Musculoskeletal: No acute or significant osseous findings. Degenerative changes of the thoracic spine. Review of the MIP images confirms the above findings. IMPRESSION: 1. No evidence of pulmonary embolism. No acute intrathoracic process. 2. 6 mm subpleural nodule in the superior segment of the left lower lobe, possibly reflecting pleuroparenchymal scar. Non-contrast chest CT at 6-12 months is recommended. If the nodule is stable at time of repeat CT, then future CT at 18-24 months (from today's scan) is considered optional for low-risk patients, but is recommended for high-risk patients. This recommendation follows the consensus statement: Guidelines for Management of Incidental Pulmonary Nodules  Detected on CT Images: From the Fleischner Society 2017; Radiology 2017; 284:228-243. 3.  Emphysema (ICD10-J43.9). 4.  Aortic atherosclerosis (ICD10-I70.0). 5. Hepatic steatosis. Electronically Signed   By: Titus Dubin M.D.   On: 11/17/2017 17:17   ____________________________________________    PROCEDURES  Procedure(s) performed: None  Procedures  Critical Care performed: None  ____________________________________________   INITIAL IMPRESSION / ASSESSMENT AND PLAN / ED COURSE  Pertinent labs & imaging results that were available during my care of the patient were reviewed by me and considered in my medical decision making (see chart for details).  Patient here feeling generally unwell lactic acid is borderline positive, we are giving him IV fluids here, he is being treated for UTI.  Because of his cough and the fact that he desaturated to the high 80s when sitting here off oxygen I did a CT scan to look for PE and/or occult pneumonia.  Patient does have a history of clotting problems.  CT is fortunately negative.  At this time, however he does have an elevated white count, he is mildly tachycardic and I feel that he has possibly getting worse instead of better from his urinary tract infection.  We are going to give him IV Rocephin as I think this will cover him, we will give him IV fluids and patient will need to be admitted for further observation and workup.  Dates he does not have any penicillin or penicillin related allergy.  He has a rash with ceftriaxone.  Has had penicillin many times in the past with no difficulties given his urine results we will try Zosyn.    ____________________________________________   FINAL CLINICAL IMPRESSION(S) / ED DIAGNOSES  Final diagnoses:  None      This chart was dictated using voice recognition software.  Despite best efforts to proofread,  errors can occur which can change meaning.      Schuyler Amor, MD 11/17/17 1726     Schuyler Amor, MD 11/17/17 1740    Schuyler Amor, MD 11/17/17 [phone]

## 2017-11-17 NOTE — ED Notes (Signed)
Patient transported to 131

## 2017-11-17 NOTE — Progress Notes (Signed)
Notified Dr Jannifer Franklin of Lactic acid of 4.1.  See new orders

## 2017-11-17 NOTE — H&P (Signed)
[place] [facility] [place] NAME: Christian [name]    MR#:  [id number]  DATE OF BIRTH:  [birth year]  DATE OF ADMISSION:  11/17/2017  PRIMARY CARE PHYSICIAN: [name], MD   REQUESTING/REFERRING PHYSICIAN:   CHIEF COMPLAINT:   Chief Complaint  Patient presents with  . Chest Pain    HISTORY OF PRESENT ILLNESS: Christian [name]  is a 77 y.o. male with a known history of bladder cancer, hyperlipidemia, hypertension, nephrolithiasis, sleep apnea presented to the emergency room with cough, chills, fever and chest discomfort on deep breathing.  Patient was recently discharged from our hospital after being treated for flu and E. coli urinary tract infection.  He finished a course of antibiotics and Tamiflu tablets.  He presented today to the emergency room with low-grade fever, chills and body aches.  He also had some cough.  Patient was evaluated in the emergency room his lactic acid level was elevated.  He was started on IV antibiotics and was given IV fluids and cultures were sent.  EKG was reviewed by cardiology no ST segment changes.  No history of fall and head injury.  Patient was worked up with CTA chest which showed no pulmonary embolism. Hospitalist service was consulted for further care.  PAST MEDICAL HISTORY:   Past Medical History:  Diagnosis Date  . Aortic aneurysm ([place])    followed by vascular surgery yearly.  . Bladder cancer [facility])    s/p bladder resection. [place]  . Coronary artery disease    AMI anterior wall 1997; s/p PTCA to LAD 11/1995 [facility].  Marland Kitchen DVT (deep venous thrombosis) ([place]) 10/07/2007  . Glucose intolerance (impaired glucose tolerance)   . Hydrocele   . Hyperlipidemia   . Hypertension   . MI (myocardial infarction) ([place]) 1997  . Nephrolithiasis   . Sleep apnea     PAST SURGICAL HISTORY:  Past Surgical History:  Procedure Laterality Date  . [place]  . bladder removed    . CARDIAC CATHETERIZATION     . COLONOSCOPY  08/27/2011   normal.  [facility].  Marland Kitchen HYDROCELE EXCISION    . PROSTATE SURGERY     prostatectomy with bladder resection.    SOCIAL HISTORY:  Social History   Tobacco Use  . Smoking status: Never Smoker  . Smokeless tobacco: Never Used  . Tobacco comment: tobacco use- no   Substance Use Topics  . Alcohol use: No    FAMILY HISTORY:  Family History  Problem Relation Age of Onset  . Stroke Mother   . Diabetes Mother   . Lung cancer Father   . Cancer Father        lung cancer  . Diabetes Sister   . Diabetes Brother     DRUG ALLERGIES:  Allergies  Allergen Reactions  . Ceftriaxone Hives  . Sulfasalazine Hives  . Sulfa Antibiotics Hives    REVIEW OF SYSTEMS:   CONSTITUTIONAL: Had fever, fatigue and weakness.  EYES: No blurred or double vision.  EARS, NOSE, AND THROAT: No tinnitus or ear pain.  RESPIRATORY: No cough, shortness of breath, wheezing or hemoptysis.  CARDIOVASCULAR: chest pain on deep breathing,  No orthopnea, edema.  GASTROINTESTINAL: No nausea, vomiting, diarrhea or abdominal pain.  GENITOURINARY: Has dysuria,  No hematuria.  ENDOCRINE: No polyuria, nocturia,  HEMATOLOGY: No anemia, easy bruising or bleeding SKIN: No rash or lesion. MUSCULOSKELETAL: No joint pain or arthritis.   NEUROLOGIC: No tingling, numbness,  weakness.  PSYCHIATRY: No anxiety or depression.   MEDICATIONS AT HOME:  Prior to Admission medications   Medication Sig Start Date End Date Taking? Authorizing Provider  albuterol (PROVENTIL HFA;VENTOLIN HFA) 108 (90 Base) MCG/ACT inhaler Inhale 2 puffs into the lungs every 4 (four) hours as needed for wheezing or shortness of breath (cough, shortness of breath or wheezing.). 11/11/17  Yes [name], MD  aspirin (ASPIR-LOW) 81 MG EC tablet Take 81 mg by mouth daily.     Yes [provider]  atorvastatin (LIPITOR) 10 MG tablet Take 1 tablet (10 mg total) by mouth daily at 6 PM. 11/03/17  Yes [name], MD  clopidogrel (PLAVIX) 75 MG tablet Take 1 tablet (75 mg total) by mouth daily. 11/03/17  Yes [name], MD  nitrofurantoin, macrocrystal-monohydrate, (MACROBID) 100 MG capsule Take 1 capsule (100 mg total) by mouth 2 (two) times daily. 11/11/17  Yes [name], MD  nitroGLYCERIN (NITROSTAT) 0.4 MG SL tablet Place 1 tablet (0.4 mg total) under the tongue every 5 (five) minutes as needed for chest pain. 04/16/16  Yes [name], MD  Omega-3 Fatty Acids (RA FISH OIL) 1000 MG CAPS Take 1,000 mg by mouth 2 (two) times daily.    Yes [provider]  oseltamivir (TAMIFLU) 75 MG capsule Take 1 capsule (75 mg total) by mouth 2 (two) times daily. 11/11/17  Yes [name], MD  predniSONE (DELTASONE) 20 MG tablet Take 3 PO QAM x 1 day, 2 PO QAM x 5 days, 1 PO QAM x 5 days 11/11/17  Yes [name], MD  ramipril (ALTACE) 10 MG capsule Take 1 capsule (10 mg total) by mouth daily. 11/03/17  Yes [name], MD  chlorpheniramine-HYDROcodone [facility] PENNKINETIC ER) 10-8 MG/5ML SUER Take 5 mLs by mouth 2 (two) times daily. Patient not taking: Reported on 11/17/2017 11/09/17   Earleen Newport, MD  guaiFENesin-codeine 100-10 MG/5ML syrup Take 5-10 mLs by mouth every 6 (six) hours as needed for cough. Patient not taking: Reported on 11/17/2017 11/11/17   [name], MD  ondansetron (ZOFRAN ODT) 4 MG disintegrating tablet Take 1 tablet (4 mg total) by mouth every 8 (eight) hours as needed for nausea or vomiting. Patient not taking: Reported on 11/17/2017 11/09/17   Earleen Newport, MD      PHYSICAL EXAMINATION:   VITAL SIGNS: Blood pressure 126/76, pulse 96, temperature 98.2 F (36.8 C), temperature source Oral, resp. rate 18, height 5\' 11"  (1.803 m), weight 129.3 kg (285 lb), SpO2 91 %.  GENERAL:  77 y.o.-year-old patient lying in the bed with no acute distress on oxygen via nasal canula. EYES: Pupils equal, round, reactive to light and accommodation. No scleral  icterus. Extraocular muscles intact.  HEENT: Head atraumatic, normocephalic. Oropharynx and nasopharynx clear.  NECK:  Supple, no jugular venous distention. No thyroid enlargement, no tenderness.  LUNGS: Adequate breath sounds bilaterally, no wheezing, rales,rhonchi or crepitation. No use of accessory muscles of respiration.  CARDIOVASCULAR: S1, S2 tachycardia noted. No murmurs, rubs, or gallops.  ABDOMEN: Soft, nontender, nondistended. Bowel sounds present. No organomegaly or mass.  EXTREMITIES: No pedal edema, cyanosis, or clubbing.  NEUROLOGIC: Cranial nerves II through XII are intact. Muscle strength 5/5 in all extremities. Sensation intact. Gait not checked.  PSYCHIATRIC: The patient is alert and oriented x 3.  SKIN: No obvious rash, lesion, or ulcer.   LABORATORY PANEL:   CBC Recent Labs  Lab 11/17/17 1511  WBC  18.9*  HGB 14.4  HCT 42.2  PLT 230  MCV 97.3  MCH 33.3  MCHC 34.2  RDW 14.3   ------------------------------------------------------------------------------------------------------------------  Chemistries  Recent Labs  Lab 11/17/17 1511 11/17/17 1645  NA 136  --   K 3.2*  --   CL 99*  --   CO2 22  --   GLUCOSE 144*  --   BUN 15  --   CREATININE 0.99  --   CALCIUM 9.2  --   AST  --  61*  ALT  --  59  ALKPHOS  --  55  BILITOT  --  1.0   ------------------------------------------------------------------------------------------------------------------ estimated creatinine clearance is 89.7 mL/min (by C-G formula based on SCr of 0.99 mg/dL). ------------------------------------------------------------------------------------------------------------------ No results for input(s): TSH, T4TOTAL, T3FREE, THYROIDAB in the last 72 hours.  Invalid input(s): FREET3   Coagulation profile Recent Labs  Lab 11/17/17 1645  INR 0.97   ------------------------------------------------------------------------------------------------------------------- No results  for input(s): DDIMER in the last 72 hours. -------------------------------------------------------------------------------------------------------------------  Cardiac Enzymes Recent Labs  Lab 11/17/17 1511  TROPONINI <0.03   ------------------------------------------------------------------------------------------------------------------ Invalid input(s): POCBNP  ---------------------------------------------------------------------------------------------------------------  Urinalysis    Component Value Date/Time   COLORURINE AMBER (A) 11/17/2017 1645   APPEARANCEUR CLOUDY (A) 11/17/2017 1645   LABSPEC 1.014 11/17/2017 1645   PHURINE 5.0 11/17/2017 1645   GLUCOSEU NEGATIVE 11/17/2017 1645   HGBUR SMALL (A) 11/17/2017 1645   BILIRUBINUR NEGATIVE 11/17/2017 1645   BILIRUBINUR neg 03/01/2015 1132   KETONESUR NEGATIVE 11/17/2017 1645   PROTEINUR 30 (A) 11/17/2017 1645   UROBILINOGEN 0.2 03/01/2015 1132   NITRITE POSITIVE (A) 11/17/2017 1645   LEUKOCYTESUR SMALL (A) 11/17/2017 1645     RADIOLOGY: Dg Chest 2 View  Result Date: 11/17/2017 CLINICAL DATA:  Chest pain, nausea EXAM: CHEST - 2 VIEW COMPARISON:  11/09/2017 FINDINGS: Left base atelectasis. Right lung clear. Heart is normal size. No effusions or acute bony abnormality. IMPRESSION: Left base atelectasis.  No active disease. Electronically Signed   By: Rolm Baptise M.D.   On: 11/17/2017 15:40   Ct Angio Chest Pe W And/or Wo Contrast  Result Date: 11/17/2017 CLINICAL DATA:  Chest pain and palpitations. EXAM: CT ANGIOGRAPHY CHEST WITH CONTRAST TECHNIQUE: Multidetector CT imaging of the chest was performed using the standard protocol during bolus administration of intravenous contrast. Multiplanar CT image reconstructions and MIPs were obtained to evaluate the vascular anatomy. CONTRAST:  53mL ISOVUE-370 IOPAMIDOL (ISOVUE-370) INJECTION 76% COMPARISON:  Chest x-ray from same day. FINDINGS: Cardiovascular: Satisfactory  opacification of the pulmonary arteries to the segmental level. No evidence of pulmonary embolism. Normal heart size. No pericardial effusion. Normal caliber thoracic aorta. Coronary, aortic arch, and branch vessel atherosclerotic vascular disease. Mediastinum/Nodes: No enlarged mediastinal, hilar, or axillary lymph nodes. Thyroid gland, trachea, and esophagus demonstrate no significant findings. Lungs/Pleura: Mild upper lobe predominant centrilobular emphysema. Mild central peribronchial thickening, particularly in the lower lobes. There are a few 3 mm pulmonary nodules in the right upper lobe. There is a 6 x 5 mm subpleural nodule in the superior segment of the left lower lobe (series 6, image 22). 6 mm triangular nodule in the left upper lobe may represent an intrapulmonary lymph node (series 6, image 34). Bibasilar atelectasis. No focal consolidation, pleural effusion, or pneumothorax. Upper Abdomen: No acute abnormality.  Hepatic steatosis. Musculoskeletal: No acute or significant osseous findings. Degenerative changes of the thoracic spine. Review of the MIP images confirms the above findings. IMPRESSION: 1. No evidence of pulmonary embolism. No acute intrathoracic process. 2.  6 mm subpleural nodule in the superior segment of the left lower lobe, possibly reflecting pleuroparenchymal scar. Non-contrast chest CT at 6-12 months is recommended. If the nodule is stable at time of repeat CT, then future CT at 18-24 months (from today's scan) is considered optional for low-risk patients, but is recommended for high-risk patients. This recommendation follows the consensus statement: Guidelines for Management of Incidental Pulmonary Nodules Detected on CT Images: From the Fleischner Society 2017; Radiology 2017; 284:228-243. 3.  Emphysema (ICD10-J43.9). 4.  Aortic atherosclerosis (ICD10-I70.0). 5. Hepatic steatosis. Electronically Signed   By: Titus Dubin M.D.   On: 11/17/2017 17:17    EKG: Orders placed or  performed during the hospital encounter of 11/17/17  . EKG 12-Lead  . EKG 12-Lead  . ED EKG within 10 minutes  . ED EKG within 10 minutes    IMPRESSION AND PLAN:  77 year old elderly male patient with history of bladder cancer, hypertension, hyperlipidemia, sleep apnea, nephrolithiasis presented to the emergency room with fever, body aches, chills.  1.  Sepsis Admit patient to medical floor IV fluid hydration Start patient on IV vancomycin and IV Zosyn antibiotic Check blood and urine cultures  2.  Urinary tract infection Check her recent urine cultures which showed E. Coli sensitive to Zosyn antibiotic IV Zosyn antibiotic  3.  Hypokalemia Replace potassium orally  4.  Coronary artery disease Continue aspirin Plavix and statin  5.  DVT prophylaxis with Lovenox subcutaneously   All the records are reviewed and case discussed with ED provider. Management plans discussed with the patient, family and they are in agreement.  CODE STATUS:Full code Advance Directive Documentation     Most Recent Value  Type of Advance Directive  Living will  Pre-existing out of facility DNR order (yellow form or pink MOST form)  -  "MOST" Form in Place?  -       TOTAL TIME TAKING CARE OF THIS PATIENT: 50 minutes.    Saundra Shelling M.D on 11/17/2017 at 7:49 PM  Between 7am to 6pm - Pager - [phone]  After 6pm go to [email or web address] - password [email or web address] [place] [place] [facility]  Office  [phone]  CC: Primary care physician; [name], MD

## 2017-11-18 LAB — BLOOD CULTURE ID PANEL (REFLEXED)
Acinetobacter baumannii: NOT DETECTED
CANDIDA GLABRATA: NOT DETECTED
CANDIDA KRUSEI: NOT DETECTED
CANDIDA PARAPSILOSIS: NOT DETECTED
CARBAPENEM RESISTANCE: NOT DETECTED
Candida albicans: NOT DETECTED
Candida tropicalis: NOT DETECTED
ENTEROCOCCUS SPECIES: NOT DETECTED
ESCHERICHIA COLI: DETECTED — AB
Enterobacter cloacae complex: NOT DETECTED
Enterobacteriaceae species: DETECTED — AB
Haemophilus influenzae: NOT DETECTED
KLEBSIELLA OXYTOCA: NOT DETECTED
KLEBSIELLA PNEUMONIAE: NOT DETECTED
LISTERIA MONOCYTOGENES: NOT DETECTED
Neisseria meningitidis: NOT DETECTED
PSEUDOMONAS AERUGINOSA: NOT DETECTED
Proteus species: NOT DETECTED
SERRATIA MARCESCENS: NOT DETECTED
STAPHYLOCOCCUS SPECIES: NOT DETECTED
STREPTOCOCCUS PNEUMONIAE: NOT DETECTED
STREPTOCOCCUS PYOGENES: NOT DETECTED
Staphylococcus aureus (BCID): NOT DETECTED
Streptococcus agalactiae: NOT DETECTED
Streptococcus species: NOT DETECTED

## 2017-11-18 LAB — BASIC METABOLIC PANEL
ANION GAP: 8 (ref 5–15)
BUN: 11 mg/dL (ref 6–20)
CALCIUM: 7.8 mg/dL — AB (ref 8.9–10.3)
CHLORIDE: 105 mmol/L (ref 101–111)
CO2: 22 mmol/L (ref 22–32)
Creatinine, Ser: 0.9 mg/dL (ref 0.61–1.24)
GFR calc non Af Amer: >60 mL/min (ref >60)
GLUCOSE: 110 mg/dL — AB (ref 65–99)
POTASSIUM: 3.3 mmol/L — AB (ref 3.5–5.1)
Sodium: 135 mmol/L (ref 135–145)

## 2017-11-18 LAB — CREATININE, SERUM
Creatinine, Ser: 0.88 mg/dL (ref 0.61–1.24)
GFR calc Af Amer: >60 mL/min (ref >60)
GFR calc non Af Amer: >60 mL/min (ref >60)

## 2017-11-18 LAB — CBC
HCT: 35.3 % — ABNORMAL LOW (ref 40.0–52.0)
HEMOGLOBIN: 12.2 g/dL — AB (ref 13.0–18.0)
MCH: 34 pg (ref 26.0–34.0)
MCHC: 34.6 g/dL (ref 32.0–36.0)
MCV: 98.2 fL (ref 80.0–100.0)
Platelets: 190 10*3/uL (ref 150–440)
RBC: 3.6 MIL/uL — AB (ref 4.40–5.90)
RDW: 14.9 % — ABNORMAL HIGH (ref 11.5–14.5)
WBC: 9.5 10*3/uL (ref 3.8–10.6)

## 2017-11-18 LAB — MRSA PCR SCREENING: MRSA BY PCR: NEGATIVE

## 2017-11-18 LAB — LACTIC ACID, PLASMA: LACTIC ACID, VENOUS: 1.6 mmol/L (ref 0.5–1.9)

## 2017-11-18 MED ORDER — SODIUM CHLORIDE 0.9 % IV SOLN
1.0000 g | Freq: Three times a day (TID) | INTRAVENOUS | Status: DC
  Administered 2017-11-18 – 2017-11-20 (×6): 1 g via INTRAVENOUS
  Filled 2017-11-18 (×8): qty 1

## 2017-11-18 MED ORDER — POTASSIUM CHLORIDE CRYS ER 20 MEQ PO TBCR
40.0000 meq | EXTENDED_RELEASE_TABLET | ORAL | Status: AC
  Administered 2017-11-18 (×2): 40 meq via ORAL
  Filled 2017-11-18 (×2): qty 2

## 2017-11-18 NOTE — Telephone Encounter (Signed)
Spoke with patients wife per release form and she reports that he is in the hospital with pneumonia and bad UTI. Reviewed Dr. Donivan Scull recommendations and she was very appreciative for him looking at the chart. She had no further questions or concerns at this time. Instructed her to call back if any questions.

## 2017-11-18 NOTE — Progress Notes (Signed)
PHARMACY - PHYSICIAN COMMUNICATION CRITICAL VALUE ALERT - BLOOD CULTURE IDENTIFICATION (BCID)  Christian [name] is an 77 y.o. male who presented to [facility] on 11/17/2017   Name of physician (or Provider) Contacted: [place]  Current antibiotics: piperacillin/tazobactam  Changes to prescribed antibiotics recommended: Changing to meropenem 1 g IV q8h per discussion with MD  Results for orders placed or performed during the hospital encounter of 11/17/17  Blood Culture ID Panel (Reflexed) (Collected: 11/17/2017  4:46 PM)  Result Value Ref Range   Enterococcus species NOT DETECTED NOT DETECTED   Listeria monocytogenes NOT DETECTED NOT DETECTED   Staphylococcus species NOT DETECTED NOT DETECTED   Staphylococcus aureus NOT DETECTED NOT DETECTED   Streptococcus species NOT DETECTED NOT DETECTED   Streptococcus agalactiae NOT DETECTED NOT DETECTED   Streptococcus pneumoniae NOT DETECTED NOT DETECTED   Streptococcus pyogenes NOT DETECTED NOT DETECTED   Acinetobacter baumannii NOT DETECTED NOT DETECTED   Enterobacteriaceae species DETECTED (A) NOT DETECTED   Enterobacter cloacae complex NOT DETECTED NOT DETECTED   Escherichia coli DETECTED (A) NOT DETECTED   Klebsiella oxytoca NOT DETECTED NOT DETECTED   Klebsiella pneumoniae NOT DETECTED NOT DETECTED   Proteus species NOT DETECTED NOT DETECTED   Serratia marcescens NOT DETECTED NOT DETECTED   Carbapenem resistance NOT DETECTED NOT DETECTED   Haemophilus influenzae NOT DETECTED NOT DETECTED   Neisseria meningitidis NOT DETECTED NOT DETECTED   Pseudomonas aeruginosa NOT DETECTED NOT DETECTED   Candida albicans NOT DETECTED NOT DETECTED   Candida glabrata NOT DETECTED NOT DETECTED   Candida krusei NOT DETECTED NOT DETECTED   Candida parapsilosis NOT DETECTED NOT DETECTED   Candida tropicalis NOT DETECTED NOT [place], PharmD, BCPS Clinical Pharmacist 11/18/2017  9:19 AM

## 2017-11-18 NOTE — Progress Notes (Signed)
[place] [facility] [place] NAME: Maury Groninger    MR#:  [id number]  DATE OF BIRTH:  Jul 03, 1941  SUBJECTIVE:  CHIEF COMPLAINT:   Chief Complaint  Patient presents with  . Chest Pain   Patient's cough has resolved.  Continues to be febrile.  Feels weak.  No dysuria.  REVIEW OF SYSTEMS:    Review of Systems  Constitutional: Positive for chills, diaphoresis, fever and malaise/fatigue. Negative for weight loss.  HENT: Negative for hearing loss and nosebleeds.   Eyes: Negative for blurred vision, double vision and pain.  Respiratory: Negative for cough, hemoptysis, sputum production, shortness of breath and wheezing.   Cardiovascular: Negative for chest pain, palpitations, orthopnea and leg swelling.  Gastrointestinal: Negative for abdominal pain, constipation, diarrhea, nausea and vomiting.  Genitourinary: Negative for dysuria and hematuria.  Musculoskeletal: Negative for back pain, falls and myalgias.  Skin: Negative for rash.  Neurological: Negative for dizziness, tremors, sensory change, speech change, focal weakness, seizures and headaches.  Endo/Heme/Allergies: Does not bruise/bleed easily.  Psychiatric/Behavioral: Negative for depression and memory loss. The patient is not nervous/anxious.     DRUG ALLERGIES:   Allergies  Allergen Reactions  . Ceftriaxone Hives  . Sulfasalazine Hives  . Sulfa Antibiotics Hives    VITALS:  Blood pressure 121/61, pulse 82, temperature (!) 101.1 F (38.4 C), temperature source Oral, resp. rate 16, height 5\' 11"  (1.803 m), weight 129.3 kg (285 lb), SpO2 91 %.  PHYSICAL EXAMINATION:   Physical Exam  GENERAL:  77 y.o.-year-old patient lying in the bed with no acute distress.  EYES: Pupils equal, round, reactive to light and accommodation. No scleral icterus. Extraocular muscles intact.  HEENT: Head atraumatic, normocephalic. Oropharynx and nasopharynx clear.  NECK:  Supple, no jugular venous distention.  No thyroid enlargement, no tenderness.  LUNGS: Normal breath sounds bilaterally, no wheezing, rales, rhonchi. No use of accessory muscles of respiration.  CARDIOVASCULAR: S1, S2 normal. No murmurs, rubs, or gallops.  ABDOMEN: Soft, nontender, nondistended. Bowel sounds present. No organomegaly or mass.  EXTREMITIES: No cyanosis, clubbing or edema b/l.    NEUROLOGIC: Cranial nerves II through XII are intact. No focal Motor or sensory deficits b/l.   PSYCHIATRIC: The patient is alert and oriented x 3.  SKIN: No obvious rash, lesion, or ulcer.   LABORATORY PANEL:   CBC Recent Labs  Lab 11/18/17 0733  WBC 9.5  HGB 12.2*  HCT 35.3*  PLT 190   ------------------------------------------------------------------------------------------------------------------ Chemistries  Recent Labs  Lab 11/17/17 1645  11/18/17 0733  NA  --   --  135  K  --   --  3.3*  CL  --   --  105  CO2  --   --  22  GLUCOSE  --   --  110*  BUN  --   --  11  CREATININE  --    < > 0.90  CALCIUM  --   --  7.8*  AST 61*  --   --   ALT 59  --   --   ALKPHOS 55  --   --   BILITOT 1.0  --   --    < > = values in this interval not displayed.   ------------------------------------------------------------------------------------------------------------------  Cardiac Enzymes Recent Labs  Lab 11/17/17 1511  TROPONINI <0.03   ------------------------------------------------------------------------------------------------------------------  RADIOLOGY:  Dg Chest 2 View  Result Date: 11/17/2017 CLINICAL DATA:  Chest pain, nausea EXAM: CHEST - 2 VIEW COMPARISON:  11/09/2017 FINDINGS:  Left base atelectasis. Right lung clear. Heart is normal size. No effusions or acute bony abnormality. IMPRESSION: Left base atelectasis.  No active disease. Electronically Signed   By: Rolm Baptise M.D.   On: 11/17/2017 15:40   Ct Angio Chest Pe W And/or Wo Contrast  Result Date: 11/17/2017 CLINICAL DATA:  Chest pain and  palpitations. EXAM: CT ANGIOGRAPHY CHEST WITH CONTRAST TECHNIQUE: Multidetector CT imaging of the chest was performed using the standard protocol during bolus administration of intravenous contrast. Multiplanar CT image reconstructions and MIPs were obtained to evaluate the vascular anatomy. CONTRAST:  78mL ISOVUE-370 IOPAMIDOL (ISOVUE-370) INJECTION 76% COMPARISON:  Chest x-ray from same day. FINDINGS: Cardiovascular: Satisfactory opacification of the pulmonary arteries to the segmental level. No evidence of pulmonary embolism. Normal heart size. No pericardial effusion. Normal caliber thoracic aorta. Coronary, aortic arch, and branch vessel atherosclerotic vascular disease. Mediastinum/Nodes: No enlarged mediastinal, hilar, or axillary lymph nodes. Thyroid gland, trachea, and esophagus demonstrate no significant findings. Lungs/Pleura: Mild upper lobe predominant centrilobular emphysema. Mild central peribronchial thickening, particularly in the lower lobes. There are a few 3 mm pulmonary nodules in the right upper lobe. There is a 6 x 5 mm subpleural nodule in the superior segment of the left lower lobe (series 6, image 22). 6 mm triangular nodule in the left upper lobe may represent an intrapulmonary lymph node (series 6, image 34). Bibasilar atelectasis. No focal consolidation, pleural effusion, or pneumothorax. Upper Abdomen: No acute abnormality.  Hepatic steatosis. Musculoskeletal: No acute or significant osseous findings. Degenerative changes of the thoracic spine. Review of the MIP images confirms the above findings. IMPRESSION: 1. No evidence of pulmonary embolism. No acute intrathoracic process. 2. 6 mm subpleural nodule in the superior segment of the left lower lobe, possibly reflecting pleuroparenchymal scar. Non-contrast chest CT at 6-12 months is recommended. If the nodule is stable at time of repeat CT, then future CT at 18-24 months (from today's scan) is considered optional for low-risk patients,  but is recommended for high-risk patients. This recommendation follows the consensus statement: Guidelines for Management of Incidental Pulmonary Nodules Detected on CT Images: From the Fleischner Society 2017; Radiology 2017; 284:228-243. 3.  Emphysema (ICD10-J43.9). 4.  Aortic atherosclerosis (ICD10-I70.0). 5. Hepatic steatosis. Electronically Signed   By: Titus Dubin M.D.   On: 11/17/2017 17:17     ASSESSMENT AND PLAN:   *E. coli bacteremia likely secondary to UTI.  Recent E. coli was not drug-resistant.  But at this time due to bacteremia will change antibiotics to meropenem.  Wait for final culture and sensitivities.  *Sepsis present on admission has resolved.  Stop IV fluids.  *CAD stable.  *Hypertension.  Continue home medications.  All the records are reviewed and case discussed with Care Management/Social Worker Management plans discussed with the patient, family and they are in agreement.  CODE STATUS: Full code  DVT Prophylaxis: SCDs  TOTAL TIME TAKING CARE OF THIS PATIENT: 35 minutes.   POSSIBLE D/C IN 2-3 DAYS, DEPENDING ON CLINICAL CONDITION.  Leia Alf Jilliana Burkes M.D on 11/18/2017 at 11:11 AM  Between 7am to 6pm - Pager - [phone]  After 6pm go to [email or web address] - password [email or web address] [place]  Office  [phone]  CC: Primary care physician; [name], MD  Note: This dictation was prepared with Dragon dictation along with smaller phrase technology. Any transcriptional errors that result from this process are unintentional.

## 2017-11-19 NOTE — Progress Notes (Signed)
[facility] [place] NAME: Christian [name]    MR#:  [id number]  DATE OF BIRTH:  10/13/1940  SUBJECTIVE:  CHIEF COMPLAINT:   Chief Complaint  Patient presents with  . Chest Pain   No cough or shortness of breath or fever.  No abdominal pain. T-max 99.2 earlier today.  Patient wants to get up and walk.  REVIEW OF SYSTEMS:    Review of Systems  Constitutional: Positive for chills, diaphoresis, fever and malaise/fatigue. Negative for weight loss.  HENT: Negative for hearing loss and nosebleeds.   Eyes: Negative for blurred vision, double vision and pain.  Respiratory: Negative for cough, hemoptysis, sputum production, shortness of breath and wheezing.   Cardiovascular: Negative for chest pain, palpitations, orthopnea and leg swelling.  Gastrointestinal: Negative for abdominal pain, constipation, diarrhea, nausea and vomiting.  Genitourinary: Negative for dysuria and hematuria.  Musculoskeletal: Negative for back pain, falls and myalgias.  Skin: Negative for rash.  Neurological: Negative for dizziness, tremors, sensory change, speech change, focal weakness, seizures and headaches.  Endo/Heme/Allergies: Does not bruise/bleed easily.  Psychiatric/Behavioral: Negative for depression and memory loss. The patient is not nervous/anxious.     DRUG ALLERGIES:   Allergies  Allergen Reactions  . Ceftriaxone Hives  . Sulfasalazine Hives  . Sulfa Antibiotics Hives    VITALS:  Blood pressure 132/69, pulse 72, temperature 99.5 F (37.5 C), temperature source Oral, resp. rate 16, height 5\' 11"  (1.803 m), weight 129.3 kg (285 lb), SpO2 94 %.  PHYSICAL EXAMINATION:   Physical Exam  GENERAL:  77 y.o.-year-old patient lying in the bed with no acute distress.  EYES: Pupils equal, round, reactive to light and accommodation. No scleral icterus. Extraocular muscles intact.  HEENT: Head atraumatic, normocephalic. Oropharynx and nasopharynx clear.  NECK:   Supple, no jugular venous distention. No thyroid enlargement, no tenderness.  LUNGS: Normal breath sounds bilaterally, no wheezing, rales, rhonchi. No use of accessory muscles of respiration.  CARDIOVASCULAR: S1, S2 normal. No murmurs, rubs, or gallops.  ABDOMEN: Soft, nontender, nondistended. Bowel sounds present. No organomegaly or mass.  EXTREMITIES: No cyanosis, clubbing or edema b/l.    NEUROLOGIC: Cranial nerves II through XII are intact. No focal Motor or sensory deficits b/l.   PSYCHIATRIC: The patient is alert and oriented x 3.  SKIN: No obvious rash, lesion, or ulcer.   LABORATORY PANEL:   CBC Recent Labs  Lab 11/18/17 0733  WBC 9.5  HGB 12.2*  HCT 35.3*  PLT 190   ------------------------------------------------------------------------------------------------------------------ Chemistries  Recent Labs  Lab 11/17/17 1645  11/18/17 0733  NA  --   --  135  K  --   --  3.3*  CL  --   --  105  CO2  --   --  22  GLUCOSE  --   --  110*  BUN  --   --  11  CREATININE  --    < > 0.90  CALCIUM  --   --  7.8*  AST 61*  --   --   ALT 59  --   --   ALKPHOS 55  --   --   BILITOT 1.0  --   --    < > = values in this interval not displayed.   ------------------------------------------------------------------------------------------------------------------  Cardiac Enzymes Recent Labs  Lab 11/17/17 1511  TROPONINI <0.03   ------------------------------------------------------------------------------------------------------------------  RADIOLOGY:  Dg Chest 2 View  Result Date: 11/17/2017 CLINICAL DATA:  Chest pain, nausea EXAM:  CHEST - 2 VIEW COMPARISON:  11/09/2017 FINDINGS: Left base atelectasis. Right lung clear. Heart is normal size. No effusions or acute bony abnormality. IMPRESSION: Left base atelectasis.  No active disease. Electronically Signed   By: Rolm Baptise M.D.   On: 11/17/2017 15:40   Ct Angio Chest Pe W And/or Wo Contrast  Result Date:  11/17/2017 CLINICAL DATA:  Chest pain and palpitations. EXAM: CT ANGIOGRAPHY CHEST WITH CONTRAST TECHNIQUE: Multidetector CT imaging of the chest was performed using the standard protocol during bolus administration of intravenous contrast. Multiplanar CT image reconstructions and MIPs were obtained to evaluate the vascular anatomy. CONTRAST:  51mL ISOVUE-370 IOPAMIDOL (ISOVUE-370) INJECTION 76% COMPARISON:  Chest x-ray from same day. FINDINGS: Cardiovascular: Satisfactory opacification of the pulmonary arteries to the segmental level. No evidence of pulmonary embolism. Normal heart size. No pericardial effusion. Normal caliber thoracic aorta. Coronary, aortic arch, and branch vessel atherosclerotic vascular disease. Mediastinum/Nodes: No enlarged mediastinal, hilar, or axillary lymph nodes. Thyroid gland, trachea, and esophagus demonstrate no significant findings. Lungs/Pleura: Mild upper lobe predominant centrilobular emphysema. Mild central peribronchial thickening, particularly in the lower lobes. There are a few 3 mm pulmonary nodules in the right upper lobe. There is a 6 x 5 mm subpleural nodule in the superior segment of the left lower lobe (series 6, image 22). 6 mm triangular nodule in the left upper lobe may represent an intrapulmonary lymph node (series 6, image 34). Bibasilar atelectasis. No focal consolidation, pleural effusion, or pneumothorax. Upper Abdomen: No acute abnormality.  Hepatic steatosis. Musculoskeletal: No acute or significant osseous findings. Degenerative changes of the thoracic spine. Review of the MIP images confirms the above findings. IMPRESSION: 1. No evidence of pulmonary embolism. No acute intrathoracic process. 2. 6 mm subpleural nodule in the superior segment of the left lower lobe, possibly reflecting pleuroparenchymal scar. Non-contrast chest CT at 6-12 months is recommended. If the nodule is stable at time of repeat CT, then future CT at 18-24 months (from today's scan) is  considered optional for low-risk patients, but is recommended for high-risk patients. This recommendation follows the consensus statement: Guidelines for Management of Incidental Pulmonary Nodules Detected on CT Images: From the Fleischner Society 2017; Radiology 2017; 284:228-243. 3.  Emphysema (ICD10-J43.9). 4.  Aortic atherosclerosis (ICD10-I70.0). 5. Hepatic steatosis. Electronically Signed   By: Titus Dubin M.D.   On: 11/17/2017 17:17     ASSESSMENT AND PLAN:   *E. coli bacteremia likely secondary to UTI.  Waiting for final sensitivities. Continue IV antibiotics till patient is afebrile. Will need 14 days of antibiotic treatment.  Oral at discharge  *Sepsis present on admission has resolved.  Stopped IV fluids.  *CAD stable.  *Hypertension.  Continue home medications.  All the records are reviewed and case discussed with Care Management/Social Worker Management plans discussed with the patient, family and they are in agreement.  CODE STATUS: Full code  DVT Prophylaxis: SCDs  TOTAL TIME TAKING CARE OF THIS PATIENT: 35 minutes.   POSSIBLE D/C IN 1-2 DAYS, DEPENDING ON CLINICAL CONDITION.  Leia Alf Maiyah Goyne M.D on 11/19/2017 at 9:40 AM  Between 7am to 6pm - Pager - [phone]  After 6pm go to [email or web address] - password [email or web address] [facility]  Office  [phone]  CC: Primary care physician; [name], MD  Note: This dictation was prepared with Dragon dictation along with smaller phrase technology. Any transcriptional errors that result from this process are unintentional.

## 2017-11-20 LAB — CULTURE, BLOOD (ROUTINE X 2)

## 2017-11-20 LAB — URINE CULTURE: Culture: >=100000 — AB

## 2017-11-20 MED ORDER — CEPHALEXIN 500 MG PO CAPS
500.0000 mg | ORAL_CAPSULE | Freq: Once | ORAL | Status: AC
  Administered 2017-11-20: 11:00:00 500 mg via ORAL
  Filled 2017-11-20: qty 1

## 2017-11-20 MED ORDER — CEPHALEXIN 500 MG PO CAPS: 500.0000 mg | ORAL_CAPSULE | Freq: Three times a day (TID) | ORAL | 0 refills | Status: AC

## 2017-11-20 NOTE — Care Management Important Message (Signed)
Important Message  Patient Details  Name: Christian [name] MRN: [id number] Date of Birth: 02/22/1941   Medicare Important Message Given:  Yes    Juliann Pulse A Emmarae Cowdery 11/20/2017, 11:18 AM

## 2017-11-20 NOTE — Discharge Instructions (Signed)
Resume diet and activity as before ° ° °

## 2017-11-20 NOTE — Progress Notes (Signed)
Pt has been discharged home. Discharge papers given and explained to pt. Pt verbalized understanding. Meds and f/u appointments reviewed. RX to be picked up from pharmacy.

## 2017-11-20 NOTE — Progress Notes (Signed)
Patient has ceftriaxone as an allergy.  He seems to have tolerated Keflex in the past.  No allergy to penicillins.  Patient was given 1 dose of Keflex in the hospital orally and monitored.  No allergic reactions.  Patient will be discharged home on Keflex.

## 2017-11-21 NOTE — Discharge Summary (Signed)
[facility] [place] NAME: Christian [name]    MR#:  [id number]  DATE OF BIRTH:  12/10/1940  DATE OF ADMISSION:  11/17/2017 ADMITTING PHYSICIAN: Saundra Shelling, MD  DATE OF DISCHARGE: 11/20/2017  2:00 PM  PRIMARY CARE PHYSICIAN: [name], MD   ADMISSION DIAGNOSIS:  Hypoxia [R09.02] Urinary tract infection with hematuria, site unspecified [N39.0, R31.9]  DISCHARGE DIAGNOSIS:  Active Problems:   Sepsis ([place])   SECONDARY DIAGNOSIS:   Past Medical History:  Diagnosis Date  . Aortic aneurysm ([place])    followed by vascular surgery yearly.  . Bladder cancer [facility])    s/p bladder resection. [place]  . Coronary artery disease    AMI anterior wall 1997; s/p PTCA to LAD 11/1995 [facility]  Marland Kitchen DVT (deep venous thrombosis) ([place]) 10/07/2007  . Glucose intolerance (impaired glucose tolerance)   . Hydrocele   . Hyperlipidemia   . Hypertension   . MI (myocardial infarction) ([place]) 1997  . Nephrolithiasis   . Sleep apnea      ADMITTING HISTORY  HISTORY OF PRESENT ILLNESS: Christian [name]  is a 77 y.o. male with a known history of bladder cancer, hyperlipidemia, hypertension, nephrolithiasis, sleep apnea presented to the emergency room with cough, chills, fever and chest discomfort on deep breathing.  Patient was recently discharged from our hospital after being treated for flu and E. coli urinary tract infection.  He finished a course of antibiotics and Tamiflu tablets.  He presented today to the emergency room with low-grade fever, chills and body aches.  He also had some cough.  Patient was evaluated in the emergency room his lactic acid level was elevated.  He was started on IV antibiotics and was given IV fluids and cultures were sent.  EKG was reviewed by cardiology no ST segment changes.  No history of fall and head injury.  Patient was worked up with CTA chest which showed no pulmonary embolism. Hospitalist service was consulted for further care.  HOSPITAL  COURSE:   * E. coli bacteremia secondary to UTI.  Patient was continued on IV antibiotics.  By the day of discharge she is afebrile.  Normal WBC.  No abdominal pain or dysuria.  Feels back to normal. Switch to oral Keflex at discharge. Patient has allergy to IV ceftriaxone but has tolerated Keflex well without any allergic reactions with this dose given in the hospital. 12 days of oral antibiotics.  * Sepsis present on admission has resolved.  Stopped IV fluids.  * CAD stable.  * Hypertension.  Continue home medications.  Stable for discharge home.  CONSULTS OBTAINED:    DRUG ALLERGIES:   Allergies  Allergen Reactions  . Ceftriaxone Hives  . Sulfasalazine Hives  . Sulfa Antibiotics Hives    DISCHARGE MEDICATIONS:   Allergies as of 11/20/2017      Reactions   Ceftriaxone Hives   Sulfasalazine Hives   Sulfa Antibiotics Hives      Medication List    STOP taking these medications   nitrofurantoin (macrocrystal-monohydrate) 100 MG capsule Commonly known as:  MACROBID   oseltamivir 75 MG capsule Commonly known as:  TAMIFLU   predniSONE 20 MG tablet Commonly known as:  DELTASONE     TAKE these medications   albuterol 108 (90 Base) MCG/ACT inhaler Commonly known as:  PROVENTIL HFA;VENTOLIN HFA Inhale 2 puffs into the lungs every 4 (four) hours as needed for wheezing or shortness of breath (cough, shortness of breath or wheezing.).   ASPIR-LOW  81 MG EC tablet Generic drug:  aspirin Take 81 mg by mouth daily.   atorvastatin 10 MG tablet Commonly known as:  LIPITOR Take 1 tablet (10 mg total) by mouth daily at 6 PM.   cephALEXin 500 MG capsule Commonly known as:  KEFLEX Take 1 capsule (500 mg total) by mouth 3 (three) times daily for 12 days.   chlorpheniramine-HYDROcodone 10-8 MG/5ML Suer Commonly known as:  TUSSIONEX PENNKINETIC ER Take 5 mLs by mouth 2 (two) times daily.   clopidogrel 75 MG tablet Commonly known as:  PLAVIX Take 1 tablet (75 mg  total) by mouth daily.   guaiFENesin-codeine 100-10 MG/5ML syrup Take 5-10 mLs by mouth every 6 (six) hours as needed for cough.   nitroGLYCERIN 0.4 MG SL tablet Commonly known as:  NITROSTAT Place 1 tablet (0.4 mg total) under the tongue every 5 (five) minutes as needed for chest pain.   ondansetron 4 MG disintegrating tablet Commonly known as:  ZOFRAN ODT Take 1 tablet (4 mg total) by mouth every 8 (eight) hours as needed for nausea or vomiting.   RA FISH OIL 1000 MG Caps Take 1,000 mg by mouth 2 (two) times daily.   ramipril 10 MG capsule Commonly known as:  ALTACE Take 1 capsule (10 mg total) by mouth daily.       Today   VITAL SIGNS:  Blood pressure (!) 135/58, pulse 65, temperature 100.1 F (37.8 C), temperature source Oral, resp. rate 18, height 5\' 11"  (1.803 m), weight 129.3 kg (285 lb), SpO2 94 %.  I/O:  No intake or output data in the 24 hours ending 11/21/17 1841  PHYSICAL EXAMINATION:  Physical Exam  GENERAL:  77 y.o.-year-old patient lying in the bed with no acute distress.  LUNGS: Normal breath sounds bilaterally, no wheezing, rales,rhonchi or crepitation. No use of accessory muscles of respiration.  CARDIOVASCULAR: S1, S2 normal. No murmurs, rubs, or gallops.  ABDOMEN: Soft, non-tender, non-distended. Bowel sounds present. No organomegaly or mass.  NEUROLOGIC: Moves all 4 extremities. PSYCHIATRIC: The patient is alert and oriented x 3.  SKIN: No obvious rash, lesion, or ulcer.   DATA REVIEW:   CBC Recent Labs  Lab 11/18/17 0733  WBC 9.5  HGB 12.2*  HCT 35.3*  PLT 190    Chemistries  Recent Labs  Lab 11/17/17 1645  11/18/17 0733  NA  --   --  135  K  --   --  3.3*  CL  --   --  105  CO2  --   --  22  GLUCOSE  --   --  110*  BUN  --   --  11  CREATININE  --    < > 0.90  CALCIUM  --   --  7.8*  AST 61*  --   --   ALT 59  --   --   ALKPHOS 55  --   --   BILITOT 1.0  --   --    < > = values in this interval not displayed.    Cardiac  Enzymes Recent Labs  Lab 11/17/17 1511  TROPONINI <0.03    Microbiology Results  Results for orders placed or performed during the hospital encounter of 11/17/17  Urine culture     Status: Abnormal   Collection Time: 11/17/17  4:45 PM  Result Value Ref Range Status   Specimen Description   Final    URINE, RANDOM Performed at [facility], [address] [place],  [place] [address]    Special Requests   Final    NONE Performed at [facility], [address] [place], [place] [address]    Culture >=100,000 COLONIES/mL ESCHERICHIA COLI (A)  Final   Report Status 11/20/2017 FINAL  Final   Organism ID, Bacteria ESCHERICHIA COLI (A)  Final      Susceptibility   Escherichia coli - MIC*    AMPICILLIN >=32 RESISTANT Resistant     CEFAZOLIN <=4 SENSITIVE Sensitive     CEFTRIAXONE <=1 SENSITIVE Sensitive     CIPROFLOXACIN >=4 RESISTANT Resistant     GENTAMICIN <=1 SENSITIVE Sensitive     IMIPENEM <=0.25 SENSITIVE Sensitive     NITROFURANTOIN 256 RESISTANT Resistant     TRIMETH/SULFA <=20 SENSITIVE Sensitive     AMPICILLIN/SULBACTAM 16 INTERMEDIATE Intermediate     PIP/TAZO <=4 SENSITIVE Sensitive     Extended ESBL NEGATIVE Sensitive     * >=100,000 COLONIES/mL ESCHERICHIA COLI  Blood culture (routine x 2)     Status: None (Preliminary result)   Collection Time: 11/17/17  4:46 PM  Result Value Ref Range Status   Specimen Description BLOOD RAC  Final   Special Requests   Final    BOTTLES DRAWN AEROBIC AND ANAEROBIC Blood Culture results may not be optimal due to an excessive volume of blood received in culture bottles   Culture   Final    NO GROWTH 4 DAYS Performed at [facility], [address], [place], [place] [address]    Report Status PENDING  Incomplete  Blood culture (routine x 2)     Status: Abnormal   Collection Time: 11/17/17  4:46 PM  Result Value Ref Range Status   Specimen Description   Final    BLOOD RIGHT ARM Performed at [facility], [address] [place], [place] [address]    Special Requests   Final    BOTTLES DRAWN AEROBIC AND ANAEROBIC Blood Culture results may not be optimal due to an excessive volume of blood received in culture bottles Performed at [facility], [address] [place], [place] [address]    Culture  Setup Time   Final    GRAM NEGATIVE RODS IN BOTH AEROBIC AND ANAEROBIC BOTTLES CRITICAL RESULT CALLED TO, READ BACK BY AND VERIFIED WITH:  KAREN HAYES AT 9518 11/18/17 SDR Performed at [facility], [address] [place],  [address]    Culture ESCHERICHIA COLI (A)  Final   Report Status 11/20/2017 FINAL  Final   Organism ID, Bacteria ESCHERICHIA COLI  Final      Susceptibility   Escherichia coli - MIC*    AMPICILLIN >=32 RESISTANT Resistant     CEFAZOLIN <=4 SENSITIVE Sensitive     CEFEPIME <=1 SENSITIVE Sensitive     CEFTAZIDIME <=1 SENSITIVE Sensitive     CEFTRIAXONE <=1 SENSITIVE Sensitive     CIPROFLOXACIN >=4 RESISTANT Resistant     GENTAMICIN <=1 SENSITIVE Sensitive     IMIPENEM <=0.25 SENSITIVE Sensitive     TRIMETH/SULFA <=20 SENSITIVE Sensitive     AMPICILLIN/SULBACTAM 16 INTERMEDIATE Intermediate     PIP/TAZO <=4 SENSITIVE Sensitive     Extended ESBL NEGATIVE Sensitive     * ESCHERICHIA COLI  Blood Culture ID Panel (Reflexed)     Status: Abnormal   Collection Time: 11/17/17  4:46 PM  Result Value Ref Range Status   Enterococcus species NOT DETECTED NOT DETECTED Final   Listeria monocytogenes NOT DETECTED NOT DETECTED Final  Staphylococcus species NOT DETECTED NOT DETECTED Final   Staphylococcus aureus NOT DETECTED NOT DETECTED Final   Streptococcus species NOT DETECTED NOT DETECTED Final   Streptococcus agalactiae NOT DETECTED NOT DETECTED Final   Streptococcus pneumoniae NOT DETECTED NOT DETECTED Final   Streptococcus pyogenes NOT DETECTED NOT DETECTED Final   Acinetobacter baumannii NOT DETECTED NOT DETECTED Final    Enterobacteriaceae species DETECTED (A) NOT DETECTED Final    Comment: Enterobacteriaceae represent a large family of gram-negative bacteria, not a single organism. CRITICAL RESULT CALLED TO, READ BACK BY AND VERIFIED WITH: KAREN HAYES AT 3220 11/18/17 SDR    Enterobacter cloacae complex NOT DETECTED NOT DETECTED Final   Escherichia coli DETECTED (A) NOT DETECTED Final    Comment: CRITICAL RESULT CALLED TO, READ BACK BY AND VERIFIED WITH: KAREN HAYES AT 0903 11/18/17 SDR    Klebsiella oxytoca NOT DETECTED NOT DETECTED Final   Klebsiella pneumoniae NOT DETECTED NOT DETECTED Final   Proteus species NOT DETECTED NOT DETECTED Final   Serratia marcescens NOT DETECTED NOT DETECTED Final   Carbapenem resistance NOT DETECTED NOT DETECTED Final   Haemophilus influenzae NOT DETECTED NOT DETECTED Final   Neisseria meningitidis NOT DETECTED NOT DETECTED Final   Pseudomonas aeruginosa NOT DETECTED NOT DETECTED Final   Candida albicans NOT DETECTED NOT DETECTED Final   Candida glabrata NOT DETECTED NOT DETECTED Final   Candida krusei NOT DETECTED NOT DETECTED Final   Candida parapsilosis NOT DETECTED NOT DETECTED Final   Candida tropicalis NOT DETECTED NOT DETECTED Final    Comment: Performed at [facility], [address] [place], [place] [address]  MRSA PCR Screening     Status: None   Collection Time: 11/18/17 10:37 AM  Result Value Ref Range Status   MRSA by PCR NEGATIVE NEGATIVE Final    Comment:        The GeneXpert MRSA Assay (FDA approved for NASAL specimens only), is one component of a comprehensive MRSA colonization surveillance program. It is not intended to diagnose MRSA infection nor to guide or monitor treatment for MRSA infections. Performed at [facility], [address], [place], [place] [address]     RADIOLOGY:  No results found.  Follow up with PCP in 1 week.  Management plans discussed with the patient, family and they are in  agreement.  CODE STATUS:  Code Status History    Date Active Date Inactive Code Status Order ID Comments User Context   11/17/2017 2110 11/20/2017 1800 Full Code [id number]  Saundra Shelling, MD Inpatient    Advance Directive Documentation     Most Recent Value  Type of Advance Directive  Living will  Pre-existing out of facility DNR order (yellow form or pink MOST form)  -  "MOST" Form in Place?  -      TOTAL TIME TAKING CARE OF THIS PATIENT ON DAY OF DISCHARGE: more than 30 minutes.   Leia Alf Kyomi Hector M.D on 11/21/2017 at 6:41 PM  Between 7am to 6pm - Pager - [phone]  After 6pm go to [email or web address] - password [email or web address] [facility]  Office  [phone]  CC: Primary care physician; [name], MD  Note: This dictation was prepared with Dragon dictation along with smaller phrase technology. Any transcriptional errors that result from this process are unintentional.

## 2017-11-22 LAB — CULTURE, BLOOD (ROUTINE X 2): Culture: NO GROWTH

## 2017-11-27 ENCOUNTER — Telehealth: Payer: Self-pay | Admitting: Licensed Clinical Social Worker

## 2017-11-27 NOTE — Telephone Encounter (Signed)
EMMI flagged patient for answering yes to loss of interest in things. Clinical Education officer, museum (CSW) was able to reach patient via telephone today. Patient reported that he is feeling fine and doing well. Patient reported that he goes to see Dr. Tamala Julian his PCP on Saturday. Patient reported that he is not feeling sad or depressed. Patient reported no needs or concerns. No future call is needed.   [employer], LCSW [phone]

## 2017-11-29 ENCOUNTER — Encounter: Payer: Self-pay | Admitting: Family Medicine

## 2017-11-29 ENCOUNTER — Ambulatory Visit: Payer: Medicare HMO | Admitting: Family Medicine

## 2017-11-29 ENCOUNTER — Other Ambulatory Visit: Payer: Self-pay

## 2017-11-29 VITALS — BP 126/76 | HR 87 | Temp 98.8°F | Resp 16 | Ht 71.0 in | Wt 283.0 lb

## 2017-11-29 DIAGNOSIS — J111 Influenza due to unidentified influenza virus with other respiratory manifestations: Secondary | ICD-10-CM

## 2017-11-29 DIAGNOSIS — I25118 Atherosclerotic heart disease of native coronary artery with other forms of angina pectoris: Secondary | ICD-10-CM

## 2017-11-29 DIAGNOSIS — Z8551 Personal history of malignant neoplasm of bladder: Secondary | ICD-10-CM

## 2017-11-29 DIAGNOSIS — A4151 Sepsis due to Escherichia coli [E. coli]: Secondary | ICD-10-CM | POA: Diagnosis not present

## 2017-11-29 DIAGNOSIS — M79662 Pain in left lower leg: Secondary | ICD-10-CM

## 2017-11-29 DIAGNOSIS — I714 Abdominal aortic aneurysm, without rupture, unspecified: Secondary | ICD-10-CM

## 2017-11-29 DIAGNOSIS — N10 Acute tubulo-interstitial nephritis: Secondary | ICD-10-CM | POA: Diagnosis not present

## 2017-11-29 LAB — POCT URINALYSIS DIP (MANUAL ENTRY)
BILIRUBIN UA: NEGATIVE
BILIRUBIN UA: NEGATIVE mg/dL
Glucose, UA: NEGATIVE mg/dL
LEUKOCYTES UA: NEGATIVE
NITRITE UA: NEGATIVE
PH UA: 5.5 (ref 5.0–8.0)
PROTEIN UA: NEGATIVE mg/dL
Spec Grav, UA: 1.02 (ref 1.010–1.025)
Urobilinogen, UA: 0.2 E.U./dL

## 2017-11-29 LAB — POC MICROSCOPIC URINALYSIS (UMFC): Mucus: ABSENT

## 2017-11-29 NOTE — Patient Instructions (Signed)
     IF you received an x-ray today, you will receive an invoice from [facility]. Please contact [facility] at [phone] with questions or concerns regarding your invoice.   IF you received labwork today, you will receive an invoice from [facility]. Please contact [facility] at [phone] with questions or concerns regarding your invoice.   Our billing staff will not be able to assist you with questions regarding bills from these companies.  You will be contacted with the lab results as soon as they are available. The fastest way to get your results is to activate your My Chart account. Instructions are located on the last page of this paperwork. If you have not heard from us regarding the results in 2 weeks, please contact this office.     

## 2017-11-29 NOTE — Progress Notes (Signed)
Subjective:    Patient ID: Christian [name], male    DOB: 1941/05/10, 77 y.o.   MRN: [id number]  11/29/2017  Hospitalization Follow-up (pt states he is not feeling much better)    HPI This 77 y.o. male presents for TRANSITION OF CARE VISIT FOR hypoxia, urinary tract infection, influenza infection.   DATE OF ADMISSION:  11/17/2017    ADMITTING PHYSICIAN: Saundra Shelling, MD DATE OF DISCHARGE: 11/20/2017  2:00 PM PRIMARY CARE PHYSICIAN: [name], MD  ADMISSION DIAGNOSIS:  Hypoxia [R09.02] Urinary tract infection with hematuria, site unspecified [N39.0, R31.9] DISCHARGE DIAGNOSIS:  Active Problems:   Sepsis ([place]) ADMITTING HISTORY HISTORY OF PRESENT ILLNESS:Christian [name] a76 y.o.malewith a known history of bladder cancer, hyperlipidemia, hypertension, nephrolithiasis, sleep apnea presented to the emergency room with cough, chills, fever and chest discomfort on deep breathing.Patient was recently discharged from our hospital after being treated for flu and E. coli urinary tract infection.He finished a course of antibiotics and Tamiflu tablets.He presented today to the emergency room with low-grade fever, chills and body aches.He also had some cough.Patient was evaluated in the emergency room his lactic acid level was elevated.He was started on IV antibiotics and was given IV fluids and cultures were sent.EKG was reviewed by cardiology no ST segment changes.No history of fall and head injury.Patient was worked up with CTA chest which showed no pulmonary embolism. Hospitalist service was consulted for further care.  HOSPITAL COURSE:   1.E. coli bacteremia secondary to UTI. Patient was continued on IV antibiotics.  By the day of discharge she is afebrile.  Normal WBC.  No abdominal pain or dysuria.  Feels back to normal. Switch to oral Keflex at discharge. Patient has allergy to IV ceftriaxone but has tolerated Keflex well without any allergic reactions with this  dose given in the hospital. 12 days of oral antibiotics. 2. Sepsis present on admission has resolved. StoppedIV fluids. 3.  CAD stable. 4.  History of DVT in that left leg following bladder resection hypertension. Continue home medications.  Stable for discharge home.  CONSULTS OBTAINED:   Medication List    STOP taking these medications   nitrofurantoin (macrocrystal-monohydrate) 100 MG capsule Commonly known as:  MACROBID   oseltamivir 75 MG capsule Commonly known as:  TAMIFLU   predniSONE 20 MG tablet Commonly known as:  DELTASONE     TAKE these medications   albuterol 108 (90 Base) MCG/ACT inhaler Commonly known as:  PROVENTIL HFA;VENTOLIN HFA Inhale 2 puffs into the lungs every 4 (four) hours as needed for wheezing or shortness of breath (cough, shortness of breath or wheezing.).   ASPIR-LOW 81 MG EC tablet Generic drug:  aspirin Take 81 mg by mouth daily.   atorvastatin 10 MG tablet Commonly known as:  LIPITOR Take 1 tablet (10 mg total) by mouth daily at 6 PM.   cephALEXin 500 MG capsule Commonly known as:  KEFLEX Take 1 capsule (500 mg total) by mouth 3 (three) times daily for 12 days.   chlorpheniramine-HYDROcodone 10-8 MG/5ML Suer Commonly known as:  TUSSIONEX PENNKINETIC ER Take 5 mLs by mouth 2 (two) times daily.   clopidogrel 75 MG tablet Commonly known as:  PLAVIX Take 1 tablet (75 mg total) by mouth daily.   guaiFENesin-codeine 100-10 MG/5ML syrup Take 5-10 mLs by mouth every 6 (six) hours as needed for cough.   nitroGLYCERIN 0.4 MG SL tablet Commonly known as:  NITROSTAT Place 1 tablet (0.4 mg total) under the tongue every 5 (five) minutes as needed for  chest pain.   ondansetron 4 MG disintegrating tablet Commonly known as:  ZOFRAN ODT Take 1 tablet (4 mg total) by mouth every 8 (eight) hours as needed for nausea or vomiting.   RA FISH OIL 1000 MG Caps Take 1,000 mg by mouth 2 (two) times daily.   ramipril 10 MG  capsule Commonly known as:  ALTACE Take 1 capsule (10 mg total) by mouth daily.     [place] DISCHARGE: Five days after starting Tamiflu and Macrobid, started developing chills and vomiting worsened.  Went back to ED, obtained urine culture and blood cultures.  Cough was improving.   Realized that sicker than initial presentation. CXR revealed L base atelectasis versus infiltrate.  Started abx therapy. Discharged on Keflex 500mg  tid.  No strength what so every.  Gets dizzy if bends over. L leg feels like cramp. No swelling in leg.  Onset of L leg cramping 3 days ago.  Not a cramp.   No nausea but when eats something like chocolate, develops gas. Having more LEFT leg swelling since hospital discharge.  History of DVT in left leg after bladder resection for bladder cancer.  Suffers with chronic swelling in that leg.  Denies chest pain, shortness of breath, persistent cough, tachycardia   BP Readings from Last 3 Encounters:  12/30/17 (!) 153/78  12/22/17 122/70  11/29/17 126/76   Wt Readings from Last 3 Encounters:  12/30/17 291 lb 8 oz (132.2 kg)  12/22/17 289 lb (131.1 kg)  11/29/17 283 lb (128.4 kg)   Immunization History  Administered Date(s) Administered  . Influenza,inj,Quad PF,6+ Mos 06/02/2013, 10/17/2015, 04/16/2016, 07/07/2017  . Pneumococcal Conjugate-13 07/31/2016  . Pneumococcal Polysaccharide-23 03/09/2008    Review of Systems  Constitutional: Positive for fatigue. Negative for activity change, appetite change, chills, diaphoresis, fever and unexpected weight change.  HENT: Negative for congestion, dental problem, drooling, ear discharge, ear pain, facial swelling, hearing loss, mouth sores, nosebleeds, postnasal drip, rhinorrhea, sinus pressure, sneezing, sore throat, tinnitus, trouble swallowing and voice change.   Eyes: Negative for photophobia, pain, discharge, redness, itching and visual disturbance.  Respiratory: Negative for apnea, cough,  choking, chest tightness, shortness of breath, wheezing and stridor.   Cardiovascular: Positive for leg swelling. Negative for chest pain and palpitations.  Gastrointestinal: Positive for nausea. Negative for abdominal pain, blood in stool, constipation, diarrhea and vomiting.  Endocrine: Negative for cold intolerance, heat intolerance, polydipsia, polyphagia and polyuria.  Genitourinary: Negative for decreased urine volume, difficulty urinating, discharge, dysuria, enuresis, flank pain, frequency, genital sores, hematuria, penile pain, penile swelling, scrotal swelling, testicular pain and urgency.  Musculoskeletal: Negative for arthralgias, back pain, gait problem, joint swelling, myalgias, neck pain and neck stiffness.  Skin: Negative for color change, pallor, rash and wound.  Allergic/Immunologic: Negative for environmental allergies, food allergies and immunocompromised state.  Neurological: Positive for light-headedness. Negative for dizziness, tremors, seizures, syncope, facial asymmetry, speech difficulty, weakness, numbness and headaches.  Hematological: Negative for adenopathy. Does not bruise/bleed easily.  Psychiatric/Behavioral: Negative for agitation, behavioral problems, confusion, decreased concentration, dysphoric mood, hallucinations, self-injury, sleep disturbance and suicidal ideas. The patient is not nervous/anxious and is not hyperactive.     Past Medical History:  Diagnosis Date  . Aortic aneurysm ([place])    followed by vascular surgery yearly.  . Bladder cancer [facility])    s/p bladder resection. [place]  . Coronary artery disease    AMI anterior wall 1997; s/p PTCA to LAD 11/1995 [facility].  Marland Kitchen DVT (deep venous thrombosis) ([place]) 10/07/2007  .  Glucose intolerance (impaired glucose tolerance)   . Hydrocele   . Hyperlipidemia   . Hypertension   . MI (myocardial infarction) ([place]) 1997  . Nephrolithiasis   . Sleep apnea    Past Surgical History:  Procedure Laterality Date  .  [place]  . bladder removed    . CARDIAC CATHETERIZATION    . COLONOSCOPY  08/27/2011   normal.  [facility].  Marland Kitchen HYDROCELE EXCISION    . PROSTATE SURGERY     prostatectomy with bladder resection.   Allergies  Allergen Reactions  . Ceftriaxone Hives  . Sulfasalazine Hives  . Sulfa Antibiotics Hives   Current Outpatient Medications on File Prior to Visit  Medication Sig Dispense Refill  . atorvastatin (LIPITOR) 10 MG tablet Take 1 tablet (10 mg total) by mouth daily at 6 PM. 90 tablet 3  . nitroGLYCERIN (NITROSTAT) 0.4 MG SL tablet Place 1 tablet (0.4 mg total) under the tongue every 5 (five) minutes as needed for chest pain. 25 tablet 6  . Omega-3 Fatty Acids (RA FISH OIL) 1000 MG CAPS Take 1,000 mg by mouth 2 (two) times daily.     . ramipril (ALTACE) 10 MG capsule Take 1 capsule (10 mg total) by mouth daily. 90 capsule 3   No current facility-administered medications on file prior to visit.    Social History   Socioeconomic History  . Marital status: Married    Spouse name: Not on file  . Number of children: Not on file  . Years of education: Not on file  . Highest education level: Not on file  Occupational History  . Occupation: retired  Scientific laboratory technician  . Financial resource strain: Patient refused  . Food insecurity:    Worry: Patient refused    Inability: Patient refused  . Transportation needs:    Medical: Patient refused    Non-medical: Patient refused  Tobacco Use  . Smoking status: Never Smoker  . Smokeless tobacco: Never Used  . Tobacco comment: tobacco use- no   Substance and Sexual Activity  . Alcohol use: No  . Drug use: No  . Sexual activity: Yes  Lifestyle  . Physical activity:    Days per week: Patient refused    Minutes per session: Patient refused  . Stress: Patient refused  Relationships  . Social connections:    Talks on phone: Patient refused    Gets together: Patient refused    Attends religious service:  Patient refused    Active member of club or organization: Patient refused    Attends meetings of clubs or organizations: Patient refused    Relationship status: Patient refused  . Intimate partner violence:    Fear of current or ex partner: Patient refused    Emotionally abused: Patient refused    Physically abused: Patient refused    Forced sexual activity: Patient refused  Other Topics Concern  . Not on file  Social History Narrative   Marital status:  Married x 28 years; happily married; third marriage.      Children: none; 2 stepdaughters; 3 grandchildren.  3 gg.      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      Tobacco:  None      Alcohol:  None; socially      Drugs: none     Exercise: Regularly exercise/walking; walking daily.  No formal exercise plan in 2018.      ADLs: independent with ADLs.  Drives; no assistant device.      Advanced Directives:  Yes; FULL CODE.  No prolonged measures.      Seatbelt: 100%         Family History  Problem Relation Age of Onset  . Stroke Mother   . Diabetes Mother   . Lung cancer Father   . Cancer Father        lung cancer  . Diabetes Sister   . Diabetes Brother        Objective:    BP 126/76   Pulse 87   Temp 98.8 F (37.1 C) (Oral)   Resp 16   Ht 5\' 11"  (1.803 m)   Wt 283 lb (128.4 kg)   SpO2 99%   BMI 39.47 kg/m  Physical Exam  Constitutional: He is oriented to person, place, and time. He appears well-developed and well-nourished. No distress.  HENT:  Head: Normocephalic and atraumatic.  Right Ear: External ear normal.  Left Ear: External ear normal.  Nose: Nose normal.  Mouth/Throat: Oropharynx is clear and moist.  Eyes: Pupils are equal, round, and reactive to light. Conjunctivae and EOM are normal.  Neck: Normal range of motion. Neck supple. Carotid bruit is not present. No thyromegaly present.  Cardiovascular: Normal rate, regular rhythm, normal heart sounds and intact distal pulses. Exam  reveals no gallop and no friction rub.  No murmur heard. Minimal calf swelling in L calf.  hommen's negative.  Pulmonary/Chest: Effort normal and breath sounds normal. He has no wheezes. He has no rales.  Abdominal: Soft. Bowel sounds are normal. He exhibits no distension and no mass. There is no tenderness. There is no rebound and no guarding.  Genitourinary: Penis normal.  Musculoskeletal: He exhibits edema.       Right shoulder: Normal.       Left shoulder: Normal.       Cervical back: Normal.  Lymphadenopathy:    He has no cervical adenopathy.  Neurological: He is alert and oriented to person, place, and time. He has normal reflexes. No cranial nerve deficit. He exhibits normal muscle tone. Coordination normal.  Skin: Skin is warm and dry. No rash noted. He is not diaphoretic.  Psychiatric: He has a normal mood and affect. His behavior is normal. Judgment and thought content normal.   No results found. Depression screen [facility] 2/9 12/22/2017 11/29/2017 11/03/2017 07/07/2017 04/02/2017  Decreased Interest 0 0 0 0 0  Down, Depressed, Hopeless 0 0 0 0 0  PHQ - 2 Score 0 0 0 0 0   Fall Risk  12/22/2017 11/29/2017 11/03/2017 07/07/2017 04/02/2017  Falls in the past year? No No No No No        Assessment & Plan:   1. Acute pyelonephritis   2. Sepsis due to Escherichia coli (HCC)   3. Pain of left calf   4. Influenza with respiratory manifestation   5. Atherosclerosis of native coronary artery of native heart with stable angina pectoris ([place])   6. Abdominal aortic aneurysm (AAA) without rupture ([place])   7. History of bladder cancer     Recent admission for urosepsis in the setting of recent influenza infection.  Hospitalization records reviewed in detail.  Repeat urinalysis with urine culture today.  Energy level remains low secondary to recurrent hospitalizations and to significant illnesses.  Obtain lab work for reassurance.  Encourage patient to work on endurance and strength of the upcoming 2  to 4 weeks.  Left calf pain: New onset in the past  week associated with recent hospitalization for urosepsis.  History of DVT and left calf with bladder resection years ago.  We will schedule lower extremity Doppler to rule out DVT though exam benign.  Orders Placed This Encounter  Procedures  . Urine Culture    Order Specific Question:   Source    Answer:   urostomy  . CBC with Differential/Platelet  . Comprehensive metabolic panel  . POCT urinalysis dipstick  . POCT Microscopic Urinalysis ([facility])   No orders of the defined types were placed in this encounter.   Return in about 1 month (around 12/27/2017) for recheck.   Christian [name], M.D. Primary Care at [facility] previously [facility] [place] [address] [place], [place]  [address] [phone] phone [phone] fax

## 2017-11-30 LAB — URINE CULTURE: Organism ID, Bacteria: NO GROWTH

## 2017-12-01 ENCOUNTER — Telehealth: Payer: Self-pay | Admitting: Family Medicine

## 2017-12-01 LAB — CBC WITH DIFFERENTIAL/PLATELET
BASOS: 0 %
Basophils Absolute: 0 10*3/uL (ref 0.0–0.2)
EOS (ABSOLUTE): 0.2 10*3/uL (ref 0.0–0.4)
EOS: 2 %
Hematocrit: 40.7 % (ref 37.5–51.0)
Hemoglobin: 13.7 g/dL (ref 13.0–17.7)
IMMATURE GRANULOCYTES: 0 %
Immature Grans (Abs): 0 10*3/uL (ref 0.0–0.1)
Lymphocytes Absolute: 3 10*3/uL (ref 0.7–3.1)
Lymphs: 28 %
MCH: 33.7 pg — ABNORMAL HIGH (ref 26.6–33.0)
MCHC: 33.7 g/dL (ref 31.5–35.7)
MCV: 100 fL — AB (ref 79–97)
Monocytes Absolute: 0.9 10*3/uL (ref 0.1–0.9)
Monocytes: 8 %
NEUTROS PCT: 62 %
Neutrophils Absolute: 6.6 10*3/uL (ref 1.4–7.0)
PLATELETS: 456 10*3/uL — AB (ref 150–379)
RBC: 4.07 x10E6/uL — ABNORMAL LOW (ref 4.14–5.80)
RDW: 14.6 % (ref 12.3–15.4)
WBC: 10.7 10*3/uL (ref 3.4–10.8)

## 2017-12-01 LAB — COMPREHENSIVE METABOLIC PANEL
A/G RATIO: 1 — AB (ref 1.2–2.2)
ALT: 55 IU/L — ABNORMAL HIGH (ref 0–44)
AST: 45 IU/L — ABNORMAL HIGH (ref 0–40)
Albumin: 4 g/dL (ref 3.5–4.8)
Alkaline Phosphatase: 64 IU/L (ref 39–117)
BUN/Creatinine Ratio: 12 (ref 10–24)
BUN: 9 mg/dL (ref 8–27)
Bilirubin Total: 0.3 mg/dL (ref 0.0–1.2)
CALCIUM: 9.5 mg/dL (ref 8.6–10.2)
CO2: 23 mmol/L (ref 20–29)
CREATININE: 0.78 mg/dL (ref 0.76–1.27)
Chloride: 97 mmol/L (ref 96–106)
GFR, EST AFRICAN AMERICAN: 103 mL/min/{1.73_m2} (ref >59)
GFR, EST NON AFRICAN AMERICAN: 89 mL/min/{1.73_m2} (ref >59)
GLUCOSE: 90 mg/dL (ref 65–99)
Globulin, Total: 3.9 g/dL (ref 1.5–4.5)
Potassium: 5 mmol/L (ref 3.5–5.2)
Sodium: 136 mmol/L (ref 134–144)
TOTAL PROTEIN: 7.9 g/dL (ref 6.0–8.5)

## 2017-12-01 NOTE — Telephone Encounter (Signed)
Received call from [facility] stating that pt was sent to [facility] for Dr. Lucky Cowboy but he is booked out for 2 weeks. Their office stated if DVT is suspected, pt does not need to wait for 2 weeks and a STAT referral needs to be put in. I did not see an original referral for Dr. Lucky Cowboy. Please advise. Thanks!

## 2017-12-01 NOTE — Telephone Encounter (Signed)
Provider, please advise.  

## 2017-12-02 ENCOUNTER — Ambulatory Visit (INDEPENDENT_AMBULATORY_CARE_PROVIDER_SITE_OTHER): Payer: Medicare HMO

## 2017-12-02 ENCOUNTER — Other Ambulatory Visit: Payer: Self-pay | Admitting: Family Medicine

## 2017-12-02 DIAGNOSIS — Z86718 Personal history of other venous thrombosis and embolism: Secondary | ICD-10-CM | POA: Diagnosis not present

## 2017-12-02 DIAGNOSIS — M79662 Pain in left lower leg: Secondary | ICD-10-CM

## 2017-12-02 MED ORDER — APIXABAN 5 MG PO TABS: 5.0000 mg | ORAL_TABLET | Freq: Two times a day (BID) | ORAL | 11 refills | Status: DC

## 2017-12-02 NOTE — Patient Instructions (Addendum)
Medication Instructions:  Your physician has recommended you make the following change in your medication:  1. STOP Plavix 2. STOP Aspirin 3. START Eliquis 5 mg and take 2 tablets twice daily for 7 days then go to 1 tablet twice a day  Medication Samples have been provided to the patient.  Drug name: Eliquis       Strength: 5 mg        Qty: 4 boxes  LOT: [id number]  Exp.Date: 6/21   It was a pleasure seeing you today here in the office. Please do not hesitate to give [place] a call back if you have any further questions. [place], BSN

## 2017-12-06 ENCOUNTER — Telehealth: Payer: Self-pay | Admitting: Family Medicine

## 2017-12-06 NOTE — Telephone Encounter (Signed)
Pt. Calling requesting to be called about labs done on 11/29/17   Best Number [phone]

## 2017-12-08 NOTE — Telephone Encounter (Signed)
Copied from [place]. Topic: Quick Communication - Lab Results >> Dec 05, 2017  2:27 PM Scherrie Gerlach wrote: Wife is calling for the pt to get results of labs done 4/6. Pt does not understand the numbers and needs someone to go over with him.  Pt was told he would get a call back

## 2017-12-08 NOTE — Telephone Encounter (Signed)
Call patient --- lab results are all reassuring.  White blood cell count is normal.  No evidence of anemia.  Platelet count is elevated which is likely due to recent infection and newly diagnosed leg blood clot.  I recommend repeating this level at a visit in the upcoming two weeks.  Sugar is normal.  Kidney function is normal.  Two liver function studies are slightly elevated (AST, ALT); this can be due to cholesterol medication, recent medications received during hospitalization, recent illness.  I recommend repeating these levels at next visit in two weeks as well.  Urine culture also negative; he does NOT need additional antibiotics.

## 2017-12-08 NOTE — Telephone Encounter (Signed)
Release labs  

## 2017-12-08 NOTE — Telephone Encounter (Signed)
Phone call to patient. Relayed message from provider below. He is Patent attorney. Patient transferred to front desk to schedule 2 week follow up appointment.

## 2017-12-08 NOTE — Telephone Encounter (Signed)
Pt needs to have a call so that he can have an understanding of his labs, he saw his results and does not understand them. Pt has taken all the abx that he came home from hosp with and wants to know if he he needs more abx or if he is ok. Please call back at [phone]   Pt is worried that he may get sick again.

## 2017-12-08 NOTE — Telephone Encounter (Signed)
Call patient ---- I recommend follow-up with me in upcoming 2 weeks.  Also, I assume that Dr. Rockey Situ recommended follow-up with Dr. Lucky Cowboy due to recurrent DVT.  Please clarify if I need to place a referral for Dr. Lucky Cowboy.  Also please advise Dr. Bunnie Domino office and patient that patient is being treated for recurrent DVT, so an urgent referral is not warranted. Patient just needs an office visit with Dr. Lucky Cowboy in the upcoming 1-3 months to evaluate blood vessels in his LEFT leg because he has suffered 2 DVTs in the past 30 years (recent diagnosis of second DVT last week).

## 2017-12-09 NOTE — Telephone Encounter (Signed)
Spoke with pt. He said that Dr. Jacqlyn Larsen originally referred him to Dr. Lucky Cowboy. I spoke to Dr. Bunnie Domino office as well and they were unsure of why we were called stating pt needed urgent referral for appt when they did not even have a referral for pt. They stated pt's yearly follow up is scheduled with Dr. Lucky Cowboy, but he will need a new referral to see Dr. Lucky Cowboy for DVT. I spoke with pt and advised that I will put a message in to Dr. Tamala Julian to see if we can get another referral placed for Dr. Lucky Cowboy to see him for DVT. Pt is scheduled to see Dr. Tamala Julian on 12/22/17. Thanks!

## 2017-12-11 NOTE — Telephone Encounter (Signed)
Noted. MyChart message sent to patient

## 2017-12-12 NOTE — Telephone Encounter (Signed)
Appointment Scheduled with Dr. Tamala Julian

## 2017-12-14 NOTE — Progress Notes (Signed)
We can schedule a routine follow up He has new DVT, now on eliquis

## 2017-12-22 ENCOUNTER — Other Ambulatory Visit: Payer: Self-pay

## 2017-12-22 ENCOUNTER — Ambulatory Visit: Payer: Medicare HMO | Admitting: Family Medicine

## 2017-12-22 ENCOUNTER — Encounter: Payer: Self-pay | Admitting: Family Medicine

## 2017-12-22 VITALS — BP 122/70 | HR 93 | Temp 98.0°F | Resp 16 | Ht 69.69 in | Wt 289.0 lb

## 2017-12-22 DIAGNOSIS — I700 Atherosclerosis of aorta: Secondary | ICD-10-CM

## 2017-12-22 DIAGNOSIS — I82432 Acute embolism and thrombosis of left popliteal vein: Secondary | ICD-10-CM | POA: Diagnosis not present

## 2017-12-22 DIAGNOSIS — I714 Abdominal aortic aneurysm, without rupture, unspecified: Secondary | ICD-10-CM

## 2017-12-22 DIAGNOSIS — I25118 Atherosclerotic heart disease of native coronary artery with other forms of angina pectoris: Secondary | ICD-10-CM

## 2017-12-22 NOTE — Progress Notes (Signed)
Subjective:    Patient ID: Christian [name], male    DOB: 1941/01/12, 77 y.o.   MRN: [id number]  12/22/2017  Hospitalization Follow-up (pt states he has been feeling better, but he has had some dizziness since starting the Eliquis)    HPI This 77 y.o. male presents for evaluation of NEW ONSET DVT.  Started on Eliquis 5mg  twice daily.  When stands up quickly, gets dizzy.  Onset since starting it.  Not as severe as it was.  Was taking four a day with onset.  No longer having leg cramps; no swelling.  Dr. Rockey Situ recommended Eliquis.  Took two weeks to get better.   Strength is slowly improving. Eliquis is very expensive.  First DVT occurred after first cancer operation at [facility].  Admission.  Lovenox.  Spread to lungs/pulmonary embolism.   No chest pain or SOB with acute DVT.   Leg swelling resolved after two weeks.   Dr. Rockey Situ did not refer to Dr. Lucky Cowboy.  Upcoming appointment with [facility] 12/30/17. Has follow-up with Dr. Lucky Cowboy for AAA.   January 30, 2018. With recent acute illness with influenza and urinary tract infection with sepspis, prolonged immobilization.     BP Readings from Last 3 Encounters:  02/13/18 135/80  12/30/17 (!) 153/78  12/22/17 122/70   Wt Readings from Last 3 Encounters:  02/13/18 292 lb (132.5 kg)  12/30/17 291 lb 8 oz (132.2 kg)  12/22/17 289 lb (131.1 kg)   Immunization History  Administered Date(s) Administered  . Influenza,inj,Quad PF,6+ Mos 06/02/2013, 10/17/2015, 04/16/2016, 07/07/2017  . Pneumococcal Conjugate-13 07/31/2016  . Pneumococcal Polysaccharide-23 03/09/2008    Review of Systems  Constitutional: Positive for fatigue. Negative for activity change, appetite change, chills, diaphoresis, fever and unexpected weight change.  HENT: Negative for congestion, dental problem, drooling, ear discharge, ear pain, facial swelling, hearing loss, mouth sores, nosebleeds, postnasal drip, rhinorrhea, sinus pressure, sneezing, sore throat, tinnitus, trouble  swallowing and voice change.   Eyes: Negative for photophobia, pain, discharge, redness, itching and visual disturbance.  Respiratory: Negative for apnea, cough, choking, chest tightness, shortness of breath, wheezing and stridor.   Cardiovascular: Negative for chest pain, palpitations and leg swelling.  Gastrointestinal: Negative for abdominal pain, blood in stool, constipation, diarrhea, nausea and vomiting.  Endocrine: Negative for cold intolerance, heat intolerance, polydipsia, polyphagia and polyuria.  Genitourinary: Negative for decreased urine volume, difficulty urinating, discharge, dysuria, enuresis, flank pain, frequency, genital sores, hematuria, penile pain, penile swelling, scrotal swelling, testicular pain and urgency.  Musculoskeletal: Negative for arthralgias, back pain, gait problem, joint swelling, myalgias, neck pain and neck stiffness.  Skin: Negative for color change, pallor, rash and wound.  Allergic/Immunologic: Negative for environmental allergies, food allergies and immunocompromised state.  Neurological: Positive for dizziness. Negative for tremors, seizures, syncope, facial asymmetry, speech difficulty, weakness, light-headedness, numbness and headaches.  Hematological: Negative for adenopathy. Does not bruise/bleed easily.  Psychiatric/Behavioral: Negative for agitation, behavioral problems, confusion, decreased concentration, dysphoric mood, hallucinations, self-injury, sleep disturbance and suicidal ideas. The patient is not nervous/anxious and is not hyperactive.     Past Medical History:  Diagnosis Date  . Aortic aneurysm ([place])    followed by vascular surgery yearly.  . Bladder cancer [facility])    s/p bladder resection. [place]  . Coronary artery disease    AMI anterior wall 1997; s/p PTCA to LAD 11/1995 [facility].  Marland Kitchen DVT (deep venous thrombosis) ([place]) 10/07/2007  . Glucose intolerance (impaired glucose tolerance)   . Hydrocele   . Hyperlipidemia   .  Hypertension   . MI  (myocardial infarction) ([place]) 1997  . Nephrolithiasis   . Sleep apnea    Past Surgical History:  Procedure Laterality Date  . [place]  . bladder removed    . CARDIAC CATHETERIZATION    . COLONOSCOPY  08/27/2011   normal.  [facility].  Marland Kitchen HYDROCELE EXCISION    . PROSTATE SURGERY     prostatectomy with bladder resection.   Allergies  Allergen Reactions  . Ceftriaxone Hives  . Sulfasalazine Hives  . Sulfa Antibiotics Hives   Current Outpatient Medications on File Prior to Visit  Medication Sig Dispense Refill  . atorvastatin (LIPITOR) 10 MG tablet Take 1 tablet (10 mg total) by mouth daily at 6 PM. 90 tablet 3  . nitroGLYCERIN (NITROSTAT) 0.4 MG SL tablet Place 1 tablet (0.4 mg total) under the tongue every 5 (five) minutes as needed for chest pain. 25 tablet 6  . Omega-3 Fatty Acids (RA FISH OIL) 1000 MG CAPS Take 1,000 mg by mouth 2 (two) times daily.     . ramipril (ALTACE) 10 MG capsule Take 1 capsule (10 mg total) by mouth daily. 90 capsule 3   No current facility-administered medications on file prior to visit.    Social History   Socioeconomic History  . Marital status: Married    Spouse name: Not on file  . Number of children: Not on file  . Years of education: Not on file  . Highest education level: Not on file  Occupational History  . Occupation: retired  Scientific laboratory technician  . Financial resource strain: Patient refused  . Food insecurity:    Worry: Patient refused    Inability: Patient refused  . Transportation needs:    Medical: Patient refused    Non-medical: Patient refused  Tobacco Use  . Smoking status: Never Smoker  . Smokeless tobacco: Never Used  . Tobacco comment: tobacco use- no   Substance and Sexual Activity  . Alcohol use: No  . Drug use: No  . Sexual activity: Yes  Lifestyle  . Physical activity:    Days per week: Patient refused    Minutes per session: Patient refused  . Stress: Patient refused  Relationships    . Social connections:    Talks on phone: Patient refused    Gets together: Patient refused    Attends religious service: Patient refused    Active member of club or organization: Patient refused    Attends meetings of clubs or organizations: Patient refused    Relationship status: Patient refused  . Intimate partner violence:    Fear of current or ex partner: Patient refused    Emotionally abused: Patient refused    Physically abused: Patient refused    Forced sexual activity: Patient refused  Other Topics Concern  . Not on file  Social History Narrative   Marital status:  Married x 28 years; happily married; third marriage.      Children: none; 2 stepdaughters; 3 grandchildren.  3 gg.      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      Tobacco:  None      Alcohol:  None; socially      Drugs: none     Exercise: Regularly exercise/walking; walking daily.  No formal exercise plan in 2018.      ADLs: independent with ADLs.   Drives; no assistant device.      Advanced Directives:  Yes; FULL  CODE.  No prolonged measures.      Seatbelt: 100%         Family History  Problem Relation Age of Onset  . Stroke Mother   . Diabetes Mother   . Lung cancer Father   . Cancer Father        lung cancer  . Diabetes Sister   . Diabetes Brother        Objective:    BP 122/70   Pulse 93   Temp 98 F (36.7 C) (Oral)   Resp 16   Ht 5' 9.69" (1.77 m)   Wt 289 lb (131.1 kg)   SpO2 96%   BMI 41.84 kg/m  Physical Exam  Constitutional: He is oriented to person, place, and time. He appears well-developed and well-nourished. No distress.  HENT:  Head: Normocephalic and atraumatic.  Right Ear: External ear normal.  Left Ear: External ear normal.  Nose: Nose normal.  Mouth/Throat: Oropharynx is clear and moist.  Eyes: Pupils are equal, round, and reactive to light. Conjunctivae and EOM are normal.  Neck: Normal range of motion. Neck supple. Carotid bruit is not present.  No thyromegaly present.  Cardiovascular: Normal rate, regular rhythm, normal heart sounds and intact distal pulses. Exam reveals no gallop and no friction rub.  No murmur heard. Pulmonary/Chest: Effort normal and breath sounds normal. He has no wheezes. He has no rales.  Abdominal: Soft. Bowel sounds are normal. He exhibits no distension and no mass. There is no tenderness. There is no rebound and no guarding.  Musculoskeletal:       Right shoulder: Normal.       Left shoulder: Normal.       Cervical back: Normal.  Lymphadenopathy:    He has no cervical adenopathy.  Neurological: He is alert and oriented to person, place, and time. He has normal reflexes. No cranial nerve deficit. He exhibits normal muscle tone. Coordination normal.  Skin: Skin is warm and dry. No rash noted. He is not diaphoretic.  Psychiatric: He has a normal mood and affect. His behavior is normal. Judgment and thought content normal.   No results found. Depression screen [facility] 2/9 12/22/2017 11/29/2017 11/03/2017 07/07/2017 04/02/2017  Decreased Interest 0 0 0 0 0  Down, Depressed, Hopeless 0 0 0 0 0  PHQ - 2 Score 0 0 0 0 0   Fall Risk  12/22/2017 11/29/2017 11/03/2017 07/07/2017 04/02/2017  Falls in the past year? No No No No No        Assessment & Plan:   1. Acute deep vein thrombosis (DVT) of popliteal vein of left lower extremity (HCC)   2. Atherosclerosis of native coronary artery of native heart with stable angina pectoris ([place])   3. Aortic atherosclerosis (HCC)   4. Abdominal aortic aneurysm (AAA) without rupture (HCC)     Recurrent DVT left popliteal vein following prolonged illness requiring admission for urosepsis following acute influenza infection; this is the second DVT for patient in this leg.  Initial DVT following bladder resection for bladder cancer ten years ago.  Second/current DVT following prolonged illness with hospitalization; refer to vascular surgery to evaluate vascular anatomy of LLE. Continue  Eliquis therapy.  Urosepsis: recovering well from recent illness; slowly gaining strength.  Orders Placed This Encounter  Procedures  . CBC with Differential/Platelet  . Comprehensive metabolic panel  . Coagulation Panel  . Ambulatory referral to Vascular Surgery    Referral Priority:   Routine    Referral Type:  Surgical    Referral Reason:   Specialty Services Required    Requested Specialty:   Vascular Surgery    Number of Visits Requested:   1   No orders of the defined types were placed in this encounter.   Return in about 1 month (around 01/19/2018) for complete physical examiniation.   Deaire Mcwhirter Elayne Guerin, M.D. Primary Care at [facility] previously [facility] [place] [address] [place], [place]  [address] [phone] phone [phone] fax

## 2017-12-22 NOTE — Patient Instructions (Addendum)
IF you received an x-ray today, you will receive an invoice from [facility]. Please contact [facility] at [phone] with questions or concerns regarding your invoice.   IF you received labwork today, you will receive an invoice from [facility]. Please contact [facility] at [phone] with questions or concerns regarding your invoice.   Our billing staff will not be able to assist you with questions regarding bills from these companies.  You will be contacted with the lab results as soon as they are available. The fastest way to get your results is to activate your My Chart account. Instructions are located on the last page of this paperwork. If you have not heard from [place] regarding the results in 2 weeks, please contact this office.      Deep Vein Thrombosis Deep vein thrombosis (DVT) is a condition in which a blood clot forms in a deep vein, such as a lower leg, thigh, or arm vein. A clot is blood that has thickened into a gel or solid. This condition is dangerous. It can lead to serious and even life-threatening complications if the clot travels to the lungs and causes a blockage (pulmonary embolism). It can also damage veins in the leg. This can result in leg pain, swelling, discoloration, and sores (post-thrombotic syndrome). What are the causes? This condition may be caused by:  A slowdown of blood flow.  Damage to a vein.  A condition that makes blood clot more easily.  What increases the risk? The following factors may make you more likely to develop this condition:  Being overweight.  Being elderly, especially over age 53.  Sitting or lying down for more than four hours.  Lack of physical activity (sedentary lifestyle).  Being pregnant, giving birth, or having recently given birth.  Taking medicines that contain estrogen.  Smoking.  A history of any of the following: ? Blood clots or blood clotting disease. ? Peripheral vascular  disease. ? Inflammatory bowel disease. ? Cancer. ? Heart disease. ? Genetic conditions that affect how blood clots. ? Neurological diseases that affect the legs (leg paresis). ? Injury. ? Major or lengthy surgery. ? A central line placed inside a large vein.  What are the signs or symptoms? Symptoms of this condition include:  Swelling, pain, or tenderness in an arm or leg.  Warmth, redness, or discoloration in an arm or leg.  If the clot is in your leg, symptoms may be more noticeable or worse when you stand or walk. Some people do not have any symptoms. How is this diagnosed? This condition is diagnosed with:  A medical history.  A physical exam.  Tests, such as: ? Blood tests. These are done to see how your blood clots. ? Imaging tests. These are done to check for clots. Tests may include:  Ultrasound.  CT scan.  MRI.  X-ray.  Venogram. For this test, X-rays are taken after a dye is injected into a vein.  How is this treated? Treatment for this condition depends on the cause, your risk for bleeding or developing more clots, and any medical conditions you have. Treatment may include:  Taking blood thinners (also called anticoagulants). These medicines may be taken by mouth, injected under the skin, or injected through an IV tube (catheter). These medicines prevent clots from forming.  Injecting medicine that dissolves blood clots into the affected vein (catheter-directed thrombolysis).  Having surgery. Surgery may be done to: ? Remove the clot. ? Place a filter in a large  vein to catch blood clots before they reach the lungs.  Some treatments may be continued for up to six months. Follow these instructions at home: If you are taking an oral blood thinner:  Take the medicine exactly as told by your health care provider. Some blood thinners need to be taken at the same time every day. Do not skip a dose.  Ask your health care provider about what foods and drugs  interact with the medicine.  Ask about possible side effects. General instructions  Blood thinners can cause easy bruising and difficulty stopping bleeding. Because of this, if you are taking or were given a blood thinner: ? Hold pressure over cuts for longer than usual. ? Tell your dentist and other health care providers that you are taking blood thinners before having any procedures that can cause bleeding. ? Avoid contact sports.  Take over-the-counter and prescription medicines only as told by your health care provider.  Return to your normal activities as told by your health care provider. Ask your health care provider what activities are safe for you.  Wear compression stockings if recommended by your health care provider.  Keep all follow-up visits as told by your health care provider. This is important. How is this prevented? To lower your risk of developing this condition again:  For 30 or more minutes every day, do an activity that: ? Involves moving your arms and legs. ? Increases your heart rate.  When traveling for longer than four hours: ? Exercise your arms and legs every hour. ? Drink plenty of water. ? Avoid drinking alcohol.  Avoid sitting or lying for a long time without moving your legs.  Stay a healthy weight.  If you are a woman who is older than age 13, avoid unnecessary use of medicines that contain estrogen.  Do not use any products that contain nicotine or tobacco, such as cigarettes and e-cigarettes. This is especially important if you take estrogen medicines. If you need help quitting, ask your health care provider.  Contact a health care provider if:  You miss a dose of your blood thinner.  You have nausea, vomiting, or diarrhea that lasts for more than one day.  Your menstrual period is heavier than usual.  You have unusual bruising. Get help right away if:  You have new or increased pain, swelling, or redness in an arm or leg.  You have  numbness or tingling in an arm or leg.  You have shortness of breath.  You have chest pain.  You have a rapid or irregular heartbeat.  You feel light-headed or dizzy.  You cough up blood.  There is blood in your vomit, stool, or urine.  You have a serious fall or accident, or you hit your head.  You have a severe headache or confusion.  You have a cut that will not stop bleeding. These symptoms may represent a serious problem that is an emergency. Do not wait to see if the symptoms will go away. Get medical help right away. Call your local emergency services (911 in the [place] Do not drive yourself to the hospital. Summary  DVT is a condition in which a blood clot forms in a deep vein, such as a lower leg, thigh, or arm vein.  Symptoms can include swelling, warmth, pain, and redness in your leg or arm.  Treatment may include taking blood thinners, injecting medicine that dissolves blood clots,wearing compression stockings, or surgery.  If you are prescribed blood thinners, take  them exactly as told. This information is not intended to replace advice given to you by your health care provider. Make sure you discuss any questions you have with your health care provider. Document Released: 08/12/2005 Document Revised: 09/14/2016 Document Reviewed: 09/14/2016 Elsevier Interactive Patient Education  2018 [employer].

## 2017-12-25 ENCOUNTER — Other Ambulatory Visit: Payer: Self-pay | Admitting: Family Medicine

## 2017-12-25 NOTE — Telephone Encounter (Signed)
Clopidogrel (Plavix) refill Last OV: 12/02/17 "Stop Plavix" Last Refill:11/03/17 end date 12/02/17

## 2017-12-26 LAB — COAGULATION PANEL
APTT PPP: 23.3 s (ref 22.9–30.2)
AT III AG PPP IMM-ACNC: 96 % (ref 72–124)
AntiThromb III Func: 118 % (ref 75–135)
Anticardiolipin IgA: <9 APL U/mL (ref 0–11)
Anticardiolipin IgM: <9 MPL U/mL (ref 0–12)
DPT CONFIRM RATIO: 1.08 ratio (ref 0.00–1.40)
DPT: 31.2 s (ref 0.0–55.0)
Dilute Viper Venom Time: 40 s (ref 0.0–47.0)
Factor V Activity: 145 % (ref 70–150)
Homocysteine: 12.7 umol/L (ref 0.0–15.0)
INR: 0.9 (ref 0.9–1.1)
PROTEIN C ANTIGEN: 111 % (ref 60–150)
PROTEIN S AG FREE: 132 % (ref 57–157)
PT: 9.5 s — AB (ref 9.6–11.5)
PTT Lupus Anticoagulant: 30.8 s (ref 0.0–51.9)
Protein C Activity: 121 % (ref 73–180)
Protein S Activity: 89 % (ref 63–140)
Protein S Ag, Total: 117 % (ref 60–150)
THROMBIN TIME: 17.8 s (ref 0.0–23.0)

## 2017-12-26 LAB — CBC WITH DIFFERENTIAL/PLATELET
BASOS ABS: 0 10*3/uL (ref 0.0–0.2)
BASOS: 0 %
EOS (ABSOLUTE): 0.2 10*3/uL (ref 0.0–0.4)
Eos: 2 %
Hematocrit: 39.7 % (ref 37.5–51.0)
Hemoglobin: 13.3 g/dL (ref 13.0–17.7)
Immature Grans (Abs): 0 10*3/uL (ref 0.0–0.1)
Immature Granulocytes: 0 %
LYMPHS ABS: 2.3 10*3/uL (ref 0.7–3.1)
Lymphs: 24 %
MCH: 33.3 pg — AB (ref 26.6–33.0)
MCHC: 33.5 g/dL (ref 31.5–35.7)
MCV: 100 fL — AB (ref 79–97)
Monocytes Absolute: 0.8 10*3/uL (ref 0.1–0.9)
Monocytes: 8 %
NEUTROS ABS: 6.1 10*3/uL (ref 1.4–7.0)
Neutrophils: 66 %
Platelets: 284 10*3/uL (ref 150–379)
RBC: 3.99 x10E6/uL — ABNORMAL LOW (ref 4.14–5.80)
RDW: 15.3 % (ref 12.3–15.4)
WBC: 9.5 10*3/uL (ref 3.4–10.8)

## 2017-12-26 LAB — COMPREHENSIVE METABOLIC PANEL
A/G RATIO: 1.3 (ref 1.2–2.2)
ALBUMIN: 4.3 g/dL (ref 3.5–4.8)
ALK PHOS: 61 IU/L (ref 39–117)
ALT: 57 IU/L — ABNORMAL HIGH (ref 0–44)
AST: 51 IU/L — ABNORMAL HIGH (ref 0–40)
BILIRUBIN TOTAL: 0.5 mg/dL (ref 0.0–1.2)
BUN / CREAT RATIO: 18 (ref 10–24)
BUN: 14 mg/dL (ref 8–27)
CHLORIDE: 99 mmol/L (ref 96–106)
CO2: 25 mmol/L (ref 20–29)
Calcium: 9.3 mg/dL (ref 8.6–10.2)
Creatinine, Ser: 0.8 mg/dL (ref 0.76–1.27)
GFR calc Af Amer: 102 mL/min/{1.73_m2} (ref >59)
GFR calc non Af Amer: 88 mL/min/{1.73_m2} (ref >59)
GLUCOSE: 147 mg/dL — AB (ref 65–99)
Globulin, Total: 3.2 g/dL (ref 1.5–4.5)
POTASSIUM: 4.4 mmol/L (ref 3.5–5.2)
SODIUM: 139 mmol/L (ref 134–144)
Total Protein: 7.5 g/dL (ref 6.0–8.5)

## 2017-12-27 NOTE — Progress Notes (Signed)
Cardiology Office Note  Date:  12/30/2017   ID:  Christian [name], DOB [birth year], MRN [id number]  PCP:  [name], MD   Chief Complaint  Patient presents with  . other    DVT c/o weakness . Meds reviewed verbally with pt.    HPI:  Christian [name] is a very pleasant 77 year old gentleman with a history of  obesity , CAD,  moderate disease in a diagonal vessel, RCA and proximal circumflex, obtuse marginal branch with normal systolic function, diastolic dysfunction on echocardiogram,  DVT and PE, after first cancer operation at [facility] Bladder surgery,  obstructive sleep apnea, hypertension,  hyperlipidemia,  small AAA,  who presents for routine followup of his coronary artery disease, recurrent DVT  Hospitalization March 2019 with UTI hematuria and sepsis Hospital records reviewed with the patient in detail Diagnosed with Escherichia coli bacteremia Treated with antibiotics, ceftriaxone to Keflex Positive blood cultures positive for ecoli and enterobacter  CT scan November 17, 2017 with no PE  12/02/2017 developed swelling and pain Ultrasound left popliteal vein posterior tibial vein peroneal veins with DVT  Hospitalized for DVT April 2019 Hospital records reviewed with the patient in detail Diagnosed with acute DVT eliquis 5 twice a day  In follow-up today he reports that he feels tired, has no energy Difficult time recovering from infection above No shortness of breath Denies any recent chest pain with exertion Does not think he can exercise yet  Total cholesterol 150, LDL 70 on low-dose Lipitor  EKG personally reviewed by myself on todays visit Shows normal sinus rhythm rate 73 bpm no significant ST or T-wave changes  Other past medical history  ER 12/01/2016 Urine darker, Pain in ABD from coughing CT scan of his abdomen showing Kidney stone, 2 mm  Hematuria, Possible urinary tract infection, placed on Bactrim 10 days  Last stress test was in 03/2008. He had  an inferior wall defect consistent with ischemia. Significant GI uptake artifact noted in the inferior wall. EF 42%. No cardiac cath was performed at that time in follow up.  ECHO in 02/2008 shows normal EF, otherwise normal study  PMH:   has a past medical history of Aortic aneurysm [facility]), Bladder cancer ([place]), Coronary artery disease, DVT (deep venous thrombosis) ([place]) (10/07/2007), Glucose intolerance (impaired glucose tolerance), Hydrocele, Hyperlipidemia, Hypertension, MI (myocardial infarction) ([place]) (1997), Nephrolithiasis, and Sleep apnea.  PSH:    Past Surgical History:  Procedure Laterality Date  . [place]  . bladder removed    . CARDIAC CATHETERIZATION    . COLONOSCOPY  08/27/2011   normal.  [facility].  Marland Kitchen HYDROCELE EXCISION    . PROSTATE SURGERY     prostatectomy with bladder resection.    Current Outpatient Medications  Medication Sig Dispense Refill  . apixaban (ELIQUIS) 5 MG TABS tablet Take 1 tablet (5 mg total) by mouth 2 (two) times daily. 60 tablet 11  . atorvastatin (LIPITOR) 10 MG tablet Take 1 tablet (10 mg total) by mouth daily at 6 PM. 90 tablet 3  . nitroGLYCERIN (NITROSTAT) 0.4 MG SL tablet Place 1 tablet (0.4 mg total) under the tongue every 5 (five) minutes as needed for chest pain. 25 tablet 6  . Omega-3 Fatty Acids (RA FISH OIL) 1000 MG CAPS Take 1,000 mg by mouth 2 (two) times daily.     . ramipril (ALTACE) 10 MG capsule Take 1 capsule (10 mg total) by mouth daily. 90 capsule 3   No current  facility-administered medications for this visit.      Allergies:   Ceftriaxone; Sulfasalazine; and Sulfa antibiotics   Social History:  The patient  reports that he has never smoked. He has never used smokeless tobacco. He reports that he does not drink alcohol or use drugs.   Family History:   family history includes Cancer in his father; Diabetes in his brother, mother, and sister; Lung cancer in his father; Stroke in his mother.     Review of Systems: Review of Systems  Constitutional: Positive for malaise/fatigue.  Respiratory: Negative.   Cardiovascular: Negative.   Gastrointestinal: Negative.   Musculoskeletal: Negative.   Neurological: Negative.   Psychiatric/Behavioral: Negative.   All other systems reviewed and are negative.    PHYSICAL EXAM: VS:  BP (!) 153/78 (BP Location: Left Arm, Patient Position: Sitting, Cuff Size: Large)   Pulse 73   Ht 5\' 11"  (1.803 m)   Wt 291 lb 8 oz (132.2 kg)   BMI 40.66 kg/m  , BMI Body mass index is 40.66 kg/m. Constitutional:  oriented to person, place, and time. No distress. Obese HENT:  Head: Normocephalic and atraumatic.  Eyes:  no discharge. No scleral icterus.  Neck: Normal range of motion. Neck supple. No JVD present.  Cardiovascular: Normal rate, regular rhythm, normal heart sounds and intact distal pulses. Exam reveals no gallop and no friction rub. No edema No murmur heard. Pulmonary/Chest: Effort normal and breath sounds normal. No stridor. No respiratory distress.  no wheezes.  no rales.  no tenderness.  Abdominal: Soft.  no distension.  no tenderness.  Musculoskeletal: Normal range of motion.  no  tenderness or deformity.  Neurological:  normal muscle tone. Coordination normal. No atrophy Skin: Skin is warm and dry. No rash noted. not diaphoretic.  Psychiatric:  normal mood and affect. behavior is normal. Thought content normal.    Recent Labs: 12/22/2017: ALT 57; BUN 14; Creatinine, Ser 0.80; Hemoglobin 13.3; Platelets 284; Potassium 4.4; Sodium 139    Lipid Panel Lab Results  Component Value Date   CHOL 128 11/03/2017   HDL 33 (L) 11/03/2017   LDLCALC 58 11/03/2017   TRIG 184 (H) 11/03/2017      Wt Readings from Last 3 Encounters:  12/30/17 291 lb 8 oz (132.2 kg)  12/22/17 289 lb (131.1 kg)  11/29/17 283 lb (128.4 kg)       ASSESSMENT AND PLAN:  Mixed hyperlipidemia - Plan: EKG 12-Lead Cholesterol is at goal on the current  lipid regimen. No changes to the medications were made. Stable  DVT: Recurrent episodes Most recent episode after being in bed for UTI sepsis Documented on ultrasound April 2019 Will likely require eliquis 5 mg 3 times a day for 6 months think could decrease the dose down to 2.5 mg twice a day prophylaxis  Atherosclerosis of native coronary artery of native heart with stable angina pectoris ([place]) - Plan: EKG 12-Lead He is concerned his fatigue could be secondary to underlying coronary disease Recommended he start his walking program, continue recovery after sepsis If he continues to feel weak in the next several weeks additional testing could be done  OBESITY-MORBID (>100') - Plan: EKG 12-Lead We have encouraged continued exercise, careful diet management in an effort to lose weight. Recommended low carbohydrate diet  Abdominal aortic aneurysm (AAA) without rupture ([place]) - Plan: EKG 12-Lead  minimally dilated Followed by vascular  Aortic atherosclerosis (HCC) CT scan reviewed showing moderate distal aortic and iliac arterial disease Coronary calcifications Goal LDL less  than 70 Most recent LDL 58  Detailed review of records above, several hospital admissions  Total encounter time more than 45 minutes  Greater than 50% was spent in counseling and coordination of care with the patient   Disposition:   F/U  12 months   Orders Placed This Encounter  Procedures  . EKG 12-Lead     Signed, Esmond Plants, M.D., Ph.D. 12/30/2017  [place], [place]

## 2017-12-30 ENCOUNTER — Encounter: Payer: Self-pay | Admitting: Cardiovascular Disease

## 2017-12-30 ENCOUNTER — Ambulatory Visit: Payer: Medicare HMO | Admitting: Cardiovascular Disease

## 2017-12-30 VITALS — BP 153/78 | HR 73 | Ht 71.0 in | Wt 291.5 lb

## 2017-12-30 DIAGNOSIS — I700 Atherosclerosis of aorta: Secondary | ICD-10-CM | POA: Diagnosis not present

## 2017-12-30 DIAGNOSIS — I714 Abdominal aortic aneurysm, without rupture, unspecified: Secondary | ICD-10-CM

## 2017-12-30 DIAGNOSIS — I25118 Atherosclerotic heart disease of native coronary artery with other forms of angina pectoris: Secondary | ICD-10-CM | POA: Diagnosis not present

## 2017-12-30 DIAGNOSIS — E7800 Pure hypercholesterolemia, unspecified: Secondary | ICD-10-CM

## 2017-12-30 DIAGNOSIS — I82599 Chronic embolism and thrombosis of other specified deep vein of unspecified lower extremity: Secondary | ICD-10-CM | POA: Diagnosis not present

## 2017-12-30 NOTE — Patient Instructions (Addendum)
Medication Instructions:    Ask price of xarelto 20 mg daily (instead of eliquis) Medication Samples have been provided to the patient.  Drug name: Eliquis       Strength: 5 mg        Qty: 4 boxes  LOT: [id number]  Exp.Date: Jun 2021  Research imitrex for migraines  Labwork:  No new labs needed  Testing/Procedures:  No further testing at this time   Follow-Up: It was a pleasure seeing you in the office today. Please call us if you have new issues that need to be addressed before your next appt.  [phone]  Your physician wants you to follow-up in: 12 months.  You will receive a reminder letter in the mail two months in advance. If you don't receive a letter, please call our office to schedule the follow-up appointment.  If you need a refill on your cardiac medications before your next appointment, please call your pharmacy.  For educational health videos Log in to : [email or web address] Or : [email or web address], password : [email or web address]

## 2018-01-04 IMAGING — DX DG CHEST 2V
2 series · 2 of 2 positions shown · non-contrast
Comparison: Chest x-ray 12/01/2016 .

CLINICAL DATA: Cough and fatigue.

EXAM:
CHEST  2 VIEW

[chest pa]
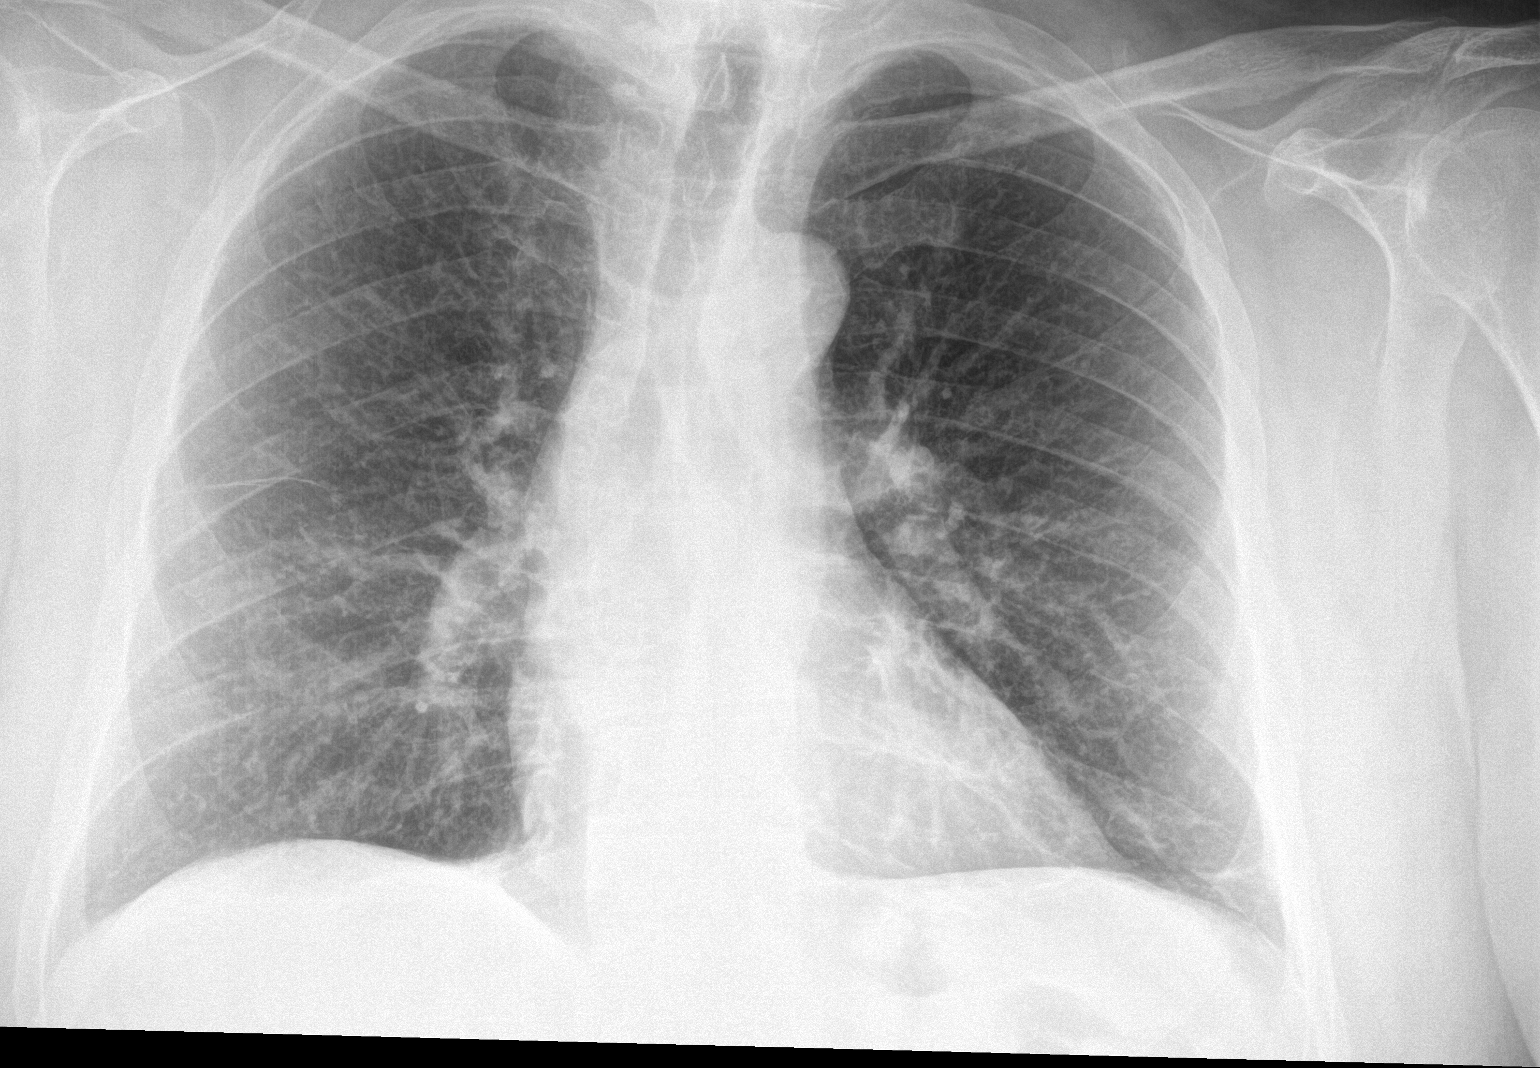

[chest lat]
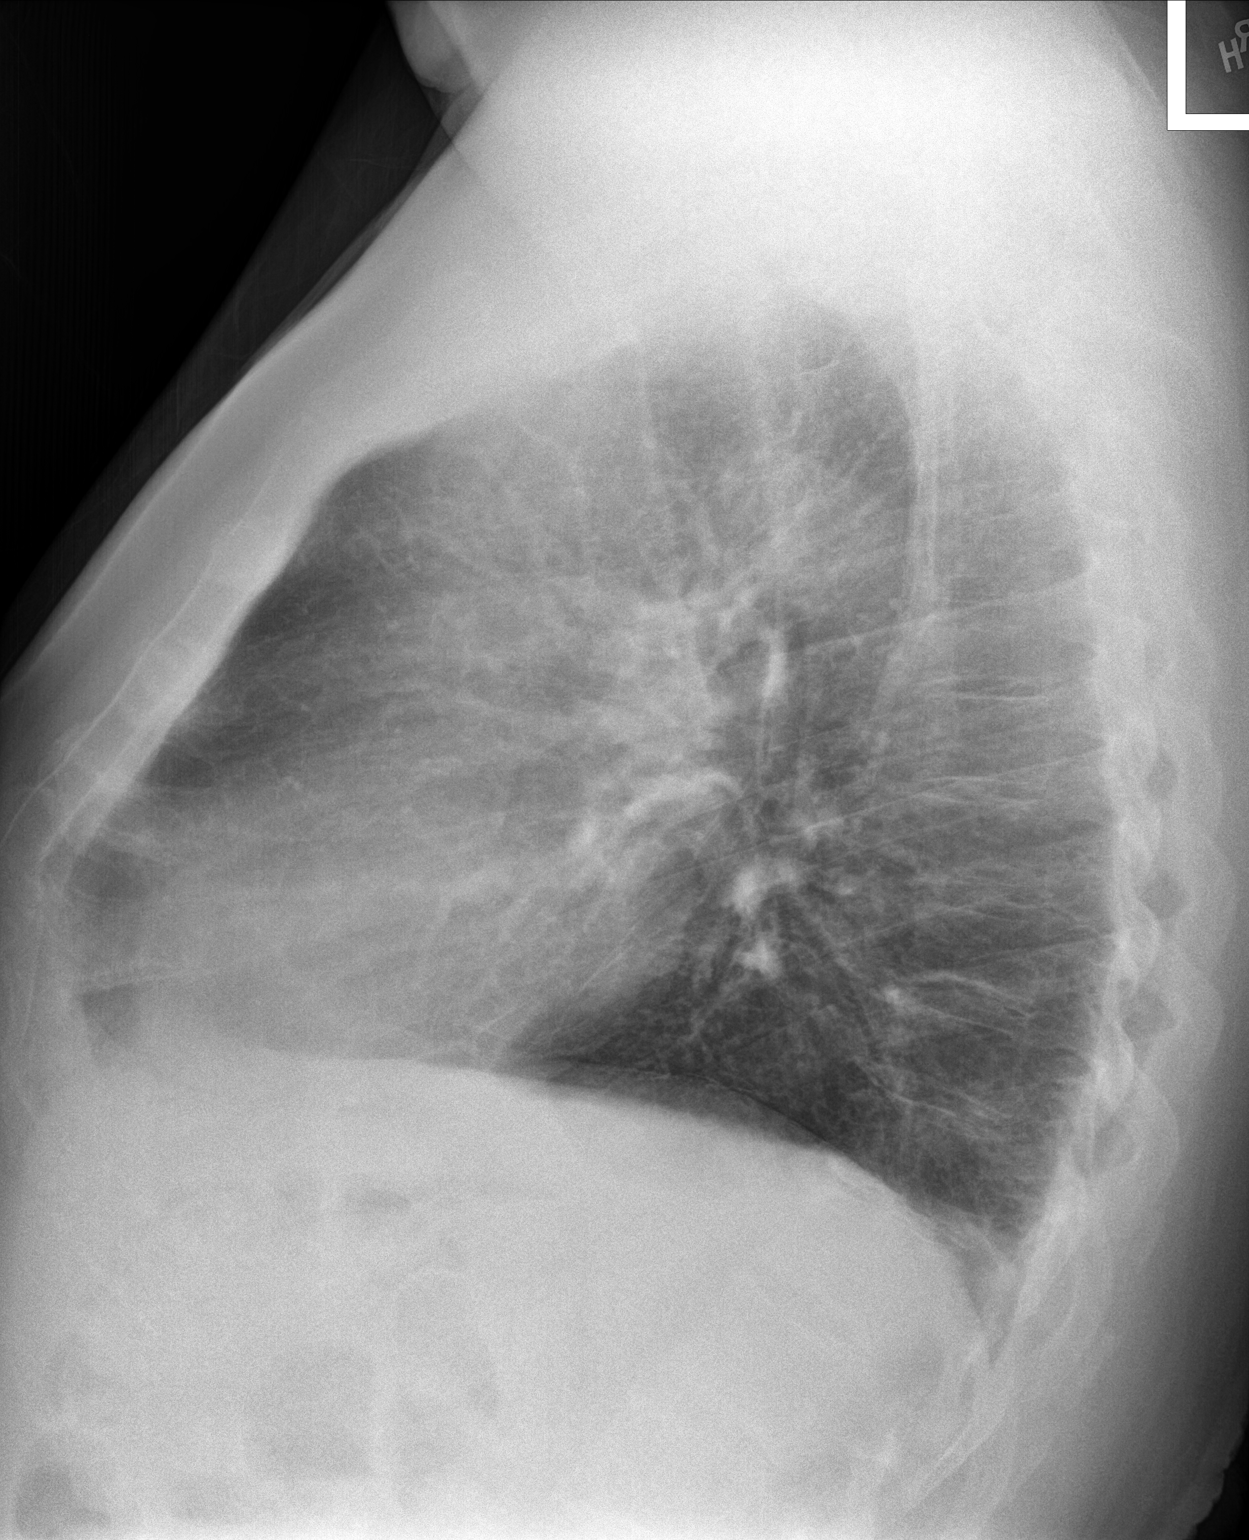

[2 of 2 positions shown; findings below may reference images not displayed]

FINDINGS: Mediastinum and hilar structures normal. Heart size normal. No focal
infiltrate. Mild bilateral subsegmental atelectasis. No pleural
effusion or pneumothorax .
IMPRESSION: 1. No acute cardiopulmonary disease.

2. Mild bibasilar subsegmental atelectasis and/or scarring.

## 2018-01-05 ENCOUNTER — Ambulatory Visit: Payer: Medicare HMO | Admitting: Family Medicine

## 2018-01-06 ENCOUNTER — Telehealth: Payer: Self-pay | Admitting: Cardiovascular Disease

## 2018-01-06 MED ORDER — APIXABAN 5 MG PO TABS: 5.0000 mg | ORAL_TABLET | Freq: Two times a day (BID) | ORAL | 3 refills | Status: DC

## 2018-01-06 NOTE — Telephone Encounter (Signed)
Prescription faxed to number requested.

## 2018-01-06 NOTE — Telephone Encounter (Signed)
Signed prescription given to Gerline Legacy RN to file with paperwork.

## 2018-01-06 NOTE — Telephone Encounter (Signed)
[employer] patient assistance [employer] calling for a new rx to be sent for assistance approval.  The Eliquis should be written 5 mg po BID x 180 tablets   Please fax to   [phone]  Case Number [id number]

## 2018-01-06 NOTE — Telephone Encounter (Signed)
New written prescription for Eliquis printed as requested for 180 tablets. Given to [employer] to have Dr Rockey Situ sign.

## 2018-01-13 ENCOUNTER — Encounter: Payer: Self-pay | Admitting: Family Medicine

## 2018-01-14 NOTE — Telephone Encounter (Signed)
Received letter from [employer] that patient has been approved to receive Eliquis free of charge from 01/12/18 through 01/23/2018.

## 2018-01-21 ENCOUNTER — Encounter: Payer: Self-pay | Admitting: Family Medicine

## 2018-02-05 DIAGNOSIS — Z936 Other artificial openings of urinary tract status: Secondary | ICD-10-CM | POA: Diagnosis not present

## 2018-02-13 ENCOUNTER — Ambulatory Visit (INDEPENDENT_AMBULATORY_CARE_PROVIDER_SITE_OTHER): Payer: Medicare HMO

## 2018-02-13 ENCOUNTER — Encounter (INDEPENDENT_AMBULATORY_CARE_PROVIDER_SITE_OTHER): Payer: Self-pay | Admitting: Vascular Surgery

## 2018-02-13 ENCOUNTER — Ambulatory Visit (INDEPENDENT_AMBULATORY_CARE_PROVIDER_SITE_OTHER): Payer: Medicare HMO | Admitting: Vascular Surgery

## 2018-02-13 VITALS — BP 135/80 | HR 57 | Resp 16 | Ht 71.0 in | Wt 292.0 lb

## 2018-02-13 DIAGNOSIS — I714 Abdominal aortic aneurysm, without rupture, unspecified: Secondary | ICD-10-CM

## 2018-02-13 DIAGNOSIS — I700 Atherosclerosis of aorta: Secondary | ICD-10-CM | POA: Diagnosis not present

## 2018-02-13 DIAGNOSIS — I10 Essential (primary) hypertension: Secondary | ICD-10-CM | POA: Diagnosis not present

## 2018-02-13 DIAGNOSIS — E7800 Pure hypercholesterolemia, unspecified: Secondary | ICD-10-CM

## 2018-02-13 NOTE — Progress Notes (Signed)
Subjective:    Patient ID: Christian [name], male    DOB: 10-15-1940, 77 y.o.   MRN: [id number] Chief Complaint  Patient presents with  . Follow-up    One year AAA U/S follow-up   Patient presents for a yearly AAA follow-up.  Patient presents without complaint.  Patient denies any abdominal pain, back pain or thrombosis to the bilateral lower extremity.  Patient's blood pressure today is 135/80.  The patient denies any claudication-like symptoms, rest pain or ulcers to the bilateral lower extremity.  The patient underwent a abdominal aortic study which was notable for a AAA measuring 3.06 cm previous measurement 3.3 cm in maximal diameter when compared to the previous exam on 02/09/2016.  Right common iliac artery measuring 1.1 x 1.3, left common iliac artery measuring 1.0 x 1.1 cm.  Biphasic waveforms throughout.  Denies any fever, nausea or vomiting.  Review of Systems  Constitutional: Negative.   HENT: Negative.   Eyes: Negative.   Respiratory: Negative.   Cardiovascular: Negative.   Gastrointestinal: Negative.   Endocrine: Negative.   Genitourinary: Negative.   Musculoskeletal: Negative.   Skin: Negative.   Allergic/Immunologic: Negative.   Neurological: Negative.   Hematological: Negative.   Psychiatric/Behavioral: Negative.       Objective:   Physical Exam  Constitutional: He is oriented to person, place, and time. He appears well-developed and well-nourished. No distress.  HENT:  Head: Normocephalic and atraumatic.  Right Ear: External ear normal.  Left Ear: External ear normal.  Eyes: Pupils are equal, round, and reactive to light. Conjunctivae and EOM are normal.  Neck: Normal range of motion.  Cardiovascular: Normal rate, regular rhythm, normal heart sounds and intact distal pulses.  Pulses:      Radial pulses are 2+ on the right side, and 2+ on the left side.       Dorsalis pedis pulses are 2+ on the right side, and 2+ on the left side.       Posterior tibial pulses  are 2+ on the right side, and 2+ on the left side.  Pulmonary/Chest: Effort normal and breath sounds normal.  Abdominal: Soft. Bowel sounds are normal. He exhibits no distension. There is no tenderness. There is no guarding.  Musculoskeletal: Normal range of motion. He exhibits no edema.  Neurological: He is alert and oriented to person, place, and time.  Skin: Skin is warm and dry. He is not diaphoretic.  Psychiatric: He has a normal mood and affect. His behavior is normal. Judgment and thought content normal.  Vitals reviewed.  BP 135/80 (BP Location: Right Arm, Patient Position: Sitting)   Pulse (!) 57   Resp 16   Ht 5\' 11"  (1.803 m)   Wt 292 lb (132.5 kg)   BMI 40.73 kg/m   Past Medical History:  Diagnosis Date  . Aortic aneurysm ([place])    followed by vascular surgery yearly.  . Bladder cancer [facility])    s/p bladder resection. [place]  . Coronary artery disease    AMI anterior wall 1997; s/p PTCA to LAD 11/1995 [facility].  Marland Kitchen DVT (deep venous thrombosis) ([place]) 10/07/2007  . Glucose intolerance (impaired glucose tolerance)   . Hydrocele   . Hyperlipidemia   . Hypertension   . MI (myocardial infarction) ([place]) 1997  . Nephrolithiasis   . Sleep apnea    Social History   Socioeconomic History  . Marital status: Married    Spouse name: Not on file  . Number of children: Not on file  .  Years of education: Not on file  . Highest education level: Not on file  Occupational History  . Occupation: retired  Scientific laboratory technician  . Financial resource strain: Patient refused  . Food insecurity:    Worry: Patient refused    Inability: Patient refused  . Transportation needs:    Medical: Patient refused    Non-medical: Patient refused  Tobacco Use  . Smoking status: Never Smoker  . Smokeless tobacco: Never Used  . Tobacco comment: tobacco use- no   Substance and Sexual Activity  . Alcohol use: No  . Drug use: No  . Sexual activity: Yes  Lifestyle  . Physical activity:    Days per week:  Patient refused    Minutes per session: Patient refused  . Stress: Patient refused  Relationships  . Social connections:    Talks on phone: Patient refused    Gets together: Patient refused    Attends religious service: Patient refused    Active member of club or organization: Patient refused    Attends meetings of clubs or organizations: Patient refused    Relationship status: Patient refused  . Intimate partner violence:    Fear of current or ex partner: Patient refused    Emotionally abused: Patient refused    Physically abused: Patient refused    Forced sexual activity: Patient refused  Other Topics Concern  . Not on file  Social History Narrative   Marital status:  Married x 28 years; happily married; third marriage.      Children: none; 2 stepdaughters; 3 grandchildren.  3 gg.      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      Tobacco:  None      Alcohol:  None; socially      Drugs: none     Exercise: Regularly exercise/walking; walking daily.  No formal exercise plan in 2018.      ADLs: independent with ADLs.   Drives; no assistant device.      Advanced Directives:  Yes; FULL CODE.  No prolonged measures.      Seatbelt: 100%         Past Surgical History:  Procedure Laterality Date  . [place]  . bladder removed    . CARDIAC CATHETERIZATION    . COLONOSCOPY  08/27/2011   normal.  [facility].  Marland Kitchen HYDROCELE EXCISION    . PROSTATE SURGERY     prostatectomy with bladder resection.   Family History  Problem Relation Age of Onset  . Stroke Mother   . Diabetes Mother   . Lung cancer Father   . Cancer Father        lung cancer  . Diabetes Sister   . Diabetes Brother    Allergies  Allergen Reactions  . Ceftriaxone Hives  . Sulfasalazine Hives  . Sulfa Antibiotics Hives      Assessment & Plan:  Patient presents for a yearly AAA follow-up.  Patient presents without complaint.  Patient denies any abdominal pain,  back pain or thrombosis to the bilateral lower extremity.  Patient's blood pressure today is 135/80.  The patient denies any claudication-like symptoms, rest pain or ulcers to the bilateral lower extremity.  The patient underwent a abdominal aortic study which was notable for a AAA measuring 3.06 cm previous measurement 3.3 cm in maximal diameter when compared to the previous exam on 02/09/2016.  Right common iliac artery measuring 1.1 x 1.3, left common iliac  artery measuring 1.0 x 1.1 cm.  Biphasic waveforms throughout.  Denies any fever, nausea or vomiting.  1. Abdominal aortic aneurysm (AAA) without rupture ([place]) - Stable Studies reviewed with patient. Duplex stable, physical exam unremarkable.  No surgery or intervention at this time. The patient to follow up in one year with an aortic duplex. The patient has an asymptomatic abdominal aortic aneurysm that is less than 4 cm in maximal diameter.  I have reviewed the natural history of abdominal aortic aneurysm and the small risk of rupture for aneurysm less than 5 cm in size.  However, as these small aneurysms tend to enlarge over time, continued surveillance with ultrasound or CT scan is mandatory.  The patient's blood pressure is being adequately controlled however I have reviewed the importance of hypertension and lipid control and the importance of continuing his abstinence from tobacco.  The patient is also encouraged to exercise a minimum of 30 minutes 4 times a week.  Should the patient develop new onset abdominal or back pain or signs of peripheral embolization they are instructed to seek medical attention immediately and to alert the physician providing care that they have an aneurysm.  The patient voices their understanding.  - VAS [place] AAA DUPLEX; Future  2. Aortic atherosclerosis (HCC) - Stable Biphasic waveforms noted throughout Patient denies any claudication-like symptoms, rest pain or ulceration to the bilateral lower  extremity Patient denies any abdominal pain Physical exam unremarkable No indication for intervention at this time  3. Essential hypertension - Stable Encouraged good control as its slows the progression of atherosclerotic disease  4. Pure hypercholesterolemia - Stable Encouraged good control as its slows the progression of atherosclerotic disease  Current Outpatient Medications on File Prior to Visit  Medication Sig Dispense Refill  . apixaban (ELIQUIS) 5 MG TABS tablet Take 1 tablet (5 mg total) by mouth 2 (two) times daily. 180 tablet 3  . atorvastatin (LIPITOR) 10 MG tablet Take 1 tablet (10 mg total) by mouth daily at 6 PM. 90 tablet 3  . nitroGLYCERIN (NITROSTAT) 0.4 MG SL tablet Place 1 tablet (0.4 mg total) under the tongue every 5 (five) minutes as needed for chest pain. 25 tablet 6  . Omega-3 Fatty Acids (RA FISH OIL) 1000 MG CAPS Take 1,000 mg by mouth 2 (two) times daily.     . ramipril (ALTACE) 10 MG capsule Take 1 capsule (10 mg total) by mouth daily. 90 capsule 3   No current facility-administered medications on file prior to visit.    There are no Patient Instructions on file for this visit. No follow-ups on file.  Haizlee Henton A Havannah Streat, PA-C

## 2018-02-17 NOTE — Progress Notes (Signed)
Subjective:    Patient ID: Christian [name], male    DOB: July 21, 1941, 77 y.o.   MRN: [id number]  02/23/2018  Annual Exam    HPI This 77 y.o. male presents for [place] and COMPLETE PHYSICAL EXAMINATION and follow-up of chronic medical conditions.   Visual Acuity Screening   Right eye Left eye Both eyes  Without correction:     With correction: 20/30 20/30 20/20     BP Readings from Last 3 Encounters:  02/23/18 128/70  02/13/18 135/80  12/30/17 (!) 153/78   Wt Readings from Last 3 Encounters:  02/23/18 291 lb 9.6 oz (132.3 kg)  02/13/18 292 lb (132.5 kg)  12/30/17 291 lb 8 oz (132.2 kg)   Immunization History  Administered Date(s) Administered  . Influenza,inj,Quad PF,6+ Mos 06/02/2013, 10/17/2015, 04/16/2016, 07/07/2017  . Pneumococcal Conjugate-13 07/31/2016  . Pneumococcal Polysaccharide-23 03/09/2008, 02/23/2018   Health Maintenance  Topic Date Due  . Samul Dada  02/05/1962  . INFLUENZA VACCINE  03/26/2018  . COLONOSCOPY  08/03/2022  . PNA vac Low Risk Adult  Completed   Bladder cancer: Jacqlyn Larsen is leaving; pressured to see more patients; taking too long with patients. Moving to [place].  Leaving March 26, 2018.  Has a replacement; highly recommending replacement; perfect fit.  Moving to [place], [place].    HTN: Patient reports good compliance with medication, good tolerance to medication, and good symptom control.    CAD: Patient reports good compliance with medication, good tolerance to medication, and good symptom control.  Recent appointment with [facility]; long term Eliquis; coupon.  DVT LEFT leg: Patient reports good compliance with medication, good tolerance to medication, and good symptom control.  Feels great; denies swelling, pain, redness, pain.  Hypercholesterolemia: Patient reports good compliance with medication, good tolerance to medication, and good symptom control.    AAA: s/p follow-up with vascular surgery; s/p [place].   Review  of Systems  Constitutional: Negative for activity change, appetite change, chills, diaphoresis, fatigue, fever and unexpected weight change.  HENT: Negative for congestion, dental problem, drooling, ear discharge, ear pain, facial swelling, hearing loss, mouth sores, nosebleeds, postnasal drip, rhinorrhea, sinus pressure, sneezing, sore throat, tinnitus, trouble swallowing and voice change.   Eyes: Negative for photophobia, pain, discharge, redness, itching and visual disturbance.  Respiratory: Negative for apnea, cough, choking, chest tightness, shortness of breath, wheezing and stridor.   Cardiovascular: Negative for chest pain, palpitations and leg swelling.  Gastrointestinal: Negative for abdominal pain, blood in stool, constipation, diarrhea, nausea and vomiting.  Endocrine: Negative for cold intolerance, heat intolerance, polydipsia, polyphagia and polyuria.  Genitourinary: Negative for decreased urine volume, difficulty urinating, discharge, dysuria, enuresis, flank pain, frequency, genital sores, hematuria, penile pain, penile swelling, scrotal swelling, testicular pain and urgency.  Musculoskeletal: Negative for arthralgias, back pain, gait problem, joint swelling, myalgias, neck pain and neck stiffness.  Skin: Negative for color change, pallor, rash and wound.  Allergic/Immunologic: Negative for environmental allergies, food allergies and immunocompromised state.  Neurological: Negative for dizziness, tremors, seizures, syncope, facial asymmetry, speech difficulty, weakness, light-headedness, numbness and headaches.  Hematological: Negative for adenopathy. Does not bruise/bleed easily.  Psychiatric/Behavioral: Negative for agitation, behavioral problems, confusion, decreased concentration, dysphoric mood, hallucinations, self-injury, sleep disturbance and suicidal ideas. The patient is not nervous/anxious and is not hyperactive.        Bedtime 1100; wakes up 630-700.    Past Medical  History:  Diagnosis Date  . Aortic aneurysm ([place])    followed by vascular surgery yearly.  Marland Kitchen  Bladder cancer [facility])    s/p bladder resection. [place]  . Coronary artery disease    AMI anterior wall 1997; s/p PTCA to LAD 11/1995 [facility].  Marland Kitchen DVT (deep venous thrombosis) ([place]) 10/07/2007  . Glucose intolerance (impaired glucose tolerance)   . Hydrocele   . Hyperlipidemia   . Hypertension   . MI (myocardial infarction) ([place]) 1997  . Nephrolithiasis   . Sleep apnea    Past Surgical History:  Procedure Laterality Date  . [place]  . bladder removed    . CARDIAC CATHETERIZATION    . COLONOSCOPY  08/27/2011   normal.  [facility].  Marland Kitchen HYDROCELE EXCISION    . PROSTATE SURGERY     prostatectomy with bladder resection.   Allergies  Allergen Reactions  . Ceftriaxone Hives  . Sulfasalazine Hives  . Sulfa Antibiotics Hives   Current Outpatient Medications on File Prior to Visit  Medication Sig Dispense Refill  . Omega-3 Fatty Acids (RA FISH OIL) 1000 MG CAPS Take 1,000 mg by mouth 2 (two) times daily.      No current facility-administered medications on file prior to visit.    Social History   Socioeconomic History  . Marital status: Married    Spouse name: Not on file  . Number of children: Not on file  . Years of education: Not on file  . Highest education level: Not on file  Occupational History  . Occupation: retired  Scientific laboratory technician  . Financial resource strain: Patient refused  . Food insecurity:    Worry: Patient refused    Inability: Patient refused  . Transportation needs:    Medical: Patient refused    Non-medical: Patient refused  Tobacco Use  . Smoking status: Never Smoker  . Smokeless tobacco: Never Used  . Tobacco comment: tobacco use- no   Substance and Sexual Activity  . Alcohol use: No  . Drug use: No  . Sexual activity: Yes  Lifestyle  . Physical activity:    Days per week: Patient refused    Minutes per session: Patient refused    . Stress: Patient refused  Relationships  . Social connections:    Talks on phone: Patient refused    Gets together: Patient refused    Attends religious service: Patient refused    Active member of club or organization: Patient refused    Attends meetings of clubs or organizations: Patient refused    Relationship status: Patient refused  . Intimate partner violence:    Fear of current or ex partner: Patient refused    Emotionally abused: Patient refused    Physically abused: Patient refused    Forced sexual activity: Patient refused  Other Topics Concern  . Not on file  Social History Narrative   Marital status:  Married x 28 years; happily married; third marriage.      Children: none; 2 stepdaughters; 3 grandchildren.  3 gg.      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      Tobacco:  None      Alcohol:  None; socially      Drugs: none     Exercise: Regularly exercise/walking; walking daily.  No formal exercise plan in 2018.      ADLs: independent with ADLs.   Drives; no assistant device.      Advanced Directives:  Yes; FULL CODE.  No prolonged measures.      Seatbelt: 100%  Family History  Problem Relation Age of Onset  . Stroke Mother   . Diabetes Mother   . Lung cancer Father   . Cancer Father        lung cancer  . Diabetes Sister   . Diabetes Brother        Objective:    BP 128/70   Pulse 69   Temp 98.6 F (37 C) (Oral)   Resp 18   Ht 5' 9.88" (1.775 m)   Wt 291 lb 9.6 oz (132.3 kg)   SpO2 93%   BMI 41.98 kg/m  Physical Exam  Constitutional: He is oriented to person, place, and time. He appears well-developed and well-nourished. No distress.  HENT:  Head: Normocephalic and atraumatic.  Right Ear: External ear normal.  Left Ear: External ear normal.  Nose: Nose normal.  Mouth/Throat: Oropharynx is clear and moist.  Eyes: Pupils are equal, round, and reactive to light. Conjunctivae and EOM are normal.  Neck: Normal range  of motion. Neck supple. Carotid bruit is not present. No thyromegaly present.  Cardiovascular: Normal rate, regular rhythm, normal heart sounds and intact distal pulses. Exam reveals no gallop and no friction rub.  No murmur heard. Pulmonary/Chest: Effort normal and breath sounds normal. He has no wheezes. He has no rales.  Abdominal: Soft. Bowel sounds are normal. He exhibits no distension and no mass. There is no tenderness. There is no rebound and no guarding. No hernia.  Right lower quadrant with urinary ostomy without erythema or abnormalities.  Musculoskeletal:       Right shoulder: Normal.       Left shoulder: Normal.       Cervical back: Normal.  Lymphadenopathy:    He has no cervical adenopathy.  Neurological: He is alert and oriented to person, place, and time. He has normal reflexes. No cranial nerve deficit. He exhibits normal muscle tone. Coordination normal.  Skin: Skin is warm and dry. No rash noted. He is not diaphoretic.  Psychiatric: He has a normal mood and affect. His behavior is normal. Judgment and thought content normal.   No results found. Depression screen [facility] 2/9 02/23/2018 12/22/2017 11/29/2017 11/03/2017 07/07/2017  Decreased Interest 0 0 0 0 0  Down, Depressed, Hopeless 0 0 0 0 0  PHQ - 2 Score 0 0 0 0 0   Fall Risk  02/23/2018 12/22/2017 11/29/2017 11/03/2017 07/07/2017  Falls in the past year? No No No No No    Functional Status Survey: Is the patient deaf or have difficulty hearing?: No Does the patient have difficulty seeing, even when wearing glasses/contacts?: No Does the patient have difficulty concentrating, remembering, or making decisions?: No Does the patient have difficulty walking or climbing stairs?: No Does the patient have difficulty dressing or bathing?: No Does the patient have difficulty doing errands alone such as visiting a doctor's office or shopping?: No     Assessment & Plan:   1. Medicare annual wellness visit, subsequent   2. Routine  physical examination   3. Atherosclerosis of native coronary artery of native heart with stable angina pectoris ([place])   4. Essential hypertension   5. Pure hypercholesterolemia   6. Hyperglycemia   7. Elevated liver function tests   8. Need for prophylactic vaccination against Streptococcus pneumoniae (pneumococcus)   9. Other abnormal glucose   10. History of bladder cancer   11. Abdominal aortic aneurysm (AAA) without rupture ([place])   12. Chronic deep vein thrombosis (DVT) of other vein of left lower  extremity ([place])    -anticipatory guidance provided --- exercise, weight loss, safe driving practices, aspirin 81mg  daily. -obtain age appropriate screening labs and labs for chronic disease management. -moderate fall risk; no evidence of depression; no evidence of hearing loss.  Discussed advanced directives and living will; also discussed end of life issues including code status.  Coronary artery disease, hypertension, hypercholesterolemia: Well-controlled on current regimen.  Obtain labs for chronic disease management.  Followed closely by cardiology/[facility]. Elevated liver function studies: Recurrent.  Repeat today.  Likely secondary to [place]. History of bladder cancer: Status post bladder resection.  Followed by Dr. Jacqlyn Larsen of urology yearly.  Will need to establish with a new urologist is Dr. Jacqlyn Larsen is moving to [facility]. Aortic aneurysm abdominal: Followed yearly by vascular surgery.  Status post follow-up with stable measurements.  Recurrent left lower extremity DVT: Stable.  Developed after prolonged illness and hospitalization.  Has upcoming appointment with Dr. due to evaluate for vascular pathology in the left leg.  Continue Eliquis.  Obesity:  Recommend weight loss, exercise for 30-60 minutes five days per week; recommend 1800 kcal restriction per day with a minimum of 60 grams of protein per day.  Eat 3 meals per day. Do not skip meals. Consider having a protein shake as a meal  replacement to aid with eliminating meal skipping. Look for products with <220 calories, <7 gm sugar, and 20-30 gm protein.  Eat breakfast within 2 hours of getting up.   Make  your plate non-starchy vegetables,  protein, and  carbohydrates at lunch and dinner.   Aim for at least 64 oz. of calorie-free beverages daily (water, Crystal Light, diet green tea, etc.). Eliminate any sugary beverages such as regular soda, sweet tea, or fruit juice.   Pay attention to hunger and fullness cues.  Stop eating once you feel satisfied; don't wait until you feel full, stuffed, or sick from eating.  Choose lean meats and low fat/fat free dairy products.  Choose foods high in fiber such as fruits, vegetables, and whole grains (brown rice, whole wheat pasta, whole wheat bread, etc.).  Limit foods with added sugar to <7 gm per serving.  Always eat in the kitchen/dining room.  Never eat in the bedroom or in front of the TV.      Orders Placed This Encounter  Procedures  . Pneumococcal polysaccharide vaccine 23-valent greater than or equal to 2yo subcutaneous/IM  . CBC with Differential/Platelet  . Comprehensive metabolic panel    Order Specific Question:   Has the patient fasted?    Answer:   No  . Hemoglobin A1c  . Lipid panel    Order Specific Question:   Has the patient fasted?    Answer:   No  . POCT urinalysis dipstick   Meds ordered this encounter  Medications  . atorvastatin (LIPITOR) 10 MG tablet    Sig: Take 1 tablet (10 mg total) by mouth daily at 6 PM.    Dispense:  90 tablet    Refill:  3  . apixaban (ELIQUIS) 5 MG TABS tablet    Sig: Take 1 tablet (5 mg total) by mouth 2 (two) times daily.    Dispense:  180 tablet    Refill:  3  . nitroGLYCERIN (NITROSTAT) 0.4 MG SL tablet    Sig: Place 1 tablet (0.4 mg total) under the tongue every 5 (five) minutes as needed for chest pain.    Dispense:  25 tablet    Refill:  6  .  ramipril (ALTACE) 10 MG capsule    Sig: Take 1 capsule  (10 mg total) by mouth daily.    Dispense:  90 capsule    Refill:  3    Return in about 6 months (around 08/26/2018) for follow-up chronic medical conditions.   Leatrice Parilla Elayne Guerin, M.D. Primary Care at [facility] previously [facility] [address] [place], [place]  [id number] [phone] phone [phone] fax

## 2018-02-23 ENCOUNTER — Other Ambulatory Visit: Payer: Self-pay

## 2018-02-23 ENCOUNTER — Ambulatory Visit (INDEPENDENT_AMBULATORY_CARE_PROVIDER_SITE_OTHER): Payer: Medicare HMO | Admitting: Family Medicine

## 2018-02-23 ENCOUNTER — Encounter: Payer: Self-pay | Admitting: Family Medicine

## 2018-02-23 VITALS — BP 128/70 | HR 69 | Temp 98.6°F | Resp 18 | Ht 69.88 in | Wt 291.6 lb

## 2018-02-23 DIAGNOSIS — I714 Abdominal aortic aneurysm, without rupture, unspecified: Secondary | ICD-10-CM

## 2018-02-23 DIAGNOSIS — Z0000 Encounter for general adult medical examination without abnormal findings: Secondary | ICD-10-CM

## 2018-02-23 DIAGNOSIS — I10 Essential (primary) hypertension: Secondary | ICD-10-CM

## 2018-02-23 DIAGNOSIS — Z8551 Personal history of malignant neoplasm of bladder: Secondary | ICD-10-CM

## 2018-02-23 DIAGNOSIS — I25118 Atherosclerotic heart disease of native coronary artery with other forms of angina pectoris: Secondary | ICD-10-CM

## 2018-02-23 DIAGNOSIS — R7989 Other specified abnormal findings of blood chemistry: Secondary | ICD-10-CM

## 2018-02-23 DIAGNOSIS — R945 Abnormal results of liver function studies: Secondary | ICD-10-CM | POA: Diagnosis not present

## 2018-02-23 DIAGNOSIS — Z23 Encounter for immunization: Secondary | ICD-10-CM

## 2018-02-23 DIAGNOSIS — I82592 Chronic embolism and thrombosis of other specified deep vein of left lower extremity: Secondary | ICD-10-CM

## 2018-02-23 DIAGNOSIS — R739 Hyperglycemia, unspecified: Secondary | ICD-10-CM | POA: Diagnosis not present

## 2018-02-23 DIAGNOSIS — R7309 Other abnormal glucose: Secondary | ICD-10-CM

## 2018-02-23 DIAGNOSIS — E7800 Pure hypercholesterolemia, unspecified: Secondary | ICD-10-CM | POA: Diagnosis not present

## 2018-02-23 LAB — POCT URINALYSIS DIP (MANUAL ENTRY)
BILIRUBIN UA: NEGATIVE
GLUCOSE UA: NEGATIVE mg/dL
Ketones, POC UA: NEGATIVE mg/dL
Nitrite, UA: POSITIVE — AB
Protein Ur, POC: NEGATIVE mg/dL
Spec Grav, UA: 1.015 (ref 1.010–1.025)
UROBILINOGEN UA: 0.2 U/dL
pH, UA: 6.5 (ref 5.0–8.0)

## 2018-02-23 MED ORDER — ATORVASTATIN CALCIUM 10 MG PO TABS: 10.0000 mg | ORAL_TABLET | Freq: Every day | ORAL | 3 refills | Status: AC

## 2018-02-23 MED ORDER — NITROGLYCERIN 0.4 MG SL SUBL: 0.4000 mg | SUBLINGUAL_TABLET | SUBLINGUAL | 6 refills | Status: AC | PRN

## 2018-02-23 MED ORDER — RAMIPRIL 10 MG PO CAPS: 10.0000 mg | ORAL_CAPSULE | Freq: Every day | ORAL | 3 refills | Status: DC

## 2018-02-23 MED ORDER — APIXABAN 5 MG PO TABS: 5.0000 mg | ORAL_TABLET | Freq: Two times a day (BID) | ORAL | 3 refills | Status: DC

## 2018-02-23 NOTE — Patient Instructions (Addendum)
   IF you received an x-ray today, you will receive an invoice from [facility]. Please contact [facility] at [phone] with questions or concerns regarding your invoice.   IF you received labwork today, you will receive an invoice from [facility]. Please contact [facility] at [phone] with questions or concerns regarding your invoice.   Our billing staff will not be able to assist you with questions regarding bills from these companies.  You will be contacted with the lab results as soon as they are available. The fastest way to get your results is to activate your My Chart account. Instructions are located on the last page of this paperwork. If you have not heard from us regarding the results in 2 weeks, please contact this office.      Preventive Care 65 Years and Older, Male Preventive care refers to lifestyle choices and visits with your health care provider that can promote health and wellness. What does preventive care include?  A yearly physical exam. This is also called an annual well check.  Dental exams once or twice a year.  Routine eye exams. Ask your health care provider how often you should have your eyes checked.  Personal lifestyle choices, including: ? Daily care of your teeth and gums. ? Regular physical activity. ? Eating a healthy diet. ? Avoiding tobacco and drug use. ? Limiting alcohol use. ? Practicing safe sex. ? Taking low doses of aspirin every day. ? Taking vitamin and mineral supplements as recommended by your health care provider. What happens during an annual well check? The services and screenings done by your health care provider during your annual well check will depend on your age, overall health, lifestyle risk factors, and family history of disease. Counseling Your health care provider may ask you questions about your:  Alcohol use.  Tobacco use.  Drug use.  Emotional well-being.  Home and relationship  well-being.  Sexual activity.  Eating habits.  History of falls.  Memory and ability to understand (cognition).  Work and work environment.  Screening You may have the following tests or measurements:  Height, weight, and BMI.  Blood pressure.  Lipid and cholesterol levels. These may be checked every 5 years, or more frequently if you are over 50 years old.  Skin check.  Lung cancer screening. You may have this screening every year starting at age 55 if you have a 30-pack-year history of smoking and currently smoke or have quit within the past 15 years.  Fecal occult blood test (FOBT) of the stool. You may have this test every year starting at age 50.  Flexible sigmoidoscopy or colonoscopy. You may have a sigmoidoscopy every 5 years or a colonoscopy every 10 years starting at age 50.  Prostate cancer screening. Recommendations will vary depending on your family history and other risks.  Hepatitis C blood test.  Hepatitis B blood test.  Sexually transmitted disease (STD) testing.  Diabetes screening. This is done by checking your blood sugar (glucose) after you have not eaten for a while (fasting). You may have this done every 1-3 years.  Abdominal aortic aneurysm (AAA) screening. You may need this if you are a current or former smoker.  Osteoporosis. You may be screened starting at age 70 if you are at high risk.  Talk with your health care provider about your test results, treatment options, and if necessary, the need for more tests. Vaccines Your health care provider may recommend certain vaccines, such as:  Influenza vaccine. This   is recommended every year.  Tetanus, diphtheria, and acellular pertussis (Tdap, Td) vaccine. You may need a Td booster every 10 years.  Varicella vaccine. You may need this if you have not been vaccinated.  Zoster vaccine. You may need this after age 60.  Measles, mumps, and rubella (MMR) vaccine. You may need at least one dose of  MMR if you were born in 1957 or later. You may also need a second dose.  Pneumococcal 13-valent conjugate (PCV13) vaccine. One dose is recommended after age 75.  Pneumococcal polysaccharide (PPSV23) vaccine. One dose is recommended after age 75.  Meningococcal vaccine. You may need this if you have certain conditions.  Hepatitis A vaccine. You may need this if you have certain conditions or if you travel or work in places where you may be exposed to hepatitis A.  Hepatitis B vaccine. You may need this if you have certain conditions or if you travel or work in places where you may be exposed to hepatitis B.  Haemophilus influenzae type b (Hib) vaccine. You may need this if you have certain risk factors.  Talk to your health care provider about which screenings and vaccines you need and how often you need them. This information is not intended to replace advice given to you by your health care provider. Make sure you discuss any questions you have with your health care provider. Document Released: 09/08/2015 Document Revised: 05/01/2016 Document Reviewed: 06/13/2015 Elsevier Interactive Patient Education  2018 Elsevier Inc.  

## 2018-02-24 LAB — COMPREHENSIVE METABOLIC PANEL
ALT: 55 IU/L — AB (ref 0–44)
AST: 56 IU/L — ABNORMAL HIGH (ref 0–40)
Albumin/Globulin Ratio: 1.7 (ref 1.2–2.2)
Albumin: 4.5 g/dL (ref 3.5–4.8)
Alkaline Phosphatase: 54 IU/L (ref 39–117)
BILIRUBIN TOTAL: 0.5 mg/dL (ref 0.0–1.2)
BUN/Creatinine Ratio: 12 (ref 10–24)
BUN: 9 mg/dL (ref 8–27)
CALCIUM: 8.9 mg/dL (ref 8.6–10.2)
CHLORIDE: 103 mmol/L (ref 96–106)
CO2: 22 mmol/L (ref 20–29)
Creatinine, Ser: 0.77 mg/dL (ref 0.76–1.27)
GFR, EST AFRICAN AMERICAN: 103 mL/min/{1.73_m2} (ref >59)
GFR, EST NON AFRICAN AMERICAN: 89 mL/min/{1.73_m2} (ref >59)
GLUCOSE: 114 mg/dL — AB (ref 65–99)
Globulin, Total: 2.7 g/dL (ref 1.5–4.5)
Potassium: 4.2 mmol/L (ref 3.5–5.2)
Sodium: 138 mmol/L (ref 134–144)
TOTAL PROTEIN: 7.2 g/dL (ref 6.0–8.5)

## 2018-02-24 LAB — CBC WITH DIFFERENTIAL/PLATELET
BASOS ABS: 0 10*3/uL (ref 0.0–0.2)
Basos: 1 %
EOS (ABSOLUTE): 0.2 10*3/uL (ref 0.0–0.4)
Eos: 3 %
Hematocrit: 40.2 % (ref 37.5–51.0)
Hemoglobin: 13.5 g/dL (ref 13.0–17.7)
IMMATURE GRANS (ABS): 0 10*3/uL (ref 0.0–0.1)
IMMATURE GRANULOCYTES: 0 %
LYMPHS: 34 %
Lymphocytes Absolute: 2.2 10*3/uL (ref 0.7–3.1)
MCH: 33.5 pg — ABNORMAL HIGH (ref 26.6–33.0)
MCHC: 33.6 g/dL (ref 31.5–35.7)
MCV: 100 fL — ABNORMAL HIGH (ref 79–97)
Monocytes Absolute: 0.6 10*3/uL (ref 0.1–0.9)
Monocytes: 9 %
NEUTROS PCT: 53 %
Neutrophils Absolute: 3.4 10*3/uL (ref 1.4–7.0)
PLATELETS: 260 10*3/uL (ref 150–450)
RBC: 4.03 x10E6/uL — ABNORMAL LOW (ref 4.14–5.80)
RDW: 14.9 % (ref 12.3–15.4)
WBC: 6.3 10*3/uL (ref 3.4–10.8)

## 2018-02-24 LAB — HEMOGLOBIN A1C
Est. average glucose Bld gHb Est-mCnc: 128 mg/dL
Hgb A1c MFr Bld: 6.1 % — ABNORMAL HIGH (ref 4.8–5.6)

## 2018-02-24 LAB — LIPID PANEL
CHOLESTEROL TOTAL: 127 mg/dL (ref 100–199)
Chol/HDL Ratio: 3.7 ratio (ref 0.0–5.0)
HDL: 34 mg/dL — ABNORMAL LOW (ref >39)
LDL CALC: 65 mg/dL (ref 0–99)
TRIGLYCERIDES: 141 mg/dL (ref 0–149)
VLDL Cholesterol Cal: 28 mg/dL (ref 5–40)

## 2018-03-07 ENCOUNTER — Other Ambulatory Visit: Payer: Self-pay | Admitting: Family Medicine

## 2018-03-07 DIAGNOSIS — I10 Essential (primary) hypertension: Secondary | ICD-10-CM

## 2018-03-12 DIAGNOSIS — G4733 Obstructive sleep apnea (adult) (pediatric): Secondary | ICD-10-CM | POA: Diagnosis not present

## 2018-03-19 ENCOUNTER — Encounter: Payer: Self-pay | Admitting: Family Medicine

## 2018-04-22 DIAGNOSIS — I25118 Atherosclerotic heart disease of native coronary artery with other forms of angina pectoris: Secondary | ICD-10-CM | POA: Diagnosis not present

## 2018-04-22 DIAGNOSIS — I82532 Chronic embolism and thrombosis of left popliteal vein: Secondary | ICD-10-CM | POA: Diagnosis not present

## 2018-04-22 DIAGNOSIS — I700 Atherosclerosis of aorta: Secondary | ICD-10-CM | POA: Diagnosis not present

## 2018-04-22 DIAGNOSIS — R7309 Other abnormal glucose: Secondary | ICD-10-CM | POA: Diagnosis not present

## 2018-04-22 DIAGNOSIS — Z8551 Personal history of malignant neoplasm of bladder: Secondary | ICD-10-CM | POA: Diagnosis not present

## 2018-04-22 DIAGNOSIS — I10 Essential (primary) hypertension: Secondary | ICD-10-CM | POA: Diagnosis not present

## 2018-05-21 DIAGNOSIS — Z8551 Personal history of malignant neoplasm of bladder: Secondary | ICD-10-CM | POA: Diagnosis not present

## 2018-05-21 DIAGNOSIS — N200 Calculus of kidney: Secondary | ICD-10-CM | POA: Diagnosis not present

## 2018-06-01 DIAGNOSIS — Z87442 Personal history of urinary calculi: Secondary | ICD-10-CM | POA: Diagnosis not present

## 2018-06-01 DIAGNOSIS — K760 Fatty (change of) liver, not elsewhere classified: Secondary | ICD-10-CM | POA: Diagnosis not present

## 2018-06-01 DIAGNOSIS — Z8551 Personal history of malignant neoplasm of bladder: Secondary | ICD-10-CM | POA: Diagnosis not present

## 2018-06-01 DIAGNOSIS — N281 Cyst of kidney, acquired: Secondary | ICD-10-CM | POA: Diagnosis not present

## 2018-06-10 DIAGNOSIS — G4733 Obstructive sleep apnea (adult) (pediatric): Secondary | ICD-10-CM | POA: Diagnosis not present

## 2018-07-09 DIAGNOSIS — Z936 Other artificial openings of urinary tract status: Secondary | ICD-10-CM | POA: Diagnosis not present

## 2018-07-30 ENCOUNTER — Telehealth: Payer: Self-pay | Admitting: Cardiovascular Disease

## 2018-07-30 NOTE — Telephone Encounter (Signed)
Pam,   Do you have any forms for him?

## 2018-07-30 NOTE — Telephone Encounter (Signed)
Spoke with patient and he states that he does not have part D insurance and does not need to meet deductible. Advised that I would try and fax his paperwork in first thing tomorrow and would give him a call back. He verbalized understanding with no further questions at this time.

## 2018-07-30 NOTE — Telephone Encounter (Signed)
Patient calling to check status of patient assistance forms for eliquis.  Please call with update.  He dropped off forms in office to be signed and sent for Eliquis .

## 2018-08-10 NOTE — Telephone Encounter (Signed)
Please call patient with Status update he states he needs this done he is running out of med

## 2018-08-17 MED ORDER — APIXABAN 5 MG PO TABS: 5.0000 mg | ORAL_TABLET | Freq: Two times a day (BID) | ORAL | 3 refills | Status: AC

## 2018-08-17 NOTE — Telephone Encounter (Signed)
°  Please call patient with Status update he states he needs this done he is running out of med      Patient wants a call today and would like an answer from Dr. Rockey Situ personally if possible.

## 2018-08-17 NOTE — Telephone Encounter (Signed)
Patient assistance form completed and faxed to [place] [phone]. Confirmation received.  The patient is aware this has been faxed to [facility] and confirmation received.  I have advised him that I can pull samples of Eliquis if needed.  The patient is in need of this. He is aware I will pull 2 weeks worth of samples and place them at the front desk for him.  Eliquis 5 mg Lot: [id number] Exp: 11/21 # 2 boxes given

## 2018-09-02 DIAGNOSIS — Z23 Encounter for immunization: Secondary | ICD-10-CM | POA: Diagnosis not present

## 2018-09-02 DIAGNOSIS — M25561 Pain in right knee: Secondary | ICD-10-CM | POA: Diagnosis not present

## 2018-09-02 DIAGNOSIS — R7309 Other abnormal glucose: Secondary | ICD-10-CM | POA: Diagnosis not present

## 2018-09-02 DIAGNOSIS — I82532 Chronic embolism and thrombosis of left popliteal vein: Secondary | ICD-10-CM | POA: Diagnosis not present

## 2018-09-02 DIAGNOSIS — E7800 Pure hypercholesterolemia, unspecified: Secondary | ICD-10-CM | POA: Diagnosis not present

## 2018-09-02 DIAGNOSIS — I714 Abdominal aortic aneurysm, without rupture: Secondary | ICD-10-CM | POA: Diagnosis not present

## 2018-09-02 DIAGNOSIS — I10 Essential (primary) hypertension: Secondary | ICD-10-CM | POA: Diagnosis not present

## 2018-09-02 DIAGNOSIS — I25118 Atherosclerotic heart disease of native coronary artery with other forms of angina pectoris: Secondary | ICD-10-CM | POA: Diagnosis not present

## 2018-09-02 DIAGNOSIS — Z8551 Personal history of malignant neoplasm of bladder: Secondary | ICD-10-CM | POA: Diagnosis not present

## 2018-09-02 DIAGNOSIS — L989 Disorder of the skin and subcutaneous tissue, unspecified: Secondary | ICD-10-CM | POA: Diagnosis not present

## 2018-09-08 DIAGNOSIS — G4733 Obstructive sleep apnea (adult) (pediatric): Secondary | ICD-10-CM | POA: Diagnosis not present

## 2018-09-10 DIAGNOSIS — C44519 Basal cell carcinoma of skin of other part of trunk: Secondary | ICD-10-CM | POA: Diagnosis not present

## 2018-09-10 DIAGNOSIS — L821 Other seborrheic keratosis: Secondary | ICD-10-CM | POA: Diagnosis not present

## 2018-09-10 DIAGNOSIS — D485 Neoplasm of uncertain behavior of skin: Secondary | ICD-10-CM | POA: Diagnosis not present

## 2018-09-14 DIAGNOSIS — M25561 Pain in right knee: Secondary | ICD-10-CM | POA: Diagnosis not present

## 2018-09-14 DIAGNOSIS — M1711 Unilateral primary osteoarthritis, right knee: Secondary | ICD-10-CM | POA: Diagnosis not present

## 2018-09-28 DIAGNOSIS — C44519 Basal cell carcinoma of skin of other part of trunk: Secondary | ICD-10-CM | POA: Diagnosis not present

## 2018-10-12 DIAGNOSIS — C44519 Basal cell carcinoma of skin of other part of trunk: Secondary | ICD-10-CM | POA: Diagnosis not present

## 2018-10-13 DIAGNOSIS — Z936 Other artificial openings of urinary tract status: Secondary | ICD-10-CM | POA: Diagnosis not present

## 2018-12-03 DIAGNOSIS — E7800 Pure hypercholesterolemia, unspecified: Secondary | ICD-10-CM | POA: Diagnosis not present

## 2018-12-03 DIAGNOSIS — I10 Essential (primary) hypertension: Secondary | ICD-10-CM | POA: Diagnosis not present

## 2018-12-03 DIAGNOSIS — I82532 Chronic embolism and thrombosis of left popliteal vein: Secondary | ICD-10-CM | POA: Diagnosis not present

## 2018-12-03 DIAGNOSIS — C4401 Basal cell carcinoma of skin of lip: Secondary | ICD-10-CM | POA: Diagnosis not present

## 2018-12-03 DIAGNOSIS — I714 Abdominal aortic aneurysm, without rupture: Secondary | ICD-10-CM | POA: Diagnosis not present

## 2018-12-03 DIAGNOSIS — M1711 Unilateral primary osteoarthritis, right knee: Secondary | ICD-10-CM | POA: Diagnosis not present

## 2018-12-03 DIAGNOSIS — R7309 Other abnormal glucose: Secondary | ICD-10-CM | POA: Diagnosis not present

## 2018-12-03 DIAGNOSIS — Z8551 Personal history of malignant neoplasm of bladder: Secondary | ICD-10-CM | POA: Diagnosis not present

## 2018-12-03 DIAGNOSIS — I25118 Atherosclerotic heart disease of native coronary artery with other forms of angina pectoris: Secondary | ICD-10-CM | POA: Diagnosis not present

## 2018-12-07 DIAGNOSIS — G4733 Obstructive sleep apnea (adult) (pediatric): Secondary | ICD-10-CM | POA: Diagnosis not present

## 2018-12-31 ENCOUNTER — Telehealth: Payer: Self-pay

## 2018-12-31 NOTE — Telephone Encounter (Signed)
Spoke with patient's wife to schedule overdue F/U with Dr. Rockey Situ. Wife states he is unable to reschedule at this time because they are traveling to [place] due to a death in their family. Wife states he will call back to schedule as soon as possible.

## 2019-01-12 DIAGNOSIS — Z936 Other artificial openings of urinary tract status: Secondary | ICD-10-CM | POA: Diagnosis not present

## 2019-02-09 DIAGNOSIS — R69 Illness, unspecified: Secondary | ICD-10-CM | POA: Diagnosis not present

## 2019-02-09 DIAGNOSIS — J301 Allergic rhinitis due to pollen: Secondary | ICD-10-CM | POA: Diagnosis not present

## 2019-02-09 DIAGNOSIS — J019 Acute sinusitis, unspecified: Secondary | ICD-10-CM | POA: Diagnosis not present

## 2019-02-09 DIAGNOSIS — B9689 Other specified bacterial agents as the cause of diseases classified elsewhere: Secondary | ICD-10-CM | POA: Diagnosis not present

## 2019-02-10 DIAGNOSIS — Z08 Encounter for follow-up examination after completed treatment for malignant neoplasm: Secondary | ICD-10-CM | POA: Diagnosis not present

## 2019-02-10 DIAGNOSIS — D2261 Melanocytic nevi of right upper limb, including shoulder: Secondary | ICD-10-CM | POA: Diagnosis not present

## 2019-02-10 DIAGNOSIS — D2271 Melanocytic nevi of right lower limb, including hip: Secondary | ICD-10-CM | POA: Diagnosis not present

## 2019-02-10 DIAGNOSIS — D225 Melanocytic nevi of trunk: Secondary | ICD-10-CM | POA: Diagnosis not present

## 2019-02-10 DIAGNOSIS — L821 Other seborrheic keratosis: Secondary | ICD-10-CM | POA: Diagnosis not present

## 2019-02-10 DIAGNOSIS — Z85828 Personal history of other malignant neoplasm of skin: Secondary | ICD-10-CM | POA: Diagnosis not present

## 2019-02-10 DIAGNOSIS — D2262 Melanocytic nevi of left upper limb, including shoulder: Secondary | ICD-10-CM | POA: Diagnosis not present

## 2019-02-10 DIAGNOSIS — D2272 Melanocytic nevi of left lower limb, including hip: Secondary | ICD-10-CM | POA: Diagnosis not present

## 2019-02-15 ENCOUNTER — Other Ambulatory Visit (INDEPENDENT_AMBULATORY_CARE_PROVIDER_SITE_OTHER): Payer: Medicare HMO

## 2019-02-16 ENCOUNTER — Ambulatory Visit (INDEPENDENT_AMBULATORY_CARE_PROVIDER_SITE_OTHER): Payer: Medicare HMO

## 2019-02-16 ENCOUNTER — Ambulatory Visit (INDEPENDENT_AMBULATORY_CARE_PROVIDER_SITE_OTHER): Payer: Medicare HMO | Admitting: Vascular Surgery

## 2019-02-16 ENCOUNTER — Other Ambulatory Visit (INDEPENDENT_AMBULATORY_CARE_PROVIDER_SITE_OTHER): Payer: Self-pay | Admitting: Vascular Surgery

## 2019-02-16 ENCOUNTER — Other Ambulatory Visit: Payer: Self-pay

## 2019-02-16 ENCOUNTER — Encounter (INDEPENDENT_AMBULATORY_CARE_PROVIDER_SITE_OTHER): Payer: Self-pay

## 2019-02-16 DIAGNOSIS — I714 Abdominal aortic aneurysm, without rupture, unspecified: Secondary | ICD-10-CM

## 2019-02-18 ENCOUNTER — Encounter (INDEPENDENT_AMBULATORY_CARE_PROVIDER_SITE_OTHER): Payer: Self-pay | Admitting: Vascular Surgery

## 2019-03-09 DIAGNOSIS — G4733 Obstructive sleep apnea (adult) (pediatric): Secondary | ICD-10-CM | POA: Diagnosis not present

## 2019-03-18 DIAGNOSIS — N3001 Acute cystitis with hematuria: Secondary | ICD-10-CM | POA: Diagnosis not present

## 2019-03-18 DIAGNOSIS — Z87448 Personal history of other diseases of urinary system: Secondary | ICD-10-CM | POA: Diagnosis not present

## 2019-03-18 DIAGNOSIS — J019 Acute sinusitis, unspecified: Secondary | ICD-10-CM | POA: Diagnosis not present

## 2019-10-04 NOTE — Progress Notes (Signed)
Cardiology Office Note  Date:  10/05/2019   ID:  Christian [name], DOB February 22, 1941, MRN [id number]  PCP:  Christian Honour, MD   Chief Complaint  Patient presents with  . other    12 month follow up. Meds reviewed by the pt. verbally. Pt. c/o chest pressure with left arm pain.     HPI:  Christian [name] is a very pleasant 79 year old gentleman with a history of  obesity , CAD,  moderate disease in a diagonal vessel, RCA and proximal circumflex, obtuse marginal branch with normal systolic function, diastolic dysfunction on echocardiogram,  DVT and PE, after first cancer operation at [facility] Bladder surgery,  obstructive sleep apnea, hypertension,  hyperlipidemia,  small AAA,  DVT April 2019 who presents for routine followup of his coronary artery disease, recurrent DVT  Concerned about price of eliquis Discussed various treatment options He is taking Eliquis 5 mg twice daily At this time only needs 2.5 twice daily Goes into the donut hole, 3 months will cost him $700  Having anginal sx, walking up from the workshop Sx for past 2 months No regular exercise program  Has urine ostomy Uses a disability placard  Given inconveniences Needs renewal of his disability DMV placard  No significant lower extremity edema  EKG personally reviewed by myself on todays visit Shows normal sinus rhythm rate 62 bpm T wave abnormality anterolateral leads V4 through V6  Other past medical history reviewed Hospitalization March 2019 with UTI hematuria and sepsis Diagnosed with Escherichia coli bacteremia Positive blood cultures positive for ecoli and enterobacter  CT scan November 17, 2017 with no PE  12/02/2017 developed swelling and pain Ultrasound left popliteal vein posterior tibial vein peroneal veins with DVT  Hospitalized for DVT April 2019 Treated with eliquis 5 twice a day   ER 12/01/2016 Urine darker, Pain in ABD from coughing CT scan of his abdomen showing Kidney stone, 2 mm  Hematuria, Possible urinary tract infection, placed on Bactrim 10 days  Last stress test was in 03/2008. He had an inferior wall defect consistent with ischemia. Significant GI uptake artifact noted in the inferior wall. EF 42%. No cardiac cath was performed at that time in follow up.  ECHO in 02/2008 shows normal EF, otherwise normal study   PMH:   has a past medical history of Aortic aneurysm [facility]), Bladder cancer ([place]), Coronary artery disease, DVT (deep venous thrombosis) ([place]) (10/07/2007), Glucose intolerance (impaired glucose tolerance), Hydrocele, Hyperlipidemia, Hypertension, MI (myocardial infarction) ([place]) (1997), Nephrolithiasis, and Sleep apnea.  PSH:    Past Surgical History:  Procedure Laterality Date  . [place]  . bladder removed    . CARDIAC CATHETERIZATION    . COLONOSCOPY  08/27/2011   normal.  [facility].  Marland Kitchen HYDROCELE EXCISION    . PROSTATE SURGERY     prostatectomy with bladder resection.    Current Outpatient Medications  Medication Sig Dispense Refill  . apixaban (ELIQUIS) 5 MG TABS tablet Take 1 tablet (5 mg total) by mouth 2 (two) times daily. 180 tablet 3  . atorvastatin (LIPITOR) 10 MG tablet Take 1 tablet (10 mg total) by mouth daily at 6 PM. 90 tablet 3  . fluticasone (FLONASE) 50 MCG/ACT nasal spray Place 2 sprays into both nostrils daily.    . nitroGLYCERIN (NITROSTAT) 0.4 MG SL tablet Place 1 tablet (0.4 mg total) under the tongue every 5 (five) minutes as needed for chest pain. 25 tablet 6  . Omega-3 Fatty  Acids (RA FISH OIL) 1000 MG CAPS Take 1,000 mg by mouth 2 (two) times daily.     . ramipril (ALTACE) 10 MG capsule Take 1 capsule (10 mg total) by mouth daily. 90 capsule 3   No current facility-administered medications for this visit.     Allergies:   Ceftriaxone, Other, Sulfasalazine, and Sulfa antibiotics   Social History:  The patient  reports that he has never smoked. He has never used smokeless tobacco. He  reports that he does not drink alcohol or use drugs.   Family History:   family history includes Cancer in his father; Diabetes in his brother, mother, and sister; Lung cancer in his father; Stroke in his mother.    Review of Systems: Review of Systems  Constitutional: Positive for malaise/fatigue.  Respiratory: Negative.   Cardiovascular: Negative.   Gastrointestinal: Negative.   Musculoskeletal: Negative.   Neurological: Negative.   Psychiatric/Behavioral: Negative.   All other systems reviewed and are negative.   PHYSICAL EXAM: VS:  BP 140/70 (BP Location: Left Arm, Patient Position: Sitting, Cuff Size: Large)   Pulse 73   Ht 5\' 10"  (1.778 m)   Wt 291 lb 4 oz (132.1 kg)   BMI 41.79 kg/m  , BMI Body mass index is 41.79 kg/m. Constitutional:  oriented to person, place, and time. No distress.  HENT:  Head: Grossly normal Eyes:  no discharge. No scleral icterus.  Neck: No JVD, no carotid bruits  Cardiovascular: Regular rate and rhythm, no murmurs appreciated Pulmonary/Chest: Clear to auscultation bilaterally, no wheezes or rails Abdominal: Soft.  no distension.  no tenderness.  Musculoskeletal: Normal range of motion Neurological:  normal muscle tone. Coordination normal. No atrophy Skin: Skin warm and dry Psychiatric: normal affect, pleasant   Recent Labs: No results found for requested labs within last 8760 hours.    Lipid Panel Lab Results  Component Value Date   CHOL 127 02/23/2018   HDL 34 (L) 02/23/2018   LDLCALC 65 02/23/2018   TRIG 141 02/23/2018      Wt Readings from Last 3 Encounters:  10/05/19 291 lb 4 oz (132.1 kg)  02/23/18 291 lb 9.6 oz (132.3 kg)  02/13/18 292 lb (132.5 kg)      ASSESSMENT AND PLAN:  Mixed hyperlipidemia - Plan: EKG 12-Lead Cholesterol is at goal on the current lipid regimen. No changes to the medications were made.  DVT: History of recurrent episodes Most recent episode after being in bed for UTI sepsis Documented on  ultrasound April 2019  Would continue Eliquis 2.5 twice daily  Recommend he call insurance company concerning price of Xarelto 10 mg daily  Atherosclerosis of native coronary artery of native heart with stable angina pectoris ([place]) - Plan: EKG 12-Lead He is concerned about chest tightness on exertion Concerning for angina Recommended pharmacologic Myoview, order placed and will be completed this week  OBESITY-MORBID (>100') - Plan: EKG 12-Lead We have encouraged continued exercise, careful diet management in an effort to lose weight.  Abdominal aortic aneurysm (AAA) without rupture (HCC) - Followed by vascular, stable  Aortic atherosclerosis (HCC) CT scan reviewed showing moderate distal aortic and iliac arterial disease Coronary calcifications Cholesterol at goal   Total encounter time more than 25 minutes  Greater than 50% was spent in counseling and coordination of care with the patient   Disposition:   F/U  12 months   No orders of the defined types were placed in this encounter.    Mila Merry, M.D., Ph.D. 10/05/2019  [place], [place]

## 2019-10-05 ENCOUNTER — Encounter: Payer: Self-pay | Admitting: Cardiovascular Disease

## 2019-10-05 ENCOUNTER — Other Ambulatory Visit: Payer: Self-pay

## 2019-10-05 ENCOUNTER — Ambulatory Visit: Payer: Medicare HMO | Admitting: Cardiovascular Disease

## 2019-10-05 VITALS — BP 140/70 | HR 73 | Ht 70.0 in | Wt 291.2 lb

## 2019-10-05 DIAGNOSIS — I714 Abdominal aortic aneurysm, without rupture, unspecified: Secondary | ICD-10-CM

## 2019-10-05 DIAGNOSIS — I25118 Atherosclerotic heart disease of native coronary artery with other forms of angina pectoris: Secondary | ICD-10-CM | POA: Diagnosis not present

## 2019-10-05 DIAGNOSIS — I700 Atherosclerosis of aorta: Secondary | ICD-10-CM

## 2019-10-05 DIAGNOSIS — I82599 Chronic embolism and thrombosis of other specified deep vein of unspecified lower extremity: Secondary | ICD-10-CM

## 2019-10-05 DIAGNOSIS — E7800 Pure hypercholesterolemia, unspecified: Secondary | ICD-10-CM

## 2019-10-05 NOTE — Patient Instructions (Addendum)
   Medication Instructions:  Check the price of xarelto 10 mg with insurance   If you need a refill on your cardiac medications before your next appointment, please call your pharmacy.    Lab work: No new labs needed   If you have labs (blood work) drawn today and your tests are completely normal, you will receive your results only by: Marland Kitchen MyChart Message (if you have MyChart) OR . A paper copy in the mail If you have any lab test that is abnormal or we need to change your treatment, we will call you to review the results.   Testing/Procedures: [place] Call for CAD, angina  [place]  Your caregiver has ordered a Stress Test with nuclear imaging. The purpose of this test is to evaluate the blood supply to your heart muscle. This procedure is referred to as a "Non-Invasive Stress Test." This is because other than having an IV started in your vein, nothing is inserted or "invades" your body. Cardiac stress tests are done to find areas of poor blood flow to the heart by determining the extent of coronary artery disease (CAD). Some patients exercise on a treadmill, which naturally increases the blood flow to your heart, while others who are  unable to walk on a treadmill due to physical limitations have a pharmacologic/chemical stress agent called Lexiscan . This medicine will mimic walking on a treadmill by temporarily increasing your coronary blood flow.   Please note: these test may take anywhere between 2-4 hours to complete  PLEASE REPORT TO [place] AT THE FIRST DESK WILL DIRECT YOU WHERE TO GO  Date of Procedure:_____________________________________  Arrival Time for Procedure:______________________________  PLEASE NOTIFY THE OFFICE AT LEAST 24 HOURS IN ADVANCE IF YOU ARE UNABLE TO KEEP YOUR APPOINTMENT.  [phone] AND  PLEASE NOTIFY NUCLEAR MEDICINE AT [facility] AT LEAST 24 HOURS IN ADVANCE IF YOU ARE UNABLE TO KEEP YOUR APPOINTMENT.  [phone]  How to prepare for your Myoview test:  1. Do not eat or drink after midnight 2. No caffeine for 24 hours prior to test 3. No smoking 24 hours prior to test. 4. Your medication may be taken with water.  If your doctor stopped a medication because of this test, do not take that medication. 5. Ladies, please do not wear dresses.  Skirts or pants are appropriate. Please wear a short sleeve shirt. 6. No perfume, cologne or lotion. 7. Wear comfortable walking shoes. No heels!     Follow-Up: At [facility], you and your health needs are our priority.  As part of our continuing mission to provide you with exceptional heart care, we have created designated Provider Care Teams.  These Care Teams include your primary Cardiologist (physician) and Advanced Practice Providers (APPs -  Physician Assistants and Nurse Practitioners) who all work together to provide you with the care you need, when you need it.  . You will need a follow up appointment in 12 months  . Providers on your designated Care Team:   . Murray Hodgkins, NP . Christell Faith, PA-C . Marrianne Mood, PA-C  Any Other Special Instructions Will Be Listed Below (If Applicable).  For educational health videos Log in to : [email or web address] Or : [email or web address], password : [email or web address]

## 2019-10-08 ENCOUNTER — Other Ambulatory Visit: Payer: Self-pay

## 2019-10-08 ENCOUNTER — Encounter
Admission: RE | Admit: 2019-10-08 | Discharge: 2019-10-08 | Disposition: A | Discharge status: Home / Self Care | Payer: Medicare HMO | Source: Ambulatory Visit | Attending: Cardiovascular Disease | Admitting: Cardiovascular Disease

## 2019-10-08 DIAGNOSIS — I25118 Atherosclerotic heart disease of native coronary artery with other forms of angina pectoris: Secondary | ICD-10-CM | POA: Diagnosis not present

## 2019-10-08 LAB — NM MYOCAR MULTI W/SPECT W/WALL MOTION / EF
LV dias vol: 110 mL (ref 62–150)
LV sys vol: 35 mL
Rest HR: 64 {beats}/min
SDS: 0
SRS: 13
SSS: 9
TID: 0.97

## 2019-10-08 MED ORDER — TECHNETIUM TC 99M TETROFOSMIN IV KIT
34.1710 | PACK | Freq: Once | INTRAVENOUS | Status: AC | PRN
  Administered 2019-10-08: 34.171 via INTRAVENOUS

## 2019-10-08 MED ORDER — TECHNETIUM TC 99M TETROFOSMIN IV KIT
10.0000 | PACK | Freq: Once | INTRAVENOUS | Status: AC | PRN
  Administered 2019-10-08: 11.36 via INTRAVENOUS

## 2019-10-08 MED ORDER — REGADENOSON 0.4 MG/5ML IV SOLN
0.4000 mg | Freq: Once | INTRAVENOUS | Status: AC
  Administered 2019-10-08: 0.4 mg via INTRAVENOUS

## 2020-02-04 ENCOUNTER — Encounter: Payer: Self-pay | Admitting: Urology

## 2020-02-04 ENCOUNTER — Other Ambulatory Visit: Payer: Self-pay

## 2020-02-04 ENCOUNTER — Ambulatory Visit: Payer: Medicare HMO | Admitting: Urology

## 2020-02-04 VITALS — BP 187/84 | HR 79 | Ht 71.0 in | Wt 289.0 lb

## 2020-02-04 DIAGNOSIS — Z8551 Personal history of malignant neoplasm of bladder: Secondary | ICD-10-CM | POA: Diagnosis not present

## 2020-02-04 DIAGNOSIS — N200 Calculus of kidney: Secondary | ICD-10-CM | POA: Diagnosis not present

## 2020-02-04 NOTE — Progress Notes (Signed)
02/04/2020 10:41 AM   Christian [name] Jan 24, 1941 [id number]  Referring provider: [name], MD [place],  [place] [id number]  Chief Complaint  Patient presents with  . Other    Urologic history: 1.  History bladder cancer -[id number] -Last imaging CTU 11/2016 punctate left renal calculi, right lower pole renal cyst -RUS 05/2018; no hydronephrosis   HPI: Christian [name] is a 79 y.o. male who presents to establish local urologic care.  -History of bladder cancer status post radical cystoprostatectomy 2007 in [facility] -Followed by Dr. Jacqlyn Larsen for several years -Last saw Dr. Wynetta Emery at [facility] September 2019 -Presently denies flank/abdominal pain -Passes an occasional small stone  PMH: Past Medical History:  Diagnosis Date  . Aortic aneurysm ([place])    followed by vascular surgery yearly.  . Bladder cancer [facility])    s/p bladder resection. [place]  . Coronary artery disease    AMI anterior wall 1997; s/p PTCA to LAD 11/1995 [facility].  Marland Kitchen DVT (deep venous thrombosis) ([place]) 10/07/2007  . Glucose intolerance (impaired glucose tolerance)   . Hydrocele   . Hyperlipidemia   . Hypertension   . MI (myocardial infarction) ([place]) 1997  . Nephrolithiasis   . Sleep apnea     Surgical History: Past Surgical History:  Procedure Laterality Date  . [place]  . bladder removed    . CARDIAC CATHETERIZATION    . COLONOSCOPY  08/27/2011   normal.  [facility].  Marland Kitchen HYDROCELE EXCISION    . PROSTATE SURGERY     prostatectomy with bladder resection.    Home Medications:  Allergies as of 02/04/2020      Reactions   Ceftriaxone Hives   Other Hives   Sulfasalazine Hives   Sulfa Antibiotics Hives      Medication List       Accurate as of February 04, 2020 10:41 AM. If you have any questions, ask your nurse or doctor.        apixaban 5 MG Tabs tablet Commonly known as: Eliquis Take 1  tablet (5 mg total) by mouth 2 (two) times daily.   atorvastatin 10 MG tablet Commonly known as: LIPITOR Take 1 tablet (10 mg total) by mouth daily at 6 PM.   fluticasone 50 MCG/ACT nasal spray Commonly known as: FLONASE Place 2 sprays into both nostrils daily.   nitroGLYCERIN 0.4 MG SL tablet Commonly known as: NITROSTAT Place 1 tablet (0.4 mg total) under the tongue every 5 (five) minutes as needed for chest pain.   RA Fish Oil 1000 MG Caps Take 1,000 mg by mouth 2 (two) times daily.   ramipril 10 MG capsule Commonly known as: ALTACE Take 1 capsule (10 mg total) by mouth daily.       Allergies:  Allergies  Allergen Reactions  . Ceftriaxone Hives  . Other Hives  . Sulfasalazine Hives  . Sulfa Antibiotics Hives    Family History: Family History  Problem Relation Age of Onset  . Stroke Mother   . Diabetes Mother   . Lung cancer Father   . Cancer Father        lung cancer  . Diabetes Sister   . Diabetes Brother     Social History:  reports that he has never smoked. He has never used smokeless tobacco. He reports that he does not drink alcohol and does not use drugs.   Physical Exam: BP (!) 187/84  Pulse 79   Ht 5\' 11"  (1.803 m)   Wt 289 lb (131.1 kg)   BMI 40.31 kg/m   Constitutional:  Alert and oriented, No acute distress. HEENT: [place] AT, moist mucus membranes.  Trachea midline, no masses. Cardiovascular: No clubbing, cyanosis, or edema. Respiratory: Normal respiratory effort, no increased work of breathing. GI: Abdomen is soft, nontender, stoma pink, urine clear Neurologic: Grossly intact, no focal deficits, moving all 4 extremities. Psychiatric: Normal mood and affect.   Assessment & Plan:    1. History of bladder cancer -Cystoprostatectomy with ileal conduit 2007 -NCCN guidelines monitoring >10 years for nonmuscle invasive disease: Imaging as needed and annual B12  2.  Nephrolithiasis -Recommend periodic KUB/RUS   Abbie Sons,  MD  [facility] [address], [place], [place] [address] [phone]

## 2020-02-05 ENCOUNTER — Encounter: Payer: Self-pay | Admitting: Urology

## 2020-02-18 ENCOUNTER — Other Ambulatory Visit: Payer: Self-pay

## 2020-02-18 ENCOUNTER — Ambulatory Visit (INDEPENDENT_AMBULATORY_CARE_PROVIDER_SITE_OTHER): Payer: Medicare HMO

## 2020-02-18 ENCOUNTER — Ambulatory Visit (INDEPENDENT_AMBULATORY_CARE_PROVIDER_SITE_OTHER): Payer: Medicare HMO | Admitting: Vascular Surgery

## 2020-02-18 ENCOUNTER — Encounter (INDEPENDENT_AMBULATORY_CARE_PROVIDER_SITE_OTHER): Payer: Self-pay | Admitting: Vascular Surgery

## 2020-02-18 VITALS — BP 158/77 | HR 69 | Resp 16 | Wt 287.6 lb

## 2020-02-18 DIAGNOSIS — I10 Essential (primary) hypertension: Secondary | ICD-10-CM

## 2020-02-18 DIAGNOSIS — I714 Abdominal aortic aneurysm, without rupture, unspecified: Secondary | ICD-10-CM

## 2020-02-18 DIAGNOSIS — E7800 Pure hypercholesterolemia, unspecified: Secondary | ICD-10-CM | POA: Diagnosis not present

## 2020-02-18 NOTE — Progress Notes (Signed)
MRN : [id number]  Christian [name] is a 79 y.o. (04/28/1941) male who presents with chief complaint of  Chief Complaint  Patient presents with   Follow-up    ultrasound follow up  .  History of Present Illness: Patient returns today in follow up of his abdominal aortic aneurysm.  He has a lot of other health issues going on right now, but no aneurysm related symptoms. Specifically, the patient denies new back or abdominal pain, or signs of peripheral embolization.  His aortic duplex today shows no growth in his abdominal aortic aneurysm which actually measures slightly less than 3 cm today which is what his measured on the last couple of visits.  Current Outpatient Medications  Medication Sig Dispense Refill   apixaban (ELIQUIS) 5 MG TABS tablet Take 1 tablet (5 mg total) by mouth 2 (two) times daily. 180 tablet 3   atorvastatin (LIPITOR) 10 MG tablet Take 1 tablet (10 mg total) by mouth daily at 6 PM. 90 tablet 3   fluticasone (FLONASE) 50 MCG/ACT nasal spray Place 2 sprays into both nostrils as needed.      nitroGLYCERIN (NITROSTAT) 0.4 MG SL tablet Place 1 tablet (0.4 mg total) under the tongue every 5 (five) minutes as needed for chest pain. 25 tablet 6   Omega-3 Fatty Acids (RA FISH OIL) 1000 MG CAPS Take 1,000 mg by mouth 2 (two) times daily.      ramipril (ALTACE) 10 MG capsule Take 1 capsule (10 mg total) by mouth daily. 90 capsule 3   No current facility-administered medications for this visit.    Past Medical History:  Diagnosis Date   Aortic aneurysm ([place])    followed by vascular surgery yearly.   Bladder cancer [facility])    s/p bladder resection. [facility]   Coronary artery disease    AMI anterior wall 1997; s/p PTCA to LAD 11/1995 [facility].   DVT (deep venous thrombosis) (HCC) 10/07/2007   Glucose intolerance (impaired glucose tolerance)    Hydrocele    Hyperlipidemia    Hypertension    MI (myocardial infarction) ([place]) 1997   Nephrolithiasis    Sleep apnea      Past Surgical History:  Procedure Laterality Date   [place]   bladder removed     CARDIAC CATHETERIZATION     COLONOSCOPY  08/27/2011   normal.  [facility].   HYDROCELE EXCISION     PROSTATE SURGERY     prostatectomy with bladder resection.     Social History   Tobacco Use   Smoking status: Never Smoker   Smokeless tobacco: Never Used   Tobacco comment: tobacco use- no   Vaping Use   Vaping Use: Never used  Substance Use Topics   Alcohol use: No   Drug use: No       Family History  Problem Relation Age of Onset   Stroke Mother    Diabetes Mother    Lung cancer Father    Cancer Father        lung cancer   Diabetes Sister    Diabetes Brother      Allergies  Allergen Reactions   Ceftriaxone Hives   Other Hives   Sulfasalazine Hives   Sulfa Antibiotics Hives     REVIEW OF SYSTEMS (Negative unless checked)  Constitutional: [] ?Weight loss  [] ?Fever  [] ?Chills Cardiac: [] ?Chest pain   [] ?Chest pressure   [] ?Palpitations   [] ?Shortness of breath when laying flat   [] ?Shortness  of breath at rest   [] ?Shortness of breath with exertion. Vascular:  [] ?Pain in legs with walking   [] ?Pain in legs at rest   [] ?Pain in legs when laying flat   [] ?Claudication   [] ?Pain in feet when walking  [] ?Pain in feet at rest  [] ?Pain in feet when laying flat   [x] ?History of DVT   [] ?Phlebitis   [] ?Swelling in legs   [] ?Varicose veins   [] ?Non-healing ulcers Pulmonary:   [] ?Uses home oxygen   [] ?Productive cough   [] ?Hemoptysis   [] ?Wheeze  [] ?COPD   [] ?Asthma Neurologic:  [] ?Dizziness  [] ?Blackouts   [] ?Seizures   [] ?History of stroke   [] ?History of TIA  [] ?Aphasia   [] ?Temporary blindness   [] ?Dysphagia   [] ?Weakness or numbness in arms   [] ?Weakness or numbness in legs Musculoskeletal:  [x] ?Arthritis   [] ?Joint swelling   [] ?Joint pain   [] ?Low back pain Hematologic:  [] ?Easy bruising  [] ?Easy bleeding   [] ?Hypercoagulable  state   [] ?Anemic   Gastrointestinal:  [] ?Blood in stool   [] ?Vomiting blood  [] ?Gastroesophageal reflux/heartburn   [] ?Abdominal pain Genitourinary:  [x] ?Chronic kidney disease   [] ?Difficult urination  [] ?Frequent urination  [] ?Burning with urination   [] ?Hematuria Skin:  [] ?Rashes   [] ?Ulcers   [] ?Wounds Psychological:  [] ?History of anxiety   [] ? History of major depression.   Physical Examination  BP (!) 158/77 (BP Location: Right Arm)    Pulse 69    Resp 16    Wt 287 lb 9.6 oz (130.5 kg)    BMI 40.11 kg/m  Gen:  WD/WN, NAD Head: [place]/AT, No temporalis wasting. Ear/Nose/Throat: Hearing grossly intact, nares w/o erythema or drainage Eyes: Conjunctiva clear. Sclera non-icteric Neck: Supple.  Trachea midline Pulmonary:  Good air movement, no use of accessory muscles.  Cardiac: RRR, no JVD Vascular:  Vessel Right Left  Radial Palpable Palpable                                   Gastrointestinal: soft, non-tender/non-distended. No guarding/reflex.  Aorta not palpable due to body habitus Musculoskeletal: M/S 5/5 throughout.  No deformity or atrophy.  Neurologic: Sensation grossly intact in extremities.  Symmetrical.  Speech is fluent.  Psychiatric: Judgment intact, Mood & affect appropriate for pt's clinical situation. Dermatologic: No rashes or ulcers noted.  No cellulitis or open wounds.       Labs No results found for this or any previous visit (from the past 2160 hour(s)).  Radiology No results found.  Assessment/Plan Hyperlipidemia lipid control important in reducing the progression of atherosclerotic disease. Continue statin therapy   Bladder cancer S/p resection by Urology and doing well.  Abdominal aortic aneurysm His aortic duplex today shows no growth in his abdominal aortic aneurysm which actually measures slightly less than 3 cm today which is what his measured on the last couple of visits.  No role for intervention at that level.  Continue to  follow annually.    Leotis Pain, MD  02/18/2020 10:26 AM    This note was created with Dragon medical transcription system.  Any errors from dictation are purely unintentional

## 2020-09-15 ENCOUNTER — Other Ambulatory Visit: Payer: Self-pay | Admitting: Family Medicine

## 2020-09-15 DIAGNOSIS — R059 Cough, unspecified: Secondary | ICD-10-CM

## 2020-09-15 DIAGNOSIS — J0101 Acute recurrent maxillary sinusitis: Secondary | ICD-10-CM

## 2020-09-15 DIAGNOSIS — D72829 Elevated white blood cell count, unspecified: Secondary | ICD-10-CM

## 2020-09-19 ENCOUNTER — Other Ambulatory Visit (HOSPITAL_COMMUNITY): Payer: Self-pay | Admitting: Family Medicine

## 2020-09-19 ENCOUNTER — Other Ambulatory Visit: Payer: Self-pay | Admitting: Family Medicine

## 2020-09-19 ENCOUNTER — Ambulatory Visit: Payer: Medicare HMO

## 2020-09-19 DIAGNOSIS — R1013 Epigastric pain: Secondary | ICD-10-CM

## 2020-09-19 DIAGNOSIS — D72829 Elevated white blood cell count, unspecified: Secondary | ICD-10-CM

## 2020-09-22 ENCOUNTER — Other Ambulatory Visit: Payer: Self-pay

## 2020-09-22 ENCOUNTER — Ambulatory Visit
Admission: RE | Admit: 2020-09-22 | Discharge: 2020-09-22 | Disposition: A | Discharge status: Home / Self Care | Payer: Medicare HMO | Source: Ambulatory Visit | Attending: Family Medicine | Admitting: Family Medicine

## 2020-09-22 DIAGNOSIS — J342 Deviated nasal septum: Secondary | ICD-10-CM | POA: Insufficient documentation

## 2020-09-22 DIAGNOSIS — I700 Atherosclerosis of aorta: Secondary | ICD-10-CM | POA: Diagnosis not present

## 2020-09-22 DIAGNOSIS — Z8551 Personal history of malignant neoplasm of bladder: Secondary | ICD-10-CM | POA: Diagnosis not present

## 2020-09-22 DIAGNOSIS — J439 Emphysema, unspecified: Secondary | ICD-10-CM | POA: Insufficient documentation

## 2020-09-22 DIAGNOSIS — D72829 Elevated white blood cell count, unspecified: Secondary | ICD-10-CM | POA: Insufficient documentation

## 2020-09-22 DIAGNOSIS — I2510 Atherosclerotic heart disease of native coronary artery without angina pectoris: Secondary | ICD-10-CM | POA: Insufficient documentation

## 2020-09-22 DIAGNOSIS — R059 Cough, unspecified: Secondary | ICD-10-CM | POA: Insufficient documentation

## 2020-09-22 DIAGNOSIS — J0101 Acute recurrent maxillary sinusitis: Secondary | ICD-10-CM | POA: Diagnosis present

## 2020-09-25 ENCOUNTER — Ambulatory Visit: Admission: RE | Admit: 2020-09-25 | Payer: Medicare HMO | Source: Ambulatory Visit

## 2020-09-25 ENCOUNTER — Other Ambulatory Visit: Payer: Self-pay | Admitting: Family Medicine

## 2020-09-25 DIAGNOSIS — N2889 Other specified disorders of kidney and ureter: Secondary | ICD-10-CM

## 2020-09-26 ENCOUNTER — Other Ambulatory Visit: Payer: Self-pay | Admitting: Family Medicine

## 2020-09-26 ENCOUNTER — Ambulatory Visit
Admission: RE | Admit: 2020-09-26 | Discharge: 2020-09-26 | Disposition: A | Discharge status: Home / Self Care | Payer: Medicare HMO | Source: Ambulatory Visit | Attending: Family Medicine | Admitting: Family Medicine

## 2020-09-26 ENCOUNTER — Other Ambulatory Visit: Payer: Self-pay

## 2020-09-26 DIAGNOSIS — N2889 Other specified disorders of kidney and ureter: Secondary | ICD-10-CM | POA: Diagnosis present

## 2020-09-26 DIAGNOSIS — D72829 Elevated white blood cell count, unspecified: Secondary | ICD-10-CM

## 2020-09-26 DIAGNOSIS — J0101 Acute recurrent maxillary sinusitis: Secondary | ICD-10-CM

## 2020-09-26 DIAGNOSIS — R059 Cough, unspecified: Secondary | ICD-10-CM

## 2020-09-26 MED ORDER — IOHEXOL 300 MG/ML  SOLN
100.0000 mL | Freq: Once | INTRAMUSCULAR | Status: AC | PRN
  Administered 2020-09-26: 100 mL via INTRAVENOUS

## 2020-09-28 ENCOUNTER — Ambulatory Visit: Payer: Medicare HMO

## 2020-09-28 ENCOUNTER — Ambulatory Visit
Admission: RE | Admit: 2020-09-28 | Discharge: 2020-09-28 | Disposition: A | Discharge status: Home / Self Care | Payer: Medicare HMO | Source: Ambulatory Visit | Attending: Family Medicine | Admitting: Family Medicine

## 2020-09-28 ENCOUNTER — Other Ambulatory Visit: Payer: Self-pay

## 2020-09-28 DIAGNOSIS — R1013 Epigastric pain: Secondary | ICD-10-CM | POA: Diagnosis present

## 2020-09-28 DIAGNOSIS — D72829 Elevated white blood cell count, unspecified: Secondary | ICD-10-CM | POA: Diagnosis present

## 2020-09-29 ENCOUNTER — Telehealth: Payer: Self-pay | Admitting: Cardiovascular Disease

## 2020-09-29 ENCOUNTER — Encounter: Payer: Self-pay | Admitting: Oncology

## 2020-09-29 ENCOUNTER — Inpatient Hospital Stay: Payer: Medicare HMO | Attending: Oncology | Admitting: Oncology

## 2020-09-29 ENCOUNTER — Telehealth: Payer: Self-pay

## 2020-09-29 ENCOUNTER — Inpatient Hospital Stay: Payer: Medicare HMO

## 2020-09-29 VITALS — BP 124/85 | HR 93 | Temp 99.0°F | Resp 16 | Ht 68.5 in | Wt 269.8 lb

## 2020-09-29 DIAGNOSIS — Z7901 Long term (current) use of anticoagulants: Secondary | ICD-10-CM | POA: Insufficient documentation

## 2020-09-29 DIAGNOSIS — E278 Other specified disorders of adrenal gland: Secondary | ICD-10-CM

## 2020-09-29 DIAGNOSIS — Z8551 Personal history of malignant neoplasm of bladder: Secondary | ICD-10-CM | POA: Diagnosis not present

## 2020-09-29 DIAGNOSIS — R590 Localized enlarged lymph nodes: Secondary | ICD-10-CM | POA: Diagnosis not present

## 2020-09-29 DIAGNOSIS — Z7189 Other specified counseling: Secondary | ICD-10-CM

## 2020-09-29 DIAGNOSIS — D093 Carcinoma in situ of thyroid and other endocrine glands: Secondary | ICD-10-CM | POA: Insufficient documentation

## 2020-09-29 DIAGNOSIS — R634 Abnormal weight loss: Secondary | ICD-10-CM

## 2020-09-29 DIAGNOSIS — Z86718 Personal history of other venous thrombosis and embolism: Secondary | ICD-10-CM | POA: Insufficient documentation

## 2020-09-29 DIAGNOSIS — Z801 Family history of malignant neoplasm of trachea, bronchus and lung: Secondary | ICD-10-CM | POA: Insufficient documentation

## 2020-09-29 DIAGNOSIS — D72829 Elevated white blood cell count, unspecified: Secondary | ICD-10-CM | POA: Diagnosis not present

## 2020-09-29 LAB — LACTATE DEHYDROGENASE: LDH: 182 U/L (ref 98–192)

## 2020-09-29 LAB — CBC WITH DIFFERENTIAL/PLATELET
Abs Immature Granulocytes: 0.35 10*3/uL — ABNORMAL HIGH (ref 0.00–0.07)
Basophils Absolute: 0.1 10*3/uL (ref 0.0–0.1)
Basophils Relative: 0 %
Eosinophils Absolute: 0.3 10*3/uL (ref 0.0–0.5)
Eosinophils Relative: 1 %
HCT: 37.6 % — ABNORMAL LOW (ref 39.0–52.0)
Hemoglobin: 13.1 g/dL (ref 13.0–17.0)
Immature Granulocytes: 2 %
Lymphocytes Relative: 11 %
Lymphs Abs: 2.7 10*3/uL (ref 0.7–4.0)
MCH: 35.1 pg — ABNORMAL HIGH (ref 26.0–34.0)
MCHC: 34.8 g/dL (ref 30.0–36.0)
MCV: 100.8 fL — ABNORMAL HIGH (ref 80.0–100.0)
Monocytes Absolute: 1.6 10*3/uL — ABNORMAL HIGH (ref 0.1–1.0)
Monocytes Relative: 7 %
Neutro Abs: 19 10*3/uL — ABNORMAL HIGH (ref 1.7–7.7)
Neutrophils Relative %: 79 %
Platelets: 336 10*3/uL (ref 150–400)
RBC: 3.73 MIL/uL — ABNORMAL LOW (ref 4.22–5.81)
RDW: 14 % (ref 11.5–15.5)
WBC: 24.1 10*3/uL — ABNORMAL HIGH (ref 4.0–10.5)
nRBC: 0 % (ref 0.0–0.2)

## 2020-09-29 LAB — COMPREHENSIVE METABOLIC PANEL
ALT: 17 U/L (ref 0–44)
AST: 22 U/L (ref 15–41)
Albumin: 4 g/dL (ref 3.5–5.0)
Alkaline Phosphatase: 84 U/L (ref 38–126)
Anion gap: 10 (ref 5–15)
BUN: 12 mg/dL (ref 8–23)
CO2: 26 mmol/L (ref 22–32)
Calcium: 9.2 mg/dL (ref 8.9–10.3)
Chloride: 95 mmol/L — ABNORMAL LOW (ref 98–111)
Creatinine, Ser: 0.82 mg/dL (ref 0.61–1.24)
GFR, Estimated: >60 mL/min (ref >60)
Glucose, Bld: 115 mg/dL — ABNORMAL HIGH (ref 70–99)
Potassium: 4.2 mmol/L (ref 3.5–5.1)
Sodium: 131 mmol/L — ABNORMAL LOW (ref 135–145)
Total Bilirubin: 0.4 mg/dL (ref 0.3–1.2)
Total Protein: 8.8 g/dL — ABNORMAL HIGH (ref 6.5–8.1)

## 2020-09-29 LAB — HEPATITIS PANEL, ACUTE
HCV Ab: NONREACTIVE
Hep A IgM: NONREACTIVE
Hep B C IgM: NONREACTIVE
Hepatitis B Surface Ag: NONREACTIVE

## 2020-09-29 LAB — PROTIME-INR
INR: 1.1 (ref 0.8–1.2)
Prothrombin Time: 14 seconds (ref 11.4–15.2)

## 2020-09-29 LAB — APTT: aPTT: 27 seconds (ref 24–36)

## 2020-09-29 LAB — HIV ANTIBODY (ROUTINE TESTING W REFLEX): HIV Screen 4th Generation wRfx: NONREACTIVE

## 2020-09-29 NOTE — Progress Notes (Signed)
Hematology/Oncology Consult note [facility] Telephone:([phone] [phone]   Patient Care Team: [name], MD as PCP - General (Family Medicine) Minna Merritts, MD as PCP - Cardiology (Cardiology)  REFERRING PROVIDER: [name], MD  CHIEF COMPLAINTS/REASON FOR VISIT:  Evaluation of adrenal mass  HISTORY OF PRESENTING ILLNESS:   Christian [name] is a  80 y.o.  male with PMH listed below was seen in consultation at the request of  [name], MD  for evaluation of adrenal mass   09/22/2020, patient underwent a CT chest without contrast for persistent coughing .  CT showed interval development of oral at least 8.8 cm right upper abdomen hypodense mass lesion that may arise from the right kidney and is abutting the liver and inferior vena cava.  Retroperitoneal lymphadenopathy. Mild paraseptal and at least moderate centrilobular emphysema test changes.  Diffuse mild bronchial wall thickening that could represent smoking-related bronchiolitis.  Three-vessel severe coronary artery calcifications.  Aortic valve leaflet calcification.  Aortic atherosclerosis and emphysema. 09/26/2020, patient underwent a CT abdomen pelvis with and without contrast for further evaluation There is a contrast enhancing mass centered about the right adrenal gland measuring 7.3 x 5.2 cm which appears to be separated from the adjacent superior pole of the right kidney by a subtle fat plane and is very closely abutting the posterior right lobe of the liver.  Normal right adrenal gland is not visualized.  Masses of uncertain nature. Multiple enlarged adjacent right retroperitoneal, celiac axis, crural lymph node consistent with nodal metastasis. Redemonstrated postoperative findings of cystoprostatectomy in the right lower quadrant ileal conduit urinary diversion.  No evidence of malignant recurrence in the pelvis.  Nonobstructive right nephrolithiasis.  CAD.  Aortic  atherosclerosis  Patient was referred to establish care at the cancer center for further evaluation and management. Patient reports for the 20 pounds unintentional weight loss and feeling very weak and fatigued for the past few months.  Appetite is decreased.  He eats about 50% of the portion he used to eat.  Denies any night sweating a low-grade fever.  He was accompanied by his wife. Denies any shortness of breath, chest pain, abdominal pain. Patient has a history of DVT, he is on Eliquis 5 mg twice daily. Patient has a history of bladder cancer status post radical cystoprostatectomy in 2007 by Dr. [place] Crane at [facility].pTis N0.  Was last seen by Dr. Bernardo Heater approximately 6 months ago.  Reviewed patient's previous blood work done at primary care provider's office.  Patient has had new onset of leukocytosis since December 2021.  Persistently elevated.  Review of Systems  Constitutional: Positive for appetite change, fatigue and unexpected weight change. Negative for chills and fever.  HENT:   Negative for hearing loss and voice change.   Eyes: Negative for eye problems and icterus.  Respiratory: Negative for chest tightness, cough and shortness of breath.   Cardiovascular: Negative for chest pain and leg swelling.  Gastrointestinal: Negative for abdominal distention and abdominal pain.  Endocrine: Negative for hot flashes.  Genitourinary: Negative for difficulty urinating, dysuria and frequency.   Musculoskeletal: Negative for arthralgias.  Skin: Negative for itching and rash.  Neurological: Negative for light-headedness and numbness.  Hematological: Negative for adenopathy. Does not bruise/bleed easily.  Psychiatric/Behavioral: Negative for confusion.    MEDICAL HISTORY:  Past Medical History:  Diagnosis Date  . Aortic aneurysm ([place])    followed by vascular surgery yearly.  . Bladder cancer ([place])  s/p bladder resection. [place]  . Coronary artery disease    AMI  anterior wall 1997; s/p PTCA to LAD 11/1995 [facility].  Marland Kitchen DVT (deep venous thrombosis) ([place]) 10/07/2007  . Glucose intolerance (impaired glucose tolerance)   . Hydrocele   . Hyperlipidemia   . Hypertension   . MI (myocardial infarction) ([place]) 1997  . Nephrolithiasis   . Sleep apnea     SURGICAL HISTORY: Past Surgical History:  Procedure Laterality Date  . [place]  . bladder removed    . CARDIAC CATHETERIZATION    . COLONOSCOPY  08/27/2011   normal.  [facility].  Marland Kitchen HYDROCELE EXCISION    . PROSTATE SURGERY     prostatectomy with bladder resection.    SOCIAL HISTORY: Social History   Socioeconomic History  . Marital status: Married    Spouse name: Not on file  . Number of children: Not on file  . Years of education: Not on file  . Highest education level: Not on file  Occupational History  . Occupation: retired  Tobacco Use  . Smoking status: Never Smoker  . Smokeless tobacco: Never Used  . Tobacco comment: tobacco use- no   Vaping Use  . Vaping Use: Never used  Substance and Sexual Activity  . Alcohol use: No  . Drug use: No  . Sexual activity: Yes  Other Topics Concern  . Not on file  Social History Narrative   Marital status:  Married x 28 years; happily married; third marriage.      Children: none; 2 stepdaughters; 3 grandchildren.  3 gg.      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      Tobacco:  None      Alcohol:  None; socially      Drugs: none     Exercise: Regularly exercise/walking; walking daily.  No formal exercise plan in 2018.      ADLs: independent with ADLs.   Drives; no assistant device.      Advanced Directives:  Yes; FULL CODE.  No prolonged measures.      Seatbelt: 100%         Social Determinants of Radio broadcast assistant Strain: Not on file  Food Insecurity: Not on file  Transportation Needs: Not on file  Physical Activity: Not on file  Stress: Not on file  Social Connections: Not  on file  Intimate Partner Violence: Not on file    FAMILY HISTORY: Family History  Problem Relation Age of Onset  . Stroke Mother   . Diabetes Mother   . Lung cancer Father   . Cancer Father        lung cancer  . Diabetes Sister   . Diabetes Brother     ALLERGIES:  is allergic to ceftriaxone, other, sulfasalazine, and sulfa antibiotics.  MEDICATIONS:  Current Outpatient Medications  Medication Sig Dispense Refill  . apixaban (ELIQUIS) 5 MG TABS tablet Take 1 tablet (5 mg total) by mouth 2 (two) times daily. 180 tablet 3  . atorvastatin (LIPITOR) 10 MG tablet Take 1 tablet (10 mg total) by mouth daily at 6 PM. 90 tablet 3  . azelastine (ASTELIN) 0.1 % nasal spray Place into the nose.    . fluticasone (FLONASE) 50 MCG/ACT nasal spray Place 2 sprays into both nostrils as needed.     . nitroGLYCERIN (NITROSTAT) 0.4 MG SL tablet Place 1 tablet (0.4 mg total) under the tongue every 5 (  five) minutes as needed for chest pain. 25 tablet 6  . Omega-3 Fatty Acids (RA FISH OIL) 1000 MG CAPS Take 1,000 mg by mouth 2 (two) times daily.     Marland Kitchen omeprazole (PRILOSEC) 20 MG capsule Take by mouth.    . ramipril (ALTACE) 10 MG capsule Take 1 capsule (10 mg total) by mouth daily. 90 capsule 3   No current facility-administered medications for this visit.     PHYSICAL EXAMINATION: ECOG PERFORMANCE STATUS: 1 - Symptomatic but completely ambulatory Vitals:   09/29/20 1122  BP: 124/85  Pulse: 93  Resp: 16  Temp: 99 F (37.2 C)   Filed Weights   09/29/20 1122  Weight: 269 lb 12.8 oz (122.4 kg)    Physical Exam Constitutional:      General: He is not in acute distress.    Comments: Patient walks independently.  HENT:     Head: Normocephalic and atraumatic.  Eyes:     General: No scleral icterus. Cardiovascular:     Rate and Rhythm: Normal rate and regular rhythm.     Heart sounds: Normal heart sounds.  Pulmonary:     Effort: Pulmonary effort is normal. No respiratory distress.      Breath sounds: No wheezing.  Abdominal:     General: Bowel sounds are normal. There is no distension.     Palpations: Abdomen is soft.  Musculoskeletal:        General: No deformity. Normal range of motion.     Cervical back: Normal range of motion and neck supple.  Skin:    General: Skin is warm and dry.     Findings: No erythema or rash.  Neurological:     Mental Status: He is alert and oriented to person, place, and time. Mental status is at baseline.     Cranial Nerves: No cranial nerve deficit.     Coordination: Coordination normal.  Psychiatric:        Mood and Affect: Mood normal.     LABORATORY DATA:  I have reviewed the data as listed Lab Results  Component Value Date   WBC 24.1 (H) 09/29/2020   HGB 13.1 09/29/2020   HCT 37.6 (L) 09/29/2020   MCV 100.8 (H) 09/29/2020   PLT 336 09/29/2020   Recent Labs    09/29/20 1224  NA 131*  K 4.2  CL 95*  CO2 26  GLUCOSE 115*  BUN 12  CREATININE 0.82  CALCIUM 9.2  GFRNONAA >60  PROT 8.8*  ALBUMIN 4.0  AST 22  ALT 17  ALKPHOS 84  BILITOT 0.4   Iron/TIBC/Ferritin/ %Sat    Component Value Date/Time   IRON 93 03/01/2015 1106   FERRITIN 196 03/01/2015 1106      RADIOGRAPHIC STUDIES: I have personally reviewed the radiological images as listed and agreed with the findings in the report. CT ABDOMEN PELVIS W WO CONTRAST  Result Date: 09/26/2020 CLINICAL DATA:  Characterize renal lesion identified by CT, history of bladder malignancy EXAM: CT ABDOMEN AND PELVIS WITHOUT AND WITH CONTRAST TECHNIQUE: Multidetector CT imaging of the abdomen and pelvis was performed following the standard protocol before and following the bolus administration of intravenous contrast. CONTRAST:  15mL OMNIPAQUE IOHEXOL 300 MG/ML  SOLN COMPARISON:  CT chest, 09/22/2020, CT abdomen pelvis, 12/01/2016 FINDINGS: Lower chest: No acute abnormality.  Coronary artery calcifications. Hepatobiliary: No solid liver abnormality is seen. No gallstones,  gallbladder wall thickening, or biliary dilatation. Pancreas: Unremarkable. No pancreatic ductal dilatation or surrounding inflammatory changes. Spleen: Normal  in size without significant abnormality. Adrenals/Urinary Tract: There is a contrast enhancing mass centered about the right adrenal gland, measuring 7.3 x 5.2 cm, which appears to be separated from the adjacent superior pole of the right kidney by a subtle fat plane (series 15, image 42) and is very closely abutting the posterior right lobe of the liver. The normal right adrenal gland is not visualized. The left adrenal gland is normal. The remaining portions of the kidneys are normal. No hydronephrosis. Tiny nonobstructive inferior pole calculus of the right kidney. Redemonstrated postoperative findings status post cystoprostatectomy and right lower quadrant ileal conduit urinary diversion. Stomach/Bowel: Stomach is within normal limits. Status post ileal conduit urinary diversion. No evidence of bowel wall thickening, distention, or inflammatory changes. Sigmoid diverticula. Vascular/Lymphatic: Aortic atherosclerosis. There are multiple enlarged adjacent right retroperitoneal, celiac axis, and crural lymph nodes measuring up to 2.1 x 1.6 cm (series 15, image 64). Reproductive: No mass or other significant abnormality. Other: No abdominal wall hernia or abnormality. No abdominopelvic ascites. Musculoskeletal: No acute or significant osseous findings. IMPRESSION: 1. There is a contrast enhancing mass centered about the right adrenal gland, measuring 7.3 x 5.2 cm, which appears to be separated from the adjacent superior pole of the right kidney by a subtle fat plane and is very closely abutting the posterior right lobe of the liver. The normal right adrenal gland is not visualized. This mass is of uncertain nature, and differential considerations include metastatic disease from known bladder malignancy, renal cell carcinoma, or adrenal cortical carcinoma.  Consider multi disciplinary referral and tissue sampling. 2. There are multiple enlarged adjacent right retroperitoneal, celiac axis, and crural lymph nodes, consistent with nodal metastatic disease. 3. Redemonstrated postoperative findings status post cystoprostatectomy and right lower quadrant ileal conduit urinary diversion. No evidence of malignant recurrence in the pelvis. 4. Nonobstructive right nephrolithiasis. 5. Coronary artery disease. Aortic Atherosclerosis (ICD10-I70.0). These results will be called to the ordering clinician or representative by the Radiologist Assistant, and communication documented in the PACS or [employer]. Electronically Signed   By: Eddie Candle M.D.   On: 09/26/2020 15:25   CT CHEST WO CONTRAST  Result Date: 09/22/2020 CLINICAL DATA:  C/o sinus drainage and productive cough x72mos, extreme fatigue x70mos, denies fever, states elevated WBC in recent labs, has had several rounds of antibiotics with no relief, denies hx of covid. History of aortic aneurysm abdominal, urinary bladder malignancy. EXAM: CT CHEST WITHOUT CONTRAST TECHNIQUE: Multidetector CT imaging of the chest was performed following the standard protocol without IV contrast. COMPARISON:  CT angiography chest 11/17/2017, CT renal 12/01/2016 FINDINGS: Cardiovascular: Normal heart size. No significant pericardial effusion. The thoracic aorta is normal in caliber. Aortic root and likely aortic leaflet calcifications. Mild atherosclerotic plaque of the thoracic aorta. Severe three-vessel coronary artery calcifications. Mediastinum/Nodes: Prominent/borderline enlarged 1 cm precarinal lymph node (13:60). No enlarged axillary lymph nodes. No gross hilar adenopathy, noting limited sensitivity for the detection of hilar adenopathy on this noncontrast study. Thyroid gland, trachea, and esophagus demonstrate no significant findings. Lungs/Pleura: Moderate centrilobular emphysematous changes. Mild paraseptal emphysematous  changes. Diffuse mild bronchial wall thickening that is worth skin bilateral lower lobes. Stable multiple scattered calcified and noncalcified pulmonary micronodules with as an example: Right upper lobe (14:51), left upper lobe (14:63), left lower lobe (14:86). Left lower lobe linear and subsegmental atelectasis. Similar findings within the right lower lobe. Upper Abdomen: Atherosclerotic plaque of the visualized abdominal aorta. Interval development of a hypodense mass lesion measuring at least up to 8.8  cm that may be arising from the right kidney and located just anterior to Morison's pouch. The lesion may be inseparable from the kidney, liver, inferior vena cava. Several other stellate lesions within the retroperitoneum in the aortocaval region may represent lymph nodes (13:158). Musculoskeletal: Trace bilateral gynecomastia. No suspicious lytic or blastic osseous lesions. No acute displaced fracture. Multilevel degenerative changes of the spine. IMPRESSION: 1. Interval development of an at least 8.8cm right upper abdomen hypodense mass lesion that may be arising from the right kidney and is abutting the liver and inferior vena cava. Suggestion of associated retroperitoneal lymphadenopathy. Findings concerning for malignancy. Recommend CT urogram for further evaluation in a patient with history of urinary bladder malignancy. 2. Mild paraseptal and at least moderate centrilobular emphysematous changes. 3. Diffuse mild bronchial wall thickening that could represent smoking-related bronchiolitis. 4. Stable multiple scattered calcified and noncalcified pulmonary micronodules. 5. Three vessel severe coronary artery calcifications. 6. Suggestion of aortic valve leaflet calcifications. Correlate with aortic stenosis. 7. Aortic Atherosclerosis (ICD10-I70.0) and Emphysema (ICD10-J43.9). These results will be called to the ordering clinician or representative by the Radiologist Assistant, and communication documented in  the PACS or [employer]. Electronically Signed   By: Iven Finn M.D.   On: 09/22/2020 23:14   CT MAXILLOFACIAL WO CONTRAST  Result Date: 09/22/2020 CLINICAL DATA:  Sinus drainage and cough EXAM: CT MAXILLOFACIAL WITHOUT CONTRAST TECHNIQUE: Multidetector CT imaging of the maxillofacial structures was performed. Multiplanar CT image reconstructions were also generated. COMPARISON:  None. FINDINGS: Osseous: No fracture or mandibular dislocation. No destructive process. Orbits: Negative. No traumatic or inflammatory finding. Sinuses: Postsurgical changes of bilateral ethmoidectomies, maxillary antrostomies and uncinectomies and superior and middle turbinectomies. Mild leftward deviation of the nasal septum with mild septal mucosal thickening. Paranasal sinuses are clear. Sinus drainage pathways are patent. Soft tissues: Negative. Limited intracranial: No significant or unexpected finding. IMPRESSION: 1. Postsurgical changes of the paranasal sinuses with patent sinus drainage pathways. 2. Mild leftward deviation of the nasal septum with mild septal mucosal thickening. Electronically Signed   By: Ulyses Jarred M.D.   On: 09/22/2020 23:49   US ABDOMEN LIMITED RUQ (LIVER/GB)  Result Date: 09/28/2020 CLINICAL DATA:  Dyspnea.  Leukocytosis. EXAM: ULTRASOUND ABDOMEN LIMITED RIGHT UPPER QUADRANT COMPARISON:  CT dated 09/26/2020 FINDINGS: Gallbladder: No gallstones or wall thickening visualized. No sonographic Murphy sign noted by sonographer. Common bile duct: Diameter: 4 mm. Liver: No focal lesion identified. Within normal limits in parenchymal echogenicity. Portal vein is patent on color Doppler imaging with normal direction of blood flow towards the liver. Other: Again noted is a right adrenal mass that is hypoechoic and heterogeneous measuring 5.7 x 7.7 x 4.9 cm. IMPRESSION: 1. No acute abnormality. No evidence for cholelithiasis or acute cholecystitis. 2. Again noted is a right adrenal mass better  visualized on the patient's prior CT. Electronically Signed   By: Constance Holster M.D.   On: 09/28/2020 15:59      ASSESSMENT & PLAN:  1. Adrenal mass ([place])   2. Loss of weight   3. Retroperitoneal lymphadenopathy   4. Leukocytosis, unspecified type   Adrenal mass and retroperitoneal lymphadenopathy in the context of weight loss and fatigue. #CT images were independently reviewed by me and discussed with patient.  I also discussed with urology Dr. Bernardo Heater via secure chat. Discussed with patient that the adrenal mass and retroperitoneal lymphadenopathy are concerning for malignant process.  Differential includes metastatic disease from bladder cancer, RCC, adrenal neoplasm, or other infiltrative process such as lymphoma.  Other etiologies include pheochromocytoma, etc. patient has had constitutional symptoms including loss of weight and decreased appetite, fatigue.  I recommend patient to proceed with CT-guided biopsy for either retroperitoneum lymphadenopathy versus adrenal mass to establish tissue diagnosis.  I will check PT and PTT, urine fractionated catecholamine and metanephrine level.  Check CBC and CMP.  New onset of leukocytosis, persist predominantly monocytosis as well as neutrophilia I will check flow cytometry, LDH, HIV, hepatitis panel. CBC showed increased immature granulocyte Orders Placed This Encounter  Procedures  . CT Biopsy    Standing Status:   Future    Standing Expiration Date:   09/29/2021    Order Specific Question:   Lab orders requested (DO NOT place separate lab orders, these will be automatically ordered during procedure specimen collection):    Answer:   Surgical Pathology    Order Specific Question:   Reason for Exam (SYMPTOM  OR DIAGNOSIS REQUIRED)    Answer:   biopsy of adrenal mass or retriperitoneal lymph node    Order Specific Question:   Preferred location?    Answer:   [facility]  . Metanephrines, urine, 24 hour    Standing Status:   Future     Standing Expiration Date:   09/29/2021  . Metanephrines, plasma    Standing Status:   Future    Number of Occurrences:   1    Standing Expiration Date:   09/29/2021  . Catecholamines, fractionated, urine, 24 hour    Standing Status:   Future    Standing Expiration Date:   09/29/2021  . CBC with Differential/Platelet    Standing Status:   Future    Number of Occurrences:   1    Standing Expiration Date:   09/29/2021  . Comprehensive metabolic panel    Standing Status:   Future    Number of Occurrences:   1    Standing Expiration Date:   09/29/2021  . Lactate dehydrogenase    Standing Status:   Future    Number of Occurrences:   1    Standing Expiration Date:   09/29/2021  . Flow cytometry panel-leukemia/lymphoma work-up  . HIV Antibody (routine testing w rflx)    Standing Status:   Future    Number of Occurrences:   1    Standing Expiration Date:   09/29/2021  . Hepatitis panel, acute    Standing Status:   Future    Number of Occurrences:   1    Standing Expiration Date:   09/29/2021  . Protime-INR    Standing Status:   Future    Number of Occurrences:   1    Standing Expiration Date:   09/29/2021  . APTT    Standing Status:   Future    Number of Occurrences:   1    Standing Expiration Date:   09/29/2021    All questions were answered. The patient knows to call the clinic with any problems questions or concerns.  cc [name], MD    Return of visit: To be determined. Thank you for this kind referral and the opportunity to participate in the care of this patient. A copy of today's note is routed to referring provider    Earlie Server, MD, PhD Hematology Oncology [facility] Pager- [phone] 09/29/2020

## 2020-09-29 NOTE — Telephone Encounter (Signed)
   [facility] Pre-operative Risk Assessment    HEARTCARE STAFF: - Please ensure there is not already an duplicate clearance open for this procedure. - Under Visit Info/Reason for Call, type in Other and utilize the format Clearance MM/DD/YY or Clearance TBD. Do not use dashes or single digits. - If request is for dental extraction, please clarify the # of teeth to be extracted.  Request for surgical clearance:  1. What type of surgery is being performed? Biopsy of adrenal mass   2. When is this surgery scheduled? ASAP  3. What type of clearance is required (medical clearance vs. Pharmacy clearance to hold med vs. Both)? both  4. Are there any medications that need to be held prior to surgery and how long? Hold Eliquis 2 days before and may continue Eliquis 24 hours after procedure  5. Practice name and name of physician performing surgery? [facility] - Dr Tasia Catchings  6. What is the office phone number? [phone]   7.   What is the office fax number? [phone]  8.   Anesthesia type (None, local, MAC, general) ? Not listed    Christian [name] 09/29/2020, 3:28 PM  _________________________________________________________________   (provider comments below)

## 2020-09-29 NOTE — Telephone Encounter (Signed)
Patient with diagnosis of DVT/PE on Eliquis for anticoagulation.    Procedure: adrenal mass biopsy Date of procedure: ASAP   CrCl 130.6 (69.6 with IBW) Platelet count 336  Per office protocol, patient can hold Eliquis for 2 days prior to procedure.   Patient will not need bridging with Lovenox (enoxaparin) around procedure.  If not bridging, patient should restart Eliquis as soon as possible afterward,

## 2020-09-29 NOTE — Telephone Encounter (Signed)
   Primary Cardiologist: Ida Rogue, MD  Chart reviewed as part of pre-operative protocol coverage. As documented below, Christian [name] would be at acceptable risk for the planned procedure without further cardiovascular testing.   Per pharmacy recommendations, patient can hold eliquis 2 days prior to his upcoming procedure with plans to restart as soon as he is cleared to do so by his surgeon.   I will route this recommendation to the requesting party via Epic fax function and remove from pre-op pool.  Please call with questions.  Abigail Butts, PA-C 09/29/2020, 4:24 PM

## 2020-09-29 NOTE — Telephone Encounter (Signed)
   Primary Cardiologist: Ida Rogue, MD  Chart reviewed as part of pre-operative protocol coverage. Patient was contacted 09/29/2020 in reference to pre-operative risk assessment for pending surgery as outlined below.  Christian [name] was last seen on 10/05/19 by Dr. Rockey Situ.  Since that day, Christian [name] has done well from a cardiac standpoint. He was staying quite active up until 07/2020 when they discovered the mass on his adrenal gland. He has had some generalized fatigue/weakness since then. That being said he can still easily complete 4 METs without anginal complaints.   Therefore, based on ACC/AHA guidelines, the patient would be at acceptable risk for the planned procedure without further cardiovascular testing.   The patient was advised that if he develops new symptoms prior to surgery to contact our office to arrange for a follow-up visit, and he verbalized understanding.  Will await pharmacy recommendations for input on holding eliquis prior to surgery to finalize this preop request.   Abigail Butts, PA-C 09/29/2020, 3:52 PM

## 2020-09-29 NOTE — Telephone Encounter (Signed)
Request for biopsy of adrenal mass has been faxed to specialized scheduling.   Request for anticoagulation clearance for biopsy has been faxed to Dr. Donivan Scull office. (fax: [phone])

## 2020-09-29 NOTE — Progress Notes (Signed)
New patient evaluation.   

## 2020-10-01 DIAGNOSIS — D093 Carcinoma in situ of thyroid and other endocrine glands: Secondary | ICD-10-CM | POA: Diagnosis not present

## 2020-10-02 ENCOUNTER — Telehealth: Payer: Self-pay

## 2020-10-02 ENCOUNTER — Other Ambulatory Visit: Payer: Self-pay

## 2020-10-02 DIAGNOSIS — E278 Other specified disorders of adrenal gland: Secondary | ICD-10-CM

## 2020-10-02 NOTE — Telephone Encounter (Signed)
Message received from [facility] lab:  Patient completed a 24-Hour urine for Metanephrines and Catecholamines. He placed all urine in the same 24 hour container, but the Catecholamines test requires a special jug so the main lab is going to cancel that test. They will be able to run the Metanephrines test. What do you want to do about the Catecholamines test? Do you want the patient to recollect?    Confirmed with Dr. Tasia Catchings and lab notified that she would like to collect specimen for the Catecholamines test.  Lab notify patient the need for another urine collection when they obtain the correct container either from our main lab or it may need to come directly from [facility].

## 2020-10-03 LAB — COMP PANEL: LEUKEMIA/LYMPHOMA: CLINICAL INFO: 4

## 2020-10-03 NOTE — Telephone Encounter (Signed)
10/02/20 Anticoagulation clearance in EPIC. Christian [name] with specials scheduling notified. Per Christian [name]: I did see that. I am waiting on a lab that the IR Dr wants to see for this patient. It was the one sent to [facility] and still in process.

## 2020-10-04 ENCOUNTER — Telehealth: Payer: Self-pay | Admitting: *Deleted

## 2020-10-04 ENCOUNTER — Other Ambulatory Visit: Payer: Self-pay

## 2020-10-04 ENCOUNTER — Telehealth: Payer: Self-pay

## 2020-10-04 ENCOUNTER — Telehealth: Payer: Self-pay | Admitting: Oncology

## 2020-10-04 DIAGNOSIS — E278 Other specified disorders of adrenal gland: Secondary | ICD-10-CM

## 2020-10-04 NOTE — Telephone Encounter (Signed)
Error

## 2020-10-04 NOTE — Telephone Encounter (Signed)
Per Marcie Bal, IR doctor is waiting for plasma metanephrines results prior to biopsy approval. Contacted labs and there was an error in collection. Pt will come back for recollection of lab tomorrow. He will get scheduled for biopsy as soon as those results come back and he is approved. Pt notified.

## 2020-10-04 NOTE — Telephone Encounter (Signed)
Please see phone note on 2/4. Will contact pt once biopsy is scheduled.

## 2020-10-04 NOTE — Telephone Encounter (Signed)
Patient called asking if his biopsy has been scheduled yet and would like a return call

## 2020-10-04 NOTE — Telephone Encounter (Signed)
Patient has called triage this morning already. Thanks

## 2020-10-04 NOTE — Telephone Encounter (Signed)
Pt left message inquiring about the status of his biopsy appt. Would like a return call when scheduled.

## 2020-10-05 ENCOUNTER — Telehealth: Payer: Self-pay

## 2020-10-05 ENCOUNTER — Inpatient Hospital Stay: Payer: Medicare HMO

## 2020-10-05 DIAGNOSIS — E278 Other specified disorders of adrenal gland: Secondary | ICD-10-CM

## 2020-10-05 DIAGNOSIS — D093 Carcinoma in situ of thyroid and other endocrine glands: Secondary | ICD-10-CM | POA: Diagnosis not present

## 2020-10-05 NOTE — Telephone Encounter (Signed)
Patient called asking about Flow cytometry labs results that he had seen in Mychart, wanting to know what it means and if it was concerning.   Per Dr. Tasia Catchings: flowcytometry showed no super concerning feature, just a small group of cells that needs to be watched. I will explain during next visit.   Patient notified and voiced understanding. I let him know that we will contact him back with biopsy date and MD follow up appt.

## 2020-10-06 ENCOUNTER — Ambulatory Visit: Payer: Medicare HMO

## 2020-10-09 ENCOUNTER — Other Ambulatory Visit: Payer: Self-pay

## 2020-10-09 DIAGNOSIS — D093 Carcinoma in situ of thyroid and other endocrine glands: Secondary | ICD-10-CM | POA: Diagnosis not present

## 2020-10-09 DIAGNOSIS — E278 Other specified disorders of adrenal gland: Secondary | ICD-10-CM

## 2020-10-09 LAB — METANEPHRINES, URINE, 24 HOUR
Metaneph Total, Ur: 57 ug/L
Metanephrines, 24H Ur: 140 ug/24 hr (ref 58–276)
Normetanephrine, 24H Ur: 642 ug/24 hr (ref 156–729)
Normetanephrine, Ur: 262 ug/L
Total Volume: 2450

## 2020-10-10 LAB — METANEPHRINES, PLASMA
Metanephrine, Free: 17.4 pg/mL (ref 0.0–88.0)
Normetanephrine, Free: 218.2 pg/mL (ref 0.0–285.2)

## 2020-10-11 ENCOUNTER — Telehealth: Payer: Self-pay

## 2020-10-11 NOTE — Telephone Encounter (Signed)
Called [facility] and they specimen was received on 2/13 with a turnaround time for results within 4-7 days.

## 2020-10-11 NOTE — Telephone Encounter (Signed)
-----   Message from Earlie Server, MD sent at 10/10/2020 10:47 PM EST ----- Most tests are back and normal. Can you check the time frame of the last test collected on 2/14, estimate of report date. Check with IR to see if a biopsy can be scheduled after the estimate report date.

## 2020-10-11 NOTE — Telephone Encounter (Signed)
error 

## 2020-10-12 NOTE — Telephone Encounter (Signed)
Specialty scheduling can not schedule at this time due to IR MD requesting results first.

## 2020-10-13 ENCOUNTER — Telehealth: Payer: Self-pay | Admitting: *Deleted

## 2020-10-13 NOTE — Telephone Encounter (Signed)
Can someone please update the patient about this?

## 2020-10-13 NOTE — Telephone Encounter (Signed)
Dr. Tasia Catchings has talked to Mr. Strebel and IR. They have to wait for labs to result in order to schedule biopsy. I have called Elmyra Ricks at [facility] and gave her Dr. Collie Siad number for Dr. Tamala Julian to call her.

## 2020-10-13 NOTE — Telephone Encounter (Signed)
Can you get me Dr.Smith's phone number?  thanks

## 2020-10-13 NOTE — Telephone Encounter (Signed)
Dr. Tasia Catchings would you like to contact IR MD to discuss?  Specialty scheduling states that the IR doctor is waiting for plasma metanephrines results prior to biopsy approval.   Confirmed with [facility] that the specimen was received on 2/13 with turn around time of 4-7 days for results.

## 2020-10-13 NOTE — Telephone Encounter (Signed)
Christian [name] from patient PCP office Dr Tamala Julian called asking about the biopsy that patient needs and has not been scheduled for. Dr Tamala Julian is asking if she can do something to expedite this being scheduled and they request a return call.

## 2020-10-16 LAB — MISC LABCORP TEST (SEND OUT): Labcorp test code: 4176

## 2020-10-16 LAB — METANEPHRINES, PLASMA
Metanephrine, Free: 14.3 pg/mL (ref 0.0–88.0)
Normetanephrine, Free: 105.8 pg/mL (ref 0.0–285.2)

## 2020-10-17 NOTE — Telephone Encounter (Signed)
Labs results have been completed. Specials scheduling notified. Waiting on biopsy date.

## 2020-10-17 NOTE — Telephone Encounter (Signed)
Patient has been scheduled for CT biopsy on 3/2 @9am  with arrival time of 8am. Follow up with MD scheduled on 3/9 @ 2:45. He is to take last eliquis dose on sunday 2/27 and hold on Monday and Tues, he may resume on 3/3.  Patient has been informed of appt details and Eliquis intructions.

## 2020-10-24 ENCOUNTER — Other Ambulatory Visit: Payer: Self-pay | Admitting: Radiology

## 2020-10-24 NOTE — Progress Notes (Signed)
Called and spoke with patient and wife, Curt Bears, and confirmed stopped Eliquis 10/21/2020, to be NPO after midnight and may take other meds with sip of water morning of procedure and confirmed to not include Eliquis with morning meds, adult driver to accompany patient, confirmed arrival time 8am for 9am procedure and patient verbalized understanding.

## 2020-10-25 ENCOUNTER — Ambulatory Visit
Admission: RE | Admit: 2020-10-25 | Discharge: 2020-10-25 | Disposition: A | Discharge status: Home / Self Care | Payer: Medicare HMO | Source: Ambulatory Visit | Attending: Oncology | Admitting: Oncology

## 2020-10-25 ENCOUNTER — Inpatient Hospital Stay: Payer: Medicare HMO | Attending: Oncology | Admitting: Hospice and Palliative Medicine

## 2020-10-25 ENCOUNTER — Other Ambulatory Visit: Payer: Self-pay

## 2020-10-25 DIAGNOSIS — E278 Other specified disorders of adrenal gland: Secondary | ICD-10-CM

## 2020-10-25 DIAGNOSIS — R590 Localized enlarged lymph nodes: Secondary | ICD-10-CM | POA: Insufficient documentation

## 2020-10-25 DIAGNOSIS — Z906 Acquired absence of other parts of urinary tract: Secondary | ICD-10-CM | POA: Insufficient documentation

## 2020-10-25 DIAGNOSIS — I252 Old myocardial infarction: Secondary | ICD-10-CM | POA: Insufficient documentation

## 2020-10-25 DIAGNOSIS — Z86718 Personal history of other venous thrombosis and embolism: Secondary | ICD-10-CM | POA: Insufficient documentation

## 2020-10-25 DIAGNOSIS — Z7901 Long term (current) use of anticoagulants: Secondary | ICD-10-CM | POA: Diagnosis not present

## 2020-10-25 DIAGNOSIS — E279 Disorder of adrenal gland, unspecified: Secondary | ICD-10-CM | POA: Insufficient documentation

## 2020-10-25 DIAGNOSIS — I10 Essential (primary) hypertension: Secondary | ICD-10-CM | POA: Insufficient documentation

## 2020-10-25 DIAGNOSIS — E785 Hyperlipidemia, unspecified: Secondary | ICD-10-CM | POA: Diagnosis not present

## 2020-10-25 DIAGNOSIS — Z936 Other artificial openings of urinary tract status: Secondary | ICD-10-CM | POA: Diagnosis not present

## 2020-10-25 DIAGNOSIS — I2510 Atherosclerotic heart disease of native coronary artery without angina pectoris: Secondary | ICD-10-CM | POA: Diagnosis not present

## 2020-10-25 DIAGNOSIS — Z9079 Acquired absence of other genital organ(s): Secondary | ICD-10-CM | POA: Insufficient documentation

## 2020-10-25 DIAGNOSIS — Z8551 Personal history of malignant neoplasm of bladder: Secondary | ICD-10-CM | POA: Insufficient documentation

## 2020-10-25 DIAGNOSIS — C7491 Malignant neoplasm of unspecified part of right adrenal gland: Secondary | ICD-10-CM | POA: Insufficient documentation

## 2020-10-25 DIAGNOSIS — Z79899 Other long term (current) drug therapy: Secondary | ICD-10-CM | POA: Insufficient documentation

## 2020-10-25 DIAGNOSIS — D72821 Monocytosis (symptomatic): Secondary | ICD-10-CM | POA: Insufficient documentation

## 2020-10-25 LAB — CBC
HCT: 37.2 % — ABNORMAL LOW (ref 39.0–52.0)
Hemoglobin: 12.5 g/dL — ABNORMAL LOW (ref 13.0–17.0)
MCH: 34.3 pg — ABNORMAL HIGH (ref 26.0–34.0)
MCHC: 33.6 g/dL (ref 30.0–36.0)
MCV: 102.2 fL — ABNORMAL HIGH (ref 80.0–100.0)
Platelets: 367 10*3/uL (ref 150–400)
RBC: 3.64 MIL/uL — ABNORMAL LOW (ref 4.22–5.81)
RDW: 14.4 % (ref 11.5–15.5)
WBC: 24 10*3/uL — ABNORMAL HIGH (ref 4.0–10.5)
nRBC: 0 % (ref 0.0–0.2)

## 2020-10-25 LAB — PROTIME-INR
INR: 1.1 (ref 0.8–1.2)
Prothrombin Time: 13.3 seconds (ref 11.4–15.2)

## 2020-10-25 MED ORDER — FENTANYL CITRATE (PF) 100 MCG/2ML IJ SOLN
INTRAMUSCULAR | Status: AC
  Filled 2020-10-25: qty 2

## 2020-10-25 MED ORDER — FENTANYL CITRATE (PF) 100 MCG/2ML IJ SOLN
INTRAMUSCULAR | Status: AC | PRN
  Administered 2020-10-25 (×2): 50 ug via INTRAVENOUS

## 2020-10-25 MED ORDER — MIDAZOLAM HCL 2 MG/2ML IJ SOLN
INTRAMUSCULAR | Status: AC | PRN
  Administered 2020-10-25 (×2): 1 mg via INTRAVENOUS

## 2020-10-25 MED ORDER — MIDAZOLAM HCL 2 MG/2ML IJ SOLN
INTRAMUSCULAR | Status: AC
  Filled 2020-10-25: qty 2

## 2020-10-25 MED ORDER — SODIUM CHLORIDE 0.9 % IV SOLN: INTRAVENOUS | Status: DC

## 2020-10-25 NOTE — Discharge Instructions (Signed)
Needle Biopsy, Care After These instructions tell you how to care for yourself after your procedure. Your doctor may also give you more specific instructions. Call your doctor if you have any problems or questions. What can I expect after the procedure? After the procedure, it is common to have:  Soreness.  Bruising.  Mild pain. Follow these instructions at home:  1. Return to your normal activities tomorrow. 2. Take over-the-counter and prescription medicines only as told by your doctor. 3. Wash your hands with soap and water before you change your bandage (dressing). If you cannot use soap and water, use hand sanitizer. 4. You may remove your bandage in 2 days 5. Check your puncture site every day for signs of infection. Watch for: ? Redness, swelling, or pain. ? Fluid or blood.  ? Pus or a bad smell. ? Warmth. 6. You can shower tomorrow 7. Keep all follow-up visits as told by your doctor. This is important. Contact a doctor if you have:  A fever.  Redness, swelling, or pain at the puncture site, and it lasts longer than a few days.  Fluid, blood, or pus coming from the puncture site.  Warmth coming from the puncture site. Get help right away if:  You have a lot of bleeding from the puncture site. Summary  After the procedure, it is common to have soreness, bruising, or mild pain at the puncture site.  Check your puncture site every day for signs of infection, such as redness, swelling, or pain.  Get help right away if you have severe bleeding from your puncture site. This information is not intended to replace advice given to you by your health care provider. Make sure you discuss any questions you have with your health care provider. Document Revised: 08/25/2017 Document Reviewed: 08/25/2017 Elsevier Patient Education  2020 Elsevier Inc. 

## 2020-10-25 NOTE — Progress Notes (Signed)
Multidisciplinary Oncology Council Documentation  EYOB GODLEWSKI was presented by our [facility] on 10/25/2020, which included representatives from:  . Palliative Care . Dietitian  . Physical/Occupational Therapist . Nurse Navigator . Genetics . Speech Therapist . Social work . Survivorship RN . Hotel manager . Research RN . [place] currently presents with history of adrenal mass  We reviewed previous medical and familial history, history of present illness, and recent lab results along with all available histopathologic and imaging studies. The [place] considered available treatment options and made the following recommendations/referrals:  Referral for nutrition. Eval for therapy needs  The MOC is a meeting of clinicians from various specialty areas who evaluate and discuss patients for whom a multidisciplinary approach is being considered. Final determinations in the plan of care are those of the provider(s).   Today's extended care, comprehensive team conference, Isiah was not present for the discussion and was not examined.

## 2020-10-25 NOTE — H&P (Signed)
Chief Complaint: Right adrenal mass  Referring Physician(s): Yu,Zhou  Patient Status: [facility] - Out-pt  History of Present Illness: Christian [name] is a 80 y.o. male With history of aortic aneurysm, bladder cancer status post cystectomy and ileal conduit placement, CAD, DVT, hydrocele, hyperlipidemia, hypertension, MI, nephrolithiasis, and sleep apnea who presents to interventional radiology for biopsy of right adrenal mass.  He denies chest or abdominal pain.  He has mild cough which he attributes to postnasal drainage.  He denies nausea or vomiting.  His workup of urine catecholamines did not demonstrate any significant elevation of epinephrine or norepinephrine.     Past Medical History:  Diagnosis Date  . Aortic aneurysm ([place])    followed by vascular surgery yearly.  . Bladder cancer [facility])    s/p bladder resection. [place]  . Coronary artery disease    AMI anterior wall 1997; s/p PTCA to LAD 11/1995 [facility]  Marland Kitchen DVT (deep venous thrombosis) ([place]) 10/07/2007  . Glucose intolerance (impaired glucose tolerance)   . Hydrocele   . Hyperlipidemia   . Hypertension   . MI (myocardial infarction) ([place]) 1997  . Nephrolithiasis   . Sleep apnea     Past Surgical History:  Procedure Laterality Date  . [place]  . bladder removed    . CARDIAC CATHETERIZATION    . COLONOSCOPY  08/27/2011   normal.  [facility].  Marland Kitchen HYDROCELE EXCISION    . PROSTATE SURGERY     prostatectomy with bladder resection.    Allergies: Ceftriaxone, Other, Sulfasalazine, and Sulfa antibiotics  Medications: Prior to Admission medications   Medication Sig Start Date End Date Taking? Authorizing Provider  atorvastatin (LIPITOR) 10 MG tablet Take 1 tablet (10 mg total) by mouth daily at 6 PM. 02/23/18  Yes [name], MD  Omega-3 Fatty Acids (RA FISH OIL) 1000 MG CAPS Take 1,000 mg by mouth 2 (two) times daily.    Yes [provider]  ramipril (ALTACE) 10 MG capsule Take 1  capsule (10 mg total) by mouth daily. 02/23/18  Yes [name], MD  apixaban (ELIQUIS) 5 MG TABS tablet Take 1 tablet (5 mg total) by mouth 2 (two) times daily. 08/17/18   Minna Merritts, MD  azelastine (ASTELIN) 0.1 % nasal spray Place into the nose 2 (two) times daily as needed. 08/10/20 08/10/21  [provider]  fluticasone (FLONASE) 50 MCG/ACT nasal spray Place 2 sprays into both nostrils as needed.  04/19/19   [provider]  nitroGLYCERIN (NITROSTAT) 0.4 MG SL tablet Place 1 tablet (0.4 mg total) under the tongue every 5 (five) minutes as needed for chest pain. 02/23/18   [name], MD  omeprazole (PRILOSEC) 20 MG capsule Take by mouth. Patient not taking: Reported on 10/25/2020 09/18/20   [provider]     Family History  Problem Relation Age of Onset  . Stroke Mother   . Diabetes Mother   . Lung cancer Father   . Cancer Father        lung cancer  . Diabetes Sister   . Diabetes Brother     Social History   Socioeconomic History  . Marital status: Married    Spouse name: Not on file  . Number of children: Not on file  . Years of education: Not on file  . Highest education level: Not on file  Occupational History  . Occupation: retired  Tobacco Use  . Smoking status: Never Smoker  .  Smokeless tobacco: Never Used  . Tobacco comment: tobacco use- no   Vaping Use  . Vaping Use: Never used  Substance and Sexual Activity  . Alcohol use: No  . Drug use: No  . Sexual activity: Yes  Other Topics Concern  . Not on file  Social History Narrative   Marital status:  Married x 28 years; happily married; third marriage.      Children: none; 2 stepdaughters; 3 grandchildren.  3 gg.      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      Tobacco:  None      Alcohol:  None; socially      Drugs: none     Exercise: Regularly exercise/walking; walking daily.  No formal exercise plan in 2018.      ADLs: independent with ADLs.    Drives; no assistant device.      Advanced Directives:  Yes; FULL CODE.  No prolonged measures.      Seatbelt: 100%         Social Determinants of Radio broadcast assistant Strain: Not on file  Food Insecurity: Not on file  Transportation Needs: Not on file  Physical Activity: Not on file  Stress: Not on file  Social Connections: Not on file    Review of Systems: A 12 point ROS discussed and pertinent positives are indicated in the HPI above.  All other systems are negative.  Review of Systems  Vital Signs: BP 123/81   Pulse 83   Temp 98.4 F (36.9 C) (Oral)   Resp 15   Ht 5' 8.5" (1.74 m)   Wt 118.8 kg   SpO2 96%   BMI 39.26 kg/m   Physical Exam Constitutional:      Appearance: Normal appearance. He is obese.  HENT:     Mouth/Throat:     Mouth: Mucous membranes are moist.     Pharynx: Oropharynx is clear.  Cardiovascular:     Rate and Rhythm: Normal rate and regular rhythm.     Heart sounds: Normal heart sounds.  Pulmonary:     Effort: Pulmonary effort is normal.     Breath sounds: Normal breath sounds.  Abdominal:     General: Bowel sounds are normal.     Palpations: Abdomen is soft.     Comments: Right lower quadrant ostomy  Skin:    General: Skin is warm and dry.  Neurological:     Mental Status: He is alert and oriented to person, place, and time.     Imaging: CT ABDOMEN PELVIS W WO CONTRAST  Result Date: 09/26/2020 CLINICAL DATA:  Characterize renal lesion identified by CT, history of bladder malignancy EXAM: CT ABDOMEN AND PELVIS WITHOUT AND WITH CONTRAST TECHNIQUE: Multidetector CT imaging of the abdomen and pelvis was performed following the standard protocol before and following the bolus administration of intravenous contrast. CONTRAST:  124mL OMNIPAQUE IOHEXOL 300 MG/ML  SOLN COMPARISON:  CT chest, 09/22/2020, CT abdomen pelvis, 12/01/2016 FINDINGS: Lower chest: No acute abnormality.  Coronary artery calcifications. Hepatobiliary: No solid liver  abnormality is seen. No gallstones, gallbladder wall thickening, or biliary dilatation. Pancreas: Unremarkable. No pancreatic ductal dilatation or surrounding inflammatory changes. Spleen: Normal in size without significant abnormality. Adrenals/Urinary Tract: There is a contrast enhancing mass centered about the right adrenal gland, measuring 7.3 x 5.2 cm, which appears to be separated from the adjacent superior pole of the right kidney by a subtle fat plane (series 15, image 42)  and is very closely abutting the posterior right lobe of the liver. The normal right adrenal gland is not visualized. The left adrenal gland is normal. The remaining portions of the kidneys are normal. No hydronephrosis. Tiny nonobstructive inferior pole calculus of the right kidney. Redemonstrated postoperative findings status post cystoprostatectomy and right lower quadrant ileal conduit urinary diversion. Stomach/Bowel: Stomach is within normal limits. Status post ileal conduit urinary diversion. No evidence of bowel wall thickening, distention, or inflammatory changes. Sigmoid diverticula. Vascular/Lymphatic: Aortic atherosclerosis. There are multiple enlarged adjacent right retroperitoneal, celiac axis, and crural lymph nodes measuring up to 2.1 x 1.6 cm (series 15, image 64). Reproductive: No mass or other significant abnormality. Other: No abdominal wall hernia or abnormality. No abdominopelvic ascites. Musculoskeletal: No acute or significant osseous findings. IMPRESSION: 1. There is a contrast enhancing mass centered about the right adrenal gland, measuring 7.3 x 5.2 cm, which appears to be separated from the adjacent superior pole of the right kidney by a subtle fat plane and is very closely abutting the posterior right lobe of the liver. The normal right adrenal gland is not visualized. This mass is of uncertain nature, and differential considerations include metastatic disease from known bladder malignancy, renal cell  carcinoma, or adrenal cortical carcinoma. Consider multi disciplinary referral and tissue sampling. 2. There are multiple enlarged adjacent right retroperitoneal, celiac axis, and crural lymph nodes, consistent with nodal metastatic disease. 3. Redemonstrated postoperative findings status post cystoprostatectomy and right lower quadrant ileal conduit urinary diversion. No evidence of malignant recurrence in the pelvis. 4. Nonobstructive right nephrolithiasis. 5. Coronary artery disease. Aortic Atherosclerosis (ICD10-I70.0). These results will be called to the ordering clinician or representative by the Radiologist Assistant, and communication documented in the PACS or [employer]. Electronically Signed   By: Eddie Candle M.D.   On: 09/26/2020 15:25   US ABDOMEN LIMITED RUQ (LIVER/GB)  Result Date: 09/28/2020 CLINICAL DATA:  Dyspnea.  Leukocytosis. EXAM: ULTRASOUND ABDOMEN LIMITED RIGHT UPPER QUADRANT COMPARISON:  CT dated 09/26/2020 FINDINGS: Gallbladder: No gallstones or wall thickening visualized. No sonographic Murphy sign noted by sonographer. Common bile duct: Diameter: 4 mm. Liver: No focal lesion identified. Within normal limits in parenchymal echogenicity. Portal vein is patent on color Doppler imaging with normal direction of blood flow towards the liver. Other: Again noted is a right adrenal mass that is hypoechoic and heterogeneous measuring 5.7 x 7.7 x 4.9 cm. IMPRESSION: 1. No acute abnormality. No evidence for cholelithiasis or acute cholecystitis. 2. Again noted is a right adrenal mass better visualized on the patient's prior CT. Electronically Signed   By: Constance Holster M.D.   On: 09/28/2020 15:59    Labs:  CBC: Recent Labs    09/29/20 1224 10/25/20 0846  WBC 24.1* 24.0*  HGB 13.1 12.5*  HCT 37.6* 37.2*  PLT 336 367    COAGS: Recent Labs    09/29/20 1224 10/25/20 0846  INR 1.1 1.1  APTT 27  --     BMP: Recent Labs    09/29/20 1224  NA 131*  K 4.2  CL 95*   CO2 26  GLUCOSE 115*  BUN 12  CALCIUM 9.2  CREATININE 0.82  GFRNONAA >60    LIVER FUNCTION TESTS: Recent Labs    09/29/20 1224  BILITOT 0.4  AST 22  ALT 17  ALKPHOS 84  PROT 8.8*  ALBUMIN 4.0    TUMOR MARKERS: No results for input(s): AFPTM, CEA, CA199, CHROMGRNA in the last 8760 hours.  Assessment and Plan:  80 year old gentleman with prior history of bladder cancer status post cystectomy and ileal conduit placement was recently found to have a right adrenal mass and retroperitoneal adenopathy.  Workup of urine catecholamines showed no significant elevation of epinephrine are norepinephrine.  He presents today measure radiology today for CT-guided biopsy of the right adrenal mass.  I discussed with him the expected course of the procedure as well as what to expect after the procedure.   Risks and benefits of adrenal gland biopsy was discussed with the patient and/or patient's family including, but not limited to bleeding, infection, damage to adjacent structures or low yield requiring additional tests.  All of the questions were answered and there is agreement to proceed.  Consent signed and in chart.      Thank you for this interesting consult.  I greatly enjoyed meeting Christian [name] and look forward to participating in their care.  A copy of this report was sent to the requesting provider on this date.  Electronically Signed: Paula Libra Okie Jansson, MD 10/25/2020, 10:03 AM   I spent a total of  15 Minutes  in face to face in clinical consultation, greater than 50% of which was counseling/coordinating care for right adrenal mass biopsy.

## 2020-10-25 NOTE — Procedures (Signed)
Interventional Radiology Procedure Note  Procedure: Right adrenal mass biopsy  Indication: Right adrenal mass  Findings: Please refer to procedural dictation for full description.  Complications: None  EBL: < 10 mL  Miachel Roux, MD [phone]

## 2020-10-31 ENCOUNTER — Other Ambulatory Visit: Payer: Self-pay | Admitting: Pathology

## 2020-10-31 LAB — SURGICAL PATHOLOGY

## 2020-11-01 ENCOUNTER — Inpatient Hospital Stay: Payer: Medicare HMO | Admitting: Oncology

## 2020-11-01 ENCOUNTER — Other Ambulatory Visit: Payer: Self-pay

## 2020-11-01 ENCOUNTER — Encounter: Payer: Self-pay | Admitting: Oncology

## 2020-11-01 VITALS — BP 130/81 | HR 97 | Temp 98.6°F | Resp 18 | Wt 263.4 lb

## 2020-11-01 DIAGNOSIS — E279 Disorder of adrenal gland, unspecified: Secondary | ICD-10-CM | POA: Diagnosis present

## 2020-11-01 DIAGNOSIS — E278 Other specified disorders of adrenal gland: Secondary | ICD-10-CM | POA: Diagnosis not present

## 2020-11-01 DIAGNOSIS — I252 Old myocardial infarction: Secondary | ICD-10-CM | POA: Diagnosis not present

## 2020-11-01 DIAGNOSIS — Z7901 Long term (current) use of anticoagulants: Secondary | ICD-10-CM | POA: Diagnosis not present

## 2020-11-01 DIAGNOSIS — R590 Localized enlarged lymph nodes: Secondary | ICD-10-CM

## 2020-11-01 DIAGNOSIS — Z86718 Personal history of other venous thrombosis and embolism: Secondary | ICD-10-CM | POA: Diagnosis not present

## 2020-11-01 DIAGNOSIS — D72829 Elevated white blood cell count, unspecified: Secondary | ICD-10-CM | POA: Diagnosis not present

## 2020-11-01 DIAGNOSIS — Z8551 Personal history of malignant neoplasm of bladder: Secondary | ICD-10-CM | POA: Diagnosis not present

## 2020-11-01 DIAGNOSIS — Z79899 Other long term (current) drug therapy: Secondary | ICD-10-CM | POA: Diagnosis not present

## 2020-11-01 DIAGNOSIS — D72821 Monocytosis (symptomatic): Secondary | ICD-10-CM | POA: Diagnosis not present

## 2020-11-01 DIAGNOSIS — Z906 Acquired absence of other parts of urinary tract: Secondary | ICD-10-CM | POA: Diagnosis not present

## 2020-11-01 DIAGNOSIS — Z9079 Acquired absence of other genital organ(s): Secondary | ICD-10-CM | POA: Diagnosis not present

## 2020-11-01 DIAGNOSIS — I10 Essential (primary) hypertension: Secondary | ICD-10-CM | POA: Diagnosis not present

## 2020-11-01 NOTE — Progress Notes (Signed)
Patient feels fatigued.

## 2020-11-01 NOTE — Progress Notes (Addendum)
Hematology/Oncology Consult note [facility] [phone] [phone]   Patient Care Team: [name], MD as PCP - General (Family Medicine) Minna Merritts, MD as PCP - Cardiology (Cardiology)  REFERRING PROVIDER: [name], MD  CHIEF COMPLAINTS/REASON FOR VISIT:  Evaluation of adrenal mass  HISTORY OF PRESENTING ILLNESS:   Christian [name] is a  80 y.o.  male with PMH listed below was seen in consultation at the request of  [name], MD  for evaluation of adrenal mass   09/22/2020, patient underwent a CT chest without contrast for persistent coughing .  CT showed interval development of oral at least 8.8 cm right upper abdomen hypodense mass lesion that may arise from the right kidney and is abutting the liver and inferior vena cava.  Retroperitoneal lymphadenopathy. Mild paraseptal and at least moderate centrilobular emphysema test changes.  Diffuse mild bronchial wall thickening that could represent smoking-related bronchiolitis.  Three-vessel severe coronary artery calcifications.  Aortic valve leaflet calcification.  Aortic atherosclerosis and emphysema. 09/26/2020, patient underwent a CT abdomen pelvis with and without contrast for further evaluation There is a contrast enhancing mass centered about the right adrenal gland measuring 7.3 x 5.2 cm which appears to be separated from the adjacent superior pole of the right kidney by a subtle fat plane and is very closely abutting the posterior right lobe of the liver.  Normal right adrenal gland is not visualized.  Masses of uncertain nature. Multiple enlarged adjacent right retroperitoneal, celiac axis, crural lymph node consistent with nodal metastasis. Redemonstrated postoperative findings of cystoprostatectomy in the right lower quadrant ileal conduit urinary diversion.  No evidence of malignant recurrence in the pelvis.  Nonobstructive right nephrolithiasis.  CAD.  Aortic  atherosclerosis  Patient was referred to establish care at the cancer center for further evaluation and management. Patient reports for the 20 pounds unintentional weight loss and feeling very weak and fatigued for the past few months.  Appetite is decreased.  He eats about 50% of the portion he used to eat.  Denies any night sweating a low-grade fever.  He was accompanied by his wife. Denies any shortness of breath, chest pain, abdominal pain. Patient has a history of DVT, he is on Eliquis 5 mg twice daily. Patient has a history of bladder cancer status post radical cystoprostatectomy in 2007 by Dr. [place] Crane at [facility].pTis N0.  Was last seen by Dr. Bernardo Heater approximately 6 months ago. Earliest urology documentation in current [place] is from 2014.  Unclear what was the scenario for patient to proceed with the surgery.  The patient he has failed BCG treatments in the past.  Reviewed patient's previous blood work done at primary care provider's office.  Patient has had new onset of leukocytosis since December 2021.  Persistently elevated.   INTERVAL HISTORY Christian [name] is a 80 y.o. male who has above history reviewed by me today presents for follow up visit for management of adrenal mass Problems and complaints are listed below: 10/25/2020, status post adrenal mass biopsy. Patient presents to discuss results. Continues to have right side of abdomen discomfort.  Fatigue.    Review of Systems  Constitutional: Positive for appetite change, fatigue and unexpected weight change. Negative for chills and fever.  HENT:   Negative for hearing loss and voice change.   Eyes: Negative for eye problems and icterus.  Respiratory: Negative for chest tightness, cough and shortness of breath.   Cardiovascular: Negative for  chest pain and leg swelling.  Gastrointestinal: Negative for abdominal distention and abdominal pain.  Endocrine: Negative for hot flashes.  Genitourinary:  Negative for difficulty urinating, dysuria and frequency.   Musculoskeletal: Negative for arthralgias.  Skin: Negative for itching and rash.  Neurological: Negative for light-headedness and numbness.  Hematological: Negative for adenopathy. Does not bruise/bleed easily.  Psychiatric/Behavioral: Negative for confusion.    MEDICAL HISTORY:  Past Medical History:  Diagnosis Date  . Aortic aneurysm ([place])    followed by vascular surgery yearly.  . Bladder cancer [facility])    s/p bladder resection. [place]  . Coronary artery disease    AMI anterior wall 1997; s/p PTCA to LAD 11/1995 [facility].  Marland Kitchen DVT (deep venous thrombosis) ([place]) 10/07/2007  . Glucose intolerance (impaired glucose tolerance)   . Hydrocele   . Hyperlipidemia   . Hypertension   . MI (myocardial infarction) ([place]) 1997  . Nephrolithiasis   . Sleep apnea     SURGICAL HISTORY: Past Surgical History:  Procedure Laterality Date  . [place]  . bladder removed    . CARDIAC CATHETERIZATION    . COLONOSCOPY  08/27/2011   normal.  [facility].  Marland Kitchen HYDROCELE EXCISION    . PROSTATE SURGERY     prostatectomy with bladder resection.    SOCIAL HISTORY: Social History   Socioeconomic History  . Marital status: Married    Spouse name: Not on file  . Number of children: Not on file  . Years of education: Not on file  . Highest education level: Not on file  Occupational History  . Occupation: retired  Tobacco Use  . Smoking status: Never Smoker  . Smokeless tobacco: Never Used  . Tobacco comment: tobacco use- no   Vaping Use  . Vaping Use: Never used  Substance and Sexual Activity  . Alcohol use: No  . Drug use: No  . Sexual activity: Yes  Other Topics Concern  . Not on file  Social History Narrative   Marital status:  Married x 28 years; happily married; third marriage.      Children: none; 2 stepdaughters; 3 grandchildren.  3 gg.      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       Tobacco:  None      Alcohol:  None; socially      Drugs: none     Exercise: Regularly exercise/walking; walking daily.  No formal exercise plan in 2018.      ADLs: independent with ADLs.   Drives; no assistant device.      Advanced Directives:  Yes; FULL CODE.  No prolonged measures.      Seatbelt: 100%         Social Determinants of Radio broadcast assistant Strain: Not on file  Food Insecurity: Not on file  Transportation Needs: Not on file  Physical Activity: Not on file  Stress: Not on file  Social Connections: Not on file  Intimate Partner Violence: Not on file    FAMILY HISTORY: Family History  Problem Relation Age of Onset  . Stroke Mother   . Diabetes Mother   . Lung cancer Father   . Cancer Father        lung cancer  . Diabetes Sister   . Diabetes Brother     ALLERGIES:  is allergic to ceftriaxone, other, sulfasalazine, and sulfa antibiotics.  MEDICATIONS:  Current Outpatient Medications  Medication Sig Dispense Refill  .  apixaban (ELIQUIS) 5 MG TABS tablet Take 1 tablet (5 mg total) by mouth 2 (two) times daily. 180 tablet 3  . atorvastatin (LIPITOR) 10 MG tablet Take 1 tablet (10 mg total) by mouth daily at 6 PM. 90 tablet 3  . azelastine (ASTELIN) 0.1 % nasal spray Place into the nose 2 (two) times daily as needed.    . benzonatate (TESSALON) 100 MG capsule Take by mouth.    . fluticasone (FLONASE) 50 MCG/ACT nasal spray Place 2 sprays into both nostrils as needed.     . nitroGLYCERIN (NITROSTAT) 0.4 MG SL tablet Place 1 tablet (0.4 mg total) under the tongue every 5 (five) minutes as needed for chest pain. 25 tablet 6  . Omega-3 Fatty Acids (RA FISH OIL) 1000 MG CAPS Take 1,000 mg by mouth 2 (two) times daily.     . ramipril (ALTACE) 10 MG capsule Take 1 capsule (10 mg total) by mouth daily. 90 capsule 3  . omeprazole (PRILOSEC) 20 MG capsule Take by mouth. (Patient not taking: No sig reported)     No current facility-administered medications for  this visit.     PHYSICAL EXAMINATION: ECOG PERFORMANCE STATUS: 1 - Symptomatic but completely ambulatory Vitals:   11/01/20 1447  BP: 130/81  Pulse: 97  Resp: 18  Temp: 98.6 F (37 C)   Filed Weights   11/01/20 1447  Weight: 263 lb 6.4 oz (119.5 kg)    Physical Exam Constitutional:      General: He is not in acute distress.    Comments: Patient walks independently.  HENT:     Head: Normocephalic and atraumatic.  Eyes:     General: No scleral icterus. Cardiovascular:     Rate and Rhythm: Normal rate and regular rhythm.     Heart sounds: Normal heart sounds.  Pulmonary:     Effort: Pulmonary effort is normal. No respiratory distress.     Breath sounds: No wheezing.  Abdominal:     General: Bowel sounds are normal. There is no distension.     Palpations: Abdomen is soft.  Musculoskeletal:        General: No deformity. Normal range of motion.     Cervical back: Normal range of motion and neck supple.  Skin:    General: Skin is warm and dry.     Findings: No erythema or rash.  Neurological:     Mental Status: He is alert and oriented to person, place, and time. Mental status is at baseline.     Cranial Nerves: No cranial nerve deficit.     Coordination: Coordination normal.  Psychiatric:     Comments: Anxious     LABORATORY DATA:  I have reviewed the data as listed Lab Results  Component Value Date   WBC 24.0 (H) 10/25/2020   HGB 12.5 (L) 10/25/2020   HCT 37.2 (L) 10/25/2020   MCV 102.2 (H) 10/25/2020   PLT 367 10/25/2020   Recent Labs    09/29/20 1224  NA 131*  K 4.2  CL 95*  CO2 26  GLUCOSE 115*  BUN 12  CREATININE 0.82  CALCIUM 9.2  GFRNONAA >60  PROT 8.8*  ALBUMIN 4.0  AST 22  ALT 17  ALKPHOS 84  BILITOT 0.4   Iron/TIBC/Ferritin/ %Sat    Component Value Date/Time   IRON 93 03/01/2015 1106   FERRITIN 196 03/01/2015 1106      RADIOGRAPHIC STUDIES: I have personally reviewed the radiological images as listed and agreed with the  findings  in the report. CT Biopsy  Result Date: 10/25/2020 INDICATION: Right adrenal mass. EXAM: Right renal mass biopsy MEDICATIONS: None. ANESTHESIA/SEDATION: Moderate (conscious) sedation was employed during this procedure. A total of Versed 2 mg and Fentanyl 100 mcg was administered intravenously. Moderate Sedation Time: 17 minutes. The patient's level of consciousness and vital signs were monitored continuously by radiology nursing throughout the procedure under my direct supervision. COMPLICATIONS: None immediate. PROCEDURE: Informed written consent was obtained from the patient after a thorough discussion of the procedural risks, benefits and alternatives. All questions were addressed. Maximal Sterile Barrier Technique was utilized including caps, mask, sterile gowns, sterile gloves, sterile drape, hand hygiene and skin antiseptic. A timeout was performed prior to the initiation of the procedure. Patient position left lateral decubitus on the CT table. Right flank skin prepped and draped in usual sterile fashion. Following local lidocaine administration, 17 gauge introducer needle was advanced into the right adrenal mass utilizing CT guidance. Four cores were obtained from the right adrenal mass and sent to pathology in formalin. Needle removed and hemostasis achieved with manual compression. IMPRESSION: CT guided biopsy of right adrenal mass. Electronically Signed   By: Miachel Roux M.D.   On: 10/25/2020 13:52      ASSESSMENT & PLAN:  1. Adrenal mass ([place])   2. Retroperitoneal lymphadenopathy   3. Leukocytosis, unspecified type   Adrenal mass and retroperitoneal lymphadenopathy in the context of weight loss and fatigue. Patient had a lab work-up done to rule out pheochromocytoma. Eventually underwent biopsy for the renal mass Pathology showed malignant epithelial neoplasm.  Discussed with pathology Dr. Reuel Derby, presence of focal GATA3, p40, p63 staining raise the possibility of metastatic  urothelial carcinoma. If he has urothelial carcinoma, with the involvement of retroperitoneal lymphadenopathy, they will put him to stage IV disease now recommend systemic chemotherapy.   I recommend patient to get a second opinion at [place] [facility]. Patient and wife desire to further obtain a PET scan to further evaluation of the retroperitoneal lymphadenopathy.  Will obtain.  leukocytosis, persist predominantly monocytosis as well as neutrophilia, possible reactive.  flow cytometry showed minute CD5 plus B-cell population, less than 1% of the leukocyte. Neutrophilia and monocytosis without phenotypic aberrancy. Normal LDH, negative HIV, hepatitis panel.  Orders Placed This Encounter  Procedures  . NM PET Image Initial (PI) Skull Base To Thigh    Standing Status:   Future    Standing Expiration Date:   11/01/2021    Order Specific Question:   If indicated for the ordered procedure, I authorize the administration of a radiopharmaceutical per Radiology protocol    Answer:   Yes    Order Specific Question:   Preferred imaging location?    Answer:   [facility]    All questions were answered. The patient knows to call the clinic with any problems questions or concerns.  cc [name], MD    Return of visit: To be determined We spent sufficient time to discuss many aspect of care, questions were answered to patient's satisfaction. A total of 40 minutes was spent on this visit.  With 15 minutes spent reviewing image findings, pathology reports, 15 minutes counseling the patient on the diagnosis and management of   Additional 10 minutes was spent on answering patient's questions.     Earlie Server, MD, PhD Hematology Oncology [facility] at [facility] Pager- [phone] 11/01/2020

## 2020-11-02 ENCOUNTER — Other Ambulatory Visit: Payer: Self-pay

## 2020-11-02 DIAGNOSIS — E278 Other specified disorders of adrenal gland: Secondary | ICD-10-CM

## 2020-11-02 DIAGNOSIS — R590 Localized enlarged lymph nodes: Secondary | ICD-10-CM

## 2020-11-07 ENCOUNTER — Ambulatory Visit
Admission: RE | Admit: 2020-11-07 | Discharge: 2020-11-07 | Disposition: A | Discharge status: Home / Self Care | Payer: Medicare HMO | Source: Ambulatory Visit | Attending: Oncology | Admitting: Oncology

## 2020-11-07 ENCOUNTER — Other Ambulatory Visit: Payer: Self-pay

## 2020-11-07 DIAGNOSIS — E278 Other specified disorders of adrenal gland: Secondary | ICD-10-CM | POA: Diagnosis present

## 2020-11-07 LAB — GLUCOSE, CAPILLARY: Glucose-Capillary: 103 mg/dL — ABNORMAL HIGH (ref 70–99)

## 2020-11-07 MED ORDER — FLUDEOXYGLUCOSE F - 18 (FDG) INJECTION
13.6000 | Freq: Once | INTRAVENOUS | Status: AC | PRN
  Administered 2020-11-07: 13.999 via INTRAVENOUS

## 2020-11-08 ENCOUNTER — Telehealth: Payer: Self-pay

## 2020-11-08 NOTE — Telephone Encounter (Signed)
Request for Foundation One test submitted on specimen [id number] collected on 3/2.  [id number]

## 2020-11-09 ENCOUNTER — Other Ambulatory Visit: Payer: Medicare HMO

## 2020-11-09 NOTE — Progress Notes (Signed)
Tumor Board Documentation  TAL KEMPKER was presented by Dr Tasia Catchings at our Tumor Board on 11/09/2020, which included representatives from medical oncology,radiation oncology,navigation,pathology,radiology,surgical,pharmacy,genetics,research,palliative care,pulmonology.  Lael currently presents as a new patient,for MDC,for new positive pathology with history of the following treatments: surgical intervention(s).  Additionally, we reviewed previous medical and familial history, history of present illness, and recent lab results along with all available histopathologic and imaging studies. The tumor board considered available treatment options and made the following recommendations: Additional screening,Surgery (Has appt at [facility] for evaluation  Sending tissue of origin and NGS testing)    The following procedures/referrals were also placed: No orders of the defined types were placed in this encounter.   Clinical Trial Status: not discussed   Staging used: Pathologic Stage  AJCC Staging:       Group: Stage IV Epithelial Neoplasm of unknown origin   National site-specific guidelines OTHER were discussed with respect to the case.  Tumor board is a meeting of clinicians from various specialty areas who evaluate and discuss patients for whom a multidisciplinary approach is being considered. Final determinations in the plan of care are those of the provider(s). The responsibility for follow up of recommendations given during tumor board is that of the provider.   Today's extended care, comprehensive team conference, Ever was not present for the discussion and was not examined.   Multidisciplinary Tumor Board is a multidisciplinary case peer review process.  Decisions discussed in the Multidisciplinary Tumor Board reflect the opinions of the specialists present at the conference without having examined the patient.  Ultimately, treatment and diagnostic decisions rest with the primary provider(s)  and the patient.

## 2020-11-14 DIAGNOSIS — C679 Malignant neoplasm of bladder, unspecified: Secondary | ICD-10-CM | POA: Insufficient documentation

## 2020-11-16 ENCOUNTER — Telehealth: Payer: Self-pay | Admitting: *Deleted

## 2020-11-16 NOTE — Telephone Encounter (Signed)
I returned call to Christian [name] and asked for more details regarding the call he got, he was only able to tell me that it was from [place]. Did not give any other details. Told him that it was probably from [place] one testing. I explained to him what the testing conists of and asked if he would like to proceed, but I did not get a straight yes or no answer. Instead, patient proceeded to state how he does not know what is going on and that he went to [facility] and they still don't know what kind of cancer he has and that he feels that he is not getting anywhere. I notified him that there is a process to figure out what kind of cancer he has and what treatment he needs. He was under the impression that we were sending slides to [facility] for them to review and they would tell him what kind of cancer he had and what treatment he needed at his initial consulst. I told him that there is a process for this and that [facility] needs to request slides from [facility] pathology for review, and that per the initial consult note at [facility] it looks like they will be requesting the slides. He still seemed very upset even though I tried to explain to him as best as I could. He wants to know if the slides have already been sent I told him that I did not have that information with me, and he requested for me to contact our path and find out and call him back. Pt would appreciate a call from MD as he feels that he does not know where he is standing. He has extensive questions regarding PET scan and treatment plan.   Contacted our path lab and they have not received a request for slides yet. Contacted [facility] and and spoke with triage nurse, who states that it looks like slides have not been requested yet. She said she would route message to Dr. Carmelina Peal team regarding this. Provided nurse with [facility] path phone and fax number for them to fax the request.  Returned call to Christian [name] to let him know that [facility] has not received request and  that I contacted [facility] regarding this.   Called [facility] and left message with Triage CMA for Dr. Tamala Julian (PCP) to contact Dr. Tasia Catchings.

## 2020-11-16 NOTE — Telephone Encounter (Signed)
Patient called very upset stating that he got a very disturbing call from a lab and was told we had ordered something. He knows nothing of this and wants a return call to discuss and said Do NOT charge my insurance for this. Please return call to patient to discuss

## 2020-11-20 ENCOUNTER — Telehealth: Payer: Self-pay | Admitting: *Deleted

## 2020-11-20 NOTE — Telephone Encounter (Addendum)
I checked on Friday and they had not been requested yet, pt was notified. He will probably need to call [facility] as they will need to request slides from [facility], we don't send the slides. Maybe if he calls [facility], it can speed up the process.

## 2020-11-20 NOTE — Telephone Encounter (Signed)
Patient called asking if the path slides and reports have been sent to [facility] yet and stated that he is still waiting for Dr Christian [name] to call him "for 4 days now"

## 2020-11-21 NOTE — Telephone Encounter (Signed)
I called and spoke with patient and advised per note from [place] that she has not received a request from [facility] for the slides and perhaps if he called them, it would speed up the process. He states that he spoke with [facility] before he called [place] yesterday and was told that they have requested the slides 5 days ago, but he will call them back today

## 2020-11-23 NOTE — Progress Notes (Signed)
Cardiology Office Note  Date:  11/27/2020   ID:  Christian [name], DOB 1941-08-03, MRN [id number]  PCP:  Christian Honour, MD   Chief Complaint  Patient presents with  . 12 month follow up     Patient c/o weakness but has been diagnosed with cancer; Bladder cancer metastasized to intra-abdominal lymph nodes- urothelial carcinoma. Medications reviewed by the patient verbally.     HPI:  Christian [name] is a very pleasant 80 year old gentleman with a history of  obesity , CAD,  moderate disease in a diagonal vessel, RCA and proximal circumflex, obtuse marginal branch with normal systolic function, diastolic dysfunction on echocardiogram,  DVT and PE, after first cancer operation at [facility] Bladder surgery,  obstructive sleep apnea, hypertension,  hyperlipidemia,  small AAA,  DVT April 2019 who presents for routine followup of his coronary artery disease, recurrent DVT  Followed by oncology at [facility] weight loss and fatigue in Jan 2022 with subsequent scans (CT, PET) showing a 7.3x5.2 cm mass centered around the R adrenal gland with biopsy most concerning for [facility] with extensive adenopathy and concern for potential osseous metastatic disease diffuse marrow involvement Stage IV surgical intervention is not warranted in this situation per [facility] oncology If diagnosis confirmed would undergo chemotherapy cisplatin/gem They are waiting on biopsy samples from [facility], has been several weeks Per his account has  bone marrow biopsy planned in 8 days time  Lab work reviewed with him Recent lab work WBC 22.8 Hemoglobin 12.7  Not able to do much, continues to lose weight Trouble with fatigue  EKG personally reviewed by myself on todays visit Shows normal sinus rhythm rate 87 bpm no ST or T wave change  Other past medical history reviewed Hospitalization March 2019 with UTI hematuria and sepsis Diagnosed with Escherichia coli bacteremia Positive blood cultures positive for ecoli and  enterobacter  CT scan November 17, 2017 with no PE  12/02/2017 developed swelling and pain Ultrasound left popliteal vein posterior tibial vein peroneal veins with DVT  Hospitalized for DVT April 2019 Treated with eliquis 5 twice a day   ER 12/01/2016 Urine darker, Pain in ABD from coughing CT scan of his abdomen showing Kidney stone, 2 mm  Hematuria, Possible urinary tract infection, placed on Bactrim 10 days  Last stress test was in 03/2008. He had an inferior wall defect consistent with ischemia. Significant GI uptake artifact noted in the inferior wall. EF 42%. No cardiac cath was performed at that time in follow up.  ECHO in 02/2008 shows normal EF, otherwise normal study   PMH:   has a past medical history of Aortic aneurysm [facility]), Bladder cancer ([place]), Coronary artery disease, DVT (deep venous thrombosis) ([place]) (10/07/2007), Glucose intolerance (impaired glucose tolerance), Hydrocele, Hyperlipidemia, Hypertension, MI (myocardial infarction) ([place]) (1997), Nephrolithiasis, and Sleep apnea.  PSH:    Past Surgical History:  Procedure Laterality Date  . [place]  . bladder removed    . CARDIAC CATHETERIZATION    . COLONOSCOPY  08/27/2011   normal.  [facility].  [facility] HYDROCELE EXCISION    . PROSTATE SURGERY     prostatectomy with bladder resection.    Current Outpatient Medications  Medication Sig Dispense Refill  . apixaban (ELIQUIS) 5 MG TABS tablet Take 1 tablet (5 mg total) by mouth 2 (two) times daily. 180 tablet 3  . atorvastatin (LIPITOR) 10 MG tablet Take 1 tablet (10 mg total) by mouth daily at 6 PM. 90 tablet 3  .  azelastine (ASTELIN) 0.1 % nasal spray Place into the nose 2 (two) times daily as needed.    . benzonatate (TESSALON) 100 MG capsule Take by mouth.    . calcium carbonate (TUMS EX) 750 MG chewable tablet Chew by mouth.    . fluticasone (FLONASE) 50 MCG/ACT nasal spray Place 2 sprays into both nostrils as needed.     . nitroGLYCERIN  (NITROSTAT) 0.4 MG SL tablet Place 1 tablet (0.4 mg total) under the tongue every 5 (five) minutes as needed for chest pain. 25 tablet 6  . Omega-3 Fatty Acids (RA FISH OIL) 1000 MG CAPS Take 1,000 mg by mouth 2 (two) times daily.     . ramipril (ALTACE) 10 MG capsule Take 1 capsule (10 mg total) by mouth daily. 90 capsule 3   No current facility-administered medications for this visit.     Allergies:   Ceftriaxone, Other, Sulfasalazine, and Sulfa antibiotics   Social History:  The patient  reports that he has never smoked. He has never used smokeless tobacco. He reports that he does not drink alcohol and does not use drugs.   Family History:   family history includes Cancer in his father; Diabetes in his brother, mother, and sister; Lung cancer in his father; Stroke in his mother.    Review of Systems: Review of Systems  Constitutional: Negative.   HENT: Negative.   Respiratory: Negative.   Cardiovascular: Negative.   Gastrointestinal: Negative.   Musculoskeletal: Negative.   Neurological: Negative.   Psychiatric/Behavioral: Negative.   All other systems reviewed and are negative.   PHYSICAL EXAM: VS:  BP 120/70 (BP Location: Left Arm, Patient Position: Sitting, Cuff Size: Normal)   Pulse 87   Ht '5\' 8"'  (1.727 m)   Wt 259 lb 4 oz (117.6 kg)   SpO2 97%   BMI 39.42 kg/m  , BMI Body mass index is 39.42 kg/m. Constitutional:  oriented to person, place, and time. No distress.  HENT:  Head: Grossly normal Eyes:  no discharge. No scleral icterus.  Neck: No JVD, no carotid bruits  Cardiovascular: Regular rate and rhythm, no murmurs appreciated Pulmonary/Chest: Clear to auscultation bilaterally, no wheezes or rails Abdominal: Soft.  no distension.  no tenderness.  Musculoskeletal: Normal range of motion Neurological:  normal muscle tone. Coordination normal. No atrophy Skin: Skin warm and dry Psychiatric: normal affect, pleasant   Recent Labs: 09/29/2020: ALT 17; BUN 12;  Creatinine, Ser 0.82; Potassium 4.2; Sodium 131 10/25/2020: Hemoglobin 12.5; Platelets 367    Lipid Panel Lab Results  Component Value Date   CHOL 127 02/23/2018   HDL 34 (L) 02/23/2018   LDLCALC 65 02/23/2018   TRIG 141 02/23/2018      Wt Readings from Last 3 Encounters:  11/27/20 259 lb 4 oz (117.6 kg)  11/01/20 263 lb 6.4 oz (119.5 kg)  10/25/20 262 lb (118.8 kg)      ASSESSMENT AND PLAN:  Cancer Weight loss 30 pounds, fatigue, elevated WBC Waiting for samples to be sent to [facility] RIGHT adrenal mass and extensive adenopathy and associated diffuse skeletal uptake compatible with widespread neoplasm/metastatic disease.diffuse marrow uptake  Mixed hyperlipidemia  Cholesterol is at goal on the current lipid regimen. No changes to the medications were made.  DVT: History of recurrent episodes On anticoagulation  Atherosclerosis of native coronary artery of native heart with stable angina pectoris ([place]) -  Currently with no symptoms of angina. No further workup at this time. Continue current medication regimen. Prior stress test 09/2019  OBESITY-MORBID (>100') -  Weight loss 30 pounds  Abdominal aortic aneurysm (AAA) without rupture (HCC) - Followed by vascular, stable  Aortic atherosclerosis (HCC) CT scan reviewed showing moderate distal aortic and iliac arterial disease Coronary calcifications Cholesterol at goal   Total encounter time more than 25 minutes  Greater than 50% was spent in counseling and coordination of care with the patient   No orders of the defined types were placed in this encounter.    Signed, Esmond Plants, M.D., Ph.D. 11/27/2020  [facility] [place], [place] [phone]

## 2020-11-27 ENCOUNTER — Encounter: Payer: Self-pay | Admitting: Cardiovascular Disease

## 2020-11-27 ENCOUNTER — Ambulatory Visit: Payer: Medicare HMO | Admitting: Cardiovascular Disease

## 2020-11-27 ENCOUNTER — Other Ambulatory Visit: Payer: Self-pay

## 2020-11-27 VITALS — BP 120/70 | HR 87 | Ht 68.0 in | Wt 259.2 lb

## 2020-11-27 DIAGNOSIS — I714 Abdominal aortic aneurysm, without rupture, unspecified: Secondary | ICD-10-CM

## 2020-11-27 DIAGNOSIS — E7800 Pure hypercholesterolemia, unspecified: Secondary | ICD-10-CM

## 2020-11-27 DIAGNOSIS — I700 Atherosclerosis of aorta: Secondary | ICD-10-CM | POA: Diagnosis not present

## 2020-11-27 DIAGNOSIS — I82599 Chronic embolism and thrombosis of other specified deep vein of unspecified lower extremity: Secondary | ICD-10-CM | POA: Diagnosis not present

## 2020-11-27 DIAGNOSIS — I25118 Atherosclerotic heart disease of native coronary artery with other forms of angina pectoris: Secondary | ICD-10-CM

## 2020-11-27 NOTE — Patient Instructions (Signed)
Medication Instructions:  No changes  If you need a refill on your cardiac medications before your next appointment, please call your pharmacy.    Lab work: No new labs needed   If you have labs (blood work) drawn today and your tests are completely normal, you will receive your results only by: . MyChart Message (if you have MyChart) OR . A paper copy in the mail If you have any lab test that is abnormal or we need to change your treatment, we will call you to review the results.   Testing/Procedures: No new testing needed   Follow-Up: At [facility], you and your health needs are our priority.  As part of our continuing mission to provide you with exceptional heart care, we have created designated Provider Care Teams.  These Care Teams include your primary Cardiologist (physician) and Advanced Practice Providers (APPs -  Physician Assistants and Nurse Practitioners) who all work together to provide you with the care you need, when you need it.  . You will need a follow up appointment in 6 months  . Providers on your designated Care Team:   . Christopher Berge, NP . Ryan Dunn, PA-C . Jacquelyn Visser, PA-C  Any Other Special Instructions Will Be Listed Below (If Applicable).  COVID-19 Vaccine Information can be found at: [email or web address] For questions related to vaccine distribution or appointments, please email [email or web address] or call [phone].     

## 2020-11-30 ENCOUNTER — Inpatient Hospital Stay: Payer: Medicare HMO | Attending: Oncology

## 2020-11-30 NOTE — Progress Notes (Addendum)
Nutrition  Patient did not show up for nutrition visit today.  Message sent to provider.   Abimbola Aki B. Zenia Resides, [place], [place] Registered Dietitian [phone] (mobile)

## 2020-12-01 NOTE — Addendum Note (Signed)
Addended by: Anselm Pancoast on: 12/01/2020 08:17 AM   Modules accepted: Orders

## 2020-12-04 ENCOUNTER — Encounter: Payer: Self-pay | Admitting: Oncology

## 2020-12-04 NOTE — Telephone Encounter (Signed)
Per Coolidge Breeze, MD: slides were sent to [facility] pathology last week.

## 2020-12-06 ENCOUNTER — Encounter: Payer: Self-pay | Admitting: Oncology

## 2020-12-06 NOTE — Telephone Encounter (Signed)
Result report printed and given to MD.

## 2020-12-07 NOTE — Telephone Encounter (Signed)
Patient seen on 12/05/20 at:  [facility] by Ceasar Lund, MD [phone] (Work)  [phone] (Fax)  Faxing [facility] to [facility].

## 2020-12-12 ENCOUNTER — Telehealth: Payer: Self-pay

## 2020-12-12 NOTE — Telephone Encounter (Signed)
[facility] [employer] testing results faxed to [facility] oncologist- Dr. Silva Bandy [phone])

## 2020-12-12 NOTE — Telephone Encounter (Signed)
-----   Message from Earlie Server, MD sent at 12/11/2020 10:00 PM EDT ----- Please fax report to his [facility] oncologist.

## 2021-01-16 DIAGNOSIS — R509 Fever, unspecified: Secondary | ICD-10-CM | POA: Insufficient documentation

## 2021-01-25 ENCOUNTER — Encounter: Payer: Self-pay | Admitting: Urology

## 2021-02-16 ENCOUNTER — Other Ambulatory Visit (INDEPENDENT_AMBULATORY_CARE_PROVIDER_SITE_OTHER): Payer: Medicare HMO

## 2021-02-16 ENCOUNTER — Ambulatory Visit (INDEPENDENT_AMBULATORY_CARE_PROVIDER_SITE_OTHER): Payer: Medicare HMO | Admitting: Vascular Surgery

## 2021-03-13 DIAGNOSIS — D6481 Anemia due to antineoplastic chemotherapy: Secondary | ICD-10-CM | POA: Insufficient documentation

## 2021-06-04 DIAGNOSIS — N390 Urinary tract infection, site not specified: Secondary | ICD-10-CM | POA: Insufficient documentation

## 2021-06-04 DIAGNOSIS — N179 Acute kidney failure, unspecified: Secondary | ICD-10-CM | POA: Insufficient documentation

## 2021-06-04 DIAGNOSIS — E871 Hypo-osmolality and hyponatremia: Secondary | ICD-10-CM | POA: Insufficient documentation

## 2021-06-04 DIAGNOSIS — J302 Other seasonal allergic rhinitis: Secondary | ICD-10-CM | POA: Insufficient documentation

## 2021-06-04 DIAGNOSIS — N189 Chronic kidney disease, unspecified: Secondary | ICD-10-CM | POA: Insufficient documentation

## 2021-07-25 ENCOUNTER — Other Ambulatory Visit (INDEPENDENT_AMBULATORY_CARE_PROVIDER_SITE_OTHER): Payer: Self-pay | Admitting: Vascular Surgery

## 2021-07-25 DIAGNOSIS — I714 Abdominal aortic aneurysm, without rupture, unspecified: Secondary | ICD-10-CM

## 2021-07-27 ENCOUNTER — Other Ambulatory Visit: Payer: Self-pay

## 2021-07-27 ENCOUNTER — Ambulatory Visit (INDEPENDENT_AMBULATORY_CARE_PROVIDER_SITE_OTHER): Payer: Medicare HMO | Admitting: Vascular Surgery

## 2021-07-27 ENCOUNTER — Ambulatory Visit (INDEPENDENT_AMBULATORY_CARE_PROVIDER_SITE_OTHER): Payer: Medicare HMO

## 2021-07-27 VITALS — BP 106/71 | HR 85 | Ht 70.0 in | Wt 248.0 lb

## 2021-07-27 DIAGNOSIS — I7143 Infrarenal abdominal aortic aneurysm, without rupture: Secondary | ICD-10-CM | POA: Diagnosis not present

## 2021-07-27 DIAGNOSIS — I10 Essential (primary) hypertension: Secondary | ICD-10-CM | POA: Diagnosis not present

## 2021-07-27 DIAGNOSIS — E7800 Pure hypercholesterolemia, unspecified: Secondary | ICD-10-CM

## 2021-07-27 DIAGNOSIS — C772 Secondary and unspecified malignant neoplasm of intra-abdominal lymph nodes: Secondary | ICD-10-CM

## 2021-07-27 DIAGNOSIS — I714 Abdominal aortic aneurysm, without rupture, unspecified: Secondary | ICD-10-CM | POA: Diagnosis not present

## 2021-07-27 DIAGNOSIS — C679 Malignant neoplasm of bladder, unspecified: Secondary | ICD-10-CM | POA: Diagnosis not present

## 2021-07-27 NOTE — Progress Notes (Signed)
MRN : [id number]  Christian [name] is a 80 y.o. ([birth year]) male who presents with chief complaint of  Chief Complaint  Patient presents with   Follow-up    75yr AAA  .  History of Present Illness: Patient returns today in follow up of his abdominal aortic aneurysm.  He has been getting immunotherapy for his bladder cancer recently.  He underwent several months of chemotherapy and had a resection previously.  He says the treatments are doing well and this has been stable to decreased in size.  He is tolerating the immunotherapy better than the chemo although he did okay with that.  No aneurysm related symptoms. Specifically, the patient denies new back or abdominal pain, or signs of peripheral embolization. Duplex today shows a stable size of about 2.9 cm in the infrarenal aorta today.  Current Outpatient Medications  Medication Sig Dispense Refill   apixaban (ELIQUIS) 5 MG TABS tablet Take 1 tablet (5 mg total) by mouth 2 (two) times daily. 180 tablet 3   atorvastatin (LIPITOR) 10 MG tablet Take 1 tablet (10 mg total) by mouth daily at 6 PM. 90 tablet 3   azelastine (ASTELIN) 0.1 % nasal spray Place into the nose 2 (two) times daily as needed.     gabapentin (NEURONTIN) 300 MG capsule Take 300 mg by mouth at bedtime.     magnesium oxide (MAG-OX) 400 MG tablet Take 1 tablet by mouth daily.     nitroGLYCERIN (NITROSTAT) 0.4 MG SL tablet Place 1 tablet (0.4 mg total) under the tongue every 5 (five) minutes as needed for chest pain. 25 tablet 6   ramipril (ALTACE) 10 MG capsule Take 1 capsule (10 mg total) by mouth daily. 90 capsule 3   benzonatate (TESSALON) 100 MG capsule Take by mouth. (Patient not taking: Reported on 07/27/2021)     calcium carbonate (TUMS EX) 750 MG chewable tablet Chew by mouth. (Patient not taking: Reported on 07/27/2021)     fluticasone (FLONASE) 50 MCG/ACT nasal spray Place 2 sprays into both nostrils as needed.  (Patient not taking: Reported on 07/27/2021)     Omega-3  Fatty Acids (RA FISH OIL) 1000 MG CAPS Take 1,000 mg by mouth 2 (two) times daily.  (Patient not taking: Reported on 07/27/2021)     No current facility-administered medications for this visit.    Past Medical History:  Diagnosis Date   Aortic aneurysm ([place])    followed by vascular surgery yearly.   Bladder cancer [facility])    s/p bladder resection. [place]   Coronary artery disease    AMI anterior wall 1997; s/p PTCA to LAD 11/1995 [facility]   DVT (deep venous thrombosis) (H[facility]) 10/07/2007   Glucose intolerance (impaired glucose tolerance)    Hydrocele    Hyperlipidemia    Hypertension    MI (myocardial infarction) ([place]) 1997   Nephrolithiasis    Sleep apnea     Past Surgical History:  Procedure Laterality Date   [place]   bladder removed     CARDIAC CATHETERIZATION     COLONOSCOPY  08/27/2011   normal.  [facility].   HYDROCELE EXCISION     PROSTATE SURGERY     prostatectomy with bladder resection.     Social History   Tobacco Use   Smoking status: Never   Smokeless tobacco: Never   Tobacco comments:    tobacco use- no   Vaping Use   Vaping Use: Never used  Substance Use  Topics   Alcohol use: No   Drug use: No      Family History  Problem Relation Age of Onset   Stroke Mother    Diabetes Mother    Lung cancer Father    Cancer Father        lung cancer   Diabetes Sister    Diabetes Brother      Allergies  Allergen Reactions   Ceftriaxone Hives   Other Hives   Sulfasalazine Hives   Sulfa Antibiotics Hives    REVIEW OF SYSTEMS (Negative unless checked)   Constitutional: [] Weight loss  [] Fever  [] Chills Cardiac: [] Chest pain   [] Chest pressure   [] Palpitations   [] Shortness of breath when laying flat   [] Shortness of breath at rest   [] Shortness of breath with exertion. Vascular:  [] Pain in legs with walking   [] Pain in legs at rest   [] Pain in legs when laying flat   [] Claudication   [] Pain in feet when walking  [] Pain in feet  at rest  [] Pain in feet when laying flat   [x] History of DVT   [] Phlebitis   [] Swelling in legs   [] Varicose veins   [] Non-healing ulcers Pulmonary:   [] Uses home oxygen   [] Productive cough   [] Hemoptysis   [] Wheeze  [] COPD   [] Asthma Neurologic:  [] Dizziness  [] Blackouts   [] Seizures   [] History of stroke   [] History of TIA  [] Aphasia   [] Temporary blindness   [] Dysphagia   [] Weakness or numbness in arms   [] Weakness or numbness in legs Musculoskeletal:  [x] Arthritis   [] Joint swelling   [] Joint pain   [] Low back pain Hematologic:  [] Easy bruising  [] Easy bleeding   [] Hypercoagulable state   [] Anemic   Gastrointestinal:  [] Blood in stool   [] Vomiting blood  [] Gastroesophageal reflux/heartburn   [] Abdominal pain Genitourinary:  [x] Chronic kidney disease   [] Difficult urination  [] Frequent urination  [] Burning with urination   [] Hematuria Skin:  [] Rashes   [] Ulcers   [] Wounds Psychological:  [] History of anxiety   []  History of major depression.  Physical Examination  BP 106/71   Pulse 85   Ht 5\' 10"  (1.778 m)   Wt 248 lb (112.5 kg)   BMI 35.58 kg/m  Gen:  WD/WN, NAD Head: [place]/AT, No temporalis wasting. Ear/Nose/Throat: Hearing grossly intact, nares w/o erythema or drainage Eyes: Conjunctiva clear. Sclera non-icteric Neck: Supple.  Trachea midline Pulmonary:  Good air movement, no use of accessory muscles.  Cardiac: RRR, no JVD Vascular:  Vessel Right Left  Radial Palpable Palpable                   Musculoskeletal: M/S 5/5 throughout.  No deformity or atrophy.  Trace lower extremity edema. Neurologic: Sensation diminished in extremities.  Symmetrical.  Speech is fluent.  Psychiatric: Judgment intact, Mood & affect appropriate for pt's clinical situation. Dermatologic: No rashes or ulcers noted.  No cellulitis or open wounds.      Labs No results found for this or any previous visit (from the past 2160 hour(s)).  Radiology No results  found.  Assessment/Plan Hyperlipidemia lipid control important in reducing the progression of atherosclerotic disease. Continue statin therapy     Bladder cancer S/p resection by Urology and getting chemo and immune therapy  Essential hypertension blood pressure control important in reducing the progression of atherosclerotic disease and aneurysmal growth. On appropriate oral medications.   Abdominal aortic aneurysm Duplex today shows a stable size of about 2.9 cm in the  infrarenal aorta today.  He has been in the 2.8-3.1 range on his last several checks.  We will continue to follow this annually.  No role for intervention at this size.    Leotis Pain, MD  07/27/2021 10:18 AM    This note was created with Dragon medical transcription system.  Any errors from dictation are purely unintentional

## 2021-07-27 NOTE — Assessment & Plan Note (Signed)
blood pressure control important in reducing the progression of atherosclerotic disease and aneurysmal growth. On appropriate oral medications.  

## 2021-07-27 NOTE — Assessment & Plan Note (Signed)
Duplex today shows a stable size of about 2.9 cm in the infrarenal aorta today.  He has been in the 2.8-3.1 range on his last several checks.  We will continue to follow this annually.  No role for intervention at this size.

## 2021-10-15 IMAGING — CT CT MAXILLOFACIAL W/O CM
3 of 5 series · 14 of 47 positions shown, 16 images · non-contrast
Comparison: None.

CLINICAL DATA: Sinus drainage and cough

EXAM:
CT MAXILLOFACIAL WITHOUT CONTRAST
TECHNIQUE: Multidetector CT imaging of the maxillofacial structures was
performed. Multiplanar CT image reconstructions were also generated.

[Series 7: sinus 2.00 cor · coronal · 0.27mm/px · 3 of 70 slices shown]
[im 24/70  bone]
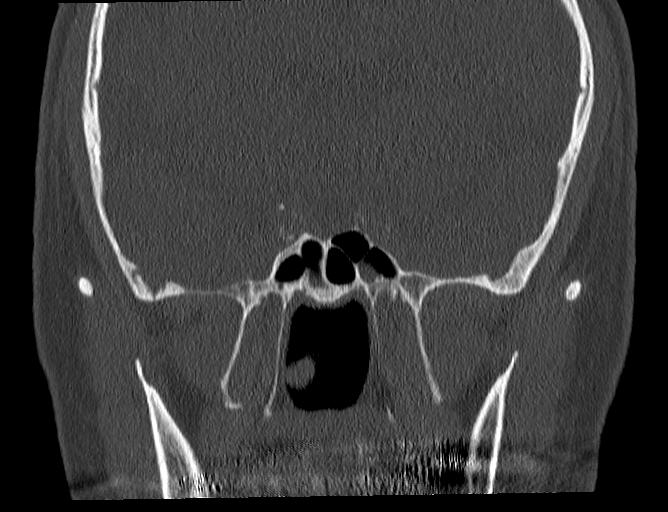
[im 31/70  bone]
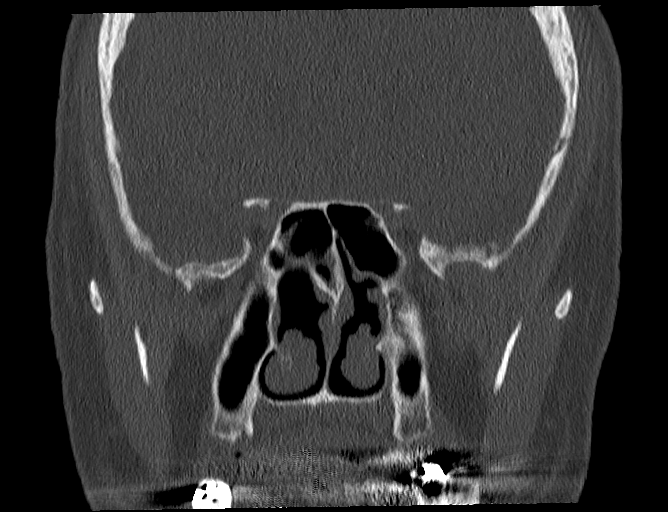
[im 39/70  bone]
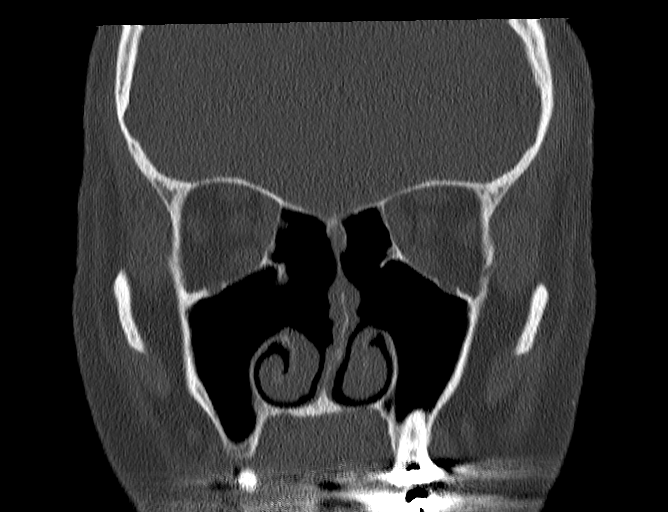

[Series 9: sinus 2.00 sag · sagittal · 0.27mm/px · 3 of 90 slices shown]
[im 30/90  bone]
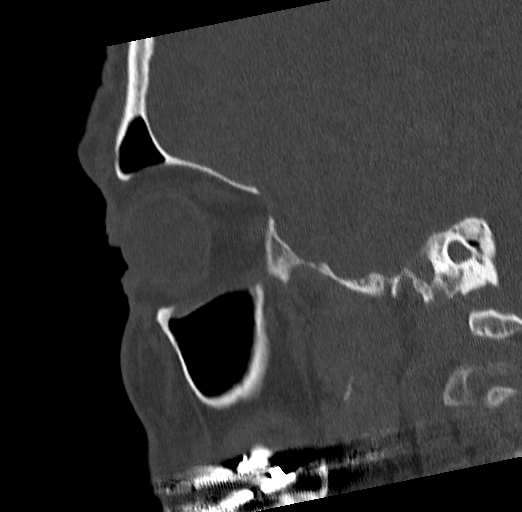
[im 45/90  bone]
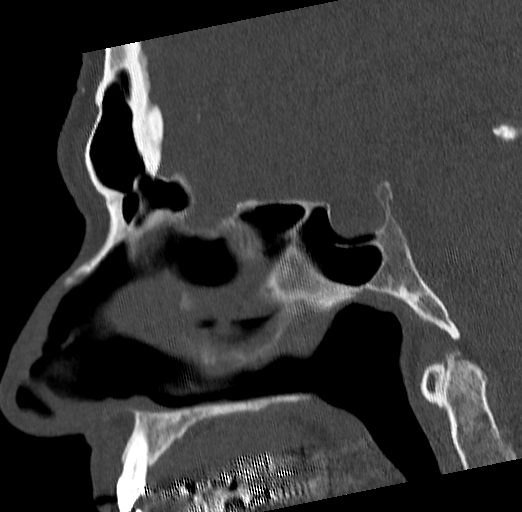
[im 60/90  bone]
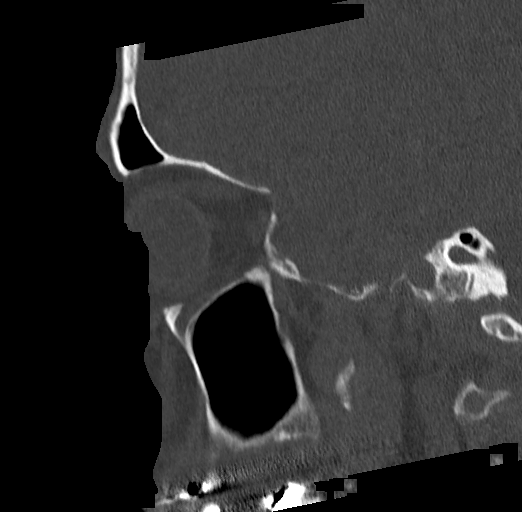

[Series 11: thins sinus 1.00 ax · axial · 0.28mm/px · z∈[-588,-472]mm · 8 of 139 slices shown, 10 images]
[im 10/139  brain]
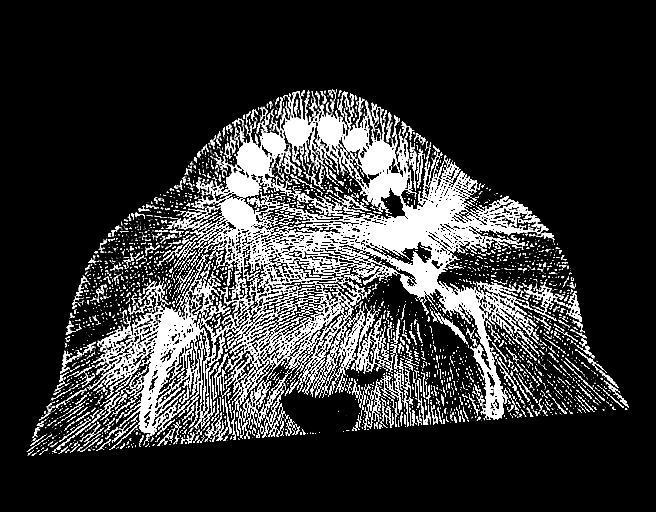
[im 10/139  bone]
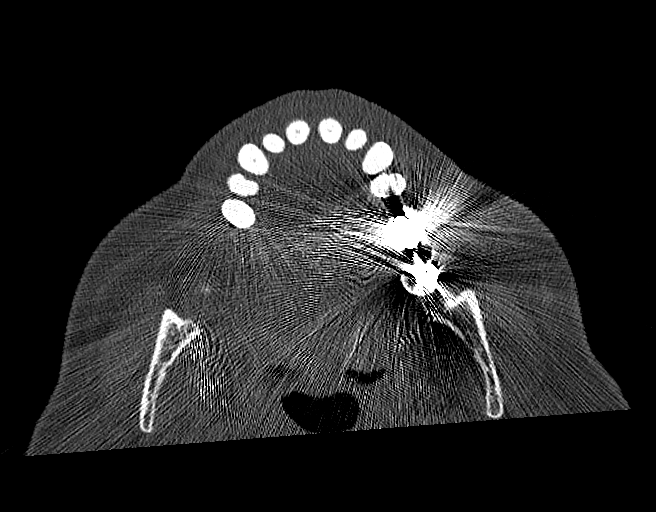
[im 30/139  bone]
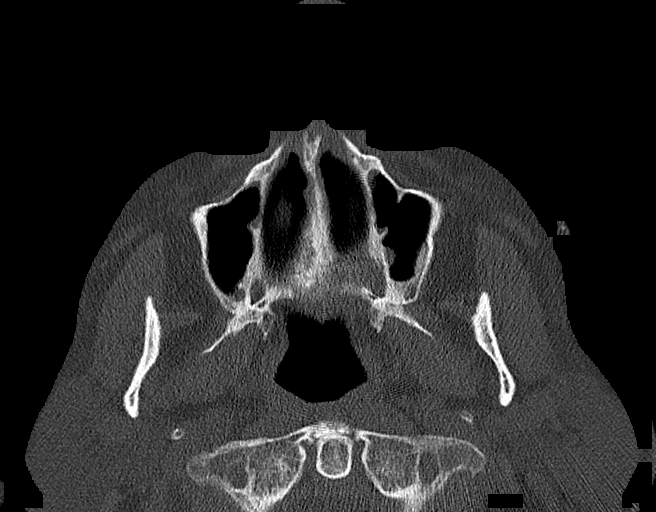
[im 50/139  bone]
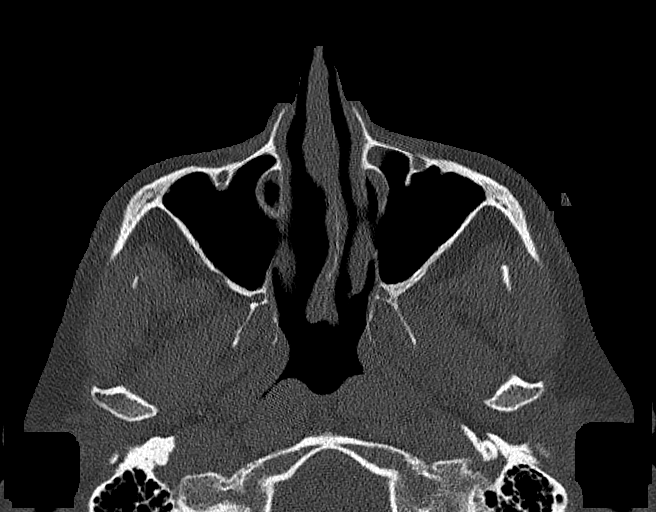
[im 60/139  bone]
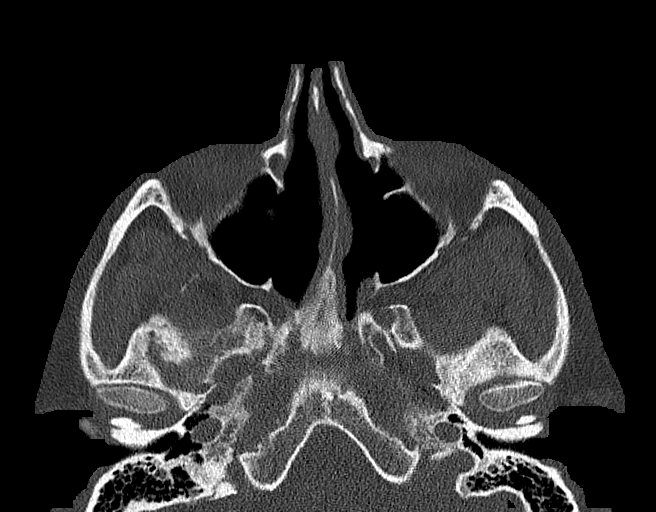
[im 79/139  brain]
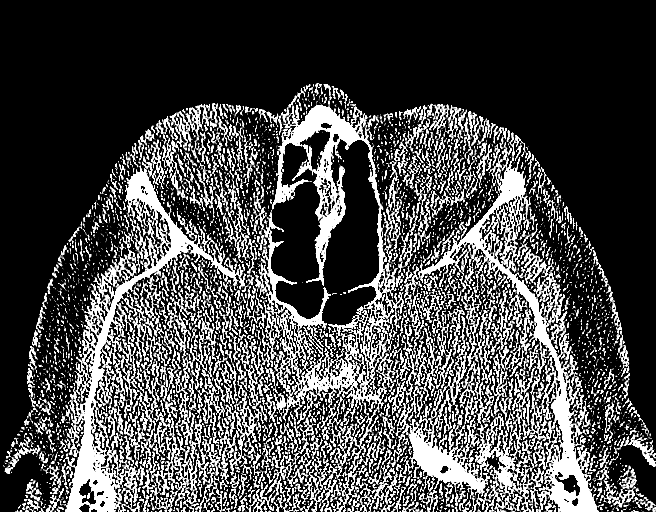
[im 79/139  bone]
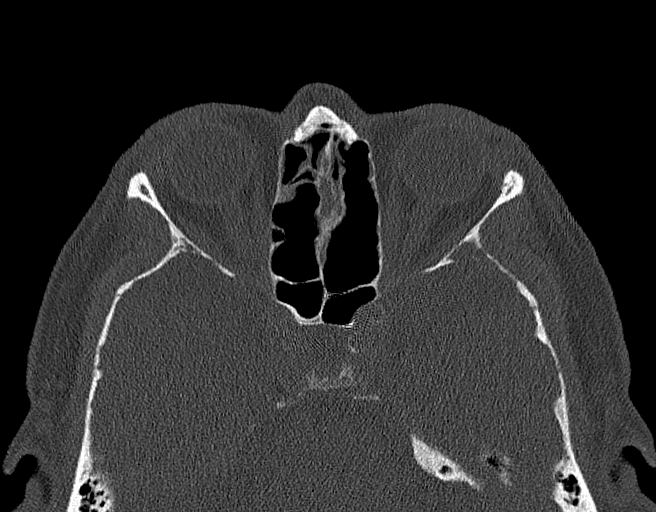
[im 89/139  bone]
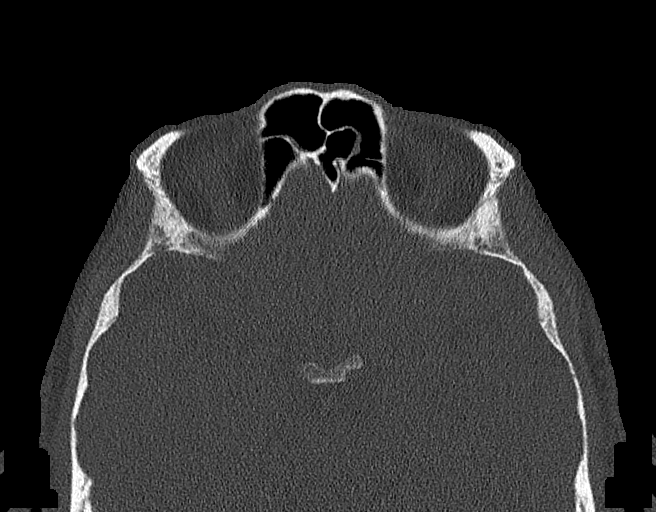
[im 109/139  bone]
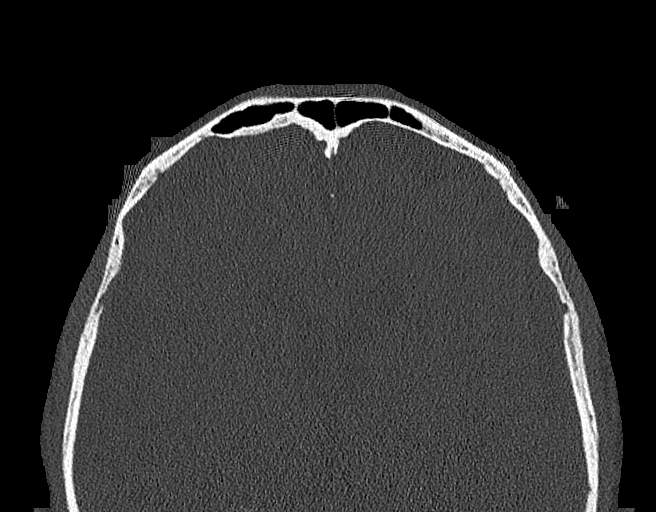
[im 129/139  bone]
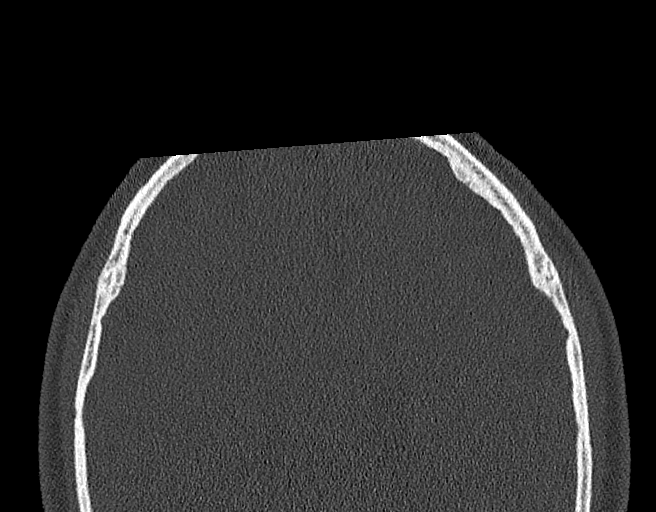

[14 of 47 positions shown; findings below may reference images not displayed]

FINDINGS: Osseous: No fracture or mandibular dislocation. No destructive
process.

Orbits: Negative. No traumatic or inflammatory finding.

Sinuses: Postsurgical changes of bilateral ethmoidectomies,
maxillary antrostomies and uncinectomies and superior and middle
turbinectomies. Mild leftward deviation of the nasal septum with
mild septal mucosal thickening. Paranasal sinuses are clear. Sinus
drainage pathways are patent.

Soft tissues: Negative.

Limited intracranial: No significant or unexpected finding.
IMPRESSION: 1. Postsurgical changes of the paranasal sinuses with patent sinus
drainage pathways.
2. Mild leftward deviation of the nasal septum with mild septal
mucosal thickening.

## 2021-11-14 ENCOUNTER — Other Ambulatory Visit: Payer: Self-pay

## 2021-11-14 ENCOUNTER — Ambulatory Visit: Payer: Medicare HMO | Admitting: Cardiovascular Disease

## 2021-11-14 ENCOUNTER — Encounter: Payer: Self-pay | Admitting: Cardiovascular Disease

## 2021-11-14 VITALS — BP 98/60 | HR 79 | Ht 70.0 in | Wt 259.1 lb

## 2021-11-14 DIAGNOSIS — T451X5A Adverse effect of antineoplastic and immunosuppressive drugs, initial encounter: Secondary | ICD-10-CM

## 2021-11-14 DIAGNOSIS — I700 Atherosclerosis of aorta: Secondary | ICD-10-CM | POA: Diagnosis not present

## 2021-11-14 DIAGNOSIS — I25118 Atherosclerotic heart disease of native coronary artery with other forms of angina pectoris: Secondary | ICD-10-CM | POA: Diagnosis not present

## 2021-11-14 DIAGNOSIS — D6481 Anemia due to antineoplastic chemotherapy: Secondary | ICD-10-CM | POA: Diagnosis not present

## 2021-11-14 DIAGNOSIS — Z8551 Personal history of malignant neoplasm of bladder: Secondary | ICD-10-CM

## 2021-11-14 DIAGNOSIS — I10 Essential (primary) hypertension: Secondary | ICD-10-CM

## 2021-11-14 NOTE — Patient Instructions (Addendum)
Medication Instructions:  ?Hold the ramipril , blood pressure low ? ?If you need a refill on your cardiac medications before your next appointment, please call your pharmacy.  ? ?Lab work: ?No new labs needed ? ?Testing/Procedures: ?No new testing needed ? ?Follow-Up: ?At [facility], you and your health needs are our priority.  As part of our continuing mission to provide you with exceptional heart care, we have created designated Provider Care Teams.  These Care Teams include your primary Cardiologist (physician) and Advanced Practice Providers (APPs -  Physician Assistants and Nurse Practitioners) who all work together to provide you with the care you need, when you need it. ? ?You will need a follow up appointment in 6 months ? ?Providers on your designated Care Team:   ?Murray Hodgkins, NP ?Christell Faith, PA-C ?Cadence Kathlen Mody, PA-C ? ?COVID-19 Vaccine Information can be found at: [email or web address] For questions related to vaccine distribution or appointments, please email [email or web address] or call [phone].  ? ?

## 2021-11-14 NOTE — Progress Notes (Signed)
Cardiology Office Note ? ?Date:  11/14/2021  ? ?ID:  Christian [name], DOB [birth year], MRN [id number] ? ?PCP:  [name], MD  ? ?Chief Complaint  ?Patient presents with  ? 6 month follow up   ?  Patient c/o fatigue. Patient had a Cardioversion at [facility] 10/16/2021. Medications reviewed by the patient verbally.   ? ? ?HPI:  ?Christian [name] is a 81 year old gentleman with a history of   ?obesity ?CAD,  ?anterior wall myocardial infarction and LAD angioplasty in April 1997 at [facility].  ?moderate disease in a diagonal vessel, RCA and proximal circumflex, obtuse marginal branch with normal systolic function, ?diastolic dysfunction  ?DVT and PE, after first cancer operation at [facility] ?Bladder surgery,  ?obstructive sleep apnea, ?hypertension,  ?hyperlipidemia,  ?small AAA,   ?DVT April 2019 ?Chronic UTIs ?who presents for routine followup of his coronary artery disease, recurrent DVT, atrial fibrillation/flutter. ? ?Discussed recent events with him ?Diagnosed with Bladder cancer metastasized to intra-abdominal lymph nodes  Retroperitoneal lymphadenopathy  ? ?Feb 2022 found on scan to have a 7x5 cm mass around the R adrenal glad with RP, celiac axis, and crural LAN with biopsy of the adrenal mass c/w mUC.  ? ?PET showed widespread nodal involvement and concern for osseous mets, although bone scan was negative.  ?started gemcitabine/cisplatin 12/13/20 and he completed C6 04/06/21.  ?Scans 04/24/21 showed continued response with mass 5.4x2.3 cm down from 7.3x5.2 cm on initial imaging with no evidence of LAN and stable pulm nodules that may or may not be metastatic disase.  ? ?Given response, maintained on  avelumab 800 mg every 2 weeks started on 05/14/21.  ?admitted 10/9-10/14/22 for sepsis from urinary source ?received 5 cycles of avelumab  ?Repeat scans show continued decrease in size of adrenal mass (3.3x1.7 cm),  ? ?"Having reaction to immunotherapy medication" ? ?Cardioversion 10/16/21 for fib/flutter, ?Noted to have rate 150  working with PT in hospital ?Recurrent E coli urospesis. Most recent admission in February 2023 ? ?Echo 2/23 ?NORMAL LEFT VENTRICULAR SYSTOLIC FUNCTION WITH MILD LVH  ? NORMAL RIGHT VENTRICULAR SYSTOLIC FUNCTION  ? VALVULAR REGURGITATION: TRIVIAL MR, MILD PR  ? NO VALVULAR STENOSIS  ? NO PRIOR STUDY FOR COMPARISON  ? ?Hypotension today ?Systolic 98, today ?He previously stopped metoprolol secondary to nausea, feeling poorly ? ?Lab work reviewed ?HGB 10 ?CR 2.0 ? ?EKG personally reviewed by myself on todays visit ?Shows normal sinus rhythm rate 79 bpm no ST or T wave change ? ?Other past medical history reviewed ?Hospitalization March 2019 with UTI hematuria and sepsis ?Diagnosed with Escherichia coli bacteremia ?Positive blood cultures positive for ecoli and enterobacter ? ?CT scan November 17, 2017 with no PE ? ?12/02/2017 developed swelling and pain ?Ultrasound left popliteal vein posterior tibial vein peroneal veins with DVT ? ?Hospitalized for DVT April 2019 ?Treated with eliquis 5 twice a day ? ? ER 12/01/2016 ?Urine darker, Pain in ABD from coughing ?CT scan of his abdomen showing Kidney stone, 2 mm ? Hematuria, Possible urinary tract infection, placed on Bactrim 10 days ? ?Last stress test was in 03/2008. He had an inferior wall defect consistent with ischemia. Significant GI uptake artifact noted in the inferior wall. EF 42%. No cardiac cath was performed at that time in follow up. ?  ?ECHO in 02/2008 shows normal EF, otherwise normal study ? ? ?PMH:   has a past medical history of Aortic aneurysm ([place]), Bladder cancer ([place]), Coronary artery disease, DVT (deep venous thrombosis) ([place]) (10/07/2007), Glucose  intolerance (impaired glucose tolerance), Hydrocele, Hyperlipidemia, Hypertension, MI (myocardial infarction) ([place]) (1997), Nephrolithiasis, and Sleep apnea. ? ?PSH:    ?Past Surgical History:  ?Procedure Laterality Date  ? ANGIOPLASTY  1997  ? [place]  ? bladder removed    ? CARDIAC CATHETERIZATION    ?  COLONOSCOPY  08/27/2011  ? normal.  [facility].  ? HYDROCELE EXCISION    ? PROSTATE SURGERY    ? prostatectomy with bladder resection.  ? ? ?Current Outpatient Medications  ?Medication Sig Dispense Refill  ? apixaban (ELIQUIS) 5 MG TABS tablet Take 1 tablet (5 mg total) by mouth 2 (two) times daily. 180 tablet 3  ? atorvastatin (LIPITOR) 10 MG tablet Take 1 tablet (10 mg total) by mouth daily at 6 PM. 90 tablet 3  ? gabapentin (NEURONTIN) 300 MG capsule Take 600 mg by mouth 2 (two) times daily.    ? magnesium oxide (MAG-OX) 400 MG tablet Take 1 tablet by mouth daily.    ? nitroGLYCERIN (NITROSTAT) 0.4 MG SL tablet Place 1 tablet (0.4 mg total) under the tongue every 5 (five) minutes as needed for chest pain. 25 tablet 6  ? ramipril (ALTACE) 10 MG capsule Take 1 capsule (10 mg total) by mouth daily. 90 capsule 3  ? benzonatate (TESSALON) 100 MG capsule Take by mouth. (Patient not taking: Reported on 07/27/2021)    ? calcium carbonate (TUMS EX) 750 MG chewable tablet Chew by mouth. (Patient not taking: Reported on 07/27/2021)    ? fluticasone (FLONASE) 50 MCG/ACT nasal spray Place 2 sprays into both nostrils as needed.  (Patient not taking: Reported on 07/27/2021)    ? Omega-3 Fatty Acids (RA FISH OIL) 1000 MG CAPS Take 1,000 mg by mouth 2 (two) times daily.  (Patient not taking: Reported on 07/27/2021)    ? ?No current facility-administered medications for this visit.  ? ? ? ?Allergies:   Ceftriaxone, Other, Sulfasalazine, and Sulfa antibiotics  ? ?Social History:  The patient  reports that he has never smoked. He has never used smokeless tobacco. He reports that he does not drink alcohol and does not use drugs.  ? ?Family History:   family history includes Cancer in his father; Diabetes in his brother, mother, and sister; Lung cancer in his father; Stroke in his mother.  ? ? ?Review of Systems: ?Review of Systems  ?Constitutional: Negative.   ?HENT: Negative.    ?Respiratory: Negative.    ?Cardiovascular:  Negative.   ?Gastrointestinal: Negative.   ?Musculoskeletal: Negative.   ?Neurological: Negative.   ?Psychiatric/Behavioral: Negative.    ?All other systems reviewed and are negative. ? ?PHYSICAL EXAM: ?VS:  BP 98/60 (BP Location: Left Arm, Patient Position: Sitting, Cuff Size: Large)   Pulse 79   Ht '5\' 10"'$  (1.778 m)   Wt 259 lb 2 oz (117.5 kg)   SpO2 98%   BMI 37.18 kg/m?  , BMI Body mass index is 37.18 kg/m?Marland Kitchen ?Constitutional:  oriented to person, place, and time. No distress.  ?HENT:  ?Head: Grossly normal ?Eyes:  no discharge. No scleral icterus.  ?Neck: No JVD, no carotid bruits  ?Cardiovascular: Regular rate and rhythm, no murmurs appreciated ?Pulmonary/Chest: Clear to auscultation bilaterally, no wheezes or rails ?Abdominal: Soft.  no distension.  no tenderness.  ?Musculoskeletal: Normal range of motion ?Neurological:  normal muscle tone. Coordination normal. No atrophy ?Skin: Skin warm and dry ?Psychiatric: normal affect, pleasant ? ?Recent Labs: ?No results found for requested labs within last 8760 hours.  ? ? ?Lipid Panel ?  Lab Results  ?Component Value Date  ? CHOL 127 02/23/2018  ? HDL 34 (L) 02/23/2018  ? [place] 65 02/23/2018  ? TRIG 141 02/23/2018  ? ?  ? ?Wt Readings from Last 3 Encounters:  ?11/14/21 259 lb 2 oz (117.5 kg)  ?07/27/21 248 lb (112.5 kg)  ?11/27/20 259 lb 4 oz (117.6 kg)  ?  ? ?ASSESSMENT AND PLAN: ? ?Bladder cancer ?Reports having difficulty with immunotherapy, getting a reaction though undefined ?He will discuss with oncology ?Weight stable ?Tumor regretion noted on periodic scans ?Lots of questions concerning recent events ? ?Paroxysmal atrial fibrillation/flutter ?In the setting of urosepsis febrile 2023 ?Underwent cardioversion, normal sinus rhythm restored ?Was placed on beta-blocker, he has since stopped the beta-blocker for symptoms of general malaise, nausea, fatigue ?Blood pressure continues to run 98 systolic on his ramipril ?Will not restart the beta-blocker ?On Eliquis  5 twice daily ? ?Mixed hyperlipidemia  ?Cholesterol is at goal on the current lipid regimen. No changes to the medications were made. ? ?DVT: ?History of recurrent episodes ?On anticoagulation, Eliquis ? ?Hypotension ?Wi

## 2022-02-22 NOTE — Progress Notes (Unsigned)
Cardiology Office Note  Date:  02/25/2022   ID:  Christian [name], DOB May 01, 1941, MRN [id number]  PCP:  [name], MD   Chief Complaint  Patient presents with   3 month follow up     "Doing well." Medications reviewed by the patient verbally.     HPI:  Christian [name] is a 81 year old gentleman with a history of   obesity CAD,  anterior wall myocardial infarction and LAD angioplasty in April 1997 at [facility].  moderate disease in a diagonal vessel, RCA and proximal circumflex, obtuse marginal branch with normal systolic function, diastolic dysfunction  DVT and PE, after first cancer operation at [facility] Bladder surgery,  obstructive sleep apnea, hypertension,  hyperlipidemia,  small AAA,   DVT April 2019 Chronic UTIs who presents for routine followup of his coronary artery disease, recurrent DVT, atrial fibrillation/flutter.  LOV 3/23 Off ramipril, BP better No orthostasis On last visit systolic pressure 98 Denies any tachypalpitations concerning for arrhythmia  Discussed recent oncologic treatment Overall has had successful treatment he feels  Summary as below reviewed with him Diagnosed with Bladder cancer metastasized to intra-abdominal lymph nodes  Retroperitoneal lymphadenopathy   Feb 2022 found on scan to have a 7x5 cm mass around the R adrenal glad with RP, celiac axis, and crural LAN with biopsy of the adrenal mass c/w mUC.   PET showed widespread nodal involvement and concern for osseous mets, although bone scan was negative.  started gemcitabine/cisplatin 12/13/20 and he completed C6 04/06/21.  Scans 04/24/21 showed continued response with mass 5.4x2.3 cm down from 7.3x5.2 cm on initial imaging with no evidence of LAN and stable pulm nodules that may or may not be metastatic disase.   Given response, maintained on  avelumab 800 mg every 2 weeks started on 05/14/21.  admitted 10/9-10/14/22 for sepsis from urinary source received 5 cycles of avelumab  Repeat scans  show continued decrease in size of adrenal mass (3.3x1.7 cm),   "Having reaction to immunotherapy medication"  Completed 11 cycles of avelumab, infusion reactions   Scans show continued slight decrease in size of adrenal mass and otherwise stable disease. Started pembrolizuma every 6 weeks Has scans scheduled  Lab work reviewed HGB 11.2 CR 1.7  EKG personally reviewed by myself on todays visit Nsr rate 72 bpm no ST or T wave changes  With a past medical history reviewed Cardioversion 10/16/21 for fib/flutter, Noted to have rate 150 working with PT in hospital Recurrent E coli urospesis. Most recent admission in February 2023  Echo 2/23 NORMAL LEFT VENTRICULAR SYSTOLIC FUNCTION WITH MILD LVH   NORMAL RIGHT VENTRICULAR SYSTOLIC FUNCTION   VALVULAR REGURGITATION: TRIVIAL MR, MILD PR   NO VALVULAR STENOSIS   NO PRIOR STUDY FOR COMPARISON   Hospitalization March 2019 with UTI hematuria and sepsis Diagnosed with Escherichia coli bacteremia Positive blood cultures positive for ecoli and enterobacter  CT scan November 17, 2017 with no PE  12/02/2017 developed swelling and pain Ultrasound left popliteal vein posterior tibial vein peroneal veins with DVT  Hospitalized for DVT April 2019 Treated with eliquis 5 twice a day   ER 12/01/2016 Urine darker, Pain in ABD from coughing CT scan of his abdomen showing Kidney stone, 2 mm  Hematuria, Possible urinary tract infection, placed on Bactrim 10 days  Last stress test was in 03/2008. He had an inferior wall defect consistent with ischemia. Significant GI uptake artifact noted in the inferior wall. EF 42%. No cardiac cath was performed at that time  in follow up.   ECHO in 02/2008 shows normal EF, otherwise normal study  PMH:   has a past medical history of Aortic aneurysm [facility]), Bladder cancer ([place]), Coronary artery disease, DVT (deep venous thrombosis) ([place]) (10/07/2007), Glucose intolerance (impaired glucose tolerance), Hydrocele,  Hyperlipidemia, Hypertension, MI (myocardial infarction) ([place]) (1997), Nephrolithiasis, and Sleep apnea.  PSH:    Past Surgical History:  Procedure Laterality Date   [place]   bladder removed     CARDIAC CATHETERIZATION     COLONOSCOPY  08/27/2011   normal.  [facility].   HYDROCELE EXCISION     PROSTATE SURGERY     prostatectomy with bladder resection.    Current Outpatient Medications  Medication Sig Dispense Refill   apixaban (ELIQUIS) 5 MG TABS tablet Take 1 tablet (5 mg total) by mouth 2 (two) times daily. 180 tablet 3   atorvastatin (LIPITOR) 10 MG tablet Take 1 tablet (10 mg total) by mouth daily at 6 PM. 90 tablet 3   gabapentin (NEURONTIN) 300 MG capsule Take 600 mg by mouth 2 (two) times daily.     magnesium oxide (MAG-OX) 400 MG tablet Take 1 tablet by mouth daily.     nitroGLYCERIN (NITROSTAT) 0.4 MG SL tablet Place 1 tablet (0.4 mg total) under the tongue every 5 (five) minutes as needed for chest pain. 25 tablet 6   traMADol (ULTRAM) 50 MG tablet Take 50 mg by mouth every 6 (six) hours as needed.     benzonatate (TESSALON) 100 MG capsule Take by mouth. (Patient not taking: Reported on 07/27/2021)     calcium carbonate (TUMS EX) 750 MG chewable tablet Chew by mouth. (Patient not taking: Reported on 07/27/2021)     fluticasone (FLONASE) 50 MCG/ACT nasal spray Place 2 sprays into both nostrils as needed.  (Patient not taking: Reported on 07/27/2021)     Omega-3 Fatty Acids (RA FISH OIL) 1000 MG CAPS Take 1,000 mg by mouth 2 (two) times daily.  (Patient not taking: Reported on 07/27/2021)     No current facility-administered medications for this visit.     Allergies:   Ceftriaxone, Other, Sulfasalazine, and Sulfa antibiotics   Social History:  The patient  reports that he has never smoked. He has never used smokeless tobacco. He reports that he does not drink alcohol and does not use drugs.   Family History:   family history includes Cancer in  his father; Diabetes in his brother, mother, and sister; Lung cancer in his father; Stroke in his mother.   Review of Systems: Review of Systems  Constitutional: Negative.   HENT: Negative.    Respiratory: Negative.    Cardiovascular: Negative.   Gastrointestinal: Negative.   Musculoskeletal: Negative.   Neurological: Negative.   Psychiatric/Behavioral: Negative.    All other systems reviewed and are negative.  PHYSICAL EXAM: VS:  BP 120/72 (BP Location: Left Arm, Patient Position: Sitting, Cuff Size: Large)   Pulse 72   Ht '5\' 9"'$  (1.753 m)   Wt 269 lb 2 oz (122.1 kg)   SpO2 96%   BMI 39.74 kg/m  , BMI Body mass index is 39.74 kg/m. Constitutional:  oriented to person, place, and time. No distress.  HENT:  Head: Grossly normal Eyes:  no discharge. No scleral icterus.  Neck: No JVD, no carotid bruits  Cardiovascular: Regular rate and rhythm, no murmurs appreciated Pulmonary/Chest: Clear to auscultation bilaterally, no wheezes or rails Abdominal: Soft.  no distension.  no tenderness.  Musculoskeletal:  Normal range of motion Neurological:  normal muscle tone. Coordination normal. No atrophy Skin: Skin warm and dry Psychiatric: normal affect, pleasant  Recent Labs: No results found for requested labs within last 365 days.    Lipid Panel Lab Results  Component Value Date   CHOL 127 02/23/2018   HDL 34 (L) 02/23/2018   LDLCALC 65 02/23/2018   TRIG 141 02/23/2018    Wt Readings from Last 3 Encounters:  02/25/22 269 lb 2 oz (122.1 kg)  11/14/21 259 lb 2 oz (117.5 kg)  07/27/21 248 lb (112.5 kg)     ASSESSMENT AND PLAN:  Bladder cancer Tumor regretion noted on periodic scans On new medication every 6 weeks, had reaction to immunotherapy Has repeat scanning scheduled later this week  Paroxysmal atrial fibrillation/flutter Maintaining NSR, Last episode In the setting of urosepsis 09/2021 Underwent cardioversion, normal sinus rhythm restored Blood pressure stable  off ACE inhibitor, we have recommended he restart the beta-blocker metoprolol succinate 12.5 daily On Eliquis 5 twice daily  Mixed hyperlipidemia  Will see if he can have labs done with Dr. Zannie Cove in august 2023 Goal LDL <70  DVT: History of recurrent episodes On anticoagulation, Eliquis  Atherosclerosis of native coronary artery of native heart with stable angina pectoris ([place]) -  Currently with no symptoms of angina. No further workup at this time. Continue current medication regimen. Prior stress test 09/2019  OBESITY-MORBID (>100') -  We have encouraged continued exercise, careful diet management in an effort to lose weight.  Abdominal aortic aneurysm (AAA) without rupture (HCC) - Followed by vascular  Aortic atherosclerosis (HCC) CT scan reviewed showing moderate distal aortic and iliac arterial disease Coronary calcifications Non-smoker, cholesterol at goal   Total encounter time more than 30 minutes  Greater than 50% was spent in counseling and coordination of care with the patient   Orders Placed This Encounter  Procedures   EKG 12-Lead     Signed, Esmond Plants, M.D., Ph.D. 02/25/2022  [place], [place]

## 2022-02-25 ENCOUNTER — Ambulatory Visit: Payer: Medicare HMO | Admitting: Cardiovascular Disease

## 2022-02-25 ENCOUNTER — Encounter: Payer: Self-pay | Admitting: Cardiovascular Disease

## 2022-02-25 VITALS — BP 120/72 | HR 72 | Ht 69.0 in | Wt 269.1 lb

## 2022-02-25 DIAGNOSIS — I82592 Chronic embolism and thrombosis of other specified deep vein of left lower extremity: Secondary | ICD-10-CM

## 2022-02-25 DIAGNOSIS — Z8551 Personal history of malignant neoplasm of bladder: Secondary | ICD-10-CM | POA: Diagnosis not present

## 2022-02-25 DIAGNOSIS — I10 Essential (primary) hypertension: Secondary | ICD-10-CM

## 2022-02-25 DIAGNOSIS — I25118 Atherosclerotic heart disease of native coronary artery with other forms of angina pectoris: Secondary | ICD-10-CM

## 2022-02-25 DIAGNOSIS — I700 Atherosclerosis of aorta: Secondary | ICD-10-CM

## 2022-02-25 DIAGNOSIS — E7800 Pure hypercholesterolemia, unspecified: Secondary | ICD-10-CM

## 2022-02-25 MED ORDER — METOPROLOL SUCCINATE ER 25 MG PO TB24: 12.5000 mg | ORAL_TABLET | Freq: Every day | ORAL | 3 refills | Status: DC

## 2022-02-25 NOTE — Patient Instructions (Addendum)
Medication Instructions:  Please start metoprolol succinate 12.5 mg daily Cut the 25 mg in 1/2 daily  If you need a refill on your cardiac medications before your next appointment, please call your pharmacy.   Lab work: No new labs needed  Testing/Procedures: No new testing needed  Follow-Up: At [facility], you and your health needs are our priority.  As part of our continuing mission to provide you with exceptional heart care, we have created designated Provider Care Teams.  These Care Teams include your primary Cardiologist (physician) and Advanced Practice Providers (APPs -  Physician Assistants and Nurse Practitioners) who all work together to provide you with the care you need, when you need it.  You will need a follow up appointment in 12 months  Providers on your designated Care Team:   Murray Hodgkins, NP Christell Faith, PA-C Cadence Kathlen Mody, [place]  COVID-19 Vaccine Information can be found at: [email or web address].uk For questions related to vaccine distribution or appointments, please email [email or web address] or call 2708594495.

## 2022-02-25 NOTE — Addendum Note (Signed)
Addended by: Minna Merritts on: 02/25/2022 02:44 PM   Modules accepted: Orders

## 2022-06-24 ENCOUNTER — Encounter (INDEPENDENT_AMBULATORY_CARE_PROVIDER_SITE_OTHER): Payer: Self-pay

## 2022-07-29 ENCOUNTER — Other Ambulatory Visit (INDEPENDENT_AMBULATORY_CARE_PROVIDER_SITE_OTHER): Payer: Self-pay | Admitting: Vascular Surgery

## 2022-07-29 DIAGNOSIS — I7143 Infrarenal abdominal aortic aneurysm, without rupture: Secondary | ICD-10-CM

## 2022-07-30 ENCOUNTER — Ambulatory Visit (INDEPENDENT_AMBULATORY_CARE_PROVIDER_SITE_OTHER): Payer: Medicare HMO

## 2022-07-30 ENCOUNTER — Ambulatory Visit (INDEPENDENT_AMBULATORY_CARE_PROVIDER_SITE_OTHER): Payer: Medicare HMO | Admitting: Vascular Surgery

## 2022-07-30 ENCOUNTER — Encounter (INDEPENDENT_AMBULATORY_CARE_PROVIDER_SITE_OTHER): Payer: Self-pay | Admitting: Vascular Surgery

## 2022-07-30 VITALS — BP 126/70 | HR 59 | Resp 14 | Ht 70.0 in | Wt 264.0 lb

## 2022-07-30 DIAGNOSIS — C772 Secondary and unspecified malignant neoplasm of intra-abdominal lymph nodes: Secondary | ICD-10-CM

## 2022-07-30 DIAGNOSIS — I10 Essential (primary) hypertension: Secondary | ICD-10-CM | POA: Diagnosis not present

## 2022-07-30 DIAGNOSIS — E7800 Pure hypercholesterolemia, unspecified: Secondary | ICD-10-CM

## 2022-07-30 DIAGNOSIS — I7143 Infrarenal abdominal aortic aneurysm, without rupture: Secondary | ICD-10-CM

## 2022-07-30 DIAGNOSIS — C679 Malignant neoplasm of bladder, unspecified: Secondary | ICD-10-CM | POA: Diagnosis not present

## 2022-07-30 NOTE — Assessment & Plan Note (Signed)
Duplex today shows a stable 2.7 to 2.8 cm aorta which would be consistent with studies at or just below 3 cm over the past couple of years.  No role for intervention at this size.  Recheck in 1 year with duplex.

## 2022-07-30 NOTE — Progress Notes (Signed)
MRN : [id number]  Christian [name] is a 81 y.o. (Apr 25, 1941) male who presents with chief complaint of  Chief Complaint  Patient presents with   Follow-up    ultraound  .  History of Present Illness: Patient returns today in follow up of abdominal aortic aneurysm.  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  He has no aneurysm related symptoms. Specifically, the patient denies new back or abdominal pain, or signs of peripheral embolization.  Duplex today shows a stable 2.7 to 2.8 cm aorta which would be consistent with studies at or just below 3 cm over the past couple of years.  Current Outpatient Medications  Medication Sig Dispense Refill   apixaban (ELIQUIS) 5 MG TABS tablet Take 1 tablet (5 mg total) by mouth 2 (two) times daily. 180 tablet 3   atorvastatin (LIPITOR) 10 MG tablet Take 1 tablet (10 mg total) by mouth daily at 6 PM. 90 tablet 3   calcium carbonate (TUMS EX) 750 MG chewable tablet Chew by mouth.     fluticasone (FLONASE) 50 MCG/ACT nasal spray Place 2 sprays into both nostrils as needed.     gabapentin (NEURONTIN) 300 MG capsule Take 600 mg by mouth 2 (two) times daily.     magnesium oxide (MAG-OX) 400 MG tablet Take 1 tablet by mouth daily.     metoprolol succinate (TOPROL-XL) 25 MG 24 hr tablet Take 0.5 tablets (12.5 mg total) by mouth daily. Take with or immediately following a meal. 45 tablet 3   nitroGLYCERIN (NITROSTAT) 0.4 MG SL tablet Place 1 tablet (0.4 mg total) under the tongue every 5 (five) minutes as needed for chest pain. 25 tablet 6   Omega-3 Fatty Acids (RA FISH OIL) 1000 MG CAPS Take 1,000 mg by mouth 2 (two) times daily.     traMADol (ULTRAM) 50 MG tablet Take 50 mg by mouth every 6 (six) hours as needed.     benzonatate (TESSALON) 100 MG capsule Take by mouth. (Patient not taking: Reported on 07/30/2022)     No current facility-administered medications for this visit.    Past Medical History:  Diagnosis Date   Aortic  aneurysm ([place])    followed by vascular surgery yearly.   Bladder cancer [facility])    s/p bladder resection. [place]   Coronary artery disease    AMI anterior wall 1997; s/p PTCA to LAD 11/1995 [facility].   DVT (deep venous thrombosis) ([facility]) 10/07/2007   Glucose intolerance (impaired glucose tolerance)    Hydrocele    Hyperlipidemia    Hypertension    MI (myocardial infarction) ([place]) 1997   Nephrolithiasis    Sleep apnea     Past Surgical History:  Procedure Laterality Date   [place]   bladder removed     CARDIAC CATHETERIZATION     COLONOSCOPY  08/27/2011   normal.  [facility].   HYDROCELE EXCISION     PROSTATE SURGERY     prostatectomy with bladder resection.     Social History   Tobacco Use   Smoking status: Never   Smokeless tobacco: Never   Tobacco comments:    tobacco use- no   Vaping Use   Vaping Use: Never used  Substance Use Topics   Alcohol use: No   Drug use: No      Family History  Problem Relation Age of Onset   Stroke Mother    Diabetes Mother    Lung cancer Father  Cancer Father        lung cancer   Diabetes Sister    Diabetes Brother      Allergies  Allergen Reactions   Ceftriaxone Hives    Tolerated cefazolin 09/2021 without reaction   Other Hives   Sulfasalazine Hives   Sulfa Antibiotics Hives    REVIEW OF SYSTEMS (Negative unless checked)   Constitutional: '[]'$ Weight loss  '[]'$ Fever  '[]'$ Chills Cardiac: '[]'$ Chest pain   '[]'$ Chest pressure   '[]'$ Palpitations   '[]'$ Shortness of breath when laying flat   '[]'$ Shortness of breath at rest   '[]'$ Shortness of breath with exertion. Vascular:  '[]'$ Pain in legs with walking   '[]'$ Pain in legs at rest   '[]'$ Pain in legs when laying flat   '[]'$ Claudication   '[]'$ Pain in feet when walking  '[]'$ Pain in feet at rest  '[]'$ Pain in feet when laying flat   '[x]'$ History of DVT   '[]'$ Phlebitis   '[]'$ Swelling in legs   '[]'$ Varicose veins   '[]'$ Non-healing ulcers Pulmonary:   '[]'$ Uses home oxygen   '[]'$ Productive cough    '[]'$ Hemoptysis   '[]'$ Wheeze  '[]'$ COPD   '[]'$ Asthma Neurologic:  '[]'$ Dizziness  '[]'$ Blackouts   '[]'$ Seizures   '[]'$ History of stroke   '[]'$ History of TIA  '[]'$ Aphasia   '[]'$ Temporary blindness   '[]'$ Dysphagia   '[]'$ Weakness or numbness in arms   '[]'$ Weakness or numbness in legs Musculoskeletal:  '[x]'$ Arthritis   '[]'$ Joint swelling   '[]'$ Joint pain   '[]'$ Low back pain Hematologic:  '[]'$ Easy bruising  '[]'$ Easy bleeding   '[]'$ Hypercoagulable state   '[]'$ Anemic   Gastrointestinal:  '[]'$ Blood in stool   '[]'$ Vomiting blood  '[]'$ Gastroesophageal reflux/heartburn   '[]'$ Abdominal pain Genitourinary:  '[x]'$ Chronic kidney disease   '[]'$ Difficult urination  '[]'$ Frequent urination  '[]'$ Burning with urination   '[]'$ Hematuria Skin:  '[]'$ Rashes   '[]'$ Ulcers   '[]'$ Wounds Psychological:  '[]'$ History of anxiety   '[]'$  History of major depression.   Physical Examination  BP 126/70 (BP Location: Left Arm)   Pulse (!) 59   Resp 14   Ht '5\' 10"'$  (1.778 m)   Wt 264 lb (119.7 kg)   BMI 37.88 kg/m  Gen:  WD/WN, NAD Head: [place]/AT, No temporalis wasting. Ear/Nose/Throat: Hearing grossly intact, nares w/o erythema or drainage Eyes: Conjunctiva clear. Sclera non-icteric Neck: Supple.  Trachea midline Pulmonary:  Good air movement, no use of accessory muscles.  Cardiac: RRR, no JVD Vascular:  Vessel Right Left  Radial Palpable Palpable                                   Gastrointestinal: soft, non-tender/non-distended. No guarding/reflex. No increased aortic impulse. Musculoskeletal: M/S 5/5 throughout.  No deformity or atrophy. No edema. Neurologic: Sensation grossly intact in extremities.  Symmetrical.  Speech is fluent.  Psychiatric: Judgment intact, Mood & affect appropriate for pt's clinical situation. Dermatologic: No rashes or ulcers noted.  No cellulitis or open wounds.      Labs No results found for this or any previous visit (from the past 2160 hour(s)).  Radiology No results found.  Assessment/Plan Hyperlipidemia lipid control important in reducing the  progression of atherosclerotic disease. Continue statin therapy     Bladder cancer S/p resection by Urology and getting chemo and immune therapy   Essential hypertension blood pressure control important in reducing the progression of atherosclerotic disease and aneurysmal growth. On appropriate oral medications.  Abdominal aortic aneurysm Duplex today shows a stable 2.7 to 2.8 cm aorta which would be  consistent with studies at or just below 3 cm over the past couple of years.  No role for intervention at this size.  Recheck in 1 year with duplex.    Leotis Pain, MD  07/30/2022 10:48 AM    This note was created with Dragon medical transcription system.  Any errors from dictation are purely unintentional

## 2022-08-26 DIAGNOSIS — I4891 Unspecified atrial fibrillation: Secondary | ICD-10-CM | POA: Diagnosis present

## 2022-08-26 DIAGNOSIS — I480 Paroxysmal atrial fibrillation: Secondary | ICD-10-CM | POA: Diagnosis present

## 2022-10-11 ENCOUNTER — Encounter: Payer: Self-pay | Admitting: *Deleted

## 2022-10-11 ENCOUNTER — Encounter: Payer: Medicare HMO | Attending: Family Medicine | Admitting: *Deleted

## 2022-10-11 VITALS — BP 112/72 | Ht 70.0 in | Wt 250.0 lb

## 2022-10-11 DIAGNOSIS — Z713 Dietary counseling and surveillance: Secondary | ICD-10-CM | POA: Insufficient documentation

## 2022-10-11 DIAGNOSIS — E0822 Diabetes mellitus due to underlying condition with diabetic chronic kidney disease: Secondary | ICD-10-CM | POA: Diagnosis not present

## 2022-10-11 DIAGNOSIS — E1165 Type 2 diabetes mellitus with hyperglycemia: Secondary | ICD-10-CM

## 2022-10-11 DIAGNOSIS — Z794 Long term (current) use of insulin: Secondary | ICD-10-CM | POA: Diagnosis not present

## 2022-10-11 DIAGNOSIS — N1832 Chronic kidney disease, stage 3b: Secondary | ICD-10-CM | POA: Insufficient documentation

## 2022-10-11 NOTE — Progress Notes (Signed)
Diabetes Self-Management Education  Visit Type: First/Initial  Appt. Start Time: 1320 Appt. End Time: [id number]  10/11/2022  [name], identified by name and date of birth, is a 82 y.o. male with a diagnosis of Diabetes: Type 2.   ASSESSMENT  Blood pressure 112/72, height 5' 10"$  (1.778 m), weight 250 lb (113.4 kg). Body mass index is 35.87 kg/m.   Diabetes Self-Management Education - 10/11/22 1548       Visit Information   Visit Type First/Initial      Initial Visit   Diabetes Type Type 2    Date Diagnosed 8 weeks    Are you currently following a meal plan? No    Are you taking your medications as prescribed? Yes      Health Coping   How would you rate your overall health? Fair      Psychosocial Assessment   Patient Belief/Attitude about Diabetes Other (comment)   "question why"   What is the hardest part about your diabetes right now, causing you the most concern, or is the most worrisome to you about your diabetes?   Making healty food and beverage choices    Self-care barriers None    Self-management support Doctor's office;Family    Other persons present Spouse/SO    Patient Concerns Nutrition/Meal planning;Glycemic Control;Monitoring;Healthy Lifestyle    Special Needs None    Preferred Learning Style Auditory;Visual;Hands on    [place] in progress    How often do you need to have someone help you when you read instructions, pamphlets, or other written materials from your doctor or pharmacy? 1 - Never    What is the last grade level you completed in school? 12th      Pre-Education Assessment   Patient understands the diabetes disease and treatment process. Needs Instruction    Patient understands incorporating nutritional management into lifestyle. Needs Instruction    Patient undertands incorporating physical activity into lifestyle. Needs Instruction    Patient understands using medications safely. Needs Instruction    Patient understands  monitoring blood glucose, interpreting and using results Needs Review    Patient understands prevention, detection, and treatment of acute complications. Needs Instruction    Patient understands prevention, detection, and treatment of chronic complications. Needs Instruction    Patient understands how to develop strategies to address psychosocial issues. Needs Instruction    Patient understands how to develop strategies to promote health/change behavior. Needs Instruction      Complications   Last HgB A1C per patient/outside source 11.7 %   08/27/2022   How often do you check your blood sugar? 1-2 times/day   Pt reports he sends PCP blood sugar readings through My Chart.   Fasting Blood glucose range (mg/dL) >200   FBG's 266-407 mg/dL   Postprandial Blood glucose range (mg/dL) >200   pp's 238-485 mg/dL   Number of hyperglycemic episodes ( >266m/dL): Daily    Can you tell when your blood sugar is high? Yes    What do you do if your blood sugar is high? reading on meter - thirsty, increased urine via urostomy    Have you had a dilated eye exam in the past 12 months? Yes    Have you had a dental exam in the past 12 months? Yes    Are you checking your feet? Yes    How many days per week are you checking your feet? 7      Dietary Intake   Breakfast boiled eggs, bacon,  lettuce and tomato on low carb wrap; pork chop and boiled eggs    Lunch usually a snack - beef jerky, cottage cheese and fruit (grapes, orange, apple, blueberries, cherries), celery and peanut butter or cream cheese    Snack (afternoon) nuts    Dinner pork, roast, chicken, vegetable soup, rarely potatoes, green beans, beans, peas, cauliflower rice, cabbage, greens, broccoli, caulilower, beets    Beverage(s) water      Activity / Exercise   Activity / Exercise Type ADL's      Patient Education   Previous Diabetes Education No    Disease Pathophysiology Definition of diabetes, type 1 and 2, and the diagnosis of diabetes;Factors  that contribute to the development of diabetes;Explored patient's options for treatment of their diabetes    Healthy Eating Role of diet in the treatment of diabetes and the relationship between the three main macronutrients and blood glucose level;Food label reading, portion sizes and measuring food.;Reviewed blood glucose goals for pre and post meals and how to evaluate the patients' food intake on their blood glucose level.;Meal timing in regards to the patients' current diabetes medication.    Being Active Role of exercise on diabetes management, blood pressure control and cardiac health.    Medications Taught/reviewed insulin/injectables, injection, site rotation, insulin/injectables storage and needle disposal.;Reviewed patients medication for diabetes, action, purpose, timing of dose and side effects.    Monitoring Purpose and frequency of SMBG.;Taught/discussed recording of test results and interpretation of SMBG.;Identified appropriate SMBG and/or A1C goals.    Acute complications Taught prevention, symptoms, and  treatment of hypoglycemia - the 15 rule.    Chronic complications Relationship between chronic complications and blood glucose control    Diabetes Stress and Support Identified and addressed patients feelings and concerns about diabetes;Role of stress on diabetes      Individualized Goals (developed by patient)   Reducing Risk Other (comment)   improve blood sugars, prevent diabetes complications, lead a healthier lifestyle     Outcomes   Expected Outcomes Demonstrated interest in learning. Expect positive outcomes    Future DMSE 4-6 wks         Individualized Plan for Diabetes Self-Management Training:   Learning Objective:  Patient will have a greater understanding of diabetes self-management. Patient education plan is to attend individual and/or group sessions per assessed needs and concerns.   Plan:   Patient Instructions  Check blood sugars 2 x day before breakfast  and 2 hrs after supper every day Bring blood sugar records to the next class  Exercise:  Don't sit for long periods at one time  Eat 3 meals day,   1-2  snacks a day Space meals 4-6 hours apart Don't skip meals - eat 1 protein and 1 carbohydrate serving  Carry fast acting glucose and a snack at all times Rotate injection sites  Return for classes on:    Expected Outcomes:  Demonstrated interest in learning. Expect positive outcomes  Education material provided:  General Meal Planning Guidelines Simple Meal Plan Glucose tablets Symptoms, causes and treatments of Hypoglycemia  If problems or questions, patient to contact team via:   Johny Drilling, RN, [place] [phone]  Future DSME appointment: 4-6 wks November 07, 2022 for Diabetes Class 1

## 2022-10-11 NOTE — Patient Instructions (Signed)
Check blood sugars 2 x day before breakfast and 2 hrs after supper every day Bring blood sugar records to the next class  Exercise:  Don't sit for long periods at one time  Eat 3 meals day,   1-2  snacks a day Space meals 4-6 hours apart Don't skip meals - eat 1 protein and 1 carbohydrate serving  Carry fast acting glucose and a snack at all times Rotate injection sites  Return for classes on:

## 2022-11-07 ENCOUNTER — Encounter: Payer: Self-pay | Admitting: Dietician

## 2022-11-07 ENCOUNTER — Encounter: Payer: Medicare HMO | Attending: Family Medicine | Admitting: Dietician

## 2022-11-07 VITALS — Wt 242.6 lb

## 2022-11-07 DIAGNOSIS — Z713 Dietary counseling and surveillance: Secondary | ICD-10-CM | POA: Diagnosis not present

## 2022-11-07 DIAGNOSIS — N1832 Chronic kidney disease, stage 3b: Secondary | ICD-10-CM | POA: Diagnosis not present

## 2022-11-07 DIAGNOSIS — E1122 Type 2 diabetes mellitus with diabetic chronic kidney disease: Secondary | ICD-10-CM | POA: Diagnosis not present

## 2022-11-07 DIAGNOSIS — Z794 Long term (current) use of insulin: Secondary | ICD-10-CM | POA: Insufficient documentation

## 2022-11-07 DIAGNOSIS — E1165 Type 2 diabetes mellitus with hyperglycemia: Secondary | ICD-10-CM | POA: Insufficient documentation

## 2022-11-07 NOTE — Progress Notes (Signed)

## 2022-11-14 ENCOUNTER — Encounter: Payer: Medicare HMO | Admitting: Dietician

## 2022-11-14 ENCOUNTER — Encounter: Payer: Self-pay | Admitting: Dietician

## 2022-11-14 VITALS — Ht 70.0 in | Wt 246.3 lb

## 2022-11-14 DIAGNOSIS — E1165 Type 2 diabetes mellitus with hyperglycemia: Secondary | ICD-10-CM

## 2022-11-14 NOTE — Progress Notes (Signed)

## 2022-11-21 VITALS — BP 110/70 | Ht 70.0 in | Wt 250.1 lb

## 2022-11-21 DIAGNOSIS — E1165 Type 2 diabetes mellitus with hyperglycemia: Secondary | ICD-10-CM

## 2022-11-25 ENCOUNTER — Encounter: Payer: Medicare HMO | Attending: Family Medicine | Admitting: Physician Assistant

## 2022-11-25 DIAGNOSIS — N1832 Chronic kidney disease, stage 3b: Secondary | ICD-10-CM | POA: Diagnosis not present

## 2022-11-25 DIAGNOSIS — Z713 Dietary counseling and surveillance: Secondary | ICD-10-CM | POA: Insufficient documentation

## 2022-11-25 DIAGNOSIS — E1165 Type 2 diabetes mellitus with hyperglycemia: Secondary | ICD-10-CM | POA: Insufficient documentation

## 2022-11-25 DIAGNOSIS — Z794 Long term (current) use of insulin: Secondary | ICD-10-CM | POA: Insufficient documentation

## 2022-11-25 DIAGNOSIS — E1122 Type 2 diabetes mellitus with diabetic chronic kidney disease: Secondary | ICD-10-CM | POA: Insufficient documentation

## 2022-11-26 NOTE — Progress Notes (Signed)
Christian, GOHN [name] ([id number]) [id number] Page 1 of 4 Visit Report for 11/25/2022 Abuse Risk Screen Details Patient Name: Date of Service: Christian [name]. 11/25/2022 2:00 PM Medical Record Number: [id number] Patient Account Number: [id number] Date of Birth/Sex: Treating RN: 1941/07/04 (82 y.o. Christian [name] Primary Care Christian [name]: Christian [name] Other Clinician: Referring Christian [name]: Treating Christian [name]/Extender: Christian [name] in Treatment: 0 Abuse Risk Screen Items Answer Electronic Signature(s) Signed: 11/25/2022 4:32:14 PM By: Christian Loud MSN RN CNS WTA Entered By: Christian [name] on 11/25/2022 15:31:58 -------------------------------------------------------------------------------- Activities of Daily Living Details Patient Name: Date of Service: Christian [name]. 11/25/2022 2:00 PM Medical Record Number: [id number] Patient Account Number: [id number] Date of Birth/Sex: Treating RN: April 15, 1941 (82 y.o. Christian [name]: Christian [name] Other Clinician: Referring Mubashir Mallek: Treating Jahfari Ambers/Extender: Christian [name] in Treatment: 0 Activities of Daily Living Items Answer Activities of Daily Living (Please select one for each item) Drive Automobile Completely Able T Medications ake Completely Able Use T elephone Completely Able Care for Appearance Completely Able Use T oilet Completely Able Bath / Shower Completely Able Dress Self Completely Able Feed Self Completely Able Walk Completely Able Get In / Out Bed Completely Able Housework Completely Able Prepare Meals Completely [place] for Self Completely Able Electronic Signature(s) Signed: 11/25/2022 4:32:14 PM By: Christian Loud MSN RN CNS WTA Entered By: Christian [name] on 11/25/2022 15:32:04 -------------------------------------------------------------------------------- Education Screening Details Patient Name: Date  of Service: Christian [name]. 11/25/2022 2:00 PM Medical Record Number: [id number] Patient Account Number: [id number] Date of Birth/Sex: Treating RN: [birth year] (82 y.o. Christian [name]: Christian [name] Other Clinician: Referring Leba Tibbitts: Treating Ayaat Jansma/Extender: Christian [name] in Treatment: 0 Learning Preferences/Education Level/Primary Language Learning Preference: Explanation, Demonstration Christian [name], Christian [name] ([id number]) [id number] Page 2 of 4 Highest Education Level: College or Above Preferred Language: English Cognitive Barrier Language Barrier: No Translator Needed: No Memory Deficit: No Emotional Barrier: No Cultural/Religious Beliefs Affecting Medical Care: No Physical Barrier Impaired Vision: Yes Impaired Hearing: No Decreased Hand dexterity: No Knowledge/Comprehension Knowledge Level: High Comprehension Level: High Ability to understand written instructions: High Ability to understand verbal instructions: High Motivation Anxiety Level: Calm Cooperation: Cooperative Education Importance: Acknowledges Need Interest in Health Problems: Asks Questions Perception: Coherent Willingness to Engage in Self-Management High Activities: Readiness to Engage in Self-Management High Activities: Electronic Signature(s) Signed: 11/25/2022 4:32:14 PM By: Christian Loud MSN RN CNS WTA Entered By: Christian [name] on 11/25/2022 15:32:11 -------------------------------------------------------------------------------- Fall Risk Assessment Details Patient Name: Date of Service: Christian [name], Christian [name] [name]. 11/25/2022 2:00 PM Medical Record Number: [id number] Patient Account Number: [id number] Date of Birth/Sex: Treating RN: 1941-03-31 (82 y.o. Christian [name]: Christian [name] Other Clinician: Referring Rissa Turley: Treating Jennifermarie Franzen/Extender: Christian [name] in Treatment: 0 Fall Risk Assessment  Items Have you had 2 or more falls in the last 12 monthso 0 No Have you had any fall that resulted in injury in the last 12 monthso 0 No FALLS RISK SCREEN History of falling - immediate or within 3 months 0 No Secondary diagnosis (Do you have 2 or more medical diagnoseso) 0 No Ambulatory aid None/bed rest/wheelchair/nurse 0 No Crutches/cane/walker 0 No Furniture 0 No Intravenous therapy Access/Saline/Heparin Lock 0 No Gait/Transferring Normal/ bed rest/ wheelchair 0 No Weak (short steps with or without shuffle, stooped but able to lift head while walking, may seek 0 No support from furniture)  Impaired (short steps with shuffle, may have difficulty arising from chair, head down, impaired 0 No balance) Mental Status Oriented to own ability 0 Yes Electronic Signature(s) Signed: 11/25/2022 4:32:14 PM By: Christian Loud MSN RN CNS WTA Entered By: Christian [name] on 11/25/2022 15:32:14 Christian [name], Christian [name] ([id number]) [id number] Page 3 of 4 -------------------------------------------------------------------------------- Foot Assessment Details Patient Name: Date of Service: Christian [name]. 11/25/2022 2:00 PM Medical Record Number: [id number] Patient Account Number: [id number] Date of Birth/Sex: Treating RN: 10-30-1940 (82 y.o. Christian [name]: Christian [name] Other Clinician: Referring Tayshun Gappa: Treating Zunaira Lamy/Extender: Christian [name] in Treatment: 0 Foot Assessment Items Site Locations + = Sensation present, - = Sensation absent, C = Callus, U = Ulcer [name] = Redness, W = Warmth, M = Maceration, PU = Pre-ulcerative lesion F = Fissure, S = Swelling, D = Dryness Assessment Right: Left: Other Deformity: No No Prior Foot Ulcer: No No Prior Amputation: No No Charcot Joint: No No Ambulatory Status: Ambulatory Without Help Gait: Steady Electronic Signature(s) Signed: 11/25/2022 4:32:14 PM By: Christian Loud MSN RN CNS  WTA Entered By: Christian [name] on 11/25/2022 14:50:10 -------------------------------------------------------------------------------- Nutrition Risk Screening Details Patient Name: Date of Service: Christian [name]. 11/25/2022 2:00 PM Medical Record Number: [id number] Patient Account Number: [id number] Date of Birth/Sex: Treating RN: 17-Jul-1941 (82 y.o. Christian [name] Primary Care Mycheal Veldhuizen: Christian [name] Other Clinician: Referring Coda Filler: Treating Jamirra Curnow/Extender: Christian [name] in Treatment: 0 Height (in): 69 Weight (lbs): 246 Body Mass Index (BMI): 36.3 Nutrition Risk Screening Items Score Screening Christian [name], Christian [name] ([id number]) [phone] [id number] Page 4 of 4 NUTRITION RISK SCREEN: I have an illness or condition that made me change the kind and/or amount of food I eat 0 No I eat fewer than two meals per day 0 No I eat few fruits and vegetables, or milk products 0 No I have three or more drinks of beer, liquor or wine almost every day 0 No I have tooth or mouth problems that make it hard for me to eat 0 No I don't always have enough money to buy the food I need 0 No I eat alone most of the time 0 No I take three or more different prescribed or over-the-counter drugs a day 1 Yes Without wanting to, I have lost or gained 10 pounds in the last six months 0 No I am not always physically able to shop, cook and/or feed myself 0 No Nutrition Protocols Good Risk Protocol 0 No interventions needed Moderate Risk Protocol High Risk Proctocol Risk Level: Good Risk Score: 1 Electronic Signature(s) Signed: 11/25/2022 4:32:14 PM By: Christian Loud MSN RN CNS WTA Entered By: Christian [name] on 11/25/2022 14:47:47

## 2022-11-26 NOTE — Progress Notes (Signed)
[name], [name] ([id number]) [id number] Page 1 of 7 Visit Report for 11/25/2022 Allergy List Details Patient Name: Date of Service: Christian [name] Christian [name]. 11/25/2022 2:00 PM Medical Record Number: [id number] Patient Account Number: [id number] Date of Birth/Sex: Treating RN: Christian [name], Christian [name] (82 y.o. Christian [name] Primary Care Camey Edell: Reginia Forts Other Clinician: Referring Yahsir Wickens: Treating Laneice Meneely/Extender: Kathi Ludwig Weeks in Treatment: 0 Allergies Active Allergies sulfasalazine Severity: Moderate ceftriaxone Allergy Notes Electronic Signature(s) Signed: 11/25/2022 4:32:14 PM By: Rosalio Loud MSN RN CNS WTA Entered By: Rosalio Loud on 11/25/2022 14:38:05 -------------------------------------------------------------------------------- Arrival Information Details Patient Name: Date of Service: Christian [name], Christian [name] Christian [name]. 11/25/2022 2:00 PM Medical Record Number: [id number] Patient Account Number: [id number] Date of Birth/Sex: Treating RN: [name] (82 y.o. Christian [name] Primary Care Demontae Antunes: Reginia Forts Other Clinician: Referring Irfan Veal: Treating Jajaira Ruis/Extender: Kathi Ludwig Weeks in Treatment: 0 Visit Information Patient Arrived: Ambulatory Arrival Time: 14:21 Accompanied By: wife Transfer Assistance: None Patient Identification Verified: Yes Secondary Verification Process Completed: Yes Patient Requires Transmission-Based Precautions: No Patient Has Alerts: Yes Patient Alerts: Patient on Blood Thinner Diabetes type II Eliquis Electronic Signature(s) Signed: 11/25/2022 4:32:14 PM By: Rosalio Loud MSN RN CNS WTA Entered By: Rosalio Loud on 11/25/2022 15:51:14 -------------------------------------------------------------------------------- Clinic Level of Care Assessment Details Patient Name: Date of Service: Christian [name] Christian [name]. 11/25/2022 2:00 PM Medical Record Number: [id number] Patient Account Number: [id number] Date of  Birth/Sex: Treating RN: Christian [name], Christian [name] (82 y.o. Christian [name] Primary Care Enid Maultsby: Reginia Forts Other Clinician: Referring Heber Hoog: Treating Shatiqua Heroux/Extender: Kathi Ludwig Weeks in Treatment: 0 Clinic Level of Care Assessment Items TOOL 2 Quantity Score [name], [name] ([id number]) [id number] Page 2 of 7 X- 1 0 Use when only an EandM is performed on the INITIAL visit ASSESSMENTS - Nursing Assessment / Reassessment X- 1 20 General Physical Exam (combine w/ comprehensive assessment (listed just below) when performed on new pt. evals) X- 1 25 Comprehensive Assessment (HX, ROS, Risk Assessments, Wounds Hx, etc.) ASSESSMENTS - Christian and Skin A ssessment / Reassessment X - Simple Christian Assessment / Reassessment - one Christian 1 5 []  - 0 Complex Christian Assessment / Reassessment - multiple wounds []  - 0 Dermatologic / Skin Assessment (not related to Christian area) ASSESSMENTS - Ostomy and/or Continence Assessment and Care []  - 0 Incontinence Assessment and Management []  - 0 Ostomy Care Assessment and Management (repouching, etc.) PROCESS - Coordination of Care X - Simple Patient / Family Education for ongoing care 1 15 []  - 0 Complex (extensive) Patient / Family Education for ongoing care X- 1 10 Staff obtains Programmer, systems, Records, T Results / Process Orders est []  - 0 Staff telephones HHA, Nursing Homes / Clarify orders / etc []  - 0 Routine Transfer to another Facility (non-emergent condition) []  - 0 Routine Hospital Admission (non-emergent condition) X- 1 15 New Admissions / Biomedical engineer / Ordering NPWT Apligraf, etc. , []  - 0 Emergency Hospital Admission (emergent condition) X- 1 10 Simple Discharge Coordination []  - 0 Complex (extensive) Discharge Coordination PROCESS - Special Needs []  - 0 Pediatric / Minor Patient Management []  - 0 Isolation Patient Management []  - 0 Hearing / Language / Visual special needs []  -  0 Assessment of Community assistance (transportation, D/C planning, etc.) []  - 0 Additional assistance / Altered mentation []  - 0 Support Surface(s) Assessment (bed, cushion, seat, etc.) INTERVENTIONS - Christian Cleansing / Measurement X- 1 5 Christian Imaging (photographs - any number of wounds) []  - 0 Christian Tracing (instead  of photographs) []  - 0 Simple Christian Measurement - one Christian []  - 0 Complex Christian Measurement - multiple wounds []  - 0 Simple Christian Cleansing - one Christian []  - 0 Complex Christian Cleansing - multiple wounds INTERVENTIONS - Christian Dressings []  - 0 Small Christian Dressing one or multiple wounds []  - 0 Medium Christian Dressing one or multiple wounds []  - 0 Large Christian Dressing one or multiple wounds []  - 0 Application of Medications - injection INTERVENTIONS - Miscellaneous []  - 0 External ear exam []  - 0 Specimen Collection (cultures, biopsies, blood, body fluids, etc.) []  - 0 Specimen(s) / Culture(s) sent or taken to Lab for analysis Christian [name], Christian [name] ([id number]) [id number] Page 3 of 7 []  - 0 Patient Transfer (multiple staff / Civil Service fast streamer / Similar devices) []  - 0 Simple Staple / Suture removal (25 or less) []  - 0 Complex Staple / Suture removal (26 or more) []  - 0 Hypo / Hyperglycemic Management (close monitor of Blood Glucose) X- 1 15 Ankle / Brachial Index (ABI) - do not check if billed separately Has the patient been seen at the hospital within the last three years: Yes Total Score: 120 Level Of Care: New/Established - Level 4 Electronic Signature(s) Signed: 11/25/2022 4:32:14 PM By: Rosalio Loud MSN RN CNS WTA Entered By: Rosalio Loud on 11/25/2022 15:54:57 -------------------------------------------------------------------------------- Encounter Discharge Information Details Patient Name: Date of Service: Christian [name], Christian [name] Christian [name]. 11/25/2022 2:00 PM Medical Record Number: [id number] Patient Account Number: [id number] Date of Birth/Sex:  Treating RN: Christian [name][birth year] (82 y.o. Christian [name] Primary Care Ngina Royer: Reginia Forts Other Clinician: Referring Bettyjean Stefanski: Treating Vonceil Upshur/Extender: Kathi Ludwig Weeks in Treatment: 0 Encounter Discharge Information Items Discharge Condition: Stable Ambulatory Status: Ambulatory Discharge Destination: Home Transportation: Private Auto Accompanied By: wife Schedule Follow-up Appointment: Yes Clinical Summary of Care: Electronic Signature(s) Signed: 11/25/2022 4:32:14 PM By: Rosalio Loud MSN RN CNS WTA Entered By: Rosalio Loud on 11/25/2022 16:18:40 -------------------------------------------------------------------------------- Lower Extremity Assessment Details Patient Name: Date of Service: Christian [name] Christian [name]. 11/25/2022 2:00 PM Medical Record Number: [id number] Patient Account Number: [id number] Date of Birth/Sex: Treating RN: Christian [name]-[birth year] (82 y.o. Christian [name] Primary Care Menna Abeln: Reginia Forts Other Clinician: Referring Brenley Priore: Treating Charlot Gouin/Extender: Kathi Ludwig Weeks in Treatment: 0 Edema Assessment Assessed: [Left: Yes] [Right: Yes] [Left: Edema] [Right: :] Calf Left: Right: Point of Measurement: 37 cm From Medial Instep 42 cm 41.5 cm Ankle Left: Right: Point of Measurement: 14 cm From Medial Instep 29 cm 27.5 cm Knee To Floor Left: Right: From Medial Instep 44 cm 44 cm Vascular Assessment Christian [name], Christian [name] ([id number]) [id number] Page 4 of 7] Pulses: Dorsalis Pedis Palpable: [Left:Yes] [Right:Yes] Doppler Audible: [Left:Yes] [Right:Yes] Blood Pressure: Brachial: [Left:114] [Right:114] Ankle: [Left:Dorsalis Pedis: 136 1.19] [Right:Dorsalis Pedis: 134 1.18] Electronic Signature(s) Signed: 11/25/2022 4:32:14 PM By: Rosalio Loud MSN RN CNS WTA Entered By: Rosalio Loud on 11/25/2022 15:05:21 -------------------------------------------------------------------------------- Multi Christian Chart  Details Patient Name: Date of Service: Christian [name], Christian [name] Christian [name]. 11/25/2022 2:00 PM Medical Record Number: [id number] Patient Account Number: [id number] Date of Birth/Sex: Treating RN: [birth year]Christian [name] (82 y.o. Christian [name] Primary Care Keyion Knack: Reginia Forts Other Clinician: Referring Wilkes Potvin: Treating Lenice Koper/Extender: Kathi Ludwig Weeks in Treatment: 0 Vital Signs Height(in): 69 Pulse(bpm): 53 Weight(lbs): 246 Blood Pressure(mmHg): 103/89 Body Mass Index(BMI): 36.3 Temperature(F): 97.7 Respiratory Rate(breaths/min): 18 [1:Photos:] [N/A:N/A] Left, Anterior Lower Leg N/A N/A Christian Location: Gradually Appeared N/A N/A Wounding Event: Venous Leg Ulcer N/A N/A Primary Etiology: Coronary Artery Disease, N/A  N/A Comorbid History: Hypertension, Myocardial Infarction, Type II Diabetes, Neuropathy, Received Chemotherapy 11/11/2022 N/A N/A Date Acquired: 0 N/A N/A Weeks of Treatment: Open N/A N/A Christian Status: No N/A N/A Christian Recurrence: 1x0.5x0.1 N/A N/A Measurements L x W x D (cm) 0.393 N/A N/A A (cm) : rea 0.039 N/A N/A Volume (cm) : Full Thickness Without Exposed N/A N/A Classification: Support Structures Medium N/A N/A Exudate Amount: Serosanguineous N/A N/A Exudate Type: red, brown N/A N/A Exudate Color: Medium (34-66%) N/A N/A Granulation Amount: Red, Pink N/A N/A Granulation Quality: None Present (0%) N/A N/A Necrotic Amount: Fascia: No N/A N/A Exposed Structures: Fat Layer (Subcutaneous Tissue): No Tendon: No Muscle: No Joint: No Bone: No Medium (34-66%) N/A N/A Epithelialization: Treatment Notes Christian [name], Christian [name] ([id number]) [id number] Page 5 of 7 Electronic Signature(s) Signed: 11/25/2022 4:32:14 PM By: Rosalio Loud MSN RN CNS WTA Entered By: Rosalio Loud on 11/25/2022 15:32:26 -------------------------------------------------------------------------------- Multi-Disciplinary Care Plan Details Patient Name:  Date of Service: Christian [name], Christian [name] Christian [name]. 11/25/2022 2:00 PM Medical Record Number: [id number] Patient Account Number: [id number] Date of Birth/Sex: Treating RN: [name]/[birth year]/Christian [name] (82 y.o. Christian [name] Primary Care Jaquavis Felmlee: Reginia Forts Other Clinician: Referring Hulan Szumski: Treating Terrian Ridlon/Extender: Kathi Ludwig Weeks in Treatment: 0 Active Inactive Orientation to the [facility] Nursing Diagnoses: Knowledge deficit related to the [facility] Goals: Patient/caregiver will verbalize understanding of the [place] Program Date Initiated: 11/25/2022 Target Resolution Date: 11/25/2022 Goal Status: Active Interventions: Provide education on orientation to the [facility] Notes: Christian/Skin Impairment Nursing Diagnoses: Impaired tissue integrity Knowledge deficit related to ulceration/compromised skin integrity Goals: Patient/caregiver will verbalize understanding of skin care regimen Date Initiated: 11/25/2022 Target Resolution Date: 12/25/2022 Goal Status: Active Ulcer/skin breakdown will have a volume reduction of 30% by week 4 Date Initiated: 11/25/2022 Target Resolution Date: 12/25/2022 Goal Status: Active Ulcer/skin breakdown will have a volume reduction of 50% by week 8 Date Initiated: 11/25/2022 Target Resolution Date: 01/25/2023 Goal Status: Active Ulcer/skin breakdown will have a volume reduction of 80% by week 12 Date Initiated: 11/25/2022 Target Resolution Date: 02/24/2023 Goal Status: Active Ulcer/skin breakdown will heal within 14 weeks Date Initiated: 11/25/2022 Target Resolution Date: 03/10/2023 Goal Status: Active Interventions: Assess patient/caregiver ability to obtain necessary supplies Assess patient/caregiver ability to perform ulcer/skin care regimen upon admission and as needed Assess ulceration(s) every visit Treatment Activities: Skin care regimen initiated : 11/25/2022 Notes: Electronic Signature(s) Signed: 11/25/2022 4:32:14 PM  By: Rosalio Loud MSN RN CNS WTA Entered By: Rosalio Loud on 11/25/2022 15:55:35 Christian [name], Christian [name] ([id number] Page 6 of 7 -------------------------------------------------------------------------------- Pain Assessment Details Patient Name: Date of Service: Christian [name] Christian [name]. 11/25/2022 2:00 PM Medical Record Number: [id number] Patient Account Number: [id number] Date of Birth/Sex: Treating RN: Christian [name][birth year] (82 y.o. Christian [name] Primary Care Shaundra Fullam: Reginia Forts Other Clinician: Referring Afreen Siebels: Treating Zyion Leidner/Extender: Kathi Ludwig Weeks in Treatment: 0 Active Problems Location of Pain Severity and Description of Pain Patient Has Paino No Site Locations Pain Management and Medication Current Pain Management: Electronic Signature(s) Signed: 11/25/2022 4:32:14 PM By: Rosalio Loud MSN RN CNS WTA Entered By: Rosalio Loud on 11/25/2022 14:34:18 -------------------------------------------------------------------------------- Patient/Caregiver Education Details Patient Name: Date of Service: Christian [name] Christian [name]. 4/1/2024andnbsp2:00 PM Medical Record Number: [id number] Patient Account Number: [id number] Date of Birth/Gender: Treating RN: Christian [name][birth year] (82 y.o. Christian [name] Primary Care Physician: Reginia Forts Other Clinician: Referring Physician: Treating Physician/Extender: Kathi Ludwig Weeks in Treatment: 0 Education Assessment Education Provided To: Patient  and Caregiver Education Topics Provided Welcome T [name]: o Handouts: Welcome T [facility] [place] o Methods: Explain/Verbal Responses: State content correctly Christian/Skin Impairment: Handouts: Caring for Your Ulcer Methods: Explain/Verbal Responses: State content correctly Electronic Signature(s) Signed: 11/25/2022 4:32:14 PM By: Rosalio Loud MSN RN CNS [address], [place] [name] ([id number])  [id number] Page 7 of 7 Entered By: Rosalio Loud on 11/25/2022 15:55:43 -------------------------------------------------------------------------------- Vitals Details Patient Name: Date of Service: Christian [name] Christian [name]. 11/25/2022 2:00 PM Medical Record Number: [id number] Patient Account Number: [id number] Date of Birth/Sex: Treating RN: [birth year]Christian [name] (82 y.o. Christian [name] Primary Care Earle Troiano: Reginia Forts Other Clinician: Referring Rasheen Bells: Treating Richerd Grime/Extender: Kathi Ludwig Weeks in Treatment: 0 Vital Signs Time Taken: 14:36 Temperature (F): 97.7 Height (in): 69 Pulse (bpm): 53 Source: Stated Respiratory Rate (breaths/min): 18 Weight (lbs): 246 Blood Pressure (mmHg): 103/89 Body Mass Index (BMI): 36.3 Reference Range: 80 - 120 mg / dl Electronic Signature(s) Signed: 11/25/2022 4:32:14 PM By: Rosalio Loud MSN RN CNS WTA Entered By: Rosalio Loud on 11/25/2022 14:36:59

## 2022-11-29 NOTE — Progress Notes (Addendum)
[place], Christian [name] ([id number]) [id number] Page 1 of 7 Visit Report for 11/25/2022 Chief Complaint Document Details Patient Name: Date of Service: Christian [name]. 11/25/2022 2:00 PM Medical Record Number: [id number] Patient Account Number: [id number] Date of Birth/Sex: Treating RN: July 10, 1941 (82 y.o. Christian [name] Primary Care Provider: Nilda Simmer Other Clinician: Referring Provider: Treating Provider/Extender: Reina Fuse Weeks in Treatment: 0 Information Obtained from: Patient Chief Complaint Left LE Ulcer Electronic Signature(s) Signed: 11/25/2022 3:21:27 PM By: Lenda Kelp PA-C Entered By: Lenda Kelp on 11/25/2022 15:21:27 -------------------------------------------------------------------------------- HPI Details Patient Name: Date of Service: Christian [name], Christian [name]. 11/25/2022 2:00 PM Medical Record Number: [id number] Patient Account Number: [id number] Date of Birth/Sex: Treating RN: Nov 17, 1940 (82 y.o. Christian [name] Primary Care Provider: Nilda Simmer Other Clinician: Referring Provider: Treating Provider/Extender: Reina Fuse Weeks in Treatment: 0 History of Present Illness HPI Description: 11-25-2022 upon evaluation today patient appears to be doing somewhat poorly currently in regard to his legs where he does appear to have a bilateral cellulitis more than anything else. I do not see anything that actually appears to be significantly weeping and also do not see anything that has any open wounds noted at this point. Fortunately I think that we can probably get this under control but we definitely are going to have to initiate a few things to ensure that he is moving in the right direction. The patient voiced understanding. With that being said I think an oral antibiotic is going to be necessitated in this case. Patient does have a history of hypertension, coronary artery disease, anticoagulant therapy due to a  personal history of DVT diabetes mellitus type 2, and , chronic venous hypertension. Electronic Signature(s) Signed: 12/06/2022 7:51:42 AM By: Allen Derry PA-C Entered By: Allen Derry on 12/06/2022 07:51:42 -------------------------------------------------------------------------------- Physical Exam Details Patient Name: Date of Service: Christian [name]. 11/25/2022 2:00 PM Medical Record Number: [id number] Patient Account Number: [id number] Date of Birth/Sex: Treating RN: 02-10-1941 (82 y.o. Christian [name] Primary Care Provider: Nilda Simmer Other Clinician: Referring Provider: Treating Provider/Extender: Reina Fuse Weeks in Treatment: 0 Constitutional sitting or standing blood pressure is within target range for patient.. pulse regular and within target range for patient.Marland Kitchen respirations regular, non-labored and within target range for patient.Marland Kitchen temperature within target range for patient.. Well-nourished and well-hydrated in no acute distress. Eyes conjunctiva clear no eyelid edema noted. pupils equal round and reactive to light and accommodation. Ears, Nose, Mouth, and Throat no gross abnormality of ear auricles or external auditory canals. normal hearing noted during conversation. mucus membranes moist. CANDLER, GINSBERG [name] ([id number]) [id number] Page 2 of 7 Respiratory normal breathing without difficulty. Cardiovascular 1+ dorsalis pedis/posterior tibialis pulses. no clubbing, cyanosis, significant edema, <3 sec cap refill. Musculoskeletal normal gait and posture. no significant deformity or arthritic changes, no loss or range of motion, no clubbing. Psychiatric this patient is able to make decisions and demonstrates good insight into disease process. Alert and Oriented x 3. pleasant and cooperative. Notes Upon inspection patient's wounds again are really nonexistent he does have bilateral lower extremity cellulitis I do not believe this is  just a lymphedema issue but I think does encompass more of a cellulitic issue. I discussed that with the patient today I do think he is going require oral antibiotics which I think would greatly benefit him. He voiced understanding. He does have some swelling of his legs again I am unsure if this is really just  secondary to the cellulitis or something he can need long-term my guess is he probably does need compression stockings long-term but we need to get this infection under control. Electronic Signature(s) Signed: 12/06/2022 7:52:35 AM By: Allen Derry PA-C Previous Signature: 12/06/2022 7:52:19 AM Version By: Allen Derry PA-C Entered By: Allen Derry on 12/06/2022 07:52:35 -------------------------------------------------------------------------------- Physician Orders Details Patient Name: Date of Service: Christian [name], Christian [name]. 11/25/2022 2:00 PM Medical Record Number: [id number] Patient Account Number: [id number] Date of Birth/Sex: Treating RN: [birth year] (82 y.o. Christian [name] Primary Care Provider: Nilda Simmer Other Clinician: Referring Provider: Treating Provider/Extender: Reina Fuse Weeks in Treatment: 0 Verbal / Phone Orders: No Diagnosis Coding ICD-10 Coding Code Description [id number] Chronic venous hypertension (idiopathic) with ulcer and inflammation of left lower extremity L97.822 Non-pressure chronic ulcer of other part of left lower leg with fat layer exposed E11.622 Type 2 diabetes mellitus with other skin ulcer Z86.718 Personal history of other venous thrombosis and embolism Z79.01 Long term (current) use of anticoagulants I25.10 Atherosclerotic heart disease of native coronary artery without angina pectoris I10 Essential (primary) hypertension Follow-up Appointments Return Appointment in 2 weeks. Edema Control - Lymphedema / Segmental Compressive Device / Other Tubigrip single layer applied. - D Bilateral Non-Wound Condition Cleanse affected area with  antibacterial soap and water, - Apply Bacitracin to the LL Leg Twice daily pply appropriate compression. - Tubi Grip D A Patient Medications llergies: sulfasalazine, ceftriaxone A Notifications Medication Indication Start End 11/25/2022 doxycycline hyclate DOSE 1 - oral 100 mg capsule - 1 capsule oral twice a day x 14 days Electronic Signature(s) Signed: 11/25/2022 4:00:00 PM By: Lenda Kelp PA-C Entered By: Lenda Kelp on 11/25/2022 15:59:59 Hollis, [place] [name] ([id number]) [id number] Page 3 of 7 -------------------------------------------------------------------------------- Problem [name] Details Patient Name: Date of Service: Christian [name]. 11/25/2022 2:00 PM Medical Record Number: [id number] Patient Account Number: [id number] Date of Birth/Sex: Treating RN: [birth year] (82 y.o. Christian [name] Primary Care Provider: Nilda Simmer Other Clinician: Referring Provider: Treating Provider/Extender: Reina Fuse Weeks in Treatment: 0 Active Problems ICD-10 Encounter Code Description Active Date MDM Diagnosis I87.332 Chronic venous hypertension (idiopathic) with ulcer and inflammation of left 11/25/2022 No Yes lower extremity L97.822 Non-pressure chronic ulcer of other part of left lower leg with fat layer exposed4/08/2022 No Yes E11.622 Type 2 diabetes mellitus with other skin ulcer 11/25/2022 No Yes Z86.718 Personal history of other venous thrombosis and embolism 11/25/2022 No Yes Z79.01 Long term (current) use of anticoagulants 11/25/2022 No Yes I25.10 Atherosclerotic heart disease of native coronary artery without angina pectoris 11/25/2022 No Yes I10 Essential (primary) hypertension 11/25/2022 No Yes Inactive Problems Resolved Problems Electronic Signature(s) Signed: 11/25/2022 4:32:14 PM By: Midge Aver MSN RN CNS WTA Signed: 11/29/2022 8:47:01 AM By: Lenda Kelp PA-C Previous Signature: 11/25/2022 3:21:15 PM Version By: Lenda Kelp  PA-C Previous Signature: 11/25/2022 3:20:55 PM Version By: Lenda Kelp PA-C Entered By: Midge Aver on 11/25/2022 15:28:50 -------------------------------------------------------------------------------- Progress Note Details Patient Name: Date of Service: Christian [name], JA [name]. 11/25/2022 2:00 PM Medical Record Number: [id number] Patient Account Number: [id number] Date of Birth/Sex: Treating RN: April 23, 1941 (82 y.o. Christian [name] Primary Care Provider: Nilda Simmer Other Clinician: Referring Provider: Treating Provider/Extender: Reina Fuse Weeks in Treatment: 0 Subjective Chief Complaint Information obtained from Patient Left LE Ulcer DUSHAWN, PUSEY [name] ([id number]) [id number] Page 4 of 7 History of Present Illness (HPI) 11-25-2022 upon evaluation today patient appears to be doing somewhat poorly  currently in regard to his legs where he does appear to have a bilateral cellulitis more than anything else. I do not see anything that actually appears to be significantly weeping and also do not see anything that has any open wounds noted at this point. Fortunately I think that we can probably get this under control but we definitely are going to have to initiate a few things to ensure that he is moving in the right direction. The patient voiced understanding. With that being said I think an oral antibiotic is going to be necessitated in this case. Patient does have a history of hypertension, coronary artery disease, anticoagulant therapy due to a personal history of DVT diabetes mellitus type 2, and , chronic venous hypertension. Patient History Information obtained from Patient. Allergies sulfasalazine (Severity: Moderate), ceftriaxone Social History Never smoker, Marital Status - Married, Alcohol Use - Never, Drug Use - No History, Caffeine Use - Rarely. Medical History Cardiovascular Patient has history of Coronary Artery Disease, Hypertension,  Myocardial Infarction Endocrine Patient has history of Type II Diabetes Denies history of Type I Diabetes Neurologic Patient has history of Neuropathy Oncologic Patient has history of Received Chemotherapy Patient is treated with Insulin, Oral Agents. Blood sugar is tested. Blood sugar results noted at the following times: Lunch - 346. Medical A Surgical History Notes nd Oncologic Bladder cancer Review of Systems (ROS) Constitutional Symptoms (General Health) Denies complaints or symptoms of Fatigue, Fever, Chills, Marked Weight Change. Eyes Complains or has symptoms of Glasses / Contacts. Ear/Nose/Mouth/Throat Denies complaints or symptoms of Difficult clearing ears, Sinusitis. Hematologic/Lymphatic Denies complaints or symptoms of Bleeding / Clotting Disorders, Human Immunodeficiency Virus. Respiratory Denies complaints or symptoms of Chronic or frequent coughs, Shortness of Breath. Gastrointestinal Denies complaints or symptoms of Frequent diarrhea, Nausea, Vomiting. Genitourinary Has a Urostomy Immunological Denies complaints or symptoms of Hives, Itching. Integumentary (Skin) Complains or has symptoms of Breakdown, Swelling. Musculoskeletal Denies complaints or symptoms of Muscle Pain, Muscle Weakness. Objective Constitutional sitting or standing blood pressure is within target range for patient.. pulse regular and within target range for patient.Marland Kitchen respirations regular, non-labored and within target range for patient.Marland Kitchen temperature within target range for patient.. Well-nourished and well-hydrated in no acute distress. Vitals Time Taken: 2:36 PM, Height: 69 in, Source: Stated, Weight: 246 lbs, BMI: 36.3, Temperature: 97.7 F, Pulse: 53 bpm, Respiratory Rate: 18 breaths/min, Blood Pressure: 103/89 mmHg. Eyes conjunctiva clear no eyelid edema noted. pupils equal round and reactive to light and accommodation. Ears, Nose, Mouth, and Throat no gross abnormality of ear  auricles or external auditory canals. normal hearing noted during conversation. mucus membranes moist. Respiratory normal breathing without difficulty. Cardiovascular 1+ dorsalis pedis/posterior tibialis pulses. no clubbing, cyanosis, significant edema, Musculoskeletal normal gait and posture. no significant deformity or arthritic changes, no loss or range of motion, no clubbing. ONTARIO, PETTENGILL [name] ([id number]) [id number] Page 5 of 7 Psychiatric this patient is able to make decisions and demonstrates good insight into disease process. Alert and Oriented x 3. pleasant and cooperative. General Notes: Upon inspection patient's wounds again are really nonexistent he does have bilateral lower extremity cellulitis I do not believe this is just a lymphedema issue but I think does encompass more of a cellulitic issue. I discussed that with the patient today I do think he is going require oral antibiotics which I think would greatly benefit him. He voiced understanding. He does have some swelling of his legs again I am unsure if this is really just secondary to the cellulitis  or something he can need long-term my guess is he probably does need compression stockings long-term but we need to get this infection under control. Assessment Active Problems ICD-10 Chronic venous hypertension (idiopathic) with ulcer and inflammation of left lower extremity Non-pressure chronic ulcer of other part of left lower leg with fat layer exposed Type 2 diabetes mellitus with other skin ulcer Personal history of other venous thrombosis and embolism Long term (current) use of anticoagulants Atherosclerotic heart disease of native coronary artery without angina pectoris Essential (primary) hypertension Plan Follow-up Appointments: Return Appointment in 2 weeks. Edema Control - Lymphedema / Segmental Compressive Device / Other: Tubigrip single layer applied. - D Bilateral Non-Wound  Condition: Cleanse affected area with antibacterial soap and water, - Apply Bacitracin to the LL Leg Twice daily Apply appropriate compression. - Tubi Grip D The following medication(s) was prescribed: doxycycline hyclate oral 100 mg capsule 1 1 capsule oral twice a day x 14 days starting 11/25/2022 1. I would recommend currently based on what we are seeing that we initiate a few things to help out here. First and foremost I am to place the patient on doxycycline which I think would be an appropriate antibiotic for him currently in order to treat the infection. It is a very good skin antibiotic and with the lack of any type of culture possibility I think this is an excellent option here. 2. I would recommend that we use bilateral Tubigrip size D to help with edema control. 3. Will also have him use the bacitracin that he has to the legs twice a day in order to help topically although I think the doxycycline is going to benefit him mostly. We will see patient back for reevaluation in 1 week here in the clinic. If anything worsens or changes patient will contact our office for additional recommendations. Electronic Signature(s) Signed: 12/06/2022 7:53:19 AM By: Allen DerryStone, Ayda Tancredi PA-C Entered By: Allen DerryStone, Alura Olveda on 12/06/2022 07:53:19 -------------------------------------------------------------------------------- ROS/PFSH Details Patient Name: Date of Service: Christian ListMCCA LL, JA [name]. 11/25/2022 2:00 PM Medical Record Number: [id number] Patient Account Number: [id number] Date of Birth/Sex: Treating RN: 1940/10/08 (82 y.o. Christian CluckM) Smith, Vicki Primary Care Provider: Nilda SimmerSMITH, KRISTI Other Clinician: Referring Provider: Treating Provider/Extender: Reina FuseStone, Field Staniszewski Ton, Christie Weeks in Treatment: 0 Information Obtained From Patient Constitutional Symptoms (General Health) Complaints and Symptoms: Negative for: Fatigue; Fever; Chills; Marked Weight Change Eyes Teressa LowerMCCALL, Willmer [name] ([id number])  [id number] Page 6 of 7 Complaints and Symptoms: Positive for: Glasses / Contacts Ear/Nose/Mouth/Throat Complaints and Symptoms: Negative for: Difficult clearing ears; Sinusitis Hematologic/Lymphatic Complaints and Symptoms: Negative for: Bleeding / Clotting Disorders; Human Immunodeficiency Virus Respiratory Complaints and Symptoms: Negative for: Chronic or frequent coughs; Shortness of Breath Gastrointestinal Complaints and Symptoms: Negative for: Frequent diarrhea; Nausea; Vomiting Immunological Complaints and Symptoms: Negative for: Hives; Itching Integumentary (Skin) Complaints and Symptoms: Positive for: Breakdown; Swelling Musculoskeletal Complaints and Symptoms: Negative for: Muscle Pain; Muscle Weakness Cardiovascular Medical History: Positive for: Coronary Artery Disease; Hypertension; Myocardial Infarction Endocrine Medical History: Positive for: Type II Diabetes Negative for: Type I Diabetes Time with diabetes: 6 weeks ago Treated with: Insulin, Oral agents Blood sugar tested every day: Yes Tested : 4 times a day Blood sugar testing results: Lunch: 346 Genitourinary Complaints and Symptoms: Review of System Notes: Has a Urostomy Neurologic Medical History: Positive for: Neuropathy Oncologic Medical History: Positive for: Received Chemotherapy Past Medical History Notes: Bladder cancer Immunizations Pneumococcal Vaccine: Received Pneumococcal Vaccination: Yes Received Pneumococcal Vaccination On or After [address]62[address] [place]: Yes Spare, Jasiah [name] ([id number])  [id number] Page 7 of 7 Implantable Devices None Family and Social History Never smoker; Marital Status - Married; Alcohol Use: Never; Drug Use: No History; Caffeine Use: Rarely Electronic Signature(s) Signed: 11/25/2022 4:32:14 PM By: Midge AverSmith, Vicki MSN RN CNS WTA Signed: 11/29/2022 8:47:01 AM By: Lenda KelpStone III, Jakaila Norment PA-C Entered By: Midge AverSmith, Vicki on  11/25/2022 15:31:52 -------------------------------------------------------------------------------- SuperBill Details Patient Name: Date of Service: Christian ListMCCA LL, JA [name]. 11/25/2022 Medical Record Number: [id number] Patient Account Number: [id number] Date of Birth/Sex: Treating RN: [birth year] (82 y.o. Christian CluckM) Smith, Vicki Primary Care Provider: Nilda SimmerSMITH, KRISTI Other Clinician: Referring Provider: Treating Provider/Extender: Reina FuseStone, Zionna Homewood Ton, Christie Weeks in Treatment: 0 Diagnosis Coding ICD-10 Codes Code Description [phone] Chronic venous hypertension (idiopathic) with ulcer and inflammation of left lower extremity L97.822 Non-pressure chronic ulcer of other part of left lower leg with fat layer exposed E11.622 Type 2 diabetes mellitus with other skin ulcer Z86.718 Personal history of other venous thrombosis and embolism Z79.01 Long term (current) use of anticoagulants I25.10 Atherosclerotic heart disease of native coronary artery without angina pectoris I10 Essential (primary) hypertension Facility Procedures : CPT4 Code: [id number] Description: [id number] - WOUND CARE VISIT-LEV 4 EST PT Modifier: Quantity: 1 Electronic Signature(s) Signed: 11/25/2022 4:32:14 PM By: Midge AverSmith, Vicki MSN RN CNS WTA Signed: 11/29/2022 8:47:01 AM By: Lenda KelpStone III, Kalijah Zeiss PA-C Entered By: Midge AverSmith, Vicki on 11/25/2022 15:55:03

## 2022-12-04 NOTE — Progress Notes (Signed)

## 2022-12-17 ENCOUNTER — Encounter: Payer: Medicare HMO | Admitting: Physician Assistant

## 2022-12-17 DIAGNOSIS — E1165 Type 2 diabetes mellitus with hyperglycemia: Secondary | ICD-10-CM | POA: Diagnosis not present

## 2022-12-18 NOTE — Progress Notes (Addendum)
Christian [name] [name] ([id number]) [id number] Page 1 of 5 Visit Report for 12/17/2022 Chief Complaint Document Details Patient Name: Date of Service: Christian [name] Christian [name]. 12/17/2022 3:30 PM Medical Record Number: [id number] Patient Account Number: [id number] Date of Birth/Sex: Treating RN: November 12, 1940 (82 y.o. Christian [name] Primary Care Provider: Nilda Simmer Other Clinician: Referring Provider: Treating Provider/Extender: Burnis Kingfisher Weeks in Treatment: 3 Information Obtained from: Patient Chief Complaint Left LE Ulcer Electronic Signature(s) Signed: 12/17/2022 3:36:29 PM By: Allen Derry PA-C Entered By: Allen Derry on 12/17/2022 15:36:29 -------------------------------------------------------------------------------- HPI Details Patient Name: Date of Service: Christian [name], Christian [name] Christian [name]. 12/17/2022 3:30 PM Medical Record Number: [id number] Patient Account Number: [id number] Date of Birth/Sex: Treating RN: February 10, 1941 (82 y.o. Christian [name] Primary Care Provider: Nilda Simmer Other Clinician: Referring Provider: Treating Provider/Extender: Elinor Parkinson, KRISTI Weeks in Treatment: 3 History of Present Illness HPI Description: 11-25-2022 upon evaluation today patient appears to be doing somewhat poorly currently in regard to his legs where he does appear to have a bilateral cellulitis more than anything else. I do not see anything that actually appears to be significantly weeping and also do not see anything that [name] any open wounds noted at this point. Fortunately I think that we can probably get this under control but we definitely are going to have to initiate a few things to ensure that he is moving in the right direction. The patient voiced understanding. With that being said I think an oral antibiotic is going to be necessitated in this case. Patient does have a history of hypertension, coronary artery disease, anticoagulant therapy due to a  personal history of DVT diabetes mellitus type 2, and , chronic venous hypertension. 12-17-2022 upon evaluation patient appears to be doing well currently in regard to his legs. He [name] been tolerating the dressing changes without complication and in general I think that we are really making good progress here. I do not see any evidence of active infection locally nor systemically which is great news. No fevers, chills, nausea, vomiting, or diarrhea. Electronic Signature(s) Signed: 12/17/2022 4:55:19 PM By: Allen Derry PA-C Entered By: Allen Derry on 12/17/2022 16:55:19 -------------------------------------------------------------------------------- Physical Exam Details Patient Name: Date of Service: Christian [name] Christian [name]. 12/17/2022 3:30 PM Medical Record Number: [id number] Patient Account Number: [id number] Date of Birth/Sex: Treating RN: 1940-10-27 (82 y.o. Christian [name] Primary Care Provider: Nilda Simmer Other Clinician: Referring Provider: Treating Provider/Extender: Elinor Parkinson, KRISTI Weeks in Treatment: 3 Constitutional Well-nourished and well-hydrated in no acute distress. Respiratory normal breathing without difficulty. Christian [name], Christian [name] ([id number]) [id number] Page 2 of 5 Psychiatric this patient is able to make decisions and demonstrates good insight into disease process. Alert and Oriented x 3. pleasant and cooperative. Notes Upon inspection patient [name] a lot of dry skin but really nothing that appears to be open at this point. He [name] no wounds. I do believe that we should likely continue to monitor for any signs of infection or worsening in general however I think that he does not have anything open that is going require ongoing wound care. Electronic Signature(s) Signed: 12/17/2022 4:56:04 PM By: Allen Derry PA-C Entered By: Allen Derry on 12/17/2022  16:56:03 -------------------------------------------------------------------------------- Physician Orders Details Patient Name: Date of Service: Christian [name] Christian [name]. 12/17/2022 3:30 PM Medical Record Number: [id number] Patient Account Number: [id number] Date of Birth/Sex: Treating RN: 04-29-1941 (82 y.o. Christian [name] Primary Care Provider: Nilda Simmer Other Clinician: Referring Provider: Treating Provider/Extender: Elinor Parkinson,  KRISTI Weeks in Treatment: 3 Verbal / Phone Orders: No Diagnosis Coding ICD-10 Coding Code Description I87.332 Chronic venous hypertension (idiopathic) with ulcer and inflammation of left lower extremity L97.822 Non-pressure chronic ulcer of other part of left lower leg with fat layer exposed E11.622 Type 2 diabetes mellitus with other skin ulcer Z86.718 Personal history of other venous thrombosis and embolism Z79.01 Long term (current) use of anticoagulants I25.10 Atherosclerotic heart disease of native coronary artery without angina pectoris I10 Essential (primary) hypertension Discharge From [facility] Services Discharge from Wound Care Center Treatment Complete - Please call and order compression stockings from elastic therapy and wear daily. Please wear socks daily. Please check your feet daily for any open areas. Please call with any issues or if any wounds open up. Wear compression garments daily. Put garments on first thing when you wake up and remove them before bed. Moisturize legs daily after removing compression garments. Elevate, Exercise Daily and A void Standing for Long Periods of Time. DO YOUR BEST to sleep in the bed at night. DO NOT sleep in your recliner. Long hours of sitting in a recliner leads to swelling of the legs and/or potential wounds on your backside. Electronic Signature(s) Signed: 12/17/2022 4:18:36 PM By: Angelina Pih Signed: 12/17/2022 4:59:06 PM By: Allen Derry PA-C Entered By: Angelina Pih on 12/17/2022  16:18:36 -------------------------------------------------------------------------------- Problem [name] Details Patient Name: Date of Service: Christian [name], Christian [name] Christian [name]. 12/17/2022 3:30 PM Medical Record Number: [id number] Patient Account Number: [id number] Date of Birth/Sex: Treating RN: 1941-02-23 (82 y.o. Christian [name] Primary Care Provider: Nilda Simmer Other Clinician: Referring Provider: Treating Provider/Extender: Burnis Kingfisher Weeks in Treatment: 3 Active Problems ICD-10 Encounter Code Description Active Date MDM Diagnosis I87.332 Chronic venous hypertension (idiopathic) with ulcer and inflammation of left 11/25/2022 No Yes lower extremity YU, PEGGS ([id number]) [id number] Page 3 of 5 [phone] Non-pressure chronic ulcer of other part of left lower leg with fat layer exposed4/08/2022 No Yes E11.622 Type 2 diabetes mellitus with other skin ulcer 11/25/2022 No Yes Z86.718 Personal history of other venous thrombosis and embolism 11/25/2022 No Yes Z79.01 Long term (current) use of anticoagulants 11/25/2022 No Yes I25.10 Atherosclerotic heart disease of native coronary artery without angina pectoris 11/25/2022 No Yes I10 Essential (primary) hypertension 11/25/2022 No Yes Inactive Problems Resolved Problems Electronic Signature(s) Signed: 12/17/2022 3:36:26 PM By: Allen Derry PA-C Entered By: Allen Derry on 12/17/2022 15:36:26 -------------------------------------------------------------------------------- Progress Note Details Patient Name: Date of Service: Christian [name] Christian [name]. 12/17/2022 3:30 PM Medical Record Number: [id number] Patient Account Number: [id number] Date of Birth/Sex: Treating RN: 03-Mar-1941 (82 y.o. Christian [name] Primary Care Provider: Nilda Simmer Other Clinician: Referring Provider: Treating Provider/Extender: Burnis Kingfisher Weeks in Treatment: 3 Subjective Chief Complaint Information obtained from Patient Left LE  Ulcer History of Present Illness (HPI) 11-25-2022 upon evaluation today patient appears to be doing somewhat poorly currently in regard to his legs where he does appear to have a bilateral cellulitis more than anything else. I do not see anything that actually appears to be significantly weeping and also do not see anything that [name] any open wounds noted at this point. Fortunately I think that we can probably get this under control but we definitely are going to have to initiate a few things to ensure that he is moving in the right direction. The patient voiced understanding. With that being said I think an oral antibiotic is going to be necessitated in this case. Patient does have a history of  hypertension, coronary artery disease, anticoagulant therapy due to a personal history of DVT diabetes mellitus type 2, and , chronic venous hypertension. 12-17-2022 upon evaluation patient appears to be doing well currently in regard to his legs. He [name] been tolerating the dressing changes without complication and in general I think that we are really making good progress here. I do not see any evidence of active infection locally nor systemically which is great news. No fevers, chills, nausea, vomiting, or diarrhea. Objective Constitutional Well-nourished and well-hydrated in no acute distress. Christian [name], Christian [name] ([id number]) [id number] Page 4 of 5 Vitals Time Taken: 3:39 PM, Height: 69 in, Weight: 246 lbs, BMI: 36.3, Temperature: 98 F, Pulse: 62 bpm, Respiratory Rate: 18 breaths/min, Blood Pressure: 116/78 mmHg. Respiratory normal breathing without difficulty. Psychiatric this patient is able to make decisions and demonstrates good insight into disease process. Alert and Oriented x 3. pleasant and cooperative. General Notes: Upon inspection patient [name] a lot of dry skin but really nothing that appears to be open at this point. He [name] no wounds. I do believe that we should  likely continue to monitor for any signs of infection or worsening in general however I think that he does not have anything open that is going require ongoing wound care. Other Condition(s) Patient presents with Cellulitis located on the Bilateral Leg. General Notes: new spot opened to left foot, new spot opened on upper right leg Assessment Active Problems ICD-10 Chronic venous hypertension (idiopathic) with ulcer and inflammation of left lower extremity Non-pressure chronic ulcer of other part of left lower leg with fat layer exposed Type 2 diabetes mellitus with other skin ulcer Personal history of other venous thrombosis and embolism Long term (current) use of anticoagulants Atherosclerotic heart disease of native coronary artery without angina pectoris Essential (primary) hypertension Plan Discharge From [facility] Services: Discharge from Wound Care Center Treatment Complete - Please call and order compression stockings from elastic therapy and wear daily. Please wear socks daily. Please check your feet daily for any open areas. Please call with any issues or if any wounds open up. Wear compression garments daily. Put garments on first thing when you wake up and remove them before bed. Moisturize legs daily after removing compression garments. Elevate, Exercise Daily and Avoid Standing for Long Periods of Time. DO YOUR BEST to sleep in the bed at night. DO NOT sleep in your recliner. Long hours of sitting in a recliner leads to swelling of the legs and/or potential wounds on your backside. 1. I would recommend currently that we have the patient continue to monitor for any signs of infection or worsening. Based on what I am seeing I think that he is doing well I do not see any infection I think he is probably ready for discharge. 2. I am good recommend as well that he continue to elevate his legs much as possible he should also be using compression socks we did give him the measurements for  getting some compression socks elastic therapy he is in agreement with this plan. We will see him back for follow-up visit just as needed at this point. Electronic Signature(s) Signed: 12/17/2022 4:57:03 PM By: Allen Derry PA-C Entered By: Allen Derry on 12/17/2022 16:57:03 -------------------------------------------------------------------------------- SuperBill Details Patient Name: Date of Service: Christian [name], JA Christian [name]. 12/17/2022 Medical Record Number: [id number] Patient Account Number: [id number] Date of Birth/Sex: Treating RN: [birth year] (82 y.o. Christian [name] Primary Care Provider: Nilda Simmer Other Clinician: Referring Provider: Treating Provider/Extender: Elinor Parkinson, KRISTI  Weeks in Treatment: 3 Diagnosis Coding ICD-10 Codes Code Description Christian [name], Christian [name] ([id number]) [id number] Page 5 of 5 [phone] Chronic venous hypertension (idiopathic) with ulcer and inflammation of left lower extremity L97.822 Non-pressure chronic ulcer of other part of left lower leg with fat layer exposed E11.622 Type 2 diabetes mellitus with other skin ulcer Z86.718 Personal history of other venous thrombosis and embolism Z79.01 Long term (current) use of anticoagulants I25.10 Atherosclerotic heart disease of native coronary artery without angina pectoris I10 Essential (primary) hypertension Facility Procedures : CPT4 Code: [id number] Description: 62130 - WOUND CARE VISIT-LEV 2 EST PT Modifier: Quantity: 1 Physician Procedures : CPT4 Code Description Modifier [id number] - WC PHYS LEVEL 3 - EST PT ICD-10 Diagnosis Description I87.332 Chronic venous hypertension (idiopathic) with ulcer and inflammation of left lower extremity L97.822 Non-pressure chronic ulcer of other part  of left lower leg with fat layer exposed E11.622 Type 2 diabetes mellitus with other skin ulcer Z86.718 Personal history of other venous thrombosis and embolism Quantity: 1 Electronic  Signature(s) Signed: 12/17/2022 4:57:18 PM By: Allen Derry PA-C Previous Signature: 12/17/2022 4:19:10 PM Version By: Angelina Pih Entered By: Allen Derry on 12/17/2022 16:57:17

## 2022-12-18 NOTE — Progress Notes (Addendum)
HYUN, MARSALIS R ([id number]) [id number] Page 1 of 6 Visit Report for 12/17/2022 Arrival Information Details Patient Name: Date of Service: Christian Has MES R. 12/17/2022 3:30 PM Medical Record Number: [id number] Patient Account Number: [id number] Date of Birth/Sex: Treating RN: 24-Oct-1940 (82 y.o. Laymond Purser Primary Care Kyerra Vargo: Nilda Simmer Other Clinician: Referring Shayleigh Bouldin: Treating Trenell Concannon/Extender: Elinor Parkinson, KRISTI Weeks in Treatment: 3 Visit Information History Since Last Visit Added or deleted any medications: No Patient Arrived: Ambulatory Any new allergies or adverse reactions: No Arrival Time: 15:37 Had a fall or experienced change in No Accompanied By: spouse activities of daily living that may affect Transfer Assistance: None risk of falls: Patient Identification Verified: Yes Hospitalized since last visit: No Secondary Verification Process Completed: Yes Pain Present Now: No Patient Requires Transmission-Based Precautions: No Patient Has Alerts: Yes Patient Alerts: Patient on Blood Thinner Diabetes type II Eliquis Electronic Signature(s) Signed: 12/17/2022 4:43:31 PM By: Angelina Pih Entered By: Angelina Pih on 12/17/2022 15:39:45 -------------------------------------------------------------------------------- Clinic Level of Care Assessment Details Patient Name: Date of Service: Christian Has MES R. 12/17/2022 3:30 PM Medical Record Number: [id number] Patient Account Number: [id number] Date of Birth/Sex: Treating RN: 10-Nov-1940 (82 y.o. Laymond Purser Primary Care Yechezkel Fertig: Nilda Simmer Other Clinician: Referring Shauntay Brunelli: Treating Evone Arseneau/Extender: Elinor Parkinson, KRISTI Weeks in Treatment: 3 Clinic Level of Care Assessment Items TOOL 4 Quantity Score  - 0 Use when only an EandM is performed on FOLLOW-UP visit ASSESSMENTS - Nursing Assessment / Reassessment X- 1 10 Reassessment of Co-morbidities  (includes updates in patient status) X- 1 5 Reassessment of Adherence to Treatment Plan ASSESSMENTS - Wound and Skin A ssessment / Reassessment  - 0 Simple Wound Assessment / Reassessment - one wound  - 0 Complex Wound Assessment / Reassessment - multiple wounds X- 1 10 Dermatologic / Skin Assessment (not related to wound area) ASSESSMENTS - Focused Assessment X- 1 5 Circumferential Edema Measurements - multi extremities  - 0 Nutritional Assessment / Counseling / Intervention  - 0 Lower Extremity Assessment (monofilament, tuning fork, pulses)  - 0 Peripheral Arterial Disease Assessment (using hand held doppler) ASSESSMENTS - Ostomy and/or Continence Assessment and Care  - 0 Incontinence Assessment and Management  - 0 Ostomy Care Assessment and Management (repouching, etc.) PROCESS - Coordination of Care X - Simple Patient / Family Education for ongoing care [address] [place] R ([id number]) [id number] Page 2 of 6  - 0 Complex (extensive) Patient / Family Education for ongoing care  - 0 Staff obtains Chiropractor, Records, T Results / Process Orders est  - 0 Staff telephones HHA, Nursing Homes / Clarify orders / etc  - 0 Routine Transfer to another Facility (non-emergent condition)  - 0 Routine Hospital Admission (non-emergent condition)  - 0 New Admissions / Manufacturing engineer / Ordering NPWT Apligraf, etc. ,  - 0 Emergency Hospital Admission (emergent condition) X- 1 10 Simple Discharge Coordination  - 0 Complex (extensive) Discharge Coordination PROCESS - Special Needs  - 0 Pediatric / Minor Patient Management  - 0 Isolation Patient Management  - 0 Hearing / Language / Visual special needs  - 0 Assessment of Community assistance (transportation, D/C planning, etc.)  - 0 Additional assistance / Altered mentation  - 0 Support Surface(s) Assessment (bed, cushion, seat, etc.) INTERVENTIONS  - Wound Cleansing / Measurement  - 0 Simple Wound Cleansing - one wound  - 0 Complex Wound Cleansing - multiple wounds X- 1 5 Wound Imaging (photographs - any number of wounds)  -  0 Wound Tracing (instead of photographs)  - 0 Simple Wound Measurement - one wound  - 0 Complex Wound Measurement - multiple wounds INTERVENTIONS - Wound Dressings  - 0 Small Wound Dressing one or multiple wounds  - 0 Medium Wound Dressing one or multiple wounds  - 0 Large Wound Dressing one or multiple wounds  - 0 Application of Medications - topical  - 0 Application of Medications - injection INTERVENTIONS - Miscellaneous  - 0 External ear exam  - 0 Specimen Collection (cultures, biopsies, blood, body fluids, etc.)  - 0 Specimen(s) / Culture(s) sent or taken to Lab for analysis  - 0 Patient Transfer (multiple staff / Nurse, adult / Similar devices)  - 0 Simple Staple / Suture removal (25 or less)  - 0 Complex Staple / Suture removal (26 or more)  - 0 Hypo / Hyperglycemic Management (close monitor of Blood Glucose)  - 0 Ankle / Brachial Index (ABI) - do not check if billed separately X- 1 5 Vital Signs Has the patient been seen at the hospital within the last three years: Yes Total Score: 65 Level Of Care: New/Established - Level 2 Electronic Signature(s) Signed: 12/17/2022 4:43:31 PM By: Angelina Pih Entered By: Angelina Pih on 12/17/2022 16:19:04 [place], [place] R ([id number]) [id number] Page 3 of 6 -------------------------------------------------------------------------------- Encounter Discharge Information Details Patient Name: Date of Service: Christian Has MES R. 12/17/2022 3:30 PM Medical Record Number: [id number] Patient Account Number: [id number] Date of Birth/Sex: Treating RN: Apr 19, 1941 (82 y.o. Laymond Purser Primary Care Kayton Ripp: Nilda Simmer Other Clinician: Referring Treyvonne Tata: Treating  Brigido Mera/Extender: Burnis Kingfisher Weeks in Treatment: 3 Encounter Discharge Information Items Discharge Condition: Stable Ambulatory Status: Ambulatory Discharge Destination: Home Transportation: Private Auto Accompanied By: spouse Schedule Follow-up Appointment: No Clinical Summary of Care: Electronic Signature(s) Signed: 12/17/2022 4:20:14 PM By: Angelina Pih Entered By: Angelina Pih on 12/17/2022 16:20:14 -------------------------------------------------------------------------------- Lower Extremity Assessment Details Patient Name: Date of Service: Christian Has MES R. 12/17/2022 3:30 PM Medical Record Number: [id number] Patient Account Number: [id number] Date of Birth/Sex: Treating RN: Nov 15, 1940 (82 y.o. Laymond Purser Primary Care Maelin Kurkowski: Nilda Simmer Other Clinician: Referring Maguire Sime: Treating Lynnleigh Soden/Extender: Elinor Parkinson, KRISTI Weeks in Treatment: 3 Edema Assessment Assessed: [Left: No] [Right: No] [Left: Edema] [Right: :] Calf Left: Right: Point of Measurement: 37 cm From Medial Instep 43.8 cm 42.4 cm Ankle Left: Right: Point of Measurement: 14 cm From Medial Instep 27.5 cm 27.6 cm Vascular Assessment Pulses: Dorsalis Pedis Doppler Audible: [Left:Yes] [Right:Yes] Posterior Tibial Doppler Audible: [Left:Yes] [Right:Yes] Electronic Signature(s) Signed: 12/17/2022 4:43:31 PM By: Angelina Pih Entered By: Angelina Pih on 12/17/2022 15:46:06 -------------------------------------------------------------------------------- Multi Wound Chart Details Patient Name: Date of Service: Christian Has MES R. 12/17/2022 3:30 PM Medical Record Number: [id number] Patient Account Number: [id number] Date of Birth/Sex: Treating RN: 1941-02-23 (82 y.o. Laymond Purser Primary Care Neema Fluegge: Nilda Simmer Other Clinician: Referring Braydan Marriott: Treating Geoff Dacanay/Extender: Burnis Kingfisher [place], [place] R ([id number])  [id number].pdf Page 4 of 6 Weeks in Treatment: 3 Vital Signs Height(in): 69 Pulse(bpm): 62 Weight(lbs): 246 Blood Pressure(mmHg): 116/78 Body Mass Index(BMI): 36.3 Temperature(F): 98 Respiratory Rate(breaths/min): 18 [Treatment Notes:Wound Assessments Treatment Notes] Electronic Signature(s) Signed: 12/17/2022 4:18:26 PM By: Angelina Pih Entered By: Angelina Pih on 12/17/2022 16:18:26 -------------------------------------------------------------------------------- Multi-Disciplinary Care Plan Details Patient Name: Date of Service: Christian Has MES R. 12/17/2022 3:30 PM Medical Record Number: [id number] Patient Account Number: [id number] Date of Birth/Sex: Treating RN: 05-Sep-1940 (82 y.o. Laymond Purser Primary Care Dawan Farney: Nilda Simmer  Other Clinician: Referring Julicia Krieger: Treating Bonnell Placzek/Extender: Elinor Parkinson, KRISTI Weeks in Treatment: 3 Active Inactive Electronic Signature(s) Signed: 12/17/2022 4:19:27 PM By: Angelina Pih Entered By: Angelina Pih on 12/17/2022 16:19:26 -------------------------------------------------------------------------------- Non-Wound Condition Assessment Details Patient Name: Date of Service: Christian Has MES R. 12/17/2022 3:30 PM Medical Record Number: [id number] Patient Account Number: [id number] Date of Birth/Sex: Treating RN: 1940/11/26 (82 y.o. Laymond Purser Primary Care Tijana Walder: Nilda Simmer Other Clinician: Referring Sheppard Luckenbach: Treating Montie Swiderski/Extender: Elinor Parkinson, KRISTI Weeks in Treatment: 3 Non-Wound Condition: Condition: Cellulitis Location: Leg Side: Bilateral Photos Notes new spot opened to left foot, new spot opened on upper right leg REIGN, BARTNICK R ([id number]) [id number] Page 5 of 6 Electronic Signature(s) Signed: 12/17/2022 4:43:31 PM By: Angelina Pih Entered By: Angelina Pih on 12/17/2022  15:48:08 -------------------------------------------------------------------------------- Pain Assessment Details Patient Name: Date of Service: Christian Has MES R. 12/17/2022 3:30 PM Medical Record Number: [id number] Patient Account Number: [id number] Date of Birth/Sex: Treating RN: 08/24/1941 (82 y.o. Laymond Purser Primary Care Kathlyne Loud: Nilda Simmer Other Clinician: Referring Quanah Majka: Treating Nel Stoneking/Extender: Elinor Parkinson, KRISTI Weeks in Treatment: 3 Active Problems Location of Pain Severity and Description of Pain Patient Has Paino No Site Locations Rate the pain. Current Pain Level: 0 Pain Management and Medication Current Pain Management: Electronic Signature(s) Signed: 12/17/2022 4:43:31 PM By: Angelina Pih Entered By: Angelina Pih on 12/17/2022 15:40:17 -------------------------------------------------------------------------------- Patient/Caregiver Education Details Patient Name: Date of Service: Christian Has MES R. 4/23/2024andnbsp3:30 PM Medical Record Number: [id number] Patient Account Number: [id number] Date of Birth/Gender: Treating RN: 1941/03/06 (82 y.o. Laymond Purser Primary Care Physician: Nilda Simmer Other Clinician: Referring Physician: Treating Physician/Extender: Burnis Kingfisher Weeks in Treatment: 3 Education Assessment Education Provided To: Patient Education Topics Provided Wound/Skin Impairment: Handouts: Other: care of healed wound Methods: Explain/Verbal Responses: State content correctly KWINT[place], [facility] R ([id number]) [id number] Page 6 of 6 Electronic Signature(s) Signed: 12/17/2022 4:43:31 PM By: Angelina Pih Entered By: Angelina Pih on 12/17/2022 16:19:42 -------------------------------------------------------------------------------- Vitals Details Patient Name: Date of Service: Bernell List, JA MES R. 12/17/2022 3:30 PM Medical Record Number: [id number] Patient Account Number:  [id number] Date of Birth/Sex: Treating RN: 06/23/1941 (82 y.o. Laymond Purser Primary Care Jaloni Davoli: Nilda Simmer Other Clinician: Referring Aloma Boch: Treating Carreen Milius/Extender: Elinor Parkinson, KRISTI Weeks in Treatment: 3 Vital Signs Time Taken: 15:39 Temperature (F): 98 Height (in): 69 Pulse (bpm): 62 Weight (lbs): 246 Respiratory Rate (breaths/min): 18 Body Mass Index (BMI): 36.3 Blood Pressure (mmHg): 116/78 Reference Range: 80 - 120 mg / dl Electronic Signature(s) Signed: 12/17/2022 4:43:31 PM By: Angelina Pih Entered By: Angelina Pih on 12/17/2022 15:40:09

## 2023-01-25 DIAGNOSIS — Z7901 Long term (current) use of anticoagulants: Secondary | ICD-10-CM

## 2023-01-28 DIAGNOSIS — E273 Drug-induced adrenocortical insufficiency: Secondary | ICD-10-CM | POA: Diagnosis present

## 2023-02-09 ENCOUNTER — Other Ambulatory Visit: Payer: Self-pay | Admitting: Cardiovascular Disease

## 2023-02-10 NOTE — Telephone Encounter (Signed)
Pt is scheduled on 6/28

## 2023-02-10 NOTE — Telephone Encounter (Signed)
Refill submitted. 

## 2023-02-10 NOTE — Telephone Encounter (Signed)
Please schedule 12 month F/U appointment for further 90 day refills. Thank you!

## 2023-02-20 NOTE — Progress Notes (Signed)
Cardiology Office Note  Date:  02/21/2023   ID:  Christian [name], DOB Mar 21, 1941, MRN [id number]  PCP:  Ethelda Chick, MD   Chief Complaint  Patient presents with   12 month f/u    Near syncopal event yesterday. Nausea. Medications reviewed by patient verbally.     HPI:  Christian [name] is a 82 year old gentleman with a history of   obesity CAD,  anterior wall myocardial infarction and LAD angioplasty in April 1997 at [facility].  moderate disease in a diagonal vessel, RCA and proximal circumflex, obtuse marginal branch with normal systolic function, diastolic dysfunction  DVT and PE, after first cancer operation at [facility] Bladder surgery,  obstructive sleep apnea, hypertension,  hyperlipidemia,  small AAA,   DVT April 2019 Chronic UTIs Diagnosed with Bladder cancer metastasized to intra-abdominal lymph nodes  Retroperitoneal lymphadenopathy on pembrolizumab treatments  who presents for routine followup of his coronary artery disease, recurrent DVT, atrial fibrillation/flutter.  LOV 7/23 Recent episodes of nausea vomiting for 4 days, general malaise, weakness Went to Nilda Simmer, sent to hospital:  In hospital 01/23/23: admission 8 days N/V + FTT picture. Found to have adrenal insufficiency secondary  Loaded with steroids with taper over the past month During hospitalization had atrial fibrillation, converting to normal sinus rhythm Was discharged on metoprolol succinate 25 daily, he reports only taking 12.5 daily recently  Seen by endocrinology yesterday, blood pressure 101/58 pulse 61 sitting position Follow-up for diabetes and adrenal insufficiency At checkout at endocrinology, had near syncope, 80 systolic by his report Given juice, crackers Was told he would be okay and was sent home  Orthostatic on today's visit again 104/69 supine down to 84/64 sitting 95 systolic later in the visit sitting on my recheck  Recent lab work reviewed May 2024 A1c greater than 11  Lab  work from August 2023 Total cholesterol 127 LDL 34  CT February 14, 2023 No new or enlarging metastatic disease in the chest abdomen pelvis Prior treatment with pembrolizumab, currently on hold given side effects  PT "ruined my day", made me do 20 squats  Glucose sometimes running low , has arm sensor  EKG personally reviewed by myself on todays visit EKG Interpretation Date/Time:  Friday February 21 2023 15:02:42 EDT Ventricular Rate:  66 PR Interval:  122 QRS Duration:  100 QT Interval:  430 QTC Calculation: 450 R Axis:   25  Text Interpretation: Normal sinus rhythm Normal ECG When compared with ECG of 17-Nov-2017 15:01, Vent. 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   PET showed widespread nodal involvement and concern for osseous mets, although bone scan was negative.  started gemcitabine/cisplatin 12/13/20 and he completed C6 04/06/21.  Scans 04/24/21 showed continued response with mass 5.4x2.3 cm down from 7.3x5.2 cm on initial imaging with no evidence of LAN and stable pulm nodules that may or may not be metastatic disase.   Given response, maintained on  avelumab 800 mg every 2 weeks started on 05/14/21.  admitted 10/9-10/14/22 for sepsis from urinary source received 5 cycles of avelumab  Repeat scans show continued decrease in size of adrenal mass (3.3x1.7 cm),   "Having reaction to immunotherapy medication"  Completed 11 cycles of avelumab, infusion reactions   Scans show continued  slight decrease in size of adrenal mass and otherwise stable disease. Started pembrolizuma every 6 weeks Has scans scheduled  Cardioversion 10/16/21 for fib/flutter, Noted to have rate 150 working with PT in hospital Recurrent E coli urospesis. Most recent admission in February 2023  Echo  2/23 NORMAL LEFT VENTRICULAR SYSTOLIC FUNCTION WITH MILD LVH   NORMAL RIGHT VENTRICULAR SYSTOLIC FUNCTION   VALVULAR REGURGITATION: TRIVIAL MR, MILD PR   NO VALVULAR STENOSIS   NO PRIOR STUDY FOR COMPARISON   Hospitalization March 2019 with UTI hematuria and sepsis Diagnosed with Escherichia coli bacteremia Positive blood cultures positive for ecoli and enterobacter  CT scan November 17, 2017 with no PE  12/02/2017 developed swelling and pain Ultrasound left popliteal vein posterior tibial vein peroneal veins with DVT  Hospitalized for DVT April 2019 Treated with eliquis 5 twice a day   ER 12/01/2016 Urine darker, Pain in ABD from coughing CT scan of his abdomen showing Kidney stone, 2 mm  Hematuria, Possible urinary tract infection, placed on Bactrim 10 days  Last stress test was in 03/2008. He had an inferior wall defect consistent with ischemia. Significant GI uptake artifact noted in the inferior wall. EF 42%. No cardiac cath was performed at that time in follow up.   ECHO in 02/2008 shows normal EF, otherwise normal study  PMH:   has a past medical history of Aortic aneurysm (HCC), Bladder cancer (HCC), Coronary artery disease, Diabetes mellitus without complication (HCC), DVT (deep venous thrombosis) (HCC) (10/07/2007), Glucose intolerance (impaired glucose tolerance), Hydrocele, Hyperlipidemia, Hypertension, MI (myocardial infarction) (HCC) (08/27/1995), Nephrolithiasis, and Sleep apnea.  PSH:    Past Surgical History:  Procedure Laterality Date   ANGIOPLASTY  1997   [place]   bladder removed     CARDIAC CATHETERIZATION     COLONOSCOPY  08/27/2011   normal.  [facility].   HYDROCELE EXCISION     PROSTATE SURGERY     prostatectomy with bladder resection.    Current Outpatient Medications  Medication Sig Dispense Refill   apixaban (ELIQUIS) 5 MG TABS tablet Take 1 tablet (5 mg total) by mouth 2 (two) times daily. 180 tablet 3   atorvastatin (LIPITOR) 10 MG  tablet Take 1 tablet (10 mg total) by mouth daily at 6 PM. 90 tablet 3   fluticasone (FLONASE) 50 MCG/ACT nasal spray Place 2 sprays into both nostrils as needed.     gabapentin (NEURONTIN) 300 MG capsule Take 600 mg by mouth 2 (two) times daily.     hydrocortisone (CORTEF) 10 MG tablet Take 20 mg in the morning and 10 mg at night     Insulin Pen Needle (NOVOFINE PEN NEEDLE) 32G X 6 MM MISC See admin instructions.     Lancets (ONETOUCH DELICA PLUS LANCET33G) MISC Apply 1 each topically 3 (three) times daily.     LANTUS SOLOSTAR 100 UNIT/ML Solostar Pen Inject 48 Units into the skin at bedtime.     levothyroxine (SYNTHROID) 112 MCG tablet Take 112 mcg by mouth daily before breakfast.     metFORMIN (GLUCOPHAGE-XR) 500 MG 24 hr tablet Take 500 mg by mouth daily with supper.     metoprolol succinate (TOPROL-XL) 25 MG 24 hr tablet TAKE 0.5 TABLETS BY MOUTH DAILY. TAKE WITH OR IMMEDIATELY FOLLOWING A MEAL. 45 tablet 0   nitroGLYCERIN (NITROSTAT) 0.4 MG SL tablet Place 1 tablet (0.4 mg total) under the tongue every 5 (five) minutes as needed for chest pain. 25 tablet 6   ONETOUCH ULTRA test strip 1  each (1 strip total) 3 (three) times daily Use as instructed. Dx code: E11.9     oxyCODONE (OXY IR/ROXICODONE) 5 MG immediate release tablet Take 5 mg by mouth every 4 (four) hours as needed for moderate pain.     triamcinolone cream (KENALOG) 0.5 % Apply 1 Application topically 2 (two) times daily as needed.     No current facility-administered medications for this visit.     Allergies:   Ceftriaxone, Other, Sulfasalazine, and Sulfa antibiotics   Social History:  The patient  reports that he has never smoked. He has never used smokeless tobacco. He reports that he does not drink alcohol and does not use drugs.   Family History:   family history includes Cancer in his father; Diabetes in his brother, mother, and sister; Lung cancer in his father; Stroke in his mother.   Review of Systems: Review of  Systems  Constitutional: Negative.   HENT: Negative.    Respiratory: Negative.    Cardiovascular: Negative.   Gastrointestinal: Negative.   Musculoskeletal: Negative.   Neurological: Negative.   Psychiatric/Behavioral: Negative.    All other systems reviewed and are negative.  PHYSICAL EXAM: VS:  BP (!) 100/58 (BP Location: Left Arm, Patient Position: Sitting, Cuff Size: Normal)   Pulse 66   Ht 5\' 10"  (1.778 m)   Wt 238 lb 3.2 oz (108 kg)   SpO2 95%   BMI 34.18 kg/m  , BMI Body mass index is 34.18 kg/m. Constitutional:  oriented to person, place, and time. No distress.  HENT:  Head: Grossly normal Eyes:  no discharge. No scleral icterus.  Neck: No JVD, no carotid bruits  Cardiovascular: Regular rate and rhythm, no murmurs appreciated Pulmonary/Chest: Clear to auscultation bilaterally, no wheezes or rails Abdominal: Soft.  no distension.  no tenderness.  Musculoskeletal: Normal range of motion Neurological:  normal muscle tone. Coordination normal. No atrophy Skin: Skin warm and dry Psychiatric: normal affect, pleasant   Recent Labs: No results found for requested labs within last 365 days.    Lipid Panel Lab Results  Component Value Date   CHOL 127 02/23/2018   HDL 34 (L) 02/23/2018   LDLCALC 65 02/23/2018   TRIG 141 02/23/2018    Wt Readings from Last 3 Encounters:  02/21/23 238 lb 3.2 oz (108 kg)  12/04/22 250 lb 1.6 oz (113.4 kg)  11/14/22 246 lb 4.8 oz (111.7 kg)     ASSESSMENT AND PLAN:  Bladder cancer Tumor regretion after treated with pembrolizumab , now held Side effects, adrenal, diabetes, hypotension Recent hospitalization with low-dose steroids Continues to be hypotensive, noted yesterday at endocrine, again today  Orthostatic hypotension In the setting of adrenal insufficiency after treatment with pembrolizumab, exacerbated by weight loss 2 options include fludrocortisone versus midodrine Given the immediacy of managing his hypertension, we  will start him on midodrine 10 mg 3 times a day Wife will check blood pressures at home Encouraged high fluid intake, liberalize salt intake Currently on steroids for adrenal insufficiency If blood pressure continues to run low, may need to add fludrocortisone  Paroxysmal atrial fibrillation/flutter Recent episode while in the hospital in the setting of adrenal insufficiency, converting to normal sinus rhythm Maintaining NSR, continue metoprolol succinate 12.5 daily, unable to titrate dose given hypotension Prior episode in the setting of urosepsis 09/2021 Underwent cardioversion, normal sinus rhythm restored On Eliquis 5 twice daily  Mixed hyperlipidemia  Goal LDL <70  DVT: History of recurrent episodes On anticoagulation, Eliquis  Atherosclerosis of native coronary  artery of native heart with stable angina pectoris (HCC) -  Currently with no symptoms of angina. No further workup at this time. Continue current medication regimen. Prior stress test 09/2019  OBESITY-MORBID (>100') -  We have encouraged continued exercise, careful diet management in an effort to lose weight.  Abdominal aortic aneurysm (AAA) without rupture (HCC) - Followed by vascular  Aortic atherosclerosis (HCC) CT scan reviewed showing moderate distal aortic and iliac arterial disease Coronary calcifications Non-smoker, cholesterol at goal   Total encounter time more than 40 minutes  Greater than 50% was spent in counseling and coordination of care with the patient   Orders Placed This Encounter  Procedures   EKG 12-Lead     Signed, Dossie Arbour, M.D., Ph.D. 02/21/2023  [facility] [place], [place] [phone]

## 2023-02-21 ENCOUNTER — Encounter: Payer: Self-pay | Admitting: Cardiovascular Disease

## 2023-02-21 ENCOUNTER — Ambulatory Visit: Payer: Medicare HMO | Attending: Cardiovascular Disease | Admitting: Cardiovascular Disease

## 2023-02-21 VITALS — BP 100/58 | HR 66 | Ht 70.0 in | Wt 238.2 lb

## 2023-02-21 DIAGNOSIS — I700 Atherosclerosis of aorta: Secondary | ICD-10-CM | POA: Diagnosis not present

## 2023-02-21 DIAGNOSIS — I82592 Chronic embolism and thrombosis of other specified deep vein of left lower extremity: Secondary | ICD-10-CM

## 2023-02-21 DIAGNOSIS — T451X5D Adverse effect of antineoplastic and immunosuppressive drugs, subsequent encounter: Secondary | ICD-10-CM

## 2023-02-21 DIAGNOSIS — I10 Essential (primary) hypertension: Secondary | ICD-10-CM | POA: Diagnosis not present

## 2023-02-21 DIAGNOSIS — E7800 Pure hypercholesterolemia, unspecified: Secondary | ICD-10-CM

## 2023-02-21 DIAGNOSIS — D6481 Anemia due to antineoplastic chemotherapy: Secondary | ICD-10-CM

## 2023-02-21 DIAGNOSIS — I25118 Atherosclerotic heart disease of native coronary artery with other forms of angina pectoris: Secondary | ICD-10-CM | POA: Diagnosis not present

## 2023-02-21 MED ORDER — MIDODRINE HCL 10 MG PO TABS: 10.0000 mg | ORAL_TABLET | Freq: Three times a day (TID) | ORAL | 3 refills | Status: DC

## 2023-02-21 NOTE — Patient Instructions (Addendum)
Medication Instructions:  Pressure are low today, possibly from side effects from pembrolizumab   Please start midodrine 10 mg three times a day Take one before getting up with glass with water, lay down 30 min Then sit on side of bed check pressure  Another pill at noon, another pill at dinner (5 pm)  If you need a refill on your cardiac medications before your next appointment, please call your pharmacy.   Lab work: No new labs needed  Testing/Procedures: No new testing needed  Follow-Up: At [facility], you and your health needs are our priority.  As part of our continuing mission to provide you with exceptional heart care, we have created designated Provider Care Teams.  These Care Teams include your primary Cardiologist (physician) and Advanced Practice Providers (APPs -  Physician Assistants and Nurse Practitioners) who all work together to provide you with the care you need, when you need it.  You will need a follow up appointment in 1 month  Providers on your designated Care Team:   Nicolasa Ducking, NP Eula Listen, PA-C Cadence Fransico Michael, [place]  COVID-19 Vaccine Information can be found at: [email or web address] For questions related to vaccine distribution or appointments, please email [email or web address] or call [phone].

## 2023-03-27 NOTE — Progress Notes (Signed)
Cardiology Office Note    Date:  03/28/2023   ID:  Christian [name], DOB 1941/02/10, MRN [id number]  PCP:  Ethelda Chick, MD  Cardiologist:  Julien Nordmann, MD  Electrophysiologist:  None   Chief Complaint: Follow-up  History of Present Illness:   Christian [name] is a 82 y.o. male with history of CAD with anterior wall MI in 1997 status post angioplasty to the LAD at [facility], PAF status post DCCV in 09/2021 on apixaban, diastolic dysfunction, adrenal mass/metastatic bladder cancer with metastasis to intra-abdominal lymph nodes diagnosed in 2022 complicated by immunotherapy induced adrenal insufficiency, type 1 diabetes mellitus, hypothyroidism, and polyneuropathy, DVT/PE following first cancer operation, AAA measuring 2.7 cm by ultrasound in 07/2022 (previously measuring 2.8 cm in 07/2021) followed by vascular surgery, HTN, HLD, obesity, OSA on CPAP, and chronic UTIs who presents for follow-up orthostatic hypotension.  Most recent ischemic evaluation via Lexiscan MPI in 07/2020 showed no significant ischemia with a small fixed mild perfusion defect in the apical region felt to represent attenuation artifact, LVEF 50%, and was overall low risk.  He as admitted to [facility] in 09/2021 with sepsis from chronic UTI complicated by E. coli bacteremia, acute on CKD, and A-fib/flutter.  Echo showed an EF of greater than 55%, normal wall motion, and trivial mitral regurgitation.  He underwent successful DCCV x 1 for A-fib on 10/16/2021 at [facility].  He was admitted to the hospital in 12/2022 with failure to thrive picture, nausea, and vomiting in the setting of adrenal insufficiency.  Hospital course was notable for A-fib converting to sinus rhythm.  He was last seen in our office in 01/2023 at which time he reported episode of near syncope with BP in the 80s at the checkout at time of endocrinology visit.  Had follow-up with Dr. Mariah Milling on 02/21/2023, orthostatic vital signs trended from 104/69 to 84/64 with sitting.  He  was started on midodrine 10 mg 3 times daily with recommendation to increase fluid and salt intake.  With regards to his adrenal insufficiency, he was on steroid therapy.  He comes in doing very well from a cardiac perspective and is without symptoms of angina or cardiac decompensation.  No dyspnea, palpitations, dizziness, presyncope, or syncope.  Blood pressures at home have ranged from 131-157/63-87.  No significant supine hypertension.  No falls, hematochezia, or melena.  Stable chronic lower extremity swelling without progressive orthopnea, early satiety, or PND.  Continues to note labile blood sugars.   Labs independently reviewed: 01/2023 - magnesium 1.9, Hgb 11.3, PLT 156, potassium 3.8, BUN 38, serum creatinine 1.4, albumin 3.7, AST/ALT not elevated, TSH 11.63, free T4 normal 03/2022 - TC 127, TG 319, HDL 29, LDL 34  Past Medical History:  Diagnosis Date   Aortic aneurysm (HCC)    followed by vascular surgery yearly.   Bladder cancer [facility])    s/p bladder resection. [facility]   Coronary artery disease    AMI anterior wall 1997; s/p PTCA to LAD 11/1995 [facility].   Diabetes mellitus without complication (HCC)    DVT (deep venous thrombosis) (HCC) 10/07/2007   Glucose intolerance (impaired glucose tolerance)    Hydrocele    Hyperlipidemia    Hypertension    MI (myocardial infarction) (HCC) 08/27/1995   Nephrolithiasis    Sleep apnea     Past Surgical History:  Procedure Laterality Date   ANGIOPLASTY  1997   [place]   bladder removed     CARDIAC CATHETERIZATION     COLONOSCOPY  08/27/2011   normal.  [facility].   HYDROCELE EXCISION     PROSTATE SURGERY     prostatectomy with bladder resection.    Current Medications: Current Meds  Medication Sig   apixaban (ELIQUIS) 5 MG TABS tablet Take 1 tablet (5 mg total) by mouth 2 (two) times daily.   atorvastatin (LIPITOR) 10 MG tablet Take 1 tablet (10 mg total) by mouth daily at 6 PM.   fluticasone (FLONASE) 50 MCG/ACT nasal  spray Place 2 sprays into both nostrils as needed.   gabapentin (NEURONTIN) 300 MG capsule Take 600 mg by mouth 2 (two) times daily.   hydrocortisone (CORTEF) 10 MG tablet Take 20 mg in the morning and 10 mg at night   Insulin Pen Needle (NOVOFINE PEN NEEDLE) 32G X 6 MM MISC See admin instructions.   Lancets (ONETOUCH DELICA PLUS LANCET33G) MISC Apply 1 each topically 3 (three) times daily.   LANTUS SOLOSTAR 100 UNIT/ML Solostar Pen Inject 48 Units into the skin at bedtime.   levothyroxine (SYNTHROID) 112 MCG tablet Take 112 mcg by mouth daily before breakfast.   metFORMIN (GLUCOPHAGE-XR) 500 MG 24 hr tablet Take 500 mg by mouth daily with supper.   metoprolol succinate (TOPROL-XL) 25 MG 24 hr tablet TAKE 0.5 TABLETS BY MOUTH DAILY. TAKE WITH OR IMMEDIATELY FOLLOWING A MEAL.   midodrine (PROAMATINE) 10 MG tablet Take 1 tablet (10 mg total) by mouth 3 (three) times daily.   nitroGLYCERIN (NITROSTAT) 0.4 MG SL tablet Place 1 tablet (0.4 mg total) under the tongue every 5 (five) minutes as needed for chest pain.   ONETOUCH ULTRA test strip 1 each (1 strip total) 3 (three) times daily Use as instructed. Dx code: E11.9   triamcinolone cream (KENALOG) 0.5 % Apply 1 Application topically 2 (two) times daily as needed.    Allergies:   Ceftriaxone, Other, Sulfasalazine, and Sulfa antibiotics   Social History   Socioeconomic History   Marital status: Married    Spouse name: Not on file   Number of children: Not on file   Years of education: Not on file   Highest education level: Not on file  Occupational History   Occupation: retired  Tobacco Use   Smoking status: Never   Smokeless tobacco: Never   Tobacco comments:    tobacco use- no   Vaping Use   Vaping status: Never Used  Substance and Sexual Activity   Alcohol use: No   Drug use: No   Sexual activity: Yes  Other Topics Concern   Not on file  Social History Narrative   Marital status:  Married x 28 years; happily married; third  marriage.      Children: none; 2 stepdaughters; 3 grandchildren.  3 gg.      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      Tobacco:  None      Alcohol:  None; socially      Drugs: none     Exercise: Regularly exercise/walking; walking daily.  No formal exercise plan in 2018.      ADLs: independent with ADLs.   Drives; no assistant device.      Advanced Directives:  Yes; FULL CODE.  No prolonged measures.      Seatbelt: 100%         Social Determinants of Health   Financial Resource Strain: Low Risk  (01/06/2023)   Received from [facility], [facility]   Overall Financial Resource  Strain (CARDIA)    Difficulty of Paying Living Expenses: Not hard at all  Food Insecurity: No Food Insecurity (01/06/2023)   Received from [facility], [facility]   Hunger Vital Sign    Worried About Running Out of Food in the Last Year: Never true    Ran Out of Food in the Last Year: Never true  Transportation Needs: No Transportation Needs (01/06/2023)   Received from [facility], [facility]   [facility] - Transportation    In the past 12 months, has lack of transportation kept you from medical appointments or from getting medications?: No    Lack of Transportation (Non-Medical): No  Physical Activity: Insufficiently Active (01/13/2020)   Received from [facility], [facility]   Exercise Vital Sign    Days of Exercise per Week: 3 days    Minutes of Exercise per Session: 30 min  Stress: No Stress Concern Present (01/13/2020)   Received from [facility], [facility]   [employer] of Occupational Health - Occupational Stress Questionnaire    Feeling of Stress : Not at all  Social Connections: Moderately Integrated (01/13/2020)   Received from [facility], [facility]    Social Connection and Isolation Panel [NHANES]    Frequency of Communication with Friends and Family: More than three times a week    Frequency of Social Gatherings with Friends and Family: More than three times a week    Attends Religious Services: More than 4 times per year    Active Member of [employer] or Organizations: No    Attends Engineer, structural: Never    Marital Status: Married     Family History:  The patient's family history includes Cancer in his father; Diabetes in his brother, mother, and sister; Lung cancer in his father; Stroke in his mother.  ROS:   12-point review of systems is negative unless otherwise noted in the HPI.   EKGs/Labs/Other Studies Reviewed:    Studies reviewed were summarized above. The additional studies were reviewed today:  Lexiscan MPI 10/08/2019: Pharmacological myocardial perfusion imaging study with no significant  Ischemia Small fixed mild intensity perfusion defect apical region likely secondary to attenuation artifact Normal wall motion, EF estimated at 50% No EKG changes concerning for ischemia at peak stress or in recovery. Low risk scan __________  2D echo 10/12/2021 ([facility]): AORTIC ROOT          Size: Not seen    Dissection: INDETERM FOR DISSECTION   AORTIC VALVE      Leaflets: Tricuspid             Morphology: MILDLY THICKENED      Mobility: Fully Mobile   LEFT VENTRICLE                                      Anterior: Normal          Size: Normal                                 Lateral: Normal   Contraction: Normal  Septal: Normal    Closest EF: >55%(Estimated)  Calc.EF: 65% (2D)      Apical: Normal     LV masses: No Masses                             Inferior: Normal           LVH: MILD LVH                             Posterior: Normal  Dias.FxClass: N/A   MITRAL VALVE      Leaflets: Normal                  Mobility: Fully mobile    Morphology: Normal   LEFT ATRIUM          Size:  Normal     LA masses: No masses                Normal IAS   MAIN PA          Size: Not seen   PULMONIC VALVE    Morphology: Normal      Mobility: Fully Mobile   RIGHT VENTRICLE          Size: Normal                    Free wall: Normal   Contraction: Normal                    RV masses: No Masses   TRICUSPID VALVE      Leaflets: Normal                  Mobility: Fully mobile    Morphology: Normal   RIGHT ATRIUM          Size: Normal                     RA Other: None     RA masses: No masses   PERICARDIUM        Fluid: No effusion   INFERIOR VENACAVA          Size: Normal     ABNORMAL RESPIRATORY COLLAPSE   DOPPLER ECHO and OTHER SPECIAL PROCEDURES ------------------------------------     Aortic: No AR                  No AS      Mitral: TRIVIAL MR             No MS     MV Inflow E Vel.= 95.0 cm/s  MV Annulus E'Vel.= 9.0 cm/s  E/E'Ratio= 11   Tricuspid: No TR                  No TS   Pulmonary: MILD PR                No PS       Other:   INTERPRETATION    NORMAL LEFT VENTRICULAR SYSTOLIC FUNCTION WITH MILD LVH    NORMAL RIGHT VENTRICULAR SYSTOLIC FUNCTION    VALVULAR REGURGITATION: TRIVIAL MR, MILD PR    NO VALVULAR STENOSIS    NO PRIOR STUDY FOR COMPARISON  __________  DCCV 10/16/2021 ([facility]): PROCEDURE   Shocks Delivered:       #1 @ 200 joules with 85 ohms   RESULTS    Successful DC  cardioversion performed.     EKG:  EKG is not ordered today.    Recent Labs: No results found for requested labs within last 365 days.  Recent Lipid Panel    Component Value Date/Time   CHOL 127 02/23/2018 1101   TRIG 141 02/23/2018 1101   HDL 34 (L) 02/23/2018 1101   CHOLHDL 3.7 02/23/2018 1101   CHOLHDL 3.5 04/16/2016 1154   VLDL 45 (H) 04/16/2016 1154   LDLCALC 65 02/23/2018 1101    PHYSICAL EXAM:    VS:  BP 124/68 (BP Location: Left Arm, Patient Position: Sitting, Cuff Size: Normal)   Pulse 63   Ht 5\' 10"  (1.778 m)   Wt 252 lb (114.3 kg)   SpO2 94%    BMI 36.16 kg/m   BMI: Body mass index is 36.16 kg/m.  Physical Exam Vitals reviewed.  Constitutional:      Appearance: He is well-developed.  HENT:     Head: Normocephalic and atraumatic.  Eyes:     General:        Right eye: No discharge.        Left eye: No discharge.  Neck:     Vascular: No JVD.  Cardiovascular:     Rate and Rhythm: Normal rate and regular rhythm.     Heart sounds: Normal heart sounds, S1 normal and S2 normal. Heart sounds not distant. No midsystolic click and no opening snap. No murmur heard.    No friction rub.  Pulmonary:     Effort: Pulmonary effort is normal. No respiratory distress.     Breath sounds: Normal breath sounds. No decreased breath sounds, wheezing or rales.  Chest:     Chest wall: No tenderness.  Abdominal:     General: There is no distension.     Palpations: Abdomen is soft.     Tenderness: There is no abdominal tenderness.  Musculoskeletal:     Cervical back: Normal range of motion.     Right lower leg: Edema present.     Left lower leg: Edema present.     Comments: Mild woody bilateral lower extremity swelling with mild chronic hyperpigmentation.  Skin:    General: Skin is warm and dry.     Nails: There is no clubbing.  Neurological:     Mental Status: He is alert and oriented to person, place, and time.  Psychiatric:        Speech: Speech normal.        Behavior: Behavior normal.        Thought Content: Thought content normal.        Judgment: Judgment normal.     Wt Readings from Last 3 Encounters:  03/28/23 252 lb (114.3 kg)  02/21/23 238 lb 3.2 oz (108 kg)  12/04/22 250 lb 1.6 oz (113.4 kg)     ASSESSMENT & PLAN:   Orthostatic hypotension: Much improved/resolved following addition of midodrine 10 mg 3 times daily.  No issues with his supine hypertension.  Hopefully, moving forward we can look to taper off midodrine.  Continue adequate hydration.  Possibly exacerbated by adrenal insufficiency.   CAD involving the  native coronary arteries without angina: He is doing well without symptoms of angina or cardiac decompensation.  He remains on apixaban, given underlying A-fib, will need an effort to minimize bleeding risk.  Continue aggressive risk factor modification and secondary prevention including atorvastatin and Toprol-XL.  No indication for ischemic testing at this time.  PAF: Maintaining sinus rhythm by physical exam with  Toprol-XL CHA2DS2-VASc at least 5 (HTN, age x 2, DM, vascular disease).  He remains on apixaban 5 mg twice daily does not meet reduced dosing criteria.  No symptoms concerning for bleeding or falls.  Recent labs stable.  HTN: Blood pressure improved and well-controlled.  Remains on midodrine as outlined above given orthostasis along with Toprol-XL.  If needed moving forward, could consider stopping Toprol-XL.  Aortic atherosclerosis/HLD: LDL 34.  He remains on atorvastatin.  Ectatic abdominal aorta: Abdominal aortic ultrasound from 07/2022 showed a stable ectatic distal abdominal aorta measuring 2.7 cm (previously 2.8 in 07/2021).  Followed by vascular surgery.  History of remote DVT/PE: Remains on apixaban as outlined above.  Bladder cancer with metastasis to intra-abdominal lymph nodes complicated by adrenal insufficiency, type I DM, hypothyroidism, and anemia with antineoplastic chemotherapy: Ongoing management per oncology and endocrinology with [facility].   Disposition: F/u with Dr. Mariah Milling or an APP in 6 months.   Medication Adjustments/Labs and Tests Ordered: Current medicines are reviewed at length with the patient today.  Concerns regarding medicines are outlined above. Medication changes, Labs and Tests ordered today are summarized above and listed in the Patient Instructions accessible in Encounters.   Signed, Eula Listen, PA-C 03/28/2023 3:17 PM     [facility] [place] [address] [place], [place] [address] [phone]

## 2023-03-28 ENCOUNTER — Encounter: Payer: Self-pay | Admitting: Physician Assistant

## 2023-03-28 ENCOUNTER — Ambulatory Visit: Payer: Medicare HMO | Attending: Physician Assistant | Admitting: Physician Assistant

## 2023-03-28 VITALS — BP 124/68 | HR 63 | Ht 70.0 in | Wt 252.0 lb

## 2023-03-28 DIAGNOSIS — E785 Hyperlipidemia, unspecified: Secondary | ICD-10-CM

## 2023-03-28 DIAGNOSIS — I480 Paroxysmal atrial fibrillation: Secondary | ICD-10-CM

## 2023-03-28 DIAGNOSIS — Z8551 Personal history of malignant neoplasm of bladder: Secondary | ICD-10-CM

## 2023-03-28 DIAGNOSIS — I700 Atherosclerosis of aorta: Secondary | ICD-10-CM

## 2023-03-28 DIAGNOSIS — I951 Orthostatic hypotension: Secondary | ICD-10-CM

## 2023-03-28 DIAGNOSIS — Z86718 Personal history of other venous thrombosis and embolism: Secondary | ICD-10-CM

## 2023-03-28 DIAGNOSIS — I10 Essential (primary) hypertension: Secondary | ICD-10-CM

## 2023-03-28 DIAGNOSIS — I2510 Atherosclerotic heart disease of native coronary artery without angina pectoris: Secondary | ICD-10-CM

## 2023-03-28 DIAGNOSIS — I77811 Abdominal aortic ectasia: Secondary | ICD-10-CM

## 2023-03-28 NOTE — Patient Instructions (Signed)
Medication Instructions:  No changes at this time.   *If you need a refill on your cardiac medications before your next appointment, please call your pharmacy*   Lab Work: None  If you have labs (blood work) drawn today and your tests are completely normal, you will receive your results only by: MyChart Message (if you have MyChart) OR A paper copy in the mail If you have any lab test that is abnormal or we need to change your treatment, we will call you to review the results.   Testing/Procedures: None   Follow-Up: At [facility], you and your health needs are our priority.  As part of our continuing mission to provide you with exceptional heart care, we have created designated Provider Care Teams.  These Care Teams include your primary Cardiologist (physician) and Advanced Practice Providers (APPs -  Physician Assistants and Nurse Practitioners) who all work together to provide you with the care you need, when you need it.  Your next appointment:   6 month(s)  Provider:   Timothy Gollan, MD or Ryan Dunn, PA-C     

## 2023-05-14 ENCOUNTER — Other Ambulatory Visit: Payer: Self-pay | Admitting: Cardiovascular Disease

## 2023-07-29 ENCOUNTER — Encounter (INDEPENDENT_AMBULATORY_CARE_PROVIDER_SITE_OTHER): Payer: Self-pay | Admitting: Vascular Surgery

## 2023-07-29 ENCOUNTER — Ambulatory Visit (INDEPENDENT_AMBULATORY_CARE_PROVIDER_SITE_OTHER): Payer: Medicare HMO | Admitting: Vascular Surgery

## 2023-07-29 ENCOUNTER — Ambulatory Visit (INDEPENDENT_AMBULATORY_CARE_PROVIDER_SITE_OTHER): Payer: Medicare HMO

## 2023-07-29 VITALS — BP 149/67 | HR 61 | Resp 18 | Ht 70.0 in | Wt 260.2 lb

## 2023-07-29 DIAGNOSIS — C679 Malignant neoplasm of bladder, unspecified: Secondary | ICD-10-CM

## 2023-07-29 DIAGNOSIS — I7143 Infrarenal abdominal aortic aneurysm, without rupture: Secondary | ICD-10-CM

## 2023-07-29 DIAGNOSIS — E785 Hyperlipidemia, unspecified: Secondary | ICD-10-CM | POA: Insufficient documentation

## 2023-07-29 DIAGNOSIS — I10 Essential (primary) hypertension: Secondary | ICD-10-CM | POA: Diagnosis not present

## 2023-07-29 DIAGNOSIS — C772 Secondary and unspecified malignant neoplasm of intra-abdominal lymph nodes: Secondary | ICD-10-CM

## 2023-07-29 NOTE — Progress Notes (Signed)
MRN : [id number]  Christian [name] is a 82 y.o. (05/20/1941) male who presents with chief complaint of  Chief Complaint  Patient presents with   Follow-up    RM 6, 1 year aaa.  .  History of Present Illness: Patient returns today in follow up of normal aortic aneurysm.  This was found several years ago on CT scan to be around 3 cm in diameter.  He has had no aneurysm related symptoms.  He is had many other medical issues which she is still dealing with. Specifically, the patient denies new back or abdominal pain, or signs of peripheral embolization.  Duplex today shows a stable aneurysm of just less than 3 cm in maximal diameter measuring between 2.8 and 2.9 cm.  Current Outpatient Medications  Medication Sig Dispense Refill   apixaban (ELIQUIS) 5 MG TABS tablet Take 1 tablet (5 mg total) by mouth 2 (two) times daily. 180 tablet 3   atorvastatin (LIPITOR) 10 MG tablet Take 1 tablet (10 mg total) by mouth daily at 6 PM. 90 tablet 3   Continuous Glucose Receiver (DEXCOM G7 RECEIVER) DEVI See admin instructions.     Continuous Glucose Sensor (DEXCOM G7 SENSOR) MISC USE 1 EACH EVERY 10 DAYS     fluticasone (FLONASE) 50 MCG/ACT nasal spray Place 2 sprays into both nostrils as needed.     gabapentin (NEURONTIN) 300 MG capsule Take 600 mg by mouth 2 (two) times daily.     hydrocortisone (CORTEF) 10 MG tablet Take 20 mg in the morning and 10 mg at night     Insulin Aspart FlexPen (NOVOLOG) 100 UNIT/ML Inject 25-40 units subcutaneously before meals three times daily as directed. Max Total Daily Dose 130 units per day.     insulin degludec (TRESIBA) 100 UNIT/ML FlexTouch Pen Inject into the skin.     Insulin Pen Needle (NOVOFINE PEN NEEDLE) 32G X 6 MM MISC See admin instructions.     Lancets (ONETOUCH DELICA PLUS LANCET33G) MISC Apply 1 each topically 3 (three) times daily.     LANTUS SOLOSTAR 100 UNIT/ML Solostar Pen Inject 48 Units into the skin at bedtime.     levothyroxine (SYNTHROID) 112 MCG  tablet Take 112 mcg by mouth daily before breakfast.     metFORMIN (GLUCOPHAGE-XR) 500 MG 24 hr tablet Take 500 mg by mouth daily with supper.     metoprolol succinate (TOPROL-XL) 25 MG 24 hr tablet TAKE 0.5 TABLETS BY MOUTH DAILY. TAKE WITH OR IMMEDIATELY FOLLOWING A MEAL. 45 tablet 0   midodrine (PROAMATINE) 10 MG tablet Take 1 tablet (10 mg total) by mouth 3 (three) times daily. 90 tablet 3   nitroGLYCERIN (NITROSTAT) 0.4 MG SL tablet Place 1 tablet (0.4 mg total) under the tongue every 5 (five) minutes as needed for chest pain. 25 tablet 6   ONETOUCH ULTRA test strip 1 each (1 strip total) 3 (three) times daily Use as instructed. Dx code: E11.9     No current facility-administered medications for this visit.    Past Medical History:  Diagnosis Date   Aortic aneurysm (HCC)    followed by vascular surgery yearly.   Bladder cancer [facility])    s/p bladder resection. [facility]   Coronary artery disease    AMI anterior wall 1997; s/p PTCA to LAD 11/1995 [facility].   Diabetes mellitus without complication (HCC)    DVT (deep venous thrombosis) (HCC) 10/07/2007   Glucose intolerance (impaired glucose tolerance)    Hydrocele    Hyperlipidemia  Hypertension    MI (myocardial infarction) (HCC) 08/27/1995   Nephrolithiasis    Sleep apnea     Past Surgical History:  Procedure Laterality Date   ANGIOPLASTY  1997   [place]   bladder removed     CARDIAC CATHETERIZATION     COLONOSCOPY  08/27/2011   normal.  [facility].   HYDROCELE EXCISION     PROSTATE SURGERY     prostatectomy with bladder resection.     Social History   Tobacco Use   Smoking status: Never   Smokeless tobacco: Never   Tobacco comments:    tobacco use- no   Vaping Use   Vaping status: Never Used  Substance Use Topics   Alcohol use: No   Drug use: No      Family History  Problem Relation Age of Onset   Stroke Mother    Diabetes Mother    Lung cancer Father    Cancer Father        lung cancer    Diabetes Sister    Diabetes Brother      Allergies  Allergen Reactions   Ceftriaxone Hives    Tolerated cefazolin 09/2021 without reaction   Other Hives   Sulfasalazine Hives   Sulfa Antibiotics Hives     REVIEW OF SYSTEMS (Negative unless checked)   Constitutional: [] Weight loss  [] Fever  [] Chills Cardiac: [] Chest pain   [] Chest pressure   [] Palpitations   [] Shortness of breath when laying flat   [] Shortness of breath at rest   [] Shortness of breath with exertion. Vascular:  [] Pain in legs with walking   [] Pain in legs at rest   [] Pain in legs when laying flat   [] Claudication   [] Pain in feet when walking  [] Pain in feet at rest  [] Pain in feet when laying flat   [x] History of DVT   [] Phlebitis   [] Swelling in legs   [] Varicose veins   [] Non-healing ulcers Pulmonary:   [] Uses home oxygen   [] Productive cough   [] Hemoptysis   [] Wheeze  [] COPD   [] Asthma Neurologic:  [] Dizziness  [] Blackouts   [] Seizures   [] History of stroke   [] History of TIA  [] Aphasia   [] Temporary blindness   [] Dysphagia   [] Weakness or numbness in arms   [x] Weakness or numbness in legs Musculoskeletal:  [x] Arthritis   [] Joint swelling   [] Joint pain   [] Low back pain Hematologic:  [] Easy bruising  [] Easy bleeding   [] Hypercoagulable state   [] Anemic   Gastrointestinal:  [] Blood in stool   [] Vomiting blood  [] Gastroesophageal reflux/heartburn   [] Abdominal pain Genitourinary:  [x] Chronic kidney disease   [] Difficult urination  [] Frequent urination  [] Burning with urination   [] Hematuria Skin:  [] Rashes   [] Ulcers   [] Wounds Psychological:  [] History of anxiety   []  History of major depression.  Physical Examination  BP (!) 149/67 (BP Location: Left Wrist)   Pulse 61   Resp 18   Ht 5\' 10"  (1.778 m)   Wt 260 lb 3.2 oz (118 kg)   BMI 37.33 kg/m  Gen:  WD/WN, NAD. Obese  Head: [place]/AT, No temporalis wasting. Ear/Nose/Throat: Hearing grossly intact, nares w/o erythema or drainage Eyes: Conjunctiva clear. Sclera  non-icteric Neck: Supple.  Trachea midline Pulmonary:  Good air movement, no use of accessory muscles.  Cardiac: RRR, no JVD Vascular:  Vessel Right Left  Radial Palpable Palpable  Gastrointestinal: soft, non-tender/non-distended. No guarding/reflex.  Musculoskeletal: M/S 5/5 throughout.  No deformity or atrophy. Mild LE edema. Neurologic: Sensation grossly intact in extremities.  Symmetrical.  Speech is fluent.  Psychiatric: Judgment intact, Mood & affect appropriate for pt's clinical situation. Dermatologic: No rashes or ulcers noted.  No cellulitis or open wounds.      Labs No results found for this or any previous visit (from the past 2160 hour(s)).  Radiology No results found.  Assessment/Plan  Abdominal aortic aneurysm Duplex today shows a stable aneurysm of just less than 3 cm in maximal diameter measuring between 2.8 and 2.9 cm.  No role for intervention at this size.  Blood pressure control and remaining abstinent from tobacco are important to avoid growth.  Follow-up in 1 year with duplex.  Hyperlipidemia lipid control important in reducing the progression of atherosclerotic disease. Continue statin therapy     Bladder cancer S/p resection by Urology and getting chemo and immune therapy   Essential hypertension blood pressure control important in reducing the progression of atherosclerotic disease and aneurysmal growth. On appropriate oral medications.  Festus Barren, MD  07/29/2023 1:34 PM    This note was created with Dragon medical transcription system.  Any errors from dictation are purely unintentional

## 2023-07-29 NOTE — Assessment & Plan Note (Signed)
Duplex today shows a stable aneurysm of just less than 3 cm in maximal diameter measuring between 2.8 and 2.9 cm.  No role for intervention at this size.  Blood pressure control and remaining abstinent from tobacco are important to avoid growth.  Follow-up in 1 year with duplex.

## 2023-08-13 ENCOUNTER — Other Ambulatory Visit: Payer: Self-pay | Admitting: Cardiovascular Disease

## 2023-10-10 ENCOUNTER — Encounter: Payer: Self-pay | Admitting: Emergency Medicine

## 2023-10-10 ENCOUNTER — Inpatient Hospital Stay
Admission: EM | Admit: 2023-10-10 | Discharge: 2023-10-13 | DRG: 871 | Disposition: A | Discharge status: Home / Self Care | Payer: Medicare HMO | Attending: Internal Medicine | Admitting: Internal Medicine

## 2023-10-10 ENCOUNTER — Other Ambulatory Visit: Payer: Self-pay

## 2023-10-10 ENCOUNTER — Emergency Department: Payer: Medicare HMO

## 2023-10-10 DIAGNOSIS — B974 Respiratory syncytial virus as the cause of diseases classified elsewhere: Secondary | ICD-10-CM | POA: Diagnosis present

## 2023-10-10 DIAGNOSIS — E1022 Type 1 diabetes mellitus with diabetic chronic kidney disease: Secondary | ICD-10-CM | POA: Diagnosis present

## 2023-10-10 DIAGNOSIS — E236 Other disorders of pituitary gland: Secondary | ICD-10-CM | POA: Diagnosis present

## 2023-10-10 DIAGNOSIS — I4891 Unspecified atrial fibrillation: Secondary | ICD-10-CM | POA: Diagnosis present

## 2023-10-10 DIAGNOSIS — E039 Hypothyroidism, unspecified: Secondary | ICD-10-CM | POA: Diagnosis present

## 2023-10-10 DIAGNOSIS — Z801 Family history of malignant neoplasm of trachea, bronchus and lung: Secondary | ICD-10-CM

## 2023-10-10 DIAGNOSIS — I2510 Atherosclerotic heart disease of native coronary artery without angina pectoris: Secondary | ICD-10-CM | POA: Diagnosis present

## 2023-10-10 DIAGNOSIS — R6520 Severe sepsis without septic shock: Secondary | ICD-10-CM | POA: Diagnosis present

## 2023-10-10 DIAGNOSIS — Z833 Family history of diabetes mellitus: Secondary | ICD-10-CM

## 2023-10-10 DIAGNOSIS — N179 Acute kidney failure, unspecified: Secondary | ICD-10-CM | POA: Diagnosis present

## 2023-10-10 DIAGNOSIS — E1065 Type 1 diabetes mellitus with hyperglycemia: Secondary | ICD-10-CM

## 2023-10-10 DIAGNOSIS — R4182 Altered mental status, unspecified: Principal | ICD-10-CM

## 2023-10-10 DIAGNOSIS — C772 Secondary and unspecified malignant neoplasm of intra-abdominal lymph nodes: Secondary | ICD-10-CM | POA: Diagnosis present

## 2023-10-10 DIAGNOSIS — E273 Drug-induced adrenocortical insufficiency: Secondary | ICD-10-CM | POA: Diagnosis present

## 2023-10-10 DIAGNOSIS — E271 Primary adrenocortical insufficiency: Secondary | ICD-10-CM | POA: Diagnosis present

## 2023-10-10 DIAGNOSIS — I10 Essential (primary) hypertension: Secondary | ICD-10-CM | POA: Diagnosis present

## 2023-10-10 DIAGNOSIS — Z7984 Long term (current) use of oral hypoglycemic drugs: Secondary | ICD-10-CM

## 2023-10-10 DIAGNOSIS — Z79890 Hormone replacement therapy (postmenopausal): Secondary | ICD-10-CM

## 2023-10-10 DIAGNOSIS — C679 Malignant neoplasm of bladder, unspecified: Secondary | ICD-10-CM | POA: Diagnosis present

## 2023-10-10 DIAGNOSIS — I129 Hypertensive chronic kidney disease with stage 1 through stage 4 chronic kidney disease, or unspecified chronic kidney disease: Secondary | ICD-10-CM | POA: Diagnosis present

## 2023-10-10 DIAGNOSIS — E785 Hyperlipidemia, unspecified: Secondary | ICD-10-CM | POA: Diagnosis present

## 2023-10-10 DIAGNOSIS — I252 Old myocardial infarction: Secondary | ICD-10-CM

## 2023-10-10 DIAGNOSIS — Z881 Allergy status to other antibiotic agents status: Secondary | ICD-10-CM

## 2023-10-10 DIAGNOSIS — Z87440 Personal history of urinary (tract) infections: Secondary | ICD-10-CM

## 2023-10-10 DIAGNOSIS — A419 Sepsis, unspecified organism: Principal | ICD-10-CM | POA: Diagnosis present

## 2023-10-10 DIAGNOSIS — Z7985 Long-term (current) use of injectable non-insulin antidiabetic drugs: Secondary | ICD-10-CM

## 2023-10-10 DIAGNOSIS — N390 Urinary tract infection, site not specified: Secondary | ICD-10-CM | POA: Diagnosis present

## 2023-10-10 DIAGNOSIS — Z823 Family history of stroke: Secondary | ICD-10-CM

## 2023-10-10 DIAGNOSIS — Z9861 Coronary angioplasty status: Secondary | ICD-10-CM

## 2023-10-10 DIAGNOSIS — I480 Paroxysmal atrial fibrillation: Secondary | ICD-10-CM | POA: Diagnosis present

## 2023-10-10 DIAGNOSIS — I719 Aortic aneurysm of unspecified site, without rupture: Secondary | ICD-10-CM | POA: Diagnosis present

## 2023-10-10 DIAGNOSIS — Z794 Long term (current) use of insulin: Secondary | ICD-10-CM

## 2023-10-10 DIAGNOSIS — Z8551 Personal history of malignant neoplasm of bladder: Secondary | ICD-10-CM

## 2023-10-10 DIAGNOSIS — Z882 Allergy status to sulfonamides status: Secondary | ICD-10-CM

## 2023-10-10 DIAGNOSIS — Z1152 Encounter for screening for COVID-19: Secondary | ICD-10-CM

## 2023-10-10 DIAGNOSIS — Z7901 Long term (current) use of anticoagulants: Secondary | ICD-10-CM

## 2023-10-10 DIAGNOSIS — G4730 Sleep apnea, unspecified: Secondary | ICD-10-CM | POA: Diagnosis present

## 2023-10-10 DIAGNOSIS — G9341 Metabolic encephalopathy: Secondary | ICD-10-CM

## 2023-10-10 DIAGNOSIS — D849 Immunodeficiency, unspecified: Secondary | ICD-10-CM | POA: Diagnosis present

## 2023-10-10 DIAGNOSIS — N189 Chronic kidney disease, unspecified: Secondary | ICD-10-CM | POA: Diagnosis present

## 2023-10-10 DIAGNOSIS — N1831 Chronic kidney disease, stage 3a: Secondary | ICD-10-CM | POA: Diagnosis present

## 2023-10-10 DIAGNOSIS — B338 Other specified viral diseases: Secondary | ICD-10-CM

## 2023-10-10 DIAGNOSIS — Z936 Other artificial openings of urinary tract status: Secondary | ICD-10-CM

## 2023-10-10 DIAGNOSIS — Z79899 Other long term (current) drug therapy: Secondary | ICD-10-CM

## 2023-10-10 LAB — URINALYSIS, W/ REFLEX TO CULTURE (INFECTION SUSPECTED)
Bilirubin Urine: NEGATIVE
Glucose, UA: NEGATIVE mg/dL
Ketones, ur: NEGATIVE mg/dL
Leukocytes,Ua: NEGATIVE
Nitrite: NEGATIVE
Protein, ur: 30 mg/dL — AB
Specific Gravity, Urine: 1.012 (ref 1.005–1.030)
pH: 5 (ref 5.0–8.0)

## 2023-10-10 LAB — CBC WITH DIFFERENTIAL/PLATELET
Abs Immature Granulocytes: 0.1 K/uL — ABNORMAL HIGH (ref 0.00–0.07)
Basophils Absolute: 0.1 K/uL (ref 0.0–0.1)
Basophils Relative: 1 %
Eosinophils Absolute: 0.3 K/uL (ref 0.0–0.5)
Eosinophils Relative: 2 %
HCT: 38.8 % — ABNORMAL LOW (ref 39.0–52.0)
Hemoglobin: 13 g/dL (ref 13.0–17.0)
Immature Granulocytes: 1 %
Lymphocytes Relative: 18 %
Lymphs Abs: 2 K/uL (ref 0.7–4.0)
MCH: 34.3 pg — ABNORMAL HIGH (ref 26.0–34.0)
MCHC: 33.5 g/dL (ref 30.0–36.0)
MCV: 102.4 fL — ABNORMAL HIGH (ref 80.0–100.0)
Monocytes Absolute: 1.8 K/uL — ABNORMAL HIGH (ref 0.1–1.0)
Monocytes Relative: 15 %
Neutro Abs: 7.2 K/uL (ref 1.7–7.7)
Neutrophils Relative %: 63 %
Platelets: 194 K/uL (ref 150–400)
RBC: 3.79 MIL/uL — ABNORMAL LOW (ref 4.22–5.81)
RDW: 13.2 % (ref 11.5–15.5)
WBC: 11.4 K/uL — ABNORMAL HIGH (ref 4.0–10.5)
nRBC: 0 % (ref 0.0–0.2)

## 2023-10-10 LAB — LACTIC ACID, PLASMA
Lactic Acid, Venous: 1.8 mmol/L (ref 0.5–1.9)
Lactic Acid, Venous: 1.3 mmol/L (ref 0.5–1.9)

## 2023-10-10 LAB — COMPREHENSIVE METABOLIC PANEL
ALT: 10 U/L (ref 0–44)
AST: 16 U/L (ref 15–41)
Albumin: 4 g/dL (ref 3.5–5.0)
Alkaline Phosphatase: 72 U/L (ref 38–126)
Anion gap: 15 (ref 5–15)
BUN: 25 mg/dL — ABNORMAL HIGH (ref 8–23)
CO2: 23 mmol/L (ref 22–32)
Calcium: 9 mg/dL (ref 8.9–10.3)
Chloride: 94 mmol/L — ABNORMAL LOW (ref 98–111)
Creatinine, Ser: 1.81 mg/dL — ABNORMAL HIGH (ref 0.61–1.24)
GFR, Estimated: 37 mL/min — ABNORMAL LOW (ref >60)
Glucose, Bld: 298 mg/dL — ABNORMAL HIGH (ref 70–99)
Potassium: 4.2 mmol/L (ref 3.5–5.1)
Sodium: 132 mmol/L — ABNORMAL LOW (ref 135–145)
Total Bilirubin: 1.1 mg/dL (ref 0.0–1.2)
Total Protein: 7.8 g/dL (ref 6.5–8.1)

## 2023-10-10 MED ORDER — SODIUM CHLORIDE 0.9 % IV SOLN
2.0000 g | Freq: Three times a day (TID) | INTRAVENOUS | Status: DC
  Administered 2023-10-10: 2 g via INTRAVENOUS
  Filled 2023-10-10: qty 10

## 2023-10-10 MED ORDER — ACETAMINOPHEN 500 MG PO TABS
1000.0000 mg | ORAL_TABLET | Freq: Once | ORAL | Status: AC
  Administered 2023-10-10: 1000 mg via ORAL

## 2023-10-10 NOTE — ED Provider Notes (Signed)
[facility] Provider Note    Event Date/Time   First MD Initiated Contact with Patient 10/10/23 2112     (approximate)   History   AMS   HPI  Christian [name] is a 83 y.o. male who presented to the emergency department today via EMS because of concerns for altered mental status, weakness and fever.  Patient states that he has history of recurrent UTIs.  Does have a history of bladder cancer and urostomy.  Patient states that he has had fevers throughout the day today.  Does have generalized weakness.  Denies any significant pain.     Physical Exam   Triage Vital Signs: ED Triage Vitals  Encounter Vitals Group     BP 10/10/23 2131 (!) 147/62     Systolic BP Percentile --      Diastolic BP Percentile --      Pulse Rate 10/10/23 2114 98     Resp 10/10/23 2135 (!) 23     Temp 10/10/23 2114 (!) 102.7 F (39.3 C)     Temp Source 10/10/23 2114 Oral     SpO2 10/10/23 2114 100 %     Weight 10/10/23 2127 251 lb (113.9 kg)     Height 10/10/23 2125 5\' 10"  (1.778 m)     Head Circumference --      Peak Flow --      Pain Score 10/10/23 2126 0     Pain Loc --      Pain Education --      Exclude from Growth Chart --     Most recent vital signs: Vitals:   10/10/23 2131 10/10/23 2135  BP: (!) 147/62   Pulse:  100  Resp:  (!) 23  Temp:    SpO2:  98%   General: Awake, alert, not completely oriented. CV:  Good peripheral perfusion. Regular rates and rhythm. Resp:  Normal effort. Lungs clear. Abd:  No distention. Non tender.   ED Results / Procedures / Treatments   Labs (all labs ordered are listed, but only abnormal results are displayed) Labs Reviewed  COMPREHENSIVE METABOLIC PANEL - Abnormal; Notable for the following components:      Result Value   Sodium 132 (*)    Chloride 94 (*)    Glucose, Bld 298 (*)    BUN 25 (*)    Creatinine, Ser 1.81 (*)    GFR, Estimated 37 (*)    All other components within normal limits  CBC WITH  DIFFERENTIAL/PLATELET - Abnormal; Notable for the following components:   WBC 11.4 (*)    RBC 3.79 (*)    HCT 38.8 (*)    MCV 102.4 (*)    MCH 34.3 (*)    Monocytes Absolute 1.8 (*)    Abs Immature Granulocytes 0.10 (*)    All other components within normal limits  URINALYSIS, W/ REFLEX TO CULTURE (INFECTION SUSPECTED) - Abnormal; Notable for the following components:   Color, Urine YELLOW (*)    APPearance HAZY (*)    Hgb urine dipstick MODERATE (*)    Protein, ur 30 (*)    Bacteria, UA RARE (*)    All other components within normal limits  CULTURE, BLOOD (ROUTINE X 2)  CULTURE, BLOOD (ROUTINE X 2)  LACTIC ACID, PLASMA  LACTIC ACID, PLASMA     RADIOLOGY I independently interpreted and visualized the CXR. My interpretation: No pneumonia Radiology interpretation:  IMPRESSION:  1. No active disease.      PROCEDURES:  Critical Care performed: Yes  CRITICAL CARE Performed by: Phineas Semen   Total critical care time: 35 minutes  Critical care time was exclusive of separately billable procedures and treating other patients.  Critical care was necessary to treat or prevent imminent or life-threatening deterioration.  Critical care was time spent personally by me on the following activities: development of treatment plan with patient and/or surrogate as well as nursing, discussions with consultants, evaluation of patient's response to treatment, examination of patient, obtaining history from patient or surrogate, ordering and performing treatments and interventions, ordering and review of laboratory studies, ordering and review of radiographic studies, pulse oximetry and re-evaluation of patient's condition.   Procedures    MEDICATIONS ORDERED IN ED: Medications  aztreonam (AZACTAM) 2 g in sodium chloride 0.9 % 100 mL IVPB (2 g Intravenous New Bag/Given 10/10/23 2135)  acetaminophen (TYLENOL) tablet 1,000 mg (1,000 mg Oral Given 10/10/23 2135)     IMPRESSION / MDM  / ASSESSMENT AND PLAN / ED COURSE  I reviewed the triage vital signs and the nursing notes.                              Differential diagnosis includes, but is not limited to, UTI, pneumonia, viral illness  Patient's presentation is most consistent with acute presentation with potential threat to life or bodily function.   The patient is on the cardiac monitor to evaluate for evidence of arrhythmia and/or significant heart rate changes.  Patient presented to the emergency department today because of concerns for fevers, altered mental status.  Patient apparently has history of frequent recurrent UTIs.  Patient was found to be febrile and tachycardic here in the emergency department.  UA is consistent with infection.  Slight leukocytosis.  Patient was given IV antibiotics here.  Patient is allergic to cephalosporins so was given aztreonam. Will plan on admission to the hospital. Will discuss with the hospitalist service.      FINAL CLINICAL IMPRESSION(S) / ED DIAGNOSES   Final diagnoses:  Altered mental status, unspecified altered mental status type  Lower urinary tract infectious disease        Note:  This document was prepared using Dragon voice recognition software and may include unintentional dictation errors.    Phineas Semen, MD 10/10/23 [phone]

## 2023-10-11 ENCOUNTER — Inpatient Hospital Stay: Payer: Medicare HMO

## 2023-10-11 DIAGNOSIS — E236 Other disorders of pituitary gland: Secondary | ICD-10-CM | POA: Diagnosis present

## 2023-10-11 DIAGNOSIS — Z794 Long term (current) use of insulin: Secondary | ICD-10-CM | POA: Diagnosis not present

## 2023-10-11 DIAGNOSIS — E785 Hyperlipidemia, unspecified: Secondary | ICD-10-CM | POA: Diagnosis present

## 2023-10-11 DIAGNOSIS — Z7901 Long term (current) use of anticoagulants: Secondary | ICD-10-CM | POA: Diagnosis not present

## 2023-10-11 DIAGNOSIS — E1065 Type 1 diabetes mellitus with hyperglycemia: Secondary | ICD-10-CM | POA: Diagnosis present

## 2023-10-11 DIAGNOSIS — G9341 Metabolic encephalopathy: Secondary | ICD-10-CM

## 2023-10-11 DIAGNOSIS — B974 Respiratory syncytial virus as the cause of diseases classified elsewhere: Secondary | ICD-10-CM | POA: Diagnosis present

## 2023-10-11 DIAGNOSIS — A419 Sepsis, unspecified organism: Secondary | ICD-10-CM | POA: Diagnosis present

## 2023-10-11 DIAGNOSIS — C772 Secondary and unspecified malignant neoplasm of intra-abdominal lymph nodes: Secondary | ICD-10-CM | POA: Diagnosis present

## 2023-10-11 DIAGNOSIS — N179 Acute kidney failure, unspecified: Secondary | ICD-10-CM | POA: Diagnosis present

## 2023-10-11 DIAGNOSIS — R6520 Severe sepsis without septic shock: Secondary | ICD-10-CM | POA: Diagnosis present

## 2023-10-11 DIAGNOSIS — B338 Other specified viral diseases: Secondary | ICD-10-CM

## 2023-10-11 DIAGNOSIS — R4182 Altered mental status, unspecified: Secondary | ICD-10-CM | POA: Diagnosis not present

## 2023-10-11 DIAGNOSIS — D849 Immunodeficiency, unspecified: Secondary | ICD-10-CM | POA: Diagnosis present

## 2023-10-11 DIAGNOSIS — N1831 Chronic kidney disease, stage 3a: Secondary | ICD-10-CM | POA: Diagnosis present

## 2023-10-11 DIAGNOSIS — I480 Paroxysmal atrial fibrillation: Secondary | ICD-10-CM | POA: Diagnosis present

## 2023-10-11 DIAGNOSIS — E039 Hypothyroidism, unspecified: Secondary | ICD-10-CM | POA: Diagnosis present

## 2023-10-11 DIAGNOSIS — E271 Primary adrenocortical insufficiency: Secondary | ICD-10-CM | POA: Diagnosis present

## 2023-10-11 DIAGNOSIS — E1022 Type 1 diabetes mellitus with diabetic chronic kidney disease: Secondary | ICD-10-CM | POA: Diagnosis present

## 2023-10-11 DIAGNOSIS — N390 Urinary tract infection, site not specified: Secondary | ICD-10-CM | POA: Diagnosis present

## 2023-10-11 DIAGNOSIS — I719 Aortic aneurysm of unspecified site, without rupture: Secondary | ICD-10-CM | POA: Diagnosis present

## 2023-10-11 DIAGNOSIS — Z1152 Encounter for screening for COVID-19: Secondary | ICD-10-CM | POA: Diagnosis not present

## 2023-10-11 DIAGNOSIS — I2510 Atherosclerotic heart disease of native coronary artery without angina pectoris: Secondary | ICD-10-CM | POA: Diagnosis present

## 2023-10-11 DIAGNOSIS — Z936 Other artificial openings of urinary tract status: Secondary | ICD-10-CM | POA: Diagnosis not present

## 2023-10-11 DIAGNOSIS — C679 Malignant neoplasm of bladder, unspecified: Secondary | ICD-10-CM | POA: Diagnosis present

## 2023-10-11 DIAGNOSIS — I129 Hypertensive chronic kidney disease with stage 1 through stage 4 chronic kidney disease, or unspecified chronic kidney disease: Secondary | ICD-10-CM | POA: Diagnosis present

## 2023-10-11 LAB — GLUCOSE, CAPILLARY
Glucose-Capillary: 293 mg/dL — ABNORMAL HIGH (ref 70–99)
Glucose-Capillary: 328 mg/dL — ABNORMAL HIGH (ref 70–99)

## 2023-10-11 LAB — BASIC METABOLIC PANEL
Anion gap: 11 (ref 5–15)
Anion gap: 11 (ref 5–15)
BUN: 28 mg/dL — ABNORMAL HIGH (ref 8–23)
BUN: 29 mg/dL — ABNORMAL HIGH (ref 8–23)
CO2: 21 mmol/L — ABNORMAL LOW (ref 22–32)
CO2: 20 mmol/L — ABNORMAL LOW (ref 22–32)
Calcium: 8.3 mg/dL — ABNORMAL LOW (ref 8.9–10.3)
Calcium: 8.4 mg/dL — ABNORMAL LOW (ref 8.9–10.3)
Chloride: 97 mmol/L — ABNORMAL LOW (ref 98–111)
Chloride: 100 mmol/L (ref 98–111)
Creatinine, Ser: 2.11 mg/dL — ABNORMAL HIGH (ref 0.61–1.24)
Creatinine, Ser: 1.66 mg/dL — ABNORMAL HIGH (ref 0.61–1.24)
GFR, Estimated: 31 mL/min — ABNORMAL LOW (ref >60)
GFR, Estimated: 41 mL/min — ABNORMAL LOW (ref >60)
Glucose, Bld: 334 mg/dL — ABNORMAL HIGH (ref 70–99)
Glucose, Bld: 328 mg/dL — ABNORMAL HIGH (ref 70–99)
Potassium: 4.1 mmol/L (ref 3.5–5.1)
Potassium: 4.1 mmol/L (ref 3.5–5.1)
Sodium: 129 mmol/L — ABNORMAL LOW (ref 135–145)
Sodium: 131 mmol/L — ABNORMAL LOW (ref 135–145)

## 2023-10-11 LAB — URINALYSIS, ROUTINE W REFLEX MICROSCOPIC
Bilirubin Urine: NEGATIVE
Glucose, UA: 150 mg/dL — AB
Ketones, ur: 5 mg/dL — AB
Nitrite: POSITIVE — AB
Protein, ur: 30 mg/dL — AB
Specific Gravity, Urine: 1.015 (ref 1.005–1.030)
pH: 5 (ref 5.0–8.0)

## 2023-10-11 LAB — RESP PANEL BY RT-PCR (RSV, FLU A&B, COVID)  RVPGX2
Influenza A by PCR: NEGATIVE
Influenza B by PCR: NEGATIVE
Resp Syncytial Virus by PCR: POSITIVE — AB
SARS Coronavirus 2 by RT PCR: NEGATIVE

## 2023-10-11 LAB — CBG MONITORING, ED
Glucose-Capillary: 343 mg/dL — ABNORMAL HIGH (ref 70–99)
Glucose-Capillary: 328 mg/dL — ABNORMAL HIGH (ref 70–99)
Glucose-Capillary: 341 mg/dL — ABNORMAL HIGH (ref 70–99)
Glucose-Capillary: 288 mg/dL — ABNORMAL HIGH (ref 70–99)

## 2023-10-11 LAB — HEMOGLOBIN A1C
Hgb A1c MFr Bld: 8.6 % — ABNORMAL HIGH (ref 4.8–5.6)
Mean Plasma Glucose: 200.12 mg/dL

## 2023-10-11 LAB — CORTISOL-AM, BLOOD: Cortisol - AM: 1.9 ug/dL — ABNORMAL LOW (ref 6.7–22.6)

## 2023-10-11 LAB — PROTIME-INR
INR: 1.2 (ref 0.8–1.2)
Prothrombin Time: 15.7 s — ABNORMAL HIGH (ref 11.4–15.2)

## 2023-10-11 LAB — PROCALCITONIN: Procalcitonin: 1.91 ng/mL

## 2023-10-11 LAB — VANCOMYCIN, RANDOM: Vancomycin Rm: 10 ug/mL

## 2023-10-11 LAB — MRSA NEXT GEN BY PCR, NASAL: MRSA by PCR Next Gen: NOT DETECTED

## 2023-10-11 MED ORDER — INSULIN GLARGINE-YFGN 100 UNIT/ML ~~LOC~~ SOLN
20.0000 [IU] | Freq: Every day | SUBCUTANEOUS | Status: DC
  Administered 2023-10-11 – 2023-10-12 (×3): 20 [IU] via SUBCUTANEOUS
  Filled 2023-10-11 (×5): qty 0.2

## 2023-10-11 MED ORDER — MIDODRINE HCL 5 MG PO TABS
10.0000 mg | ORAL_TABLET | Freq: Three times a day (TID) | ORAL | Status: DC
  Administered 2023-10-11 (×3): 10 mg via ORAL
  Filled 2023-10-11 (×4): qty 2

## 2023-10-11 MED ORDER — ONDANSETRON HCL 4 MG PO TABS
4.0000 mg | ORAL_TABLET | Freq: Four times a day (QID) | ORAL | Status: DC | PRN
Start: 2023-10-11 — End: 2023-10-13

## 2023-10-11 MED ORDER — VANCOMYCIN HCL 2000 MG/400ML IV SOLN
2000.0000 mg | Freq: Once | INTRAVENOUS | Status: AC
  Administered 2023-10-11: 2000 mg via INTRAVENOUS
  Filled 2023-10-11: qty 400

## 2023-10-11 MED ORDER — INSULIN ASPART 100 UNIT/ML IJ SOLN
0.0000 [IU] | Freq: Every day | INTRAMUSCULAR | Status: DC
  Administered 2023-10-11 – 2023-10-12 (×3): 4 [IU] via SUBCUTANEOUS
  Filled 2023-10-11 (×3): qty 1

## 2023-10-11 MED ORDER — VANCOMYCIN HCL IN DEXTROSE 1-5 GM/200ML-% IV SOLN: 1000.0000 mg | Freq: Once | INTRAVENOUS | Status: DC

## 2023-10-11 MED ORDER — ATORVASTATIN CALCIUM 10 MG PO TABS
10.0000 mg | ORAL_TABLET | Freq: Every day | ORAL | Status: DC
  Administered 2023-10-11 – 2023-10-12 (×2): 10 mg via ORAL
  Filled 2023-10-11 (×2): qty 1

## 2023-10-11 MED ORDER — APIXABAN 5 MG PO TABS
5.0000 mg | ORAL_TABLET | Freq: Two times a day (BID) | ORAL | Status: DC
  Administered 2023-10-11 – 2023-10-13 (×6): 5 mg via ORAL
  Filled 2023-10-11 (×6): qty 1

## 2023-10-11 MED ORDER — LEVOTHYROXINE SODIUM 112 MCG PO TABS
112.0000 ug | ORAL_TABLET | Freq: Every day | ORAL | Status: DC
  Administered 2023-10-11 – 2023-10-13 (×3): 112 ug via ORAL
  Filled 2023-10-11 (×3): qty 1

## 2023-10-11 MED ORDER — NITROGLYCERIN 0.4 MG SL SUBL: 0.4000 mg | SUBLINGUAL_TABLET | SUBLINGUAL | Status: DC | PRN

## 2023-10-11 MED ORDER — ACETAMINOPHEN 650 MG RE SUPP: 650.0000 mg | Freq: Four times a day (QID) | RECTAL | Status: DC | PRN

## 2023-10-11 MED ORDER — SODIUM CHLORIDE 0.9 % IV SOLN: INTRAVENOUS | Status: AC

## 2023-10-11 MED ORDER — ACETAMINOPHEN 325 MG PO TABS: 650.0000 mg | ORAL_TABLET | Freq: Four times a day (QID) | ORAL | Status: DC | PRN

## 2023-10-11 MED ORDER — VANCOMYCIN HCL IN DEXTROSE 1-5 GM/200ML-% IV SOLN
1000.0000 mg | INTRAVENOUS | Status: DC
  Administered 2023-10-12 – 2023-10-13 (×2): 1000 mg via INTRAVENOUS
  Filled 2023-10-11 (×2): qty 200

## 2023-10-11 MED ORDER — INSULIN ASPART 100 UNIT/ML IJ SOLN
0.0000 [IU] | Freq: Three times a day (TID) | INTRAMUSCULAR | Status: DC
  Administered 2023-10-11 (×2): 11 [IU] via SUBCUTANEOUS
  Administered 2023-10-11 – 2023-10-12 (×2): 15 [IU] via SUBCUTANEOUS
  Administered 2023-10-12: 11 [IU] via SUBCUTANEOUS
  Administered 2023-10-12 – 2023-10-13 (×2): 20 [IU] via SUBCUTANEOUS
  Administered 2023-10-13: 11 [IU] via SUBCUTANEOUS
  Filled 2023-10-11 (×8): qty 1

## 2023-10-11 MED ORDER — HYDROCORTISONE SOD SUC (PF) 100 MG IJ SOLR
100.0000 mg | Freq: Four times a day (QID) | INTRAMUSCULAR | Status: DC
  Administered 2023-10-11 – 2023-10-13 (×10): 100 mg via INTRAVENOUS
  Filled 2023-10-11 (×13): qty 2

## 2023-10-11 MED ORDER — ONDANSETRON HCL 4 MG/2ML IJ SOLN
4.0000 mg | Freq: Four times a day (QID) | INTRAMUSCULAR | Status: DC | PRN
  Administered 2023-10-11: 4 mg via INTRAVENOUS
  Filled 2023-10-11: qty 2

## 2023-10-11 MED ORDER — LEVOTHYROXINE SODIUM 112 MCG PO TABS: 112.0000 ug | ORAL_TABLET | Freq: Every day | ORAL | Status: DC

## 2023-10-11 MED ORDER — SODIUM CHLORIDE 0.9 % IV SOLN
1.0000 g | Freq: Two times a day (BID) | INTRAVENOUS | Status: DC
  Administered 2023-10-11 – 2023-10-13 (×6): 1 g via INTRAVENOUS
  Filled 2023-10-11 (×6): qty 20

## 2023-10-11 MED ORDER — LACTATED RINGERS IV SOLN
150.0000 mL/h | INTRAVENOUS | Status: AC
  Administered 2023-10-11: 150 mL/h via INTRAVENOUS

## 2023-10-11 NOTE — Assessment & Plan Note (Signed)
Primary hypothyroidism Immune hypophysitis/primary adrenal insufficiency Stress dose steroids-hydrocortisone 100 mg every 6 Continue levothyroxine

## 2023-10-11 NOTE — Assessment & Plan Note (Signed)
 Holding antihypertensives due to soft blood pressure.

## 2023-10-11 NOTE — Assessment & Plan Note (Signed)
Secondary to sepsis Neurologic checks with fall and aspiration precautions

## 2023-10-11 NOTE — Plan of Care (Signed)

## 2023-10-11 NOTE — Assessment & Plan Note (Signed)
No acute issues suspected Continue atorvastatin and apixaban.  Holding metoprolol

## 2023-10-11 NOTE — Assessment & Plan Note (Signed)
Expecting improvement to baseline with IV fluid resuscitation

## 2023-10-11 NOTE — Assessment & Plan Note (Signed)
Immune related type 1 diabetes Sliding scale insulin coverage

## 2023-10-11 NOTE — H&P (Signed)
History and Physical    Patient: Christian [name] ZOX:[id number] DOB: 1940-12-07 DOA: 10/10/2023 DOS: the patient was seen and examined on 10/11/2023 PCP: Ethelda Chick, MD  Patient coming from: Home  Chief Complaint:  Chief Complaint  Patient presents with   Altered Mental Status    BIBA:  EMS was called out to the house this morning for the same but no fever and not as altered.  Wife called EMS due to weakness and altered mental status, not eating much and fever.       HPI: Christian [name] is a 83 y.o. male with medical history significant for Metastatic bladder cancer s/p urostomy on pembrolizumab , complicated by immune related type 1 diabetes, immune hypophysitis on hydrocortisone and primary hypothyroidism on levothyroxine as well as history of A-fib on Eliquis, hypertension and frequent UTIs who was brought to the ED for evaluation of altered mental status, generalized malaise, weakness and fever.  Wife at bedside gives history and states that at baseline patient is very astute however he appeared disoriented.  She also reports decreased oral intake and also had a cough. ED course and data review: Febrile to 102.7, tachycardic to 103 Labs notable for WBC 11,400 with lactic acid 1.3 Urinalysis unremarkable Blood glucose 298 with creatinine 1.81 up from baseline of 1.5 07/2023 Chest x-ray nonacute Respiratory viral panel not done Patient started on aztreonam Hospitalist consulted for admission.     Past Medical History:  Diagnosis Date   Aortic aneurysm (HCC)    followed by vascular surgery yearly.   Bladder cancer [facility]    s/p bladder resection. [place]   Coronary artery disease    AMI anterior wall 1997; s/p PTCA to LAD 11/1995 [facility].   Diabetes mellitus without complication (HCC)    DVT (deep venous thrombosis) (HCC) 10/07/2007   Glucose intolerance (impaired glucose tolerance)    Hydrocele    Hyperlipidemia    Hypertension    MI (myocardial infarction) (HCC) 08/27/1995    Nephrolithiasis    Sleep apnea    Past Surgical History:  Procedure Laterality Date   ANGIOPLASTY  1997   [place]   bladder removed     CARDIAC CATHETERIZATION     COLONOSCOPY  08/27/2011   normal.  [facility].   HYDROCELE EXCISION     PROSTATE SURGERY     prostatectomy with bladder resection.   Social History:  reports that he has never smoked. He has never used smokeless tobacco. He reports that he does not drink alcohol and does not use drugs.  Allergies  Allergen Reactions   Ceftriaxone Hives    Tolerated cefazolin 09/2021 without reaction   Other Hives   Sulfasalazine Hives   Sulfa Antibiotics Hives    Family History  Problem Relation Age of Onset   Stroke Mother    Diabetes Mother    Lung cancer Father    Cancer Father        lung cancer   Diabetes Sister    Diabetes Brother     Prior to Admission medications   Medication Sig Start Date End Date Taking? Authorizing Provider  apixaban (ELIQUIS) 5 MG TABS tablet Take 1 tablet (5 mg total) by mouth 2 (two) times daily. 08/17/18   Antonieta Iba, MD  atorvastatin (LIPITOR) 10 MG tablet Take 1 tablet (10 mg total) by mouth daily at 6 PM. 02/23/18   Ethelda Chick, MD  Continuous Glucose Receiver (DEXCOM G7 RECEIVER) [facility] See admin instructions.  [provider]  Continuous Glucose Sensor (DEXCOM G7 SENSOR) MISC USE 1 EACH EVERY 10 DAYS    [provider]  fluticasone (FLONASE) 50 MCG/ACT nasal spray Place 2 sprays into both nostrils as needed. 04/19/19   [provider]  gabapentin (NEURONTIN) 300 MG capsule Take 600 mg by mouth 2 (two) times daily. 07/23/21   [provider]  hydrocortisone (CORTEF) 10 MG tablet Take 20 mg in the morning and 10 mg at night 02/20/23   [provider]  Insulin Aspart FlexPen (NOVOLOG) 100 UNIT/ML Inject 25-40 units subcutaneously before meals three times daily as directed. Max Total Daily Dose 130 units per day. 02/17/23   [provider]  insulin degludec (TRESIBA) 100 UNIT/ML FlexTouch Pen Inject into the skin. 05/22/23   [provider]  Lancets (ONETOUCH DELICA PLUS [place]) MISC Apply 1 each topically 3 (three) times daily. 09/30/22   [provider]  LANTUS SOLOSTAR 100 UNIT/ML Solostar Pen Inject 48 Units into the skin at bedtime. 10/07/22   [provider]  levothyroxine (SYNTHROID) 112 MCG tablet Take 112 mcg by mouth daily before breakfast. 02/20/23 02/20/24  [provider]  metFORMIN (GLUCOPHAGE-XR) 500 MG 24 hr tablet Take 500 mg by mouth daily with supper. 09/30/22 09/30/23  [provider]  metoprolol succinate (TOPROL-XL) 25 MG 24 hr tablet TAKE 0.5 TABLETS BY MOUTH DAILY. TAKE WITH OR IMMEDIATELY FOLLOWING A MEAL. 08/13/23   Antonieta Iba, MD  midodrine (PROAMATINE) 10 MG tablet Take 1 tablet (10 mg total) by mouth 3 (three) times daily. 02/21/23   Antonieta Iba, MD  nitroGLYCERIN (NITROSTAT) 0.4 MG SL tablet Place 1 tablet (0.4 mg total) under the tongue every 5 (five) minutes as needed for chest pain. 02/23/18   Ethelda Chick, MD  [facility] ULTRA test strip 1 each (1 strip total) 3 (three) times daily Use as instructed. Dx code: E11.9 09/30/22   [provider]    Physical Exam: Vitals:   10/10/23 2322 10/10/23 2325 10/11/23 0000 10/11/23 0008  BP: 99/63  102/61   Pulse: (!) 105  (!) 103   Resp: (!) 28  17   Temp:  (!) 102.3 F (39.1 C)  (!) 101.1 F (38.4 C)  TempSrc:  Oral  Oral  SpO2: 96%  94%   Weight:      Height:       Physical Exam Vitals and nursing note reviewed.  Constitutional:      General: He is not in acute distress. HENT:     Head: Normocephalic and atraumatic.  Cardiovascular:     Rate and Rhythm: Regular rhythm. Tachycardia present.     Heart sounds: Normal heart sounds.  Pulmonary:     Effort: Pulmonary effort is normal.     Breath sounds: Normal breath sounds.  Abdominal:     Palpations: Abdomen is soft.      Tenderness: There is no abdominal tenderness.     Comments: Urostomy  Neurological:     General: No focal deficit present.     Labs on Admission: I have personally reviewed following labs and imaging studies  CBC: Recent Labs  Lab 10/10/23 2126  WBC 11.4*  NEUTROABS 7.2  HGB 13.0  HCT 38.8*  MCV 102.4*  PLT 194   Basic Metabolic Panel: Recent Labs  Lab 10/10/23 2126  NA 132*  K 4.2  CL 94*  CO2 23  GLUCOSE 298*  BUN 25*  CREATININE 1.81*  CALCIUM 9.0  GFR: Estimated Creatinine Clearance: 41.2 mL/min (A) (by C-G formula based on SCr of 1.81 mg/dL (H)). Liver Function Tests: Recent Labs  Lab 10/10/23 2126  AST 16  ALT 10  ALKPHOS 72  BILITOT 1.1  PROT 7.8  ALBUMIN 4.0   No results for input(s): "LIPASE", "AMYLASE" in the last 168 hours. No results for input(s): "AMMONIA" in the last 168 hours. Coagulation Profile: No results for input(s): "INR", "PROTIME" in the last 168 hours. Cardiac Enzymes: No results for input(s): "CKTOTAL", "CKMB", "CKMBINDEX", "TROPONINI" in the last 168 hours. BNP (last 3 results) No results for input(s): "PROBNP" in the last 8760 hours. HbA1C: No results for input(s): "HGBA1C" in the last 72 hours. CBG: No results for input(s): "GLUCAP" in the last 168 hours. Lipid Profile: No results for input(s): "CHOL", "HDL", "LDLCALC", "TRIG", "CHOLHDL", "LDLDIRECT" in the last 72 hours. Thyroid Function Tests: No results for input(s): "TSH", "T4TOTAL", "FREET4", "T3FREE", "THYROIDAB" in the last 72 hours. Anemia Panel: No results for input(s): "VITAMINB12", "FOLATE", "FERRITIN", "TIBC", "IRON", "RETICCTPCT" in the last 72 hours. Urine analysis:    Component Value Date/Time   COLORURINE YELLOW (A) 10/10/2023 2126   APPEARANCEUR HAZY (A) 10/10/2023 2126   LABSPEC 1.012 10/10/2023 2126   PHURINE 5.0 10/10/2023 2126   GLUCOSEU NEGATIVE 10/10/2023 2126   HGBUR MODERATE (A) 10/10/2023 2126   BILIRUBINUR NEGATIVE 10/10/2023 2126    BILIRUBINUR negative 02/23/2018 1048   BILIRUBINUR neg 03/01/2015 1132   KETONESUR NEGATIVE 10/10/2023 2126   PROTEINUR 30 (A) 10/10/2023 2126   UROBILINOGEN 0.2 02/23/2018 1048   NITRITE NEGATIVE 10/10/2023 2126   LEUKOCYTESUR NEGATIVE 10/10/2023 2126    Radiological Exams on Admission: DG Chest Port 1 View Result Date: 10/10/2023 CLINICAL DATA:  Sepsis EXAM: PORTABLE CHEST 1 VIEW COMPARISON:  11/17/2017 FINDINGS: Minimal left basilar scarring. Lungs are otherwise clear. No pneumothorax or pleural effusion. Right internal jugular chest port tip is seen within the right atrium. Cardiac size within normal limits. Pulmonary vascularity is. No acute bone abnormality. IMPRESSION: 1. No active disease. Electronically Signed   By: Helyn Numbers M.D.   On: 10/10/2023 22:28     Data Reviewed: Relevant notes from primary care and specialist visits, past discharge summaries as available in EHR, including Care Everywhere. Prior diagnostic testing as pertinent to current admission diagnoses Updated medications and problem lists for reconciliation ED course, including vitals, labs, imaging, treatment and response to treatment Triage notes, nursing and pharmacy notes and ED provider's notes Notable results as noted in HPI   Assessment and Plan: * Sepsis (HCC) History of frequent UTIs Urinalysis not consistent with UTI.  Chest x-ray clear Will get respiratory viral panel Will get CT abdomen and pelvis---> no acute findings In the interim we will treat for sepsis of unknown source Sepsis fluids Broad-spectrum antibiotics, meropenem and vancomycin  Respiratory syncytial virus (RSV) infection Addendum: Following admission respiratory viral panel was ordered returning positive for RSV Given immunosuppressed status, will continue antibiotics Procalcitonin ordered plan to de-escalate antibiotics if less than 0.10 Antitussives, incentive spirometer, albuterol as needed and supplemental oxygen if  needed to keep sats over 94%  Acute metabolic encephalopathy Secondary to sepsis Neurologic checks with fall and aspiration precautions  Acute renal failure superimposed on stage 3a chronic kidney disease (HCC) Expecting improvement to baseline with IV fluid resuscitation  Paroxysmal atrial fibrillation (HCC) Chronic anticoagulation Continue apixaban Hold Eliquis due to soft blood pressure  Uncontrolled type 1 diabetes mellitus with hyperglycemia, with long-term current use of insulin (HCC) Immune  related type 1 diabetes Sliding scale insulin coverage  Adrenal insufficiency due to cancer therapy [facility]) Primary hypothyroidism Immune hypophysitis/primary adrenal insufficiency Stress dose steroids-hydrocortisone 100 mg every 6 Continue levothyroxine  Bladder cancer metastasized to intra-abdominal lymph nodes (HCC) Urostomy status Followed at [facility].  On pembrolizumab  Essential hypertension Holding antihypertensives due to soft blood pressure  Coronary artery disease No acute issues suspected Continue atorvastatin and apixaban.  Holding metoprolol        DVT prophylaxis: eliquis  Consults: none  Advance Care Planning:   Code Status: Full Code   Family Communication: Wife at bedside  Disposition Plan: Back to previous home environment  Severity of Illness: The appropriate patient status for this patient is INPATIENT. Inpatient status is judged to be reasonable and necessary in order to provide the required intensity of service to ensure the patient's safety. The patient's presenting symptoms, physical exam findings, and initial radiographic and laboratory data in the context of their chronic comorbidities is felt to place them at high risk for further clinical deterioration. Furthermore, it is not anticipated that the patient will be medically stable for discharge from the hospital within 2 midnights of admission.   * I certify that at the point of admission it is my  clinical judgment that the patient will require inpatient hospital care spanning beyond 2 midnights from the point of admission due to high intensity of service, high risk for further deterioration and high frequency of surveillance required.*  Author: Andris Baumann, MD 10/11/2023 12:45 AM  For on call review [email or web address].

## 2023-10-11 NOTE — Plan of Care (Signed)
Patient seen and examined during the morning rounds.  Had remarkable improvement in symptoms.  No overnight events.  Continues to stay on antibiotics for UTI on broad-spectrum secondary to being immunocompromise.  And symptomatic management of RSV infection.  Rest of the plan as per HPI

## 2023-10-11 NOTE — Consult Note (Signed)
Pharmacy Antibiotic Note  Christian [name] is a 83 y.o. male admitted on 10/10/2023 with  concerns for altered mental status .  Pharmacy has been consulted for Vancomycin and Meropenem dosing.  Plan: Vancomycin 2000 mg IV x 1 as loading dose - due to patient's AKI, will defer scheduled dosing and dose per random levels - Will check random vancomycin level on 2/15@2200   Meropenem 1g IV Q12 hours  Will continue to monitor renal function and culture results and adjust dosing as appropriate   Height: 5\' 10"  (177.8 cm) Weight: 113.9 kg (251 lb) IBW/kg (Calculated) : 73  Temp (24hrs), Avg:102.3 F (39.1 C), Min:101.1 F (38.4 C), Max:103.1 F (39.5 C)  Recent Labs  Lab 10/10/23 2126 10/10/23 2322  WBC 11.4*  --   CREATININE 1.81*  --   LATICACIDVEN 1.8 1.3    Estimated Creatinine Clearance: 41.2 mL/min (A) (by C-G formula based on SCr of 1.81 mg/dL (H)).    Allergies  Allergen Reactions   Ceftriaxone Hives    Tolerated cefazolin 09/2021 without reaction   Other Hives   Sulfasalazine Hives   Sulfa Antibiotics Hives    Antimicrobials this admission: Vancomycin 2/15 >>  Meropenem 2/15 >>  Aztreonam 2/14 x 1  Dose adjustments this admission: N/A  Microbiology results: 2/14 BCx: collected 2/15 MRSA PCR: ordered  Thank you for allowing pharmacy to be a part of this patient's care.  Christian [name] A Christian [name] 10/11/2023 2:35 AM

## 2023-10-11 NOTE — Assessment & Plan Note (Addendum)
Addendum: Following admission respiratory viral panel was ordered returning positive for RSV Given immunosuppressed status, will continue antibiotics Procalcitonin ordered plan to de-escalate antibiotics if less than 0.10 Antitussives, incentive spirometer, albuterol as needed and supplemental oxygen if needed to keep sats over 94%

## 2023-10-11 NOTE — Assessment & Plan Note (Addendum)
History of frequent UTIs Urinalysis not consistent with UTI.  Chest x-ray clear Will get respiratory viral panel Will get CT abdomen and pelvis---> no acute findings In the interim we will treat for sepsis of unknown source Sepsis fluids Broad-spectrum antibiotics, meropenem and vancomycin

## 2023-10-11 NOTE — Assessment & Plan Note (Signed)
Chronic anticoagulation Continue apixaban Hold Eliquis due to soft blood pressure

## 2023-10-11 NOTE — Assessment & Plan Note (Addendum)
Urostomy status Followed at [facility].  On pembrolizumab

## 2023-10-12 DIAGNOSIS — A419 Sepsis, unspecified organism: Secondary | ICD-10-CM

## 2023-10-12 LAB — CBC
HCT: 31.9 % — ABNORMAL LOW (ref 39.0–52.0)
Hemoglobin: 11 g/dL — ABNORMAL LOW (ref 13.0–17.0)
MCH: 34.3 pg — ABNORMAL HIGH (ref 26.0–34.0)
MCHC: 34.5 g/dL (ref 30.0–36.0)
MCV: 99.4 fL (ref 80.0–100.0)
Platelets: 184 10*3/uL (ref 150–400)
RBC: 3.21 MIL/uL — ABNORMAL LOW (ref 4.22–5.81)
RDW: 13.1 % (ref 11.5–15.5)
WBC: 10 10*3/uL (ref 4.0–10.5)
nRBC: 0 % (ref 0.0–0.2)

## 2023-10-12 LAB — COMPREHENSIVE METABOLIC PANEL
ALT: 7 U/L (ref 0–44)
AST: 17 U/L (ref 15–41)
Albumin: 3.1 g/dL — ABNORMAL LOW (ref 3.5–5.0)
Alkaline Phosphatase: 57 U/L (ref 38–126)
Anion gap: 12 (ref 5–15)
BUN: 30 mg/dL — ABNORMAL HIGH (ref 8–23)
CO2: 19 mmol/L — ABNORMAL LOW (ref 22–32)
Calcium: 8.4 mg/dL — ABNORMAL LOW (ref 8.9–10.3)
Chloride: 100 mmol/L (ref 98–111)
Creatinine, Ser: 1.64 mg/dL — ABNORMAL HIGH (ref 0.61–1.24)
GFR, Estimated: 42 mL/min — ABNORMAL LOW (ref >60)
Glucose, Bld: 368 mg/dL — ABNORMAL HIGH (ref 70–99)
Potassium: 4.3 mmol/L (ref 3.5–5.1)
Sodium: 131 mmol/L — ABNORMAL LOW (ref 135–145)
Total Bilirubin: 1.1 mg/dL (ref 0.0–1.2)
Total Protein: 6.6 g/dL (ref 6.5–8.1)

## 2023-10-12 LAB — GLUCOSE, CAPILLARY
Glucose-Capillary: 392 mg/dL — ABNORMAL HIGH (ref 70–99)
Glucose-Capillary: 278 mg/dL — ABNORMAL HIGH (ref 70–99)
Glucose-Capillary: 333 mg/dL — ABNORMAL HIGH (ref 70–99)
Glucose-Capillary: 329 mg/dL — ABNORMAL HIGH (ref 70–99)

## 2023-10-12 LAB — MAGNESIUM: Magnesium: 2.1 mg/dL (ref 1.7–2.4)

## 2023-10-12 MED ORDER — GUAIFENESIN-DM 100-10 MG/5ML PO SYRP
10.0000 mL | ORAL_SOLUTION | Freq: Once | ORAL | Status: AC
  Administered 2023-10-12: 10 mL via ORAL
  Filled 2023-10-12: qty 10

## 2023-10-12 NOTE — Progress Notes (Signed)
Progress Note   Patient: Christian [name] ZOX:[id number] DOB: [birth year] DOA: 10/10/2023     1 DOS: the patient was seen and examined on 10/12/2023   Brief hospital course:  83 y.o. male with medical history significant for Metastatic bladder cancer s/p urostomy on pembrolizumab , complicated by immune related type 1 diabetes, immune hypophysitis on hydrocortisone and primary hypothyroidism on levothyroxine as well as history of A-fib on Eliquis, hypertension and frequent UTIs who was brought to the ED for evaluation of altered mental status, generalized malaise, weakness and fever.  Wife at bedside gives history and states that at baseline patient is very astute however he appeared disoriented.  She also reports decreased oral intake and also had a cough. ED course and data review: Febrile to 102.7, tachycardic to 103 Labs notable for WBC 11,400 with lactic acid 1.3  Blood glucose 298 with creatinine 1.81 up from baseline of 1.5 07/2023 Chest x-ray nonacute Respiratory viral panel showingg RSV  Patient started on aztreonam later switched to Meropenam   Assessment and Plan: Sepsis  / History of frequent UTIs  Urinalysis not consistent with UTI.  Chest x-ray clear respiratory viral panel + RSV  CT abdomen and pelvis---> no acute findings In the interim we will treat for sepsis of unknown source Sepsis fluids Broad-spectrum antibiotics, meropenem and vancomycin  Respiratory syncytial virus (RSV) infection  Following admission respiratory viral panel was ordered returning positive for RSV Given immunosuppressed status, will continue antibiotics Procalcitonin ordered plan to de-escalate antibiotics if less than 0.10 Antitussives, incentive spirometer, albuterol as needed and supplemental oxygen if needed to keep sats over 94%  Acute metabolic encephalopathy Secondary to sepsis Neurologic checks with fall and aspiration precautions  Acute renal failure superimposed on stage 3a chronic  kidney disease  Expecting improvement to baseline with IV fluid resuscitation  Paroxysmal atrial fibrillation (HCC) Chronic anticoagulation Continue apixaban Hold Eliquis due to soft blood pressure  Uncontrolled type 1 diabetes mellitus with hyperglycemia, with long-term current use of insulin Immune related type 1 diabetes Sliding scale insulin coverage  Adrenal insufficiency due to cancer therapy  Primary hypothyroidism Immune hypophysitis/primary adrenal insufficiency Stress dose steroids-hydrocortisone 100 mg every 6 Continue levothyroxine  Bladder cancer metastasized to intra-abdominal lymph nodes  Urostomy status Followed at [facility]  On pembrolizumab  Essential hypertension Holding antihypertensives due to soft blood pressure  Coronary artery disease No acute issues suspected Continue atorvastatin and apixaban.  Holding metoprolol      Subjective: Patient seen and examined this morning.  No overnight events.  Feeling better wants to go home.  Physical Exam: Vitals:   10/11/23 1400 10/11/23 1621 10/12/23 0111 10/12/23 0723  BP: (!) 104/59 118/83 (!) 118/50 139/67  Pulse: 73 77 72 72  Resp: 16 16 16 18   Temp: 98.4 F (36.9 C) 98.3 F (36.8 C) 98.9 F (37.2 C) 98.4 F (36.9 C)  TempSrc: Oral Oral Oral   SpO2: 94% 97% 94% 92%  Weight:      Height:       Physical Exam Constitutional:      Appearance: Normal appearance. He is obese.  HENT:     Mouth/Throat:     Mouth: Mucous membranes are dry.  Eyes:     Extraocular Movements: Extraocular movements intact.     Pupils: Pupils are equal, round, and reactive to light.  Cardiovascular:     Rate and Rhythm: Normal rate.  Pulmonary:     Effort: Pulmonary effort is normal.     Breath sounds:  Normal breath sounds.  Abdominal:     General: Bowel sounds are normal.     Palpations: Abdomen is soft.  Musculoskeletal:        General: Normal range of motion.     Cervical back: Normal range of motion.  Skin:     General: Skin is warm.  Neurological:     General: No focal deficit present.     Mental Status: He is alert and oriented to person, place, and time.  Psychiatric:        Mood and Affect: Mood normal.        Behavior: Behavior normal.        Thought Content: Thought content normal.     Data Reviewed:  There are no new results to review at this time.  Family Communication: None by bedside   Disposition: Status is: Inpatient Remains inpatient appropriate because: UTI/sepsis   Planned Discharge Destination: Home    Time spent: 35 minutes  Author: Kirstie Peri, MD 10/12/2023 2:46 PM  For on call review [email or web address].

## 2023-10-12 NOTE — Plan of Care (Signed)
  Problem: Metabolic: Goal: Ability to maintain appropriate glucose levels will improve Outcome: Progressing   Problem: Nutritional: Goal: Maintenance of adequate nutrition will improve Outcome: Progressing   Problem: Clinical Measurements: Goal: Signs and symptoms of infection will decrease Outcome: Progressing   Problem: Respiratory: Goal: Ability to maintain adequate ventilation will improve Outcome: Progressing

## 2023-10-12 NOTE — Consult Note (Signed)
Pharmacy Antibiotic Note  Christian [name] is a 83 y.o. male admitted on 10/10/2023 with  concerns for altered mental status .  Pharmacy has been consulted for Vancomycin and Meropenem dosing.  Plan: 2/15@2219  Vanc trough: 10 Patient's AKI resolving(although slowly), and clearing medication, therefore will transition to AUC dosing. Vancomycin 1000 mg IV Q 24 hrs. Goal AUC 400-550. Expected AUC: 504.3 Expected Cmin: 13.9 SCr used: 1.66, Vd used: 0.5   Meropenem 1g IV Q12 hours  Will continue to monitor renal function and culture results and adjust dosing as appropriate   Height: 5\' 10"  (177.8 cm) Weight: 113.9 kg (251 lb) IBW/kg (Calculated) : 73  Temp (24hrs), Avg:98.9 F (37.2 C), Min:98 F (36.7 C), Max:101.1 F (38.4 C)  Recent Labs  Lab 10/10/23 2126 10/10/23 2322 10/11/23 0345 10/11/23 2219  WBC 11.4*  --   --   --   CREATININE 1.81*  --  2.11* 1.66*  LATICACIDVEN 1.8 1.3  --   --   VANCORANDOM  --   --   --  10    Estimated Creatinine Clearance: 44.9 mL/min (A) (by C-G formula based on SCr of 1.66 mg/dL (H)).    Allergies  Allergen Reactions   Ceftriaxone Hives    Tolerated cefazolin 09/2021 without reaction   Other Hives   Sulfasalazine Hives   Sulfa Antibiotics Hives    Antimicrobials this admission: Vancomycin 2/15 >>  Meropenem 2/15 >>  Aztreonam 2/14 x 1  Dose adjustments this admission: N/A  Microbiology results: 2/14 BCx: NGTD 2/15 MRSA PCR: negative  Thank you for allowing pharmacy to be a part of this patient's care.  Azzie Thiem A Jisella Ashenfelter 10/12/2023 12:01 AM

## 2023-10-13 DIAGNOSIS — A419 Sepsis, unspecified organism: Secondary | ICD-10-CM | POA: Diagnosis not present

## 2023-10-13 LAB — COMPREHENSIVE METABOLIC PANEL
ALT: 9 U/L (ref 0–44)
AST: 13 U/L — ABNORMAL LOW (ref 15–41)
Albumin: 3.1 g/dL — ABNORMAL LOW (ref 3.5–5.0)
Alkaline Phosphatase: 54 U/L (ref 38–126)
Anion gap: 8 (ref 5–15)
BUN: 32 mg/dL — ABNORMAL HIGH (ref 8–23)
CO2: 23 mmol/L (ref 22–32)
Calcium: 8.5 mg/dL — ABNORMAL LOW (ref 8.9–10.3)
Chloride: 103 mmol/L (ref 98–111)
Creatinine, Ser: 1.52 mg/dL — ABNORMAL HIGH (ref 0.61–1.24)
GFR, Estimated: 46 mL/min — ABNORMAL LOW (ref >60)
Glucose, Bld: 335 mg/dL — ABNORMAL HIGH (ref 70–99)
Potassium: 3.8 mmol/L (ref 3.5–5.1)
Sodium: 134 mmol/L — ABNORMAL LOW (ref 135–145)
Total Bilirubin: 0.8 mg/dL (ref 0.0–1.2)
Total Protein: 6.7 g/dL (ref 6.5–8.1)

## 2023-10-13 LAB — CBC
HCT: 33.3 % — ABNORMAL LOW (ref 39.0–52.0)
Hemoglobin: 11.4 g/dL — ABNORMAL LOW (ref 13.0–17.0)
MCH: 34 pg (ref 26.0–34.0)
MCHC: 34.2 g/dL (ref 30.0–36.0)
MCV: 99.4 fL (ref 80.0–100.0)
Platelets: 191 10*3/uL (ref 150–400)
RBC: 3.35 MIL/uL — ABNORMAL LOW (ref 4.22–5.81)
RDW: 12.8 % (ref 11.5–15.5)
WBC: 8.4 10*3/uL (ref 4.0–10.5)
nRBC: 0 % (ref 0.0–0.2)

## 2023-10-13 LAB — MAGNESIUM: Magnesium: 2.2 mg/dL (ref 1.7–2.4)

## 2023-10-13 LAB — GLUCOSE, CAPILLARY
Glucose-Capillary: 381 mg/dL — ABNORMAL HIGH (ref 70–99)
Glucose-Capillary: 293 mg/dL — ABNORMAL HIGH (ref 70–99)

## 2023-10-13 MED ORDER — CIPROFLOXACIN HCL 500 MG PO TABS
500.0000 mg | ORAL_TABLET | Freq: Two times a day (BID) | ORAL | Status: DC
  Filled 2023-10-13 (×2): qty 1

## 2023-10-13 MED ORDER — CIPROFLOXACIN HCL 500 MG PO TABS: 500.0000 mg | ORAL_TABLET | Freq: Two times a day (BID) | ORAL | 0 refills | Status: DC

## 2023-10-13 NOTE — Plan of Care (Signed)
  Problem: Education: Goal: Ability to describe self-care measures that may prevent or decrease complications (Diabetes Survival Skills Education) will improve Outcome: Progressing   Problem: Coping: Goal: Ability to adjust to condition or change in health will improve Outcome: Progressing   Problem: Fluid Volume: Goal: Ability to maintain a balanced intake and output will improve Outcome: Progressing   Problem: Nutritional: Goal: Maintenance of adequate nutrition will improve Outcome: Progressing   Problem: Skin Integrity: Goal: Risk for impaired skin integrity will decrease Outcome: Progressing   Problem: Tissue Perfusion: Goal: Adequacy of tissue perfusion will improve Outcome: Progressing   Problem: Respiratory: Goal: Ability to maintain adequate ventilation will improve Outcome: Progressing   Problem: Activity: Goal: Risk for activity intolerance will decrease Outcome: Progressing   Problem: Coping: Goal: Level of anxiety will decrease Outcome: Progressing   Problem: Elimination: Goal: Will not experience complications related to bowel motility Outcome: Progressing

## 2023-10-13 NOTE — Discharge Summary (Signed)
Physician Discharge Summary   Patient: Christian [name] MRN: [id number] DOB: August 02, 1941  Admit date:     10/10/2023  Discharge date: 10/13/23  Discharge Physician: Kirstie Peri   PCP: Ethelda Chick, MD   Recommendations at discharge:    Follow with PCP in 1 week   Discharge Diagnoses: Principal Problem:   Sepsis  Active Problems:   Respiratory syncytial virus (RSV) infection   Acute metabolic encephalopathy   Acute renal failure superimposed on stage 3a chronic kidney disease    Uncontrolled type 1 diabetes mellitus with hyperglycemia, with long-term current use of insulin    Long term (current) use of anticoagulants   Paroxysmal atrial fibrillation    Coronary artery disease   Essential hypertension   Bladder cancer metastasized to intra-abdominal lymph nodes    Adrenal insufficiency due to cancer therapy  Brief hospital course:   83 y.o. male with medical history significant for Metastatic bladder cancer s/p urostomy on pembrolizumab , complicated by immune related type 1 diabetes, immune hypophysitis on hydrocortisone and primary hypothyroidism on levothyroxine as well as history of A-fib on Eliquis, hypertension and frequent UTIs who was brought to the ED for evaluation of altered mental status, generalized malaise, weakness and fever.  Wife at bedside gives history and states that at baseline patient is very astute however he appeared disoriented.  She also reports decreased oral intake and also had a cough. ED course and data review: Febrile to 102.7, tachycardic to 103 Labs notable for WBC 11,400 with lactic acid 1.3  Blood glucose 298 with creatinine 1.81 up from baseline of 1.5 07/2023 Chest x-ray nonacute Respiratory viral panel showingg RSV  Patient started on aztreonam later switched to Meropenam.  2/17 : Patient was admitted for sepsis/acute metabolic encephalopathy and RSV.  Patient was empirically started on aztreonam and vancomycin which was later switched to  meropenem and vancomycin.  Patient had remarkable improvement in symptoms symptoms.  Metabolic encephalopathy improved patient is back to baseline in 48 hours, acute presentation likely secondary to UTI with concomitant infection with RSV.  Was saturating normal on room air with no respiratory distress.  His home medications were continued for his comorbid conditions.  As patient has history of cancer was empirically kept on meropenem which was switched to ciprofloxacin on the day of discharge for additional 4 days.  Patient remained afebrile throughout the hospital course.  On the day of discharge patient was hemodynamically stable afebrile labs within reference range.  Plan of discharge was discussed with patient who is in agreement.  Advised the patient to seek urgent emergent help if any sudden change in status.    Assessment and Plan:  Sepsis  / History of frequent UTIs   Urinalysis mild UTI.  Chest x-ray clear respiratory viral panel + RSV  CT abdomen and pelvis---> no acute findings In the interim we will treat for sepsis of unknown source Sepsis fluids Broad-spectrum antibiotics, meropenem and vancomycin   Respiratory syncytial virus (RSV) infection - improved    Following admission respiratory viral panel was ordered returning positive for RSV Given immunosuppressed status, will continue antibiotics Procalcitonin ordered plan to de-escalate antibiotics if less than 0.10 Antitussives, incentive spirometer, albuterol as needed and supplemental oxygen if needed to keep sats over 94%   Acute metabolic encephalopathy Secondary to sepsis Neurologic checks with fall and aspiration precautions   Acute renal failure superimposed on stage 3a chronic kidney disease  Expecting improvement to baseline with IV fluid resuscitation   Paroxysmal atrial  fibrillation  Chronic anticoagulation Continue apixaban Hold Eliquis due to soft blood pressure   Uncontrolled type 1 diabetes mellitus with  hyperglycemia, with long-term current use of insulin Immune related type 1 diabetes Sliding scale insulin coverage   Adrenal insufficiency due to cancer therapy  Primary hypothyroidism Immune hypophysitis/primary adrenal insufficiency Stress dose steroids-hydrocortisone 100 mg every 6 Continue levothyroxine   Bladder cancer metastasized to intra-abdominal lymph nodes  Urostomy status Followed at [facility].  On pembrolizumab   Essential hypertension Holding antihypertensives due to soft blood pressure   Coronary artery disease No acute issues suspected Continue atorvastatin and apixaban.  Holding metoprolol     Pain control - [place] Controlled Substance Reporting System database was reviewed. and patient was instructed, not to drive, operate heavy machinery, perform activities at heights, swimming or participation in water activities or provide baby-sitting services while on Pain, Sleep and Anxiety Medications; until their outpatient Physician has advised to do so again. Also recommended to not to take more than prescribed Pain, Sleep and Anxiety Medications.  Consultants: None  Procedures performed: None   Disposition: Home Diet recommendation:  Discharge Diet Orders (From admission, onward)     Start     Ordered   10/13/23 0000  Diet - low sodium heart healthy        10/13/23 1144           Cardiac diet DISCHARGE MEDICATION: Allergies as of 10/13/2023       Reactions   Ceftriaxone Hives   Tolerated cefazolin 09/2021 without reaction   Other Hives   Sulfasalazine Hives   Sulfa Antibiotics Hives        Medication List     TAKE these medications    apixaban 5 MG Tabs tablet Commonly known as: Eliquis Take 1 tablet (5 mg total) by mouth 2 (two) times daily.   Apremilast 30 MG Tabs Take 1 tablet by mouth 2 (two) times daily.   atorvastatin 10 MG tablet Commonly known as: LIPITOR Take 1 tablet (10 mg total) by mouth daily at 6 PM.   ciprofloxacin 500 MG  tablet Commonly known as: CIPRO Take 1 tablet (500 mg total) by mouth 2 (two) times daily.   Dexcom G7 Receiver Hardie Pulley See admin instructions.   Dexcom G7 Sensor Misc USE 1 EACH EVERY 10 DAYS   fluticasone 50 MCG/ACT nasal spray Commonly known as: FLONASE Place 2 sprays into both nostrils as needed.   gabapentin 300 MG capsule Commonly known as: NEURONTIN Take 600 mg by mouth 2 (two) times daily.   hydrocortisone 10 MG tablet Commonly known as: CORTEF Take 20 mg in the morning and 10 mg at night   Insulin Aspart FlexPen 100 UNIT/ML Commonly known as: NOVOLOG Inject 25-40 units subcutaneously before meals three times daily as directed. Max Total Daily Dose 130 units per day.   insulin degludec 100 UNIT/ML FlexTouch Pen Commonly known as: TRESIBA Inject into the skin.   Lantus SoloStar 100 UNIT/ML Solostar Pen Generic drug: insulin glargine Inject 48 Units into the skin at bedtime.   levothyroxine 112 MCG tablet Commonly known as: SYNTHROID Take 112 mcg by mouth daily before breakfast.   lidocaine 5 % Commonly known as: LIDODERM Place onto the skin as directed.   metFORMIN 500 MG 24 hr tablet Commonly known as: GLUCOPHAGE-XR Take by mouth 2 (two) times daily with a meal. Take 500 mg in the morning and 100 mg at night.   metoprolol succinate 25 MG 24 hr tablet Commonly known  as: TOPROL-XL TAKE 0.5 TABLETS BY MOUTH DAILY. TAKE WITH OR IMMEDIATELY FOLLOWING A MEAL.   midodrine 10 MG tablet Commonly known as: PROAMATINE Take 1 tablet (10 mg total) by mouth 3 (three) times daily.   nitroGLYCERIN 0.4 MG SL tablet Commonly known as: NITROSTAT Place 1 tablet (0.4 mg total) under the tongue every 5 (five) minutes as needed for chest pain.   OneTouch Delica Plus Lancet33G Misc Apply 1 each topically 3 (three) times daily.   OneTouch Ultra test strip Generic drug: glucose blood 1 each (1 strip total) 3 (three) times daily Use as instructed. Dx code: E11.9   oxyCODONE  5 MG immediate release tablet Commonly known as: Oxy IR/ROXICODONE Take 1-2 tablets (5-10 mg total) by mouth every 4 (four) hours as needed for Pain for up to 30 days Take 1 tablet (5mg ) by mouth every 4 (four) hours for pain 5-7/10.Take 2 tablet (10mg ) by mouth every 4 (four) hours for pain 8-10/10.   Semaglutide(0.25 or 0.5MG /DOS) 2 MG/3ML Sopn Inject 0.375 mLs (0.25 mg total) subcutaneously once a week for 28 days, THEN 0.75 mLs (0.5 mg total) once a week.        Discharge Exam: Filed Weights   10/10/23 2127  Weight: 113.9 kg   Physical Exam HENT:     Head: Normocephalic.  Eyes:     Extraocular Movements: Extraocular movements intact.     Pupils: Pupils are equal, round, and reactive to light.  Cardiovascular:     Rate and Rhythm: Normal rate.  Pulmonary:     Effort: Pulmonary effort is normal.     Breath sounds: Normal breath sounds.  Abdominal:     Palpations: Abdomen is soft.  Musculoskeletal:        General: Normal range of motion.     Cervical back: Normal range of motion.  Skin:    General: Skin is warm.  Neurological:     General: No focal deficit present.     Mental Status: He is alert and oriented to person, place, and time.  Psychiatric:        Mood and Affect: Mood normal.        Behavior: Behavior normal.      Condition at discharge: good  The results of significant diagnostics from this hospitalization (including imaging, microbiology, ancillary and laboratory) are listed below for reference.   Imaging Studies: CT ABDOMEN PELVIS WO CONTRAST Result Date: 10/11/2023 CLINICAL DATA:  Possible sepsis EXAM: CT ABDOMEN AND PELVIS WITHOUT CONTRAST TECHNIQUE: Multidetector CT imaging of the abdomen and pelvis was performed following the standard protocol without IV contrast. RADIATION DOSE REDUCTION: This exam was performed according to the departmental dose-optimization program which includes automated exposure control, adjustment of the mA and/or kV according  to patient size and/or use of iterative reconstruction technique. COMPARISON:  11/07/2020 PET-CT FINDINGS: Lower chest: Mild scarring is noted in the right lung base. Hepatobiliary: No focal liver abnormality is seen. No gallstones, gallbladder wall thickening, or biliary dilatation. Pancreas: Unremarkable. No pancreatic ductal dilatation or surrounding inflammatory changes. Spleen: Normal in size without focal abnormality. Adrenals/Urinary Tract: Adrenal glands are within normal limits. Kidneys demonstrate a normal appearance without renal calculi or obstructive change. The ureters are unremarkable. The bladder has been surgically removed. Urostomy is noted in the right lower quadrant. No obstructive changes are seen. Stomach/Bowel: Mild diverticular changes noted without evidence of diverticulitis. No obstructive or inflammatory changes of the colon are seen. The appendix is within normal limits. Small bowel and stomach  are unremarkable. Vascular/Lymphatic: Aortic atherosclerosis. No enlarged abdominal or pelvic lymph nodes. Reproductive: Prostate has been surgically removed. Other: No abdominal wall hernia or abnormality. No abdominopelvic ascites. Musculoskeletal: No acute or significant osseous findings. IMPRESSION: Diverticular change without diverticulitis. Right lower quadrant urostomy without complicating factors. Electronically Signed   By: Alcide Clever M.D.   On: 10/11/2023 01:30   DG Chest Port 1 View Result Date: 10/10/2023 CLINICAL DATA:  Sepsis EXAM: PORTABLE CHEST 1 VIEW COMPARISON:  11/17/2017 FINDINGS: Minimal left basilar scarring. Lungs are otherwise clear. No pneumothorax or pleural effusion. Right internal jugular chest port tip is seen within the right atrium. Cardiac size within normal limits. Pulmonary vascularity is. No acute bone abnormality. IMPRESSION: 1. No active disease. Electronically Signed   By: Helyn Numbers M.D.   On: 10/10/2023 22:28    Microbiology: Results for orders  placed or performed during the hospital encounter of 10/10/23  Blood Culture (routine x 2)     Status: None (Preliminary result)   Collection Time: 10/10/23  9:22 PM   Specimen: BLOOD LEFT HAND  Result Value Ref Range Status   Specimen Description BLOOD LEFT HAND  Final   Special Requests   Final    BOTTLES DRAWN AEROBIC AND ANAEROBIC Blood Culture adequate volume   Culture   Final    NO GROWTH 3 DAYS Performed at [facility], [address], [place], [place] [address]    Report Status PENDING  Incomplete  Blood Culture (routine x 2)     Status: None (Preliminary result)   Collection Time: 10/10/23  9:22 PM   Specimen: Left Antecubital; Blood  Result Value Ref Range Status   Specimen Description LEFT ANTECUBITAL  Final   Special Requests   Final    BOTTLES DRAWN AEROBIC AND ANAEROBIC Blood Culture adequate volume   Culture   Final    NO GROWTH 3 DAYS Performed at [facility], [address] [place], [place] [address]    Report Status PENDING  Incomplete  Resp panel by RT-PCR (RSV, Flu A&B, Covid) Anterior Nasal Swab     Status: Abnormal   Collection Time: 10/11/23 12:38 AM   Specimen: Anterior Nasal Swab  Result Value Ref Range Status   SARS Coronavirus 2 by RT PCR NEGATIVE NEGATIVE Final    Comment: (NOTE) SARS-CoV-2 target nucleic acids are NOT DETECTED.  The SARS-CoV-2 RNA is generally detectable in upper respiratory specimens during the acute phase of infection. The lowest concentration of SARS-CoV-2 viral copies this assay can detect is 138 copies/mL. A negative result does not preclude SARS-Cov-2 infection and should not be used as the sole basis for treatment or other patient management decisions. A negative result may occur with  improper specimen collection/handling, submission of specimen other than nasopharyngeal swab, presence of viral mutation(s) within the areas targeted by this assay, and inadequate number of viral copies(<138  copies/mL). A negative result must be combined with clinical observations, patient history, and epidemiological information. The expected result is Negative.  Fact Sheet for Patients:  BloggerCourse.com  Fact Sheet for Healthcare Providers:  SeriousBroker.it  This test is no t yet approved or cleared by the [place] FDA and  has been authorized for detection and/or diagnosis of SARS-CoV-2 by FDA under an Emergency Use Authorization (EUA). This EUA will remain  in effect (meaning this test can be used) for the duration of the COVID-19 declaration under Section 564(b)(1) of the Act, 21 U.S.C.section 360bbb-3(b)(1), unless the authorization is terminated  or revoked sooner.       Influenza A by PCR NEGATIVE NEGATIVE Final   Influenza B by PCR NEGATIVE NEGATIVE Final    Comment: (NOTE) The Xpert Xpress SARS-CoV-2/FLU/RSV plus assay is intended as an aid in the diagnosis of influenza from Nasopharyngeal swab specimens and should not be used as a sole basis for treatment. Nasal washings and aspirates are unacceptable for Xpert Xpress SARS-CoV-2/FLU/RSV testing.  Fact Sheet for Patients: BloggerCourse.com  Fact Sheet for Healthcare Providers: SeriousBroker.it  This test is not yet approved or cleared by the [place] FDA and has been authorized for detection and/or diagnosis of SARS-CoV-2 by FDA under an Emergency Use Authorization (EUA). This EUA will remain in effect (meaning this test can be used) for the duration of the COVID-19 declaration under Section 564(b)(1) of the Act, 21 U.S.C. section 360bbb-3(b)(1), unless the authorization is terminated or revoked.     Resp Syncytial Virus by PCR POSITIVE (A) NEGATIVE Final    Comment: (NOTE) Fact Sheet for Patients: BloggerCourse.com  Fact Sheet for Healthcare  Providers: SeriousBroker.it  This test is not yet approved or cleared by the [place] FDA and has been authorized for detection and/or diagnosis of SARS-CoV-2 by FDA under an Emergency Use Authorization (EUA). This EUA will remain in effect (meaning this test can be used) for the duration of the COVID-19 declaration under Section 564(b)(1) of the Act, 21 U.S.C. section 360bbb-3(b)(1), unless the authorization is terminated or revoked.  Performed at [facility], [address] [place], [place] [address]   MRSA Next Gen by PCR, Nasal     Status: None   Collection Time: 10/11/23  1:53 AM   Specimen: Nasal Mucosa; Nasal Swab  Result Value Ref Range Status   MRSA by PCR Next Gen NOT DETECTED NOT DETECTED Final    Comment: (NOTE) The GeneXpert MRSA Assay (FDA approved for NASAL specimens only), is one component of a comprehensive MRSA colonization surveillance program. It is not intended to diagnose MRSA infection nor to guide or monitor treatment for MRSA infections. Test performance is not FDA approved in patients less than 60 years old. Performed at [facility], [address] [place], [place] [address]     Labs: CBC: Recent Labs  Lab 10/10/23 2126 10/12/23 0440 10/13/23 0559  WBC 11.4* 10.0 8.4  NEUTROABS 7.2  --   --   HGB 13.0 11.0* 11.4*  HCT 38.8* 31.9* 33.3*  MCV 102.4* 99.4 99.4  PLT 194 184 191   Basic Metabolic Panel: Recent Labs  Lab 10/10/23 2126 10/11/23 0345 10/11/23 2219 10/12/23 0440 10/13/23 0559  NA 132* 129* 131* 131* 134*  K 4.2 4.1 4.1 4.3 3.8  CL 94* 97* 100 100 103  CO2 23 21* 20* 19* 23  GLUCOSE 298* 334* 328* 368* 335*  BUN 25* 28* 29* 30* 32*  CREATININE 1.81* 2.11* 1.66* 1.64* 1.52*  CALCIUM 9.0 8.3* 8.4* 8.4* 8.5*  MG  --   --   --  2.1 2.2   Liver Function Tests: Recent Labs  Lab 10/10/23 2126 10/12/23 0440 10/13/23 0559  AST 16 17 13*  ALT 10 7 9   ALKPHOS 72 57 54   BILITOT 1.1 1.1 0.8  PROT 7.8 6.6 6.7  ALBUMIN 4.0 3.1* 3.1*   CBG: Recent Labs  Lab 10/12/23 1205 10/12/23 1607 10/12/23 2109 10/13/23 0752 10/13/23 1133  GLUCAP 278* 333* 329* 381* 293*    Discharge time spent: greater than 30 minutes.  Signed: Alric Ran  Lucianne Muss, MD [facility] 10/13/2023

## 2023-10-13 NOTE — Inpatient Diabetes Management (Signed)
Inpatient Diabetes Program Recommendations  AACE/ADA: New Consensus Statement on Inpatient Glycemic Control   Target Ranges:  Prepandial:   less than 140 mg/dL      Peak postprandial:   less than 180 mg/dL (1-2 hours)      Critically ill patients:  140 - 180 mg/dL    Latest Reference Range & Units 10/12/23 07:21 10/12/23 12:05 10/12/23 16:07 10/12/23 21:09 10/13/23 07:52  Glucose-Capillary 70 - 99 mg/dL 914 (H) 782 (H) 956 (H) 329 (H) 381 (H)    Review of Glycemic Control  Diabetes history: DM1 Outpatient Diabetes medications: Lantus 48 units at bedtime, Novolog 25-40 units with meals, Metformin 500 mg QAM, 1000 mg QPM, Ozempic 0.25 mg Qweek (prescribed 09/16/23 for DM with CKD) Current orders for Inpatient glycemic control: Semglee 20 units at bedtime, Novolog 0-20 units TID with meals, Novolog 0-5 units at bedtime; Solucortef 100 mg Q6H  Inpatient Diabetes Program Recommendations:    Insulin: CBGs 278-392 mg/dl on 2/13 and 086 mg/dl this morning. If steroids are continued as ordered, consider increasing Semglee to 30 units at bedtime and adding Novolog 10 units TID with meals for meal coverage if patient eats at least 50% of meals.  Thanks, Orlando Penner, RN, MSN, CDCES Diabetes Coordinator Inpatient Diabetes Program [phone] (Team Pager from 8am to 5pm)

## 2023-10-15 LAB — CULTURE, BLOOD (ROUTINE X 2)
Culture: NO GROWTH
Culture: NO GROWTH
Special Requests: ADEQUATE
Special Requests: ADEQUATE

## 2023-10-17 ENCOUNTER — Emergency Department: Payer: Medicare HMO

## 2023-10-17 ENCOUNTER — Encounter: Payer: Self-pay | Admitting: Emergency Medicine

## 2023-10-17 ENCOUNTER — Other Ambulatory Visit: Payer: Self-pay

## 2023-10-17 ENCOUNTER — Inpatient Hospital Stay
Admission: EM | Admit: 2023-10-17 | Discharge: 2023-10-20 | DRG: 638 | Disposition: A | Discharge status: Home Health | Payer: Medicare HMO | Attending: Osteopathic Medicine | Admitting: Osteopathic Medicine

## 2023-10-17 DIAGNOSIS — Z1152 Encounter for screening for COVID-19: Secondary | ICD-10-CM | POA: Diagnosis not present

## 2023-10-17 DIAGNOSIS — G4730 Sleep apnea, unspecified: Secondary | ICD-10-CM | POA: Diagnosis present

## 2023-10-17 DIAGNOSIS — Z7985 Long-term (current) use of injectable non-insulin antidiabetic drugs: Secondary | ICD-10-CM | POA: Diagnosis not present

## 2023-10-17 DIAGNOSIS — I82509 Chronic embolism and thrombosis of unspecified deep veins of unspecified lower extremity: Secondary | ICD-10-CM | POA: Diagnosis present

## 2023-10-17 DIAGNOSIS — R627 Adult failure to thrive: Secondary | ICD-10-CM | POA: Diagnosis present

## 2023-10-17 DIAGNOSIS — E8729 Other acidosis: Secondary | ICD-10-CM | POA: Diagnosis not present

## 2023-10-17 DIAGNOSIS — I129 Hypertensive chronic kidney disease with stage 1 through stage 4 chronic kidney disease, or unspecified chronic kidney disease: Secondary | ICD-10-CM | POA: Diagnosis present

## 2023-10-17 DIAGNOSIS — B338 Other specified viral diseases: Secondary | ICD-10-CM | POA: Diagnosis present

## 2023-10-17 DIAGNOSIS — E039 Hypothyroidism, unspecified: Secondary | ICD-10-CM | POA: Insufficient documentation

## 2023-10-17 DIAGNOSIS — Z794 Long term (current) use of insulin: Secondary | ICD-10-CM | POA: Diagnosis not present

## 2023-10-17 DIAGNOSIS — R059 Cough, unspecified: Secondary | ICD-10-CM

## 2023-10-17 DIAGNOSIS — C772 Secondary and unspecified malignant neoplasm of intra-abdominal lymph nodes: Secondary | ICD-10-CM | POA: Diagnosis present

## 2023-10-17 DIAGNOSIS — B974 Respiratory syncytial virus as the cause of diseases classified elsewhere: Secondary | ICD-10-CM | POA: Diagnosis present

## 2023-10-17 DIAGNOSIS — Z882 Allergy status to sulfonamides status: Secondary | ICD-10-CM

## 2023-10-17 DIAGNOSIS — E2740 Unspecified adrenocortical insufficiency: Secondary | ICD-10-CM | POA: Diagnosis present

## 2023-10-17 DIAGNOSIS — Z7984 Long term (current) use of oral hypoglycemic drugs: Secondary | ICD-10-CM

## 2023-10-17 DIAGNOSIS — I252 Old myocardial infarction: Secondary | ICD-10-CM

## 2023-10-17 DIAGNOSIS — A419 Sepsis, unspecified organism: Secondary | ICD-10-CM | POA: Diagnosis present

## 2023-10-17 DIAGNOSIS — E871 Hypo-osmolality and hyponatremia: Secondary | ICD-10-CM | POA: Diagnosis present

## 2023-10-17 DIAGNOSIS — E876 Hypokalemia: Secondary | ICD-10-CM | POA: Insufficient documentation

## 2023-10-17 DIAGNOSIS — I2510 Atherosclerotic heart disease of native coronary artery without angina pectoris: Secondary | ICD-10-CM | POA: Diagnosis present

## 2023-10-17 DIAGNOSIS — Z7901 Long term (current) use of anticoagulants: Secondary | ICD-10-CM | POA: Diagnosis not present

## 2023-10-17 DIAGNOSIS — I700 Atherosclerosis of aorta: Secondary | ICD-10-CM | POA: Diagnosis present

## 2023-10-17 DIAGNOSIS — N1831 Chronic kidney disease, stage 3a: Secondary | ICD-10-CM | POA: Diagnosis present

## 2023-10-17 DIAGNOSIS — E66812 Obesity, class 2: Secondary | ICD-10-CM | POA: Diagnosis present

## 2023-10-17 DIAGNOSIS — Z936 Other artificial openings of urinary tract status: Secondary | ICD-10-CM

## 2023-10-17 DIAGNOSIS — I10 Essential (primary) hypertension: Secondary | ICD-10-CM | POA: Diagnosis present

## 2023-10-17 DIAGNOSIS — R531 Weakness: Secondary | ICD-10-CM | POA: Diagnosis present

## 2023-10-17 DIAGNOSIS — E273 Drug-induced adrenocortical insufficiency: Secondary | ICD-10-CM | POA: Diagnosis present

## 2023-10-17 DIAGNOSIS — E1065 Type 1 diabetes mellitus with hyperglycemia: Secondary | ICD-10-CM | POA: Diagnosis present

## 2023-10-17 DIAGNOSIS — Z79890 Hormone replacement therapy (postmenopausal): Secondary | ICD-10-CM | POA: Diagnosis not present

## 2023-10-17 DIAGNOSIS — Z86718 Personal history of other venous thrombosis and embolism: Secondary | ICD-10-CM

## 2023-10-17 DIAGNOSIS — E785 Hyperlipidemia, unspecified: Secondary | ICD-10-CM | POA: Diagnosis present

## 2023-10-17 DIAGNOSIS — R590 Localized enlarged lymph nodes: Secondary | ICD-10-CM | POA: Diagnosis present

## 2023-10-17 DIAGNOSIS — Z79899 Other long term (current) drug therapy: Secondary | ICD-10-CM | POA: Diagnosis not present

## 2023-10-17 DIAGNOSIS — I480 Paroxysmal atrial fibrillation: Secondary | ICD-10-CM | POA: Diagnosis present

## 2023-10-17 DIAGNOSIS — Z888 Allergy status to other drugs, medicaments and biological substances status: Secondary | ICD-10-CM

## 2023-10-17 DIAGNOSIS — E1010 Type 1 diabetes mellitus with ketoacidosis without coma: Secondary | ICD-10-CM | POA: Diagnosis present

## 2023-10-17 DIAGNOSIS — Z8551 Personal history of malignant neoplasm of bladder: Secondary | ICD-10-CM

## 2023-10-17 DIAGNOSIS — E1022 Type 1 diabetes mellitus with diabetic chronic kidney disease: Secondary | ICD-10-CM | POA: Diagnosis present

## 2023-10-17 DIAGNOSIS — Z6835 Body mass index (BMI) 35.0-35.9, adult: Secondary | ICD-10-CM

## 2023-10-17 DIAGNOSIS — E7800 Pure hypercholesterolemia, unspecified: Secondary | ICD-10-CM | POA: Diagnosis present

## 2023-10-17 DIAGNOSIS — R0789 Other chest pain: Secondary | ICD-10-CM | POA: Insufficient documentation

## 2023-10-17 DIAGNOSIS — N390 Urinary tract infection, site not specified: Secondary | ICD-10-CM

## 2023-10-17 DIAGNOSIS — Z823 Family history of stroke: Secondary | ICD-10-CM

## 2023-10-17 DIAGNOSIS — Z833 Family history of diabetes mellitus: Secondary | ICD-10-CM

## 2023-10-17 DIAGNOSIS — R079 Chest pain, unspecified: Secondary | ICD-10-CM | POA: Diagnosis not present

## 2023-10-17 DIAGNOSIS — I4891 Unspecified atrial fibrillation: Secondary | ICD-10-CM | POA: Diagnosis present

## 2023-10-17 DIAGNOSIS — Z801 Family history of malignant neoplasm of trachea, bronchus and lung: Secondary | ICD-10-CM

## 2023-10-17 DIAGNOSIS — R0902 Hypoxemia: Secondary | ICD-10-CM

## 2023-10-17 DIAGNOSIS — E1310 Other specified diabetes mellitus with ketoacidosis without coma: Secondary | ICD-10-CM

## 2023-10-17 LAB — URINALYSIS, W/ REFLEX TO CULTURE (INFECTION SUSPECTED)
Bilirubin Urine: NEGATIVE
Glucose, UA: 150 mg/dL — AB
Ketones, ur: 20 mg/dL — AB
Nitrite: NEGATIVE
Protein, ur: 30 mg/dL — AB
Specific Gravity, Urine: 1.016 (ref 1.005–1.030)
WBC, UA: >50 WBC/hpf (ref 0–5)
pH: 6 (ref 5.0–8.0)

## 2023-10-17 LAB — CBC WITH DIFFERENTIAL/PLATELET
Abs Immature Granulocytes: 0.1 10*3/uL — ABNORMAL HIGH (ref 0.00–0.07)
Basophils Absolute: 0.1 10*3/uL (ref 0.0–0.1)
Basophils Relative: 1 %
Eosinophils Absolute: 0.3 10*3/uL (ref 0.0–0.5)
Eosinophils Relative: 3 %
HCT: 42.6 % (ref 39.0–52.0)
Hemoglobin: 15.2 g/dL (ref 13.0–17.0)
Immature Granulocytes: 1 %
Lymphocytes Relative: 36 %
Lymphs Abs: 4.6 10*3/uL — ABNORMAL HIGH (ref 0.7–4.0)
MCH: 34.3 pg — ABNORMAL HIGH (ref 26.0–34.0)
MCHC: 35.7 g/dL (ref 30.0–36.0)
MCV: 96.2 fL (ref 80.0–100.0)
Monocytes Absolute: 1 10*3/uL (ref 0.1–1.0)
Monocytes Relative: 8 %
Neutro Abs: 6.7 10*3/uL (ref 1.7–7.7)
Neutrophils Relative %: 51 %
Platelets: 265 10*3/uL (ref 150–400)
RBC: 4.43 MIL/uL (ref 4.22–5.81)
RDW: 12.8 % (ref 11.5–15.5)
WBC: 12.8 10*3/uL — ABNORMAL HIGH (ref 4.0–10.5)
nRBC: 0 % (ref 0.0–0.2)

## 2023-10-17 LAB — PROTIME-INR
INR: 1.2 (ref 0.8–1.2)
Prothrombin Time: 15.1 s (ref 11.4–15.2)

## 2023-10-17 LAB — BASIC METABOLIC PANEL
Anion gap: 12 (ref 5–15)
Anion gap: 10 (ref 5–15)
Anion gap: 12 (ref 5–15)
BUN: 17 mg/dL (ref 8–23)
BUN: 16 mg/dL (ref 8–23)
BUN: 16 mg/dL (ref 8–23)
CO2: 26 mmol/L (ref 22–32)
CO2: 26 mmol/L (ref 22–32)
CO2: 25 mmol/L (ref 22–32)
Calcium: 7.9 mg/dL — ABNORMAL LOW (ref 8.9–10.3)
Calcium: 8.2 mg/dL — ABNORMAL LOW (ref 8.9–10.3)
Calcium: 8.4 mg/dL — ABNORMAL LOW (ref 8.9–10.3)
Chloride: 94 mmol/L — ABNORMAL LOW (ref 98–111)
Chloride: 96 mmol/L — ABNORMAL LOW (ref 98–111)
Chloride: 96 mmol/L — ABNORMAL LOW (ref 98–111)
Creatinine, Ser: 1.38 mg/dL — ABNORMAL HIGH (ref 0.61–1.24)
Creatinine, Ser: 1.3 mg/dL — ABNORMAL HIGH (ref 0.61–1.24)
Creatinine, Ser: 1.34 mg/dL — ABNORMAL HIGH (ref 0.61–1.24)
GFR, Estimated: 52 mL/min — ABNORMAL LOW (ref >60)
GFR, Estimated: 56 mL/min — ABNORMAL LOW (ref >60)
GFR, Estimated: 54 mL/min — ABNORMAL LOW (ref >60)
Glucose, Bld: 248 mg/dL — ABNORMAL HIGH (ref 70–99)
Glucose, Bld: 206 mg/dL — ABNORMAL HIGH (ref 70–99)
Glucose, Bld: 188 mg/dL — ABNORMAL HIGH (ref 70–99)
Potassium: 3.5 mmol/L (ref 3.5–5.1)
Potassium: 3.5 mmol/L (ref 3.5–5.1)
Potassium: 3.2 mmol/L — ABNORMAL LOW (ref 3.5–5.1)
Sodium: 132 mmol/L — ABNORMAL LOW (ref 135–145)
Sodium: 132 mmol/L — ABNORMAL LOW (ref 135–145)
Sodium: 133 mmol/L — ABNORMAL LOW (ref 135–145)

## 2023-10-17 LAB — COMPREHENSIVE METABOLIC PANEL
ALT: 13 U/L (ref 0–44)
AST: 16 U/L (ref 15–41)
Albumin: 3.5 g/dL (ref 3.5–5.0)
Alkaline Phosphatase: 61 U/L (ref 38–126)
Anion gap: 17 — ABNORMAL HIGH (ref 5–15)
BUN: 18 mg/dL (ref 8–23)
CO2: 19 mmol/L — ABNORMAL LOW (ref 22–32)
Calcium: 8.8 mg/dL — ABNORMAL LOW (ref 8.9–10.3)
Chloride: 95 mmol/L — ABNORMAL LOW (ref 98–111)
Creatinine, Ser: 1.55 mg/dL — ABNORMAL HIGH (ref 0.61–1.24)
GFR, Estimated: 45 mL/min — ABNORMAL LOW (ref >60)
Glucose, Bld: 257 mg/dL — ABNORMAL HIGH (ref 70–99)
Potassium: 3.1 mmol/L — ABNORMAL LOW (ref 3.5–5.1)
Sodium: 131 mmol/L — ABNORMAL LOW (ref 135–145)
Total Bilirubin: 1.4 mg/dL — ABNORMAL HIGH (ref 0.0–1.2)
Total Protein: 7.7 g/dL (ref 6.5–8.1)

## 2023-10-17 LAB — APTT: aPTT: 33 s (ref 24–36)

## 2023-10-17 LAB — RESP PANEL BY RT-PCR (RSV, FLU A&B, COVID)  RVPGX2
Influenza A by PCR: NEGATIVE
Influenza B by PCR: NEGATIVE
Resp Syncytial Virus by PCR: POSITIVE — AB
SARS Coronavirus 2 by RT PCR: NEGATIVE

## 2023-10-17 LAB — BETA-HYDROXYBUTYRIC ACID
Beta-Hydroxybutyric Acid: 1.41 mmol/L — ABNORMAL HIGH (ref 0.05–0.27)
Beta-Hydroxybutyric Acid: 1.94 mmol/L — ABNORMAL HIGH (ref 0.05–0.27)
Beta-Hydroxybutyric Acid: 1.22 mmol/L — ABNORMAL HIGH (ref 0.05–0.27)
Beta-Hydroxybutyric Acid: 0.64 mmol/L — ABNORMAL HIGH (ref 0.05–0.27)

## 2023-10-17 LAB — BLOOD GAS, VENOUS

## 2023-10-17 LAB — LACTIC ACID, PLASMA
Lactic Acid, Venous: 2.7 mmol/L (ref 0.5–1.9)
Lactic Acid, Venous: 1.8 mmol/L (ref 0.5–1.9)

## 2023-10-17 LAB — BRAIN NATRIURETIC PEPTIDE: B Natriuretic Peptide: 150 pg/mL — ABNORMAL HIGH (ref 0.0–100.0)

## 2023-10-17 LAB — CBG MONITORING, ED
Glucose-Capillary: 255 mg/dL — ABNORMAL HIGH (ref 70–99)
Glucose-Capillary: 219 mg/dL — ABNORMAL HIGH (ref 70–99)
Glucose-Capillary: 213 mg/dL — ABNORMAL HIGH (ref 70–99)

## 2023-10-17 LAB — TROPONIN I (HIGH SENSITIVITY)
Troponin I (High Sensitivity): 24 ng/L — ABNORMAL HIGH (ref <18)
Troponin I (High Sensitivity): 24 ng/L — ABNORMAL HIGH (ref <18)

## 2023-10-17 LAB — MRSA NEXT GEN BY PCR, NASAL: MRSA by PCR Next Gen: NOT DETECTED

## 2023-10-17 LAB — MAGNESIUM: Magnesium: 1.8 mg/dL (ref 1.7–2.4)

## 2023-10-17 LAB — PROCALCITONIN: Procalcitonin: <0.1 ng/mL

## 2023-10-17 LAB — GLUCOSE, CAPILLARY
Glucose-Capillary: 154 mg/dL — ABNORMAL HIGH (ref 70–99)
Glucose-Capillary: 130 mg/dL — ABNORMAL HIGH (ref 70–99)

## 2023-10-17 MED ORDER — LACTATED RINGERS IV SOLN
150.0000 mL/h | INTRAVENOUS | Status: DC
  Administered 2023-10-17: 150 mL/h via INTRAVENOUS

## 2023-10-17 MED ORDER — SODIUM BICARBONATE 8.4 % IV SOLN
50.0000 meq | Freq: Once | INTRAVENOUS | Status: AC
  Administered 2023-10-17: 50 meq via INTRAVENOUS
  Filled 2023-10-17: qty 50

## 2023-10-17 MED ORDER — SODIUM CHLORIDE 0.9 % IV SOLN
2.0000 g | Freq: Two times a day (BID) | INTRAVENOUS | Status: DC
  Administered 2023-10-17 – 2023-10-18 (×2): 2 g via INTRAVENOUS
  Filled 2023-10-17 (×3): qty 12.5

## 2023-10-17 MED ORDER — ONDANSETRON HCL 4 MG/2ML IJ SOLN: 4.0000 mg | Freq: Four times a day (QID) | INTRAMUSCULAR | Status: DC | PRN

## 2023-10-17 MED ORDER — INSULIN REGULAR(HUMAN) IN NACL 100-0.9 UT/100ML-% IV SOLN
INTRAVENOUS | Status: DC
  Administered 2023-10-17: 4.4 [IU]/h via INTRAVENOUS

## 2023-10-17 MED ORDER — DEXTROSE 50 % IV SOLN: 0.0000 mL | INTRAVENOUS | Status: DC | PRN

## 2023-10-17 MED ORDER — DEXTROSE IN LACTATED RINGERS 5 % IV SOLN: INTRAVENOUS | Status: DC

## 2023-10-17 MED ORDER — LACTATED RINGERS IV BOLUS (SEPSIS)
1000.0000 mL | Freq: Once | INTRAVENOUS | Status: AC
  Administered 2023-10-17: 1000 mL via INTRAVENOUS

## 2023-10-17 MED ORDER — INSULIN ASPART 100 UNIT/ML IJ SOLN
0.0000 [IU] | Freq: Every day | INTRAMUSCULAR | Status: DC
  Administered 2023-10-18: 3 [IU] via SUBCUTANEOUS
  Filled 2023-10-17: qty 1

## 2023-10-17 MED ORDER — METRONIDAZOLE 500 MG/100ML IV SOLN
500.0000 mg | Freq: Two times a day (BID) | INTRAVENOUS | Status: DC
  Administered 2023-10-17 – 2023-10-18 (×2): 500 mg via INTRAVENOUS
  Filled 2023-10-17 (×2): qty 100

## 2023-10-17 MED ORDER — ACETAMINOPHEN 650 MG RE SUPP: 650.0000 mg | Freq: Four times a day (QID) | RECTAL | Status: DC | PRN

## 2023-10-17 MED ORDER — LACTATED RINGERS IV SOLN: INTRAVENOUS | Status: DC

## 2023-10-17 MED ORDER — GABAPENTIN 300 MG PO CAPS
600.0000 mg | ORAL_CAPSULE | Freq: Two times a day (BID) | ORAL | Status: DC
  Administered 2023-10-17 – 2023-10-20 (×6): 600 mg via ORAL
  Filled 2023-10-17 (×6): qty 2

## 2023-10-17 MED ORDER — VANCOMYCIN HCL IN DEXTROSE 1-5 GM/200ML-% IV SOLN
1000.0000 mg | Freq: Once | INTRAVENOUS | Status: AC
  Administered 2023-10-17: 1000 mg via INTRAVENOUS
  Filled 2023-10-17: qty 200

## 2023-10-17 MED ORDER — INSULIN GLARGINE-YFGN 100 UNIT/ML ~~LOC~~ SOLN
48.0000 [IU] | Freq: Every day | SUBCUTANEOUS | Status: DC
  Filled 2023-10-17: qty 0.48

## 2023-10-17 MED ORDER — INSULIN REGULAR(HUMAN) IN NACL 100-0.9 UT/100ML-% IV SOLN
INTRAVENOUS | Status: DC
  Administered 2023-10-17: 6.5 [IU]/h via INTRAVENOUS
  Filled 2023-10-17: qty 100

## 2023-10-17 MED ORDER — INSULIN ASPART 100 UNIT/ML IJ SOLN: 0.0000 [IU] | Freq: Every day | INTRAMUSCULAR | Status: DC

## 2023-10-17 MED ORDER — INSULIN GLARGINE-YFGN 100 UNIT/ML ~~LOC~~ SOLN
10.0000 [IU] | Freq: Once | SUBCUTANEOUS | Status: AC
  Administered 2023-10-17: 10 [IU] via SUBCUTANEOUS
  Filled 2023-10-17: qty 0.1

## 2023-10-17 MED ORDER — SENNOSIDES-DOCUSATE SODIUM 8.6-50 MG PO TABS: 1.0000 | ORAL_TABLET | Freq: Every evening | ORAL | Status: DC | PRN

## 2023-10-17 MED ORDER — VANCOMYCIN HCL IN DEXTROSE 1-5 GM/200ML-% IV SOLN
1000.0000 mg | Freq: Once | INTRAVENOUS | Status: AC
Start: 2023-10-17 — End: 2023-10-17
  Administered 2023-10-17: 1000 mg via INTRAVENOUS
  Filled 2023-10-17: qty 200

## 2023-10-17 MED ORDER — METRONIDAZOLE 500 MG/100ML IV SOLN
500.0000 mg | Freq: Once | INTRAVENOUS | Status: AC
  Administered 2023-10-17: 500 mg via INTRAVENOUS
  Filled 2023-10-17: qty 100

## 2023-10-17 MED ORDER — POTASSIUM CHLORIDE 10 MEQ/100ML IV SOLN
10.0000 meq | INTRAVENOUS | Status: AC
  Administered 2023-10-17 (×4): 10 meq via INTRAVENOUS
  Filled 2023-10-17 (×4): qty 100

## 2023-10-17 MED ORDER — NITROGLYCERIN 0.4 MG SL SUBL: 0.4000 mg | SUBLINGUAL_TABLET | SUBLINGUAL | Status: DC | PRN

## 2023-10-17 MED ORDER — CHLORHEXIDINE GLUCONATE CLOTH 2 % EX PADS
6.0000 | MEDICATED_PAD | Freq: Every day | CUTANEOUS | Status: DC
  Administered 2023-10-18 – 2023-10-20 (×3): 6 via TOPICAL

## 2023-10-17 MED ORDER — HYDROCORTISONE 10 MG PO TABS
10.0000 mg | ORAL_TABLET | Freq: Every day | ORAL | Status: DC
  Administered 2023-10-17 – 2023-10-19 (×3): 10 mg via ORAL
  Filled 2023-10-17 (×3): qty 1

## 2023-10-17 MED ORDER — HYDRALAZINE HCL 20 MG/ML IJ SOLN: 5.0000 mg | Freq: Four times a day (QID) | INTRAMUSCULAR | Status: DC | PRN

## 2023-10-17 MED ORDER — POTASSIUM CHLORIDE 10 MEQ/100ML IV SOLN
10.0000 meq | Freq: Once | INTRAVENOUS | Status: AC
  Administered 2023-10-17: 10 meq via INTRAVENOUS
  Filled 2023-10-17: qty 100

## 2023-10-17 MED ORDER — ACETAMINOPHEN 325 MG PO TABS
650.0000 mg | ORAL_TABLET | Freq: Four times a day (QID) | ORAL | Status: DC | PRN
  Administered 2023-10-17 (×2): 650 mg via ORAL
  Filled 2023-10-17 (×2): qty 2

## 2023-10-17 MED ORDER — APIXABAN 5 MG PO TABS
5.0000 mg | ORAL_TABLET | Freq: Two times a day (BID) | ORAL | Status: DC
  Administered 2023-10-17 – 2023-10-20 (×6): 5 mg via ORAL
  Filled 2023-10-17 (×6): qty 1

## 2023-10-17 MED ORDER — SODIUM CHLORIDE 0.9 % IV SOLN
2.0000 g | Freq: Once | INTRAVENOUS | Status: AC
  Administered 2023-10-17: 2 g via INTRAVENOUS
  Filled 2023-10-17: qty 10

## 2023-10-17 MED ORDER — ATORVASTATIN CALCIUM 10 MG PO TABS
10.0000 mg | ORAL_TABLET | Freq: Every day | ORAL | Status: DC
  Administered 2023-10-18 – 2023-10-19 (×2): 10 mg via ORAL
  Filled 2023-10-17 (×2): qty 1

## 2023-10-17 MED ORDER — INSULIN ASPART 100 UNIT/ML IJ SOLN: 0.0000 [IU] | Freq: Three times a day (TID) | INTRAMUSCULAR | Status: DC

## 2023-10-17 MED ORDER — HYDROCORTISONE 10 MG PO TABS
20.0000 mg | ORAL_TABLET | Freq: Every day | ORAL | Status: DC
  Administered 2023-10-18 – 2023-10-20 (×3): 20 mg via ORAL
  Filled 2023-10-17 (×3): qty 2

## 2023-10-17 MED ORDER — LEVOTHYROXINE SODIUM 112 MCG PO TABS
112.0000 ug | ORAL_TABLET | Freq: Every day | ORAL | Status: DC
  Administered 2023-10-18 – 2023-10-20 (×3): 112 ug via ORAL
  Filled 2023-10-17 (×3): qty 1

## 2023-10-17 MED ORDER — POTASSIUM CHLORIDE 10 MEQ/100ML IV SOLN
10.0000 meq | INTRAVENOUS | Status: DC
  Administered 2023-10-17 (×3): 10 meq via INTRAVENOUS
  Filled 2023-10-17 (×3): qty 100

## 2023-10-17 MED ORDER — VANCOMYCIN HCL 1250 MG/250ML IV SOLN
1250.0000 mg | INTRAVENOUS | Status: DC
  Filled 2023-10-17: qty 250

## 2023-10-17 MED ORDER — INSULIN REGULAR(HUMAN) IN NACL 100-0.9 UT/100ML-% IV SOLN: INTRAVENOUS | Status: DC

## 2023-10-17 MED ORDER — ONDANSETRON HCL 4 MG PO TABS: 4.0000 mg | ORAL_TABLET | Freq: Four times a day (QID) | ORAL | Status: DC | PRN

## 2023-10-17 MED ORDER — HYDROCORTISONE 10 MG PO TABS: 20.0000 mg | ORAL_TABLET | ORAL | Status: DC

## 2023-10-17 MED ORDER — LACTATED RINGERS IV BOLUS (SEPSIS)
400.0000 mL | Freq: Once | INTRAVENOUS | Status: AC
  Administered 2023-10-17: 400 mL via INTRAVENOUS

## 2023-10-17 MED ORDER — INSULIN ASPART 100 UNIT/ML IJ SOLN
0.0000 [IU] | Freq: Three times a day (TID) | INTRAMUSCULAR | Status: DC
  Administered 2023-10-17 – 2023-10-18 (×2): 3 [IU] via SUBCUTANEOUS
  Administered 2023-10-18: 7 [IU] via SUBCUTANEOUS
  Filled 2023-10-17 (×3): qty 1

## 2023-10-17 NOTE — Plan of Care (Signed)

## 2023-10-17 NOTE — ED Triage Notes (Signed)
Pt arrived via [facility] from home reports recent admission for RSV last week, over the past week since DC, pt has been progressively weak, on EMS arrival pt had sats 93% was placed on 2L up to 96%,   In triage pt found to be hypotensive and hypoxic.   Pt has urostomy.

## 2023-10-17 NOTE — Assessment & Plan Note (Signed)
Corrected serum sodium is 135, [place], 1999

## 2023-10-17 NOTE — Progress Notes (Signed)
Pt arrival to unit and placed on monitor. Vital signs WNL. Pt bathed with CHG on arrival, and blood sugar taken. Endo tool resumed and insulin adjusted according to endo tool, see MAR. Plan to d/c insulin drip and endo tool two hours after long acting insulin was given - as per protocol. Dr Sedalia Muta contacted regarding diet order for pt, awaiting response. Pt back pain addressed with PRN Tylenol, see MAR. Pt's wife on phone via pt's personal device and given update on condition and location. Pt resting with call light within reach.

## 2023-10-17 NOTE — ED Provider Notes (Signed)
[facility] Provider Note    Event Date/Time   First MD Initiated Contact with Patient 10/17/23 1109     (approximate)   History   Weakness   HPI  Christian [name] is a 83 y.o. male with history of DVT on Eliquis, history of diabetes, CAD, hypertension, hyperlipidemia, history of adrenal insufficiency due to cancer therapy,  Presenting with shortness of breath, cough.  Per EMS patient was recently discharged for RSV and UTI.  Has been progressively weak.  Found him satting at 93% on room air, placed on 2 L nasal cannula here.  Was found to be hypotensive and satting 98% on room air here.  He denies any abdominal pain, no nausea, vomiting, diarrhea.  Has a urostomy bag with some dark urine.  Independent history obtained from EMS  Indepndent review of chart, he was discharged on 17 February was admitted for metabolic encephalopathy in the setting of UTI and RSV.  He was started on aztreonam and vancomycin and then switched to meropenem.  Had a nuclear stress test done in 2021 that showed an EF of 50%.     Physical Exam   Triage Vital Signs: ED Triage Vitals  Encounter Vitals Group     BP 10/17/23 1103 (!) 80/62     Systolic BP Percentile --      Diastolic BP Percentile --      Pulse Rate 10/17/23 1103 96     Resp 10/17/23 1103 (!) 22     Temp 10/17/23 1103 (!) 97.4 F (36.3 C)     Temp Source 10/17/23 1103 Oral     SpO2 10/17/23 1103 (!) 89 %     Weight 10/17/23 1102 251 lb (113.9 kg)     Height 10/17/23 1102 5\' 10"  (1.778 m)     Head Circumference --      Peak Flow --      Pain Score 10/17/23 1102 0     Pain Loc --      Pain Education --      Exclude from Growth Chart --     Most recent vital signs: Vitals:   10/17/23 1103 10/17/23 1103  BP: (!) 80/62   Pulse: 96   Resp: (!) 22   Temp: (!) 97.4 F (36.3 C)   SpO2: (!) 89% 95%     General: Awake, no distress.  CV:  Good peripheral perfusion.  Resp:  Normal effort.  Clear Abd:  No  distention.  Urostomy bag in place with scant dark urine Other:  No unilateral calf swelling or tenderness.   ED Results / Procedures / Treatments   Labs (all labs ordered are listed, but only abnormal results are displayed) Labs Reviewed  RESP PANEL BY RT-PCR (RSV, FLU A&B, COVID)  RVPGX2 - Abnormal; Notable for the following components:      Result Value   Resp Syncytial Virus by PCR POSITIVE (*)    All other components within normal limits  LACTIC ACID, PLASMA - Abnormal; Notable for the following components:   Lactic Acid, Venous 2.7 (*)    All other components within normal limits  COMPREHENSIVE METABOLIC PANEL - Abnormal; Notable for the following components:   Sodium 131 (*)    Potassium 3.1 (*)    Chloride 95 (*)    CO2 19 (*)    Glucose, Bld 257 (*)    Creatinine, Ser 1.55 (*)    Calcium 8.8 (*)    Total Bilirubin 1.4 (*)  GFR, Estimated 45 (*)    Anion gap 17 (*)    All other components within normal limits  CBC WITH DIFFERENTIAL/PLATELET - Abnormal; Notable for the following components:   WBC 12.8 (*)    MCH 34.3 (*)    Lymphs Abs 4.6 (*)    Abs Immature Granulocytes 0.10 (*)    All other components within normal limits  URINALYSIS, W/ REFLEX TO CULTURE (INFECTION SUSPECTED) - Abnormal; Notable for the following components:   Color, Urine YELLOW (*)    APPearance CLOUDY (*)    Glucose, UA 150 (*)    Hgb urine dipstick SMALL (*)    Ketones, ur 20 (*)    Protein, ur 30 (*)    Leukocytes,Ua TRACE (*)    Bacteria, UA RARE (*)    All other components within normal limits  BLOOD GAS, VENOUS - Abnormal; Notable for the following components:   pO2, Ven <31 (*)    Bicarbonate 28.6 (*)    All other components within normal limits  BETA-HYDROXYBUTYRIC ACID - Abnormal; Notable for the following components:   Beta-Hydroxybutyric Acid 1.41 (*)    All other components within normal limits  BRAIN NATRIURETIC PEPTIDE - Abnormal; Notable for the following components:    B Natriuretic Peptide 150.0 (*)    All other components within normal limits  CULTURE, BLOOD (ROUTINE X 2)  CULTURE, BLOOD (ROUTINE X 2)  URINE CULTURE  PROTIME-INR  APTT  LACTIC ACID, PLASMA  BASIC METABOLIC PANEL  BASIC METABOLIC PANEL  BASIC METABOLIC PANEL  BASIC METABOLIC PANEL  CBG MONITORING, ED     EKG  EKG shows sinus rhythm, rate 82, normal QRS, normal QTc, T wave flattening in aVL, no ischemic ST elevation, not significant change compared to prior   RADIOLOGY Chest x-ray on my interpretation without focal consolidation   PROCEDURES:  Critical Care performed: Yes, see critical care procedure note(s)  .Critical Care  Performed by: Claybon Jabs, MD Authorized by: Claybon Jabs, MD   Critical care provider statement:    Critical care time (minutes):  35   Critical care was necessary to treat or prevent imminent or life-threatening deterioration of the following conditions:  Sepsis   Critical care was time spent personally by me on the following activities:  Development of treatment plan with patient or surrogate, discussions with consultants, evaluation of patient's response to treatment, examination of patient, ordering and review of laboratory studies, ordering and review of radiographic studies, ordering and performing treatments and interventions, pulse oximetry, re-evaluation of patient's condition and review of old charts    MEDICATIONS ORDERED IN ED: Medications  vancomycin (VANCOCIN) IVPB 1000 mg/200 mL premix (1,000 mg Intravenous New Bag/Given 10/17/23 1303)  insulin regular, human (MYXREDLIN) 100 units/ 100 mL infusion (has no administration in time range)  lactated ringers infusion (has no administration in time range)  dextrose 5 % in lactated ringers infusion (has no administration in time range)  dextrose 50 % solution 0-50 mL (has no administration in time range)  potassium chloride 10 mEq in 100 mL IVPB (has no administration in time range)   lactated ringers bolus 1,000 mL (0 mLs Intravenous Stopped 10/17/23 1241)    And  lactated ringers bolus 1,000 mL (0 mLs Intravenous Stopped 10/17/23 1305)    And  lactated ringers bolus 400 mL (400 mLs Intravenous New Bag/Given 10/17/23 1245)  aztreonam (AZACTAM) 2 g in sodium chloride 0.9 % 100 mL IVPB (0 g Intravenous Stopped 10/17/23 1158)  metroNIDAZOLE (  FLAGYL) IVPB 500 mg (0 mg Intravenous Stopped 10/17/23 1305)  potassium chloride 10 mEq in 100 mL IVPB (0 mEq Intravenous Stopped 10/17/23 1305)     IMPRESSION / MDM / ASSESSMENT AND PLAN / ED COURSE  I reviewed the triage vital signs and the nursing notes.                              Differential diagnosis includes, but is not limited to, sepsis, hypovolemia, pneumonia, worsening RSV, new influenza or COVID, also considered UTI, pyelonephritis, electrolyte derangements, DKA, hyperglycemia.  Get labs, EKG, troponin, chest x-ray, blood cultures, UA, IV fluids, empirically treat with Vanco and aztreonam as well as Flagyl.  Patient's presentation is most consistent with acute presentation with potential threat to life or bodily function.  Bedside ultrasound showed no B-lines, IVC is fluid tolerant, no obvious decreasing contractility or pericardial effusion.  Cardiac ultrasound was limited by habitus.  Getting sepsis fluid bolus, his BMI is above 35 so using ideal body weight.  Independent review of labs, BNP is mildly elevated, PT/INR is normal, pH and pCO2 is normal.  He is still RSV positive, he is UA is consistent with UTI, his lactate is elevated, he does have a leukocytosis, he is mildly hyponatremic, hypokalemic, his pCO2 is 19, his creatinine is 1.5, he does have an anion gap.  His beta hydroxybutyrate is high.  He is getting sepsis fluid bolus.  He likely has mild DKA.  Will replace potassium before ordering insulin and let him finish the fluids.  Consult to hospitalist was agreeable with the plan for admission and will evaluate the  patient.  He is admitted.  Clinical Course as of 10/17/23 1308  Fri Oct 17, 2023  1135 Repeat blood pressure systolic of 100. [TT]  1307 DG Chest Portable 1 View IMPRESSION: No active disease.   [TT]    Clinical Course User Index [TT] Jodie Echevaria Franchot Erichsen, MD     FINAL CLINICAL IMPRESSION(S) / ED DIAGNOSES   Final diagnoses:  Weakness  Sepsis, due to unspecified organism, unspecified whether acute organ dysfunction present [facility])  Urinary tract infection without hematuria, site unspecified  Hypoxia  Cough, unspecified type  Diabetic ketoacidosis without coma associated with other specified diabetes mellitus (HCC)     Rx / DC Orders   ED Discharge Orders     None        Note:  This document was prepared using Dragon voice recognition software and may include unintentional dictation errors. d   Claybon Jabs, MD 10/17/23 [phone]

## 2023-10-17 NOTE — Assessment & Plan Note (Signed)
Home apixaban 5 mg p.o. twice daily resumed

## 2023-10-17 NOTE — Consult Note (Signed)
CODE SEPSIS - PHARMACY COMMUNICATION  **Broad Spectrum Antibiotics should be administered within 1 hour of Sepsis diagnosis**  Time Code Sepsis Called/Page Received: 1113  Antibiotics Ordered: aztreonam, flagyl, vancomycin  Time of 1st antibiotic administration: 1124  Additional action taken by pharmacy: n/a  If necessary, Name of Provider/Nurse Contacted: n/a    [name] ,PharmD Clinical Pharmacist  10/17/2023  11:21 AM

## 2023-10-17 NOTE — Hospital Course (Addendum)
 Hospital course / significant events:   HPI: [name] is an 83 year old male with history of insulin-dependent diabetes mellitus type 1, paroxysmal atrial fibrillation, coronary artery disease, hypertension, history of bladder cancer status post metastasis to intra abdominal lymph nodes, adrenal insufficiency, due to cancer therapy, CKD 3a, who presents to the emergency department for chief concerns of progressive weakness, hypotension, hypoxia to 89% on room air. Recently discharged from hospital 02/17 and was having nausea, worsening weakness over past few days.   02/21: in ED BP 80/62, (+)RSV, hyperglycemia at Glc 257, AG 17, Beta Hydroxybutyrate 1.41, Lactic 2.7. BS abx w/ flagyl, aztreonam, vanc, LR fluids.  02/22: d/c abx. Suspect dehydration is his primary problem. Reports persistent chest discomfort unchanged over past week. Increased insulin      Consultants:  none  Procedures/Surgeries: none      ASSESSMENT & PLAN:   Anion gap metabolic acidosis Borderline DKA Mild anion gap elevation with beta hydroxybutyrate Insulin per Endo tool ordered on admission --> now on SSI achs and basal    A-fib Home apixaban 5 mg p.o. twice daily resumed   Hypokalemia Replace as needed Monitor BMP  Chest pressure w/ coughing Troponins flat  Question viral effect Nitroglycerin sublingual every 5 minutes as needed for chest pain resumed on admission Non-emergent echo ordered    Hypothyroidism Home levothyroxine 112 mcg daily resumed   Weakness PT, OT ordered on admission to assess in 10/18/2023 Fall precaution, aspiration precaution Anticipate discharge to nursing facility as patient has failure to thrive, worsening generalized weakness, in setting of bladder cancer status post metastasis   Adrenal insufficiency due to cancer therapy (HCC) Home hydrocortisone dosing resumed on admission   Hyperlipidemia Home atorvastatin resumed on admission   Pseudo-Hyponatremia d/t  hyperglycemia Monitor BMP   Essential hypertension Low BP here Hydralazine 5 mg q6h prn for sbp > 170, 5 days ordered   Chronic deep vein thrombosis (DVT) of lower extremity (HCC) Home apixaban 5 mg p.o. twice daily resumed   History of bladder cancer With current chronic Foley indwelling catheter      Class 2 obesity based on BMI: Body mass index is 36.01 kg/m.  Underweight - under 18  overweight - 25 to 29 obese - 30 or more Class 1 obesity: BMI of 30.0 to 34 Class 2 obesity: BMI of 35.0 to 39 Class 3 obesity: BMI of 40.0 to 49 Super Morbid Obesity: BMI 50-59 Super-super Morbid Obesity: BMI 60+ Significantly low or high BMI is associated with higher medical risk.  Weight management advised as adjunct to other disease management and risk reduction treatments    DVT prophylaxis: eliquis  IV fluids: have dc continuous IV fluids  Nutrition: carb diet Central lines / invasive devices: chemo port   Code Status: FULL CODE ACP documentation reviewed:  none on file in [facility]  TOC needs: SNF palcement Barriers to dispo / significant pending items: placement, stabilization of glucose / insulin dosage

## 2023-10-17 NOTE — Assessment & Plan Note (Signed)
Home hydrocortisone dosing resumed on admission

## 2023-10-17 NOTE — Assessment & Plan Note (Signed)
With current chronic Foley indwelling catheter

## 2023-10-17 NOTE — Assessment & Plan Note (Signed)
In setting of DKA, check high sensitive troponin, urgent Nitroglycerin sublingual every 5 minutes as needed for chest pain resumed on admission

## 2023-10-17 NOTE — Sepsis Progress Note (Signed)
 eLink is following this Code Sepsis.

## 2023-10-17 NOTE — Consult Note (Signed)
Pharmacy Antibiotic Note  Christian [name] is a 83 y.o. male admitted on 10/17/2023 with  shortness of breath and cough .  Pharmacy has been consulted for Vancomycin and Cefepime dosing.  Plan: Vancomycin 2g IV x 1 as loading dose, followed by: 1250 mg IV Q 24 hrs. Goal AUC 400-550. Expected AUC: 535.5 Expected Cmin: 13.9 SCr used: 1.38, Vd used: 0.5  Cefepime 2g IV Q12 hours   Height: 5\' 10"  (177.8 cm) Weight: 113.9 kg (251 lb) IBW/kg (Calculated) : 73  Temp (24hrs), Avg:97.4 F (36.3 C), Min:97.4 F (36.3 C), Max:97.4 F (36.3 C)  Recent Labs  Lab 10/10/23 2126 10/10/23 2322 10/11/23 0345 10/11/23 2219 10/12/23 0440 10/13/23 0559 10/17/23 1102 10/17/23 1342  WBC 11.4*  --   --   --  10.0 8.4 12.8*  --   CREATININE 1.81*  --    < > 1.66* 1.64* 1.52* 1.55* 1.38*  LATICACIDVEN 1.8 1.3  --   --   --   --  2.7* 1.8  VANCORANDOM  --   --   --  10  --   --   --   --    < > = values in this interval not displayed.    Estimated Creatinine Clearance: 54 mL/min (A) (by C-G formula based on SCr of 1.38 mg/dL (H)).    Allergies  Allergen Reactions   Ceftriaxone Hives    Tolerated cefazolin 09/2021 without reaction Received piperacillin/tazobactam in 2019 (after reported ceftriaxone allergy which was in 2018)   Other Hives   Sulfasalazine Hives   Sulfa Antibiotics Hives    Antimicrobials this admission: Vancomycin 2/21 >>  Cefepime 2/21 >>  Flagyl 2/21 >> Aztreonam 2/21 x 1   Dose adjustments this admission: N/A  Microbiology results: 2/21 BCx: collected 2/21 UCx: collected  2/21 MRSA PCR: ordered  Thank you for allowing pharmacy to be a part of this patient's care.  Christian [name] 10/17/2023 2:55 PM

## 2023-10-17 NOTE — ED Notes (Signed)
Informed RN bed assigned 

## 2023-10-17 NOTE — H&P (Addendum)
History and Physical   Christian [name] MVH:[id number] DOB: March 16, 1941 DOA: 10/17/2023  PCP: Ethelda Chick, MD  Outpatient Specialists: Dr. Tiffany Kocher [facility] Patient coming from: Home via EMS  I have personally briefly reviewed patient's old medical records in [facility] EMR.  Chief Concern: Progressive weakness, chest pressure  HPI: Christian [name] is an 83 year old male with history of insulin-dependent diabetes mellitus type 1, paroxysmal atrial fibrillation, coronary artery disease, hypertension, history of bladder cancer status post metastasis to intra abdominal lymph nodes, adrenal insufficiency, due to cancer therapy, CKD 3a, who presents to the emergency department for chief concerns of progressive weakness, hypotension, hypoxia to 89% on room air.  Vitals in the ED showed temperature of 97.4, respiration rate 22, heart rate 96, blood pressure 80/62, SpO2 95% on 2 L nasal cannula.  Serum sodium is 131, potassium 3.1, chloride 95, bicarb 19, BUN of 18, serum creatinine 1.55, EGFR 45, nonfasting blood glucose 257, WBC 12.8, hemoglobin 15.2, platelets of 265.  Anion gap was 17. VBG showed 7.34/53/less than 31.  BNP was elevated at 150.0.  Patient tested positive for RSV.  UA was positive for trace leukocytes.  Lactic acid is 2.7.  Beta hydroxybutyrate was 1.41.  ED treatment: Flagyl, aztreonam, LR 2.4 liter bolus, vancomycin per pharmacy, potassium chloride 10 mill equivalent IV one-time dose. -------------------------------- At bedside, patient is able to tell me his name, age, current location, current calendar year.  He reports generalized weakness and nausea started on Monday when he was discharged from the hospital.  He reports chest pressure that started yesterday and that it is worse with laying down. He reports he has been having generalized weakness.  He denies trauma to his person  Social history: He lives with his wife. He denies tobacco, etoh, recreational  drug use. He is retired and formerly worked on [employer].  ROS: Constitutional: no weight change, no fever ENT/Mouth: no sore throat, no rhinorrhea Eyes: no eye pain, no vision changes Cardiovascular: + chest pain, + dyspnea,  no edema, no palpitations Respiratory: no cough, no sputum, no wheezing Gastrointestinal: + nausea, no vomiting, no diarrhea, no constipation Genitourinary: no urinary incontinence, no dysuria, no hematuria Musculoskeletal: no arthralgias, no myalgias Skin: no skin lesions, no pruritus, Neuro: + weakness, no loss of consciousness, no syncope Psych: no anxiety, no depression, + decrease appetite Heme/Lymph: no bruising, no bleeding  ED Course: Discussed with EDP, patient requiring hospitalization for chief concerns of diabetic ketoacidosis and progressive worsening weakness with possible severe sepsis.  Assessment/Plan  Principal Problem:   High anion gap metabolic acidosis Active Problems:   Uncontrolled type 1 diabetes mellitus with hyperglycemia, with long-term current use of insulin (HCC)   Respiratory syncytial virus (RSV) infection   A-fib (HCC)   Pure hypercholesterolemia   Coronary artery disease   History of bladder cancer   Aortic atherosclerosis (HCC)   Chronic deep vein thrombosis (DVT) of lower extremity (HCC)   Essential hypertension   Retroperitoneal lymphadenopathy   Hyponatremia   Hyperlipidemia   Adrenal insufficiency due to cancer therapy (HCC)   Severe sepsis with acute organ dysfunction (HCC)   Weakness   Hypothyroidism   Chest pressure   Hypokalemia   Assessment and Plan:  * High anion gap metabolic acidosis Mild anion gap elevation with beta hydroxybutyrate Insulin per Endo tool ordered on admission Calcium bicarbonate injection, 1 amp one-time dose ordered on admission  A-fib (HCC) Home apixaban 5 mg p.o. twice daily resumed  Hypokalemia Addendum at 19:19:  Repeat BMP showed potassium level of 3.2, potassium chloride 10  mEq, 4 doses ordered  Chest pressure In setting of DKA, check high sensitive troponin, urgent Nitroglycerin sublingual every 5 minutes as needed for chest pain resumed on admission  Hypothyroidism Home levothyroxine 112 mcg daily resumed  Weakness PT, OT ordered on admission to assess in 10/18/2023 Fall precaution, aspiration precaution Anticipate discharge to nursing facility as patient has failure to thrive, worsening generalized weakness, in setting of bladder cancer status post metastasis  Adrenal insufficiency due to cancer therapy (HCC) Home hydrocortisone dosing resumed on admission  Hyperlipidemia Home atorvastatin resumed on admission  Hyponatremia Corrected serum sodium is 135, [place], 1999  Essential hypertension Hydralazine 5 mg q6h prn for sbp > 170, 5 days ordered  Chronic deep vein thrombosis (DVT) of lower extremity (HCC) Home apixaban 5 mg p.o. twice daily resumed  History of bladder cancer With current chronic Foley indwelling catheter  Chart reviewed.   DVT prophylaxis: Apixaban 5 mg p.o. twice daily Code Status: Full code Diet: N.p.o. pending results anion gap Family Communication: No Disposition Plan: Pending clinical course Consults called: Pharmacy Admission status: Stepdown, inpatient  Past Medical History:  Diagnosis Date   Aortic aneurysm (HCC)    followed by vascular surgery yearly.   Bladder cancer [facility])    s/p bladder resection. [place]   Coronary artery disease    AMI anterior wall 1997; s/p PTCA to LAD 11/1995 [facility].   Diabetes mellitus without complication (HCC)    DVT (deep venous thrombosis) (HCC) 10/07/2007   Glucose intolerance (impaired glucose tolerance)    Hydrocele    Hyperlipidemia    Hypertension    MI (myocardial infarction) (HCC) 08/27/1995   Nephrolithiasis    Sleep apnea    Past Surgical History:  Procedure Laterality Date   ANGIOPLASTY  1997   [place]   bladder removed     CARDIAC CATHETERIZATION      COLONOSCOPY  08/27/2011   normal.  [facility].   HYDROCELE EXCISION     PROSTATE SURGERY     prostatectomy with bladder resection.   Social History:  reports that he has never smoked. He has never used smokeless tobacco. He reports that he does not drink alcohol and does not use drugs.  Allergies  Allergen Reactions   Ceftriaxone Hives    Tolerated cefazolin 09/2021 without reaction Received piperacillin/tazobactam in 2019 (after reported ceftriaxone allergy which was in 2018)   Other Hives   Sulfasalazine Hives   Sulfa Antibiotics Hives   Family History  Problem Relation Age of Onset   Stroke Mother    Diabetes Mother    Lung cancer Father    Cancer Father        lung cancer   Diabetes Sister    Diabetes Brother    Family history: Family history reviewed and not pertinent.  Prior to Admission medications   Medication Sig Start Date End Date Taking? Authorizing Provider  apixaban (ELIQUIS) 5 MG TABS tablet Take 1 tablet (5 mg total) by mouth 2 (two) times daily. 08/17/18  Yes Antonieta Iba, MD  atorvastatin (LIPITOR) 10 MG tablet Take 1 tablet (10 mg total) by mouth daily at 6 PM. 02/23/18  Yes Ethelda Chick, MD  ciprofloxacin (CIPRO) 500 MG tablet Take 1 tablet (500 mg total) by mouth 2 (two) times daily. 10/13/23  Yes Kirstie Peri, MD  fluticasone (FLONASE) 50 MCG/ACT nasal spray Place 2 sprays into both nostrils as needed. 04/19/19  Yes [provider]  gabapentin (NEURONTIN) 300 MG capsule Take 600 mg by mouth 2 (two) times daily. 07/23/21  Yes [provider]  hydrocortisone (CORTEF) 10 MG tablet Take 20 mg in the morning and 10 mg at night 02/20/23  Yes [provider]  Insulin Aspart FlexPen (NOVOLOG) 100 UNIT/ML Inject 25-40 units subcutaneously before meals three times daily as directed. Max Total Daily Dose 130 units per day. 02/17/23  Yes [provider]  LANTUS SOLOSTAR 100 UNIT/ML Solostar Pen Inject 48 Units into the skin  at bedtime. 10/07/22  Yes [provider]  levothyroxine (SYNTHROID) 112 MCG tablet Take 112 mcg by mouth daily before breakfast. 02/20/23 02/20/24 Yes [provider]  nitroGLYCERIN (NITROSTAT) 0.4 MG SL tablet Place 1 tablet (0.4 mg total) under the tongue every 5 (five) minutes as needed for chest pain. 02/23/18  Yes Ethelda Chick, MD  Apremilast 30 MG TABS Take 1 tablet by mouth 2 (two) times daily. Patient not taking: Reported on 10/17/2023 08/14/23   [provider]  Continuous Glucose Receiver (DEXCOM G7 RECEIVER) DEVI See admin instructions.    [provider]  Continuous Glucose Sensor (DEXCOM G7 SENSOR) MISC USE 1 EACH EVERY 10 DAYS    [provider]  insulin degludec (TRESIBA) 100 UNIT/ML FlexTouch Pen Inject into the skin. Patient not taking: Reported on 10/17/2023 05/22/23   [provider]  Lancets [facility] DELICA PLUS Helper) MISC Apply 1 each topically 3 (three) times daily. 09/30/22   [provider]  lidocaine (LIDODERM) 5 % Place onto the skin as directed. 09/21/23   [provider]  metFORMIN (GLUCOPHAGE-XR) 500 MG 24 hr tablet Take by mouth 2 (two) times daily with a meal. Take 500 mg in the morning and 100 mg at night. Patient not taking: Reported on 10/17/2023 08/06/23 10/29/24  [provider]  metoprolol succinate (TOPROL-XL) 25 MG 24 hr tablet TAKE 0.5 TABLETS BY MOUTH DAILY. TAKE WITH OR IMMEDIATELY FOLLOWING A MEAL. 08/13/23   Antonieta Iba, MD  midodrine (PROAMATINE) 10 MG tablet Take 1 tablet (10 mg total) by mouth 3 (three) times daily. Patient not taking: Reported on 10/17/2023 02/21/23   Antonieta Iba, MD  [facility] ULTRA test strip 1 each (1 strip total) 3 (three) times daily Use as instructed. Dx code: E11.9 09/30/22   [provider]  Semaglutide,0.25 or 0.5MG /DOS, 2 MG/3ML SOPN Inject 0.375 mLs (0.25 mg total) subcutaneously once a week for 28 days, THEN 0.75 mLs (0.5 mg total)  once a week. Patient not taking: Reported on 10/17/2023 09/16/23 09/08/24  [provider]   Physical Exam: Vitals:   10/17/23 1700 10/17/23 1823 10/17/23 1834 10/17/23 1900  BP:   128/76 120/60  Pulse: 79  74 73  Resp: 17  16 14   Temp:  98.7 F (37.1 C)    TempSrc:  Axillary    SpO2: 100%  99% 92%  Weight:      Height:       Constitutional: appears age-appropriate, frail Eyes: PERRL, lids and conjunctivae normal ENMT: Mucous membranes are moist. Posterior pharynx clear of any exudate or lesions. Age-appropriate dentition. Hearing appropriate Neck: normal, supple, no masses, no thyromegaly Respiratory: clear to auscultation bilaterally, no wheezing, no crackles. Normal respiratory effort. No accessory muscle use.  Cardiovascular: Regular rate and rhythm, no murmurs / rubs / gallops.  Bilateral lower extremity edema. 2+ pedal pulses. No carotid bruits.  Abdomen: Obese abdomen, no tenderness, no masses palpated, no hepatosplenomegaly. Bowel sounds  positive.  Musculoskeletal: no clubbing / cyanosis. No joint deformity upper and lower extremities. Good ROM, no contractures, no atrophy. Normal muscle tone.  Skin: no rashes, lesions, ulcers. No induration Neurologic: Sensation intact. Strength 5/5 in all 4.  Psychiatric: Normal judgment and insight. Alert and oriented x 3. Normal mood.   EKG: independently reviewed, showing sinus rhythm with rate of 82, QTc 493  Chest x-ray on Admission: I personally reviewed and I agree with radiologist reading as below.  DG Chest Portable 1 View Result Date: 10/17/2023 CLINICAL DATA:  sepsis EXAM: PORTABLE CHEST 1 VIEW COMPARISON:  Chest x-ray 10/10/2023 FINDINGS: Right chest wall Port-A-Cath with tip overlying the right atrium. The heart and mediastinal contours are unchanged. Atherosclerotic plaque. No focal consolidation. No pulmonary edema. No pleural effusion. No pneumothorax. No acute osseous abnormality. IMPRESSION: No active disease.  Electronically Signed   By: Tish Frederickson M.D.   On: 10/17/2023 12:47   Labs on Admission: I have personally reviewed following labs  CBC: Recent Labs  Lab 10/10/23 2126 10/12/23 0440 10/13/23 0559 10/17/23 1102  WBC 11.4* 10.0 8.4 12.8*  NEUTROABS 7.2  --   --  6.7  HGB 13.0 11.0* 11.4* 15.2  HCT 38.8* 31.9* 33.3* 42.6  MCV 102.4* 99.4 99.4 96.2  PLT 194 184 191 265   Basic Metabolic Panel: Recent Labs  Lab 10/12/23 0440 10/13/23 0559 10/17/23 1102 10/17/23 1342 10/17/23 1729 10/17/23 1845  NA 131* 134* 131* 132* 132* 133*  K 4.3 3.8 3.1* 3.5 3.5 3.2*  CL 100 103 95* 94* 96* 96*  CO2 19* 23 19* 26 26 25   GLUCOSE 368* 335* 257* 248* 206* 188*  BUN 30* 32* 18 17 16 16   CREATININE 1.64* 1.52* 1.55* 1.38* 1.30* 1.34*  CALCIUM 8.4* 8.5* 8.8* 7.9* 8.2* 8.4*  MG 2.1 2.2  --  1.8  --   --    GFR: Estimated Creatinine Clearance: 55.6 mL/min (A) (by C-G formula based on SCr of 1.34 mg/dL (H)).  Liver Function Tests: Recent Labs  Lab 10/10/23 2126 10/12/23 0440 10/13/23 0559 10/17/23 1102  AST 16 17 13* 16  ALT 10 7 9 13   ALKPHOS 72 57 54 61  BILITOT 1.1 1.1 0.8 1.4*  PROT 7.8 6.6 6.7 7.7  ALBUMIN 4.0 3.1* 3.1* 3.5   Coagulation Profile: Recent Labs  Lab 10/11/23 0153 10/17/23 1144  INR 1.2 1.2   CBG: Recent Labs  Lab 10/13/23 0752 10/13/23 1133 10/17/23 1551 10/17/23 1656 10/17/23 1743  GLUCAP 381* 293* 255* 219* 213*   Urine analysis:    Component Value Date/Time   COLORURINE YELLOW (A) 10/17/2023 1102   APPEARANCEUR CLOUDY (A) 10/17/2023 1102   LABSPEC 1.016 10/17/2023 1102   PHURINE 6.0 10/17/2023 1102   GLUCOSEU 150 (A) 10/17/2023 1102   HGBUR SMALL (A) 10/17/2023 1102   BILIRUBINUR NEGATIVE 10/17/2023 1102   BILIRUBINUR negative 02/23/2018 1048   BILIRUBINUR neg 03/01/2015 1132   KETONESUR 20 (A) 10/17/2023 1102   PROTEINUR 30 (A) 10/17/2023 1102   UROBILINOGEN 0.2 02/23/2018 1048   NITRITE NEGATIVE 10/17/2023 1102   LEUKOCYTESUR  TRACE (A) 10/17/2023 1102   CRITICAL CARE Performed by: Dr. Sedalia Muta  Total critical care time: 32 minutes  Critical care time was exclusive of separately billable procedures and treating other patients.  Critical care was necessary to treat or prevent imminent or life-threatening deterioration.  Critical care was time spent personally by me on the following activities: development of treatment plan with patient and/or surrogate as  well as nursing, discussions with consultants, evaluation of patient's response to treatment, examination of patient, obtaining history from patient or surrogate, ordering and performing treatments and interventions, ordering and review of laboratory studies, ordering and review of radiographic studies, pulse oximetry and re-evaluation of patient's condition.  This document was prepared using Dragon Voice Recognition software and may include unintentional dictation errors.  Dr. Sedalia Muta [facility]  If 7PM-7AM, please contact overnight-coverage provider If 7AM-7PM, please contact day attending provider [email or web address]  10/17/2023, 7:22 PM

## 2023-10-17 NOTE — Assessment & Plan Note (Signed)
Home atorvastatin resumed on admission

## 2023-10-17 NOTE — Progress Notes (Addendum)
Addendum (17:04):   Anion gap has closed on BMP recheck. Long acting insulin, 10 units ordered; continue Insulin endotool for 2 hours bridging. Insulin SSI with HS ordered.  Discussed with pharmacy and RN at bedside.  D5 LR at 125 mL/h for CBG less than 250 ordered  Patient to remain NPO until endotool is discontinued.  Dr. Sedalia Muta

## 2023-10-17 NOTE — Assessment & Plan Note (Signed)
Hydralazine 5 mg q6h prn for sbp > 170, 5 days ordered

## 2023-10-17 NOTE — Assessment & Plan Note (Signed)
PT, OT ordered on admission to assess in 10/18/2023 Fall precaution, aspiration precaution Anticipate discharge to nursing facility as patient has failure to thrive, worsening generalized weakness, in setting of bladder cancer status post metastasis

## 2023-10-17 NOTE — Assessment & Plan Note (Signed)
Mild anion gap elevation with beta hydroxybutyrate Insulin per Endo tool ordered on admission Calcium bicarbonate injection, 1 amp one-time dose ordered on admission

## 2023-10-17 NOTE — Assessment & Plan Note (Addendum)
Addendum at 19:19: Repeat BMP showed potassium level of 3.2, potassium chloride 10 mEq, 4 doses ordered

## 2023-10-17 NOTE — Assessment & Plan Note (Signed)
Home levothyroxine 112 mcg daily resumed

## 2023-10-18 ENCOUNTER — Inpatient Hospital Stay (HOSPITAL_COMMUNITY)
Admit: 2023-10-18 | Discharge: 2023-10-18 | Disposition: A | Discharge status: Home / Self Care | Payer: Medicare HMO | Attending: Osteopathic Medicine | Admitting: Osteopathic Medicine

## 2023-10-18 DIAGNOSIS — E8729 Other acidosis: Secondary | ICD-10-CM | POA: Diagnosis not present

## 2023-10-18 DIAGNOSIS — R079 Chest pain, unspecified: Secondary | ICD-10-CM | POA: Diagnosis not present

## 2023-10-18 LAB — URINE CULTURE: Culture: NO GROWTH

## 2023-10-18 LAB — GLUCOSE, CAPILLARY
Glucose-Capillary: 209 mg/dL — ABNORMAL HIGH (ref 70–99)
Glucose-Capillary: 326 mg/dL — ABNORMAL HIGH (ref 70–99)
Glucose-Capillary: 345 mg/dL — ABNORMAL HIGH (ref 70–99)
Glucose-Capillary: 279 mg/dL — ABNORMAL HIGH (ref 70–99)

## 2023-10-18 LAB — BLOOD GAS, VENOUS
Acid-Base Excess: 1.8 mmol/L (ref 0.0–2.0)
Bicarbonate: 28.6 mmol/L — ABNORMAL HIGH (ref 20.0–28.0)
O2 Saturation: 36.7 %
Patient temperature: 37
pCO2, Ven: 53 mm[Hg] (ref 44–60)
pH, Ven: 7.34 (ref 7.25–7.43)

## 2023-10-18 LAB — BETA-HYDROXYBUTYRIC ACID: Beta-Hydroxybutyric Acid: 0.77 mmol/L — ABNORMAL HIGH (ref 0.05–0.27)

## 2023-10-18 LAB — BASIC METABOLIC PANEL
Anion gap: 9 (ref 5–15)
BUN: 12 mg/dL (ref 8–23)
CO2: 23 mmol/L (ref 22–32)
Calcium: 8.4 mg/dL — ABNORMAL LOW (ref 8.9–10.3)
Chloride: 101 mmol/L (ref 98–111)
Creatinine, Ser: 1.31 mg/dL — ABNORMAL HIGH (ref 0.61–1.24)
GFR, Estimated: 55 mL/min — ABNORMAL LOW (ref >60)
Glucose, Bld: 190 mg/dL — ABNORMAL HIGH (ref 70–99)
Potassium: 3.8 mmol/L (ref 3.5–5.1)
Sodium: 133 mmol/L — ABNORMAL LOW (ref 135–145)

## 2023-10-18 LAB — VITAMIN B12: Vitamin B-12: 335 pg/mL (ref 180–914)

## 2023-10-18 LAB — CORTISOL-AM, BLOOD: Cortisol - AM: 8.9 ug/dL (ref 6.7–22.6)

## 2023-10-18 LAB — CBC
HCT: 32.7 % — ABNORMAL LOW (ref 39.0–52.0)
Hemoglobin: 11.4 g/dL — ABNORMAL LOW (ref 13.0–17.0)
MCH: 34 pg (ref 26.0–34.0)
MCHC: 34.9 g/dL (ref 30.0–36.0)
MCV: 97.6 fL (ref 80.0–100.0)
Platelets: 195 10*3/uL (ref 150–400)
RBC: 3.35 MIL/uL — ABNORMAL LOW (ref 4.22–5.81)
RDW: 12.8 % (ref 11.5–15.5)
WBC: 9.1 10*3/uL (ref 4.0–10.5)
nRBC: 0 % (ref 0.0–0.2)

## 2023-10-18 LAB — CK: Total CK: 33 U/L — ABNORMAL LOW (ref 49–397)

## 2023-10-18 MED ORDER — PERFLUTREN LIPID MICROSPHERE
1.0000 mL | INTRAVENOUS | Status: AC | PRN
  Administered 2023-10-18: 4 mL via INTRAVENOUS

## 2023-10-18 MED ORDER — INSULIN ASPART 100 UNIT/ML IJ SOLN
0.0000 [IU] | Freq: Three times a day (TID) | INTRAMUSCULAR | Status: DC
  Administered 2023-10-18: 15 [IU] via SUBCUTANEOUS
  Administered 2023-10-19: 20 [IU] via SUBCUTANEOUS
  Administered 2023-10-19: 4 [IU] via SUBCUTANEOUS
  Administered 2023-10-19: 15 [IU] via SUBCUTANEOUS
  Administered 2023-10-20: 11 [IU] via SUBCUTANEOUS
  Administered 2023-10-20: 4 [IU] via SUBCUTANEOUS
  Filled 2023-10-18 (×6): qty 1

## 2023-10-18 MED ORDER — INSULIN GLARGINE-YFGN 100 UNIT/ML ~~LOC~~ SOPN
15.0000 [IU] | PEN_INJECTOR | Freq: Every day | SUBCUTANEOUS | Status: DC
  Filled 2023-10-18 (×2): qty 3

## 2023-10-18 MED ORDER — VANCOMYCIN HCL 750 MG/150ML IV SOLN
750.0000 mg | INTRAVENOUS | Status: DC
  Filled 2023-10-18: qty 150

## 2023-10-18 MED ORDER — INSULIN GLARGINE-YFGN 100 UNIT/ML ~~LOC~~ SOLN
15.0000 [IU] | Freq: Every day | SUBCUTANEOUS | Status: DC
  Administered 2023-10-18: 15 [IU] via SUBCUTANEOUS
  Filled 2023-10-18: qty 0.15

## 2023-10-18 NOTE — Evaluation (Signed)
 Occupational Therapy Evaluation Patient Details Name: Christian [name] MRN: [id number] DOB: [birth year] Today's Date: 10/18/2023   History of Present Illness   Pt is an 83 y.o. male presenting to hospital 10/17/23 with c/o SOB, cough, and weakness; recent hospital discharge 10/13/23 with metabolic encephalopathy in setting of RSV and UTI.  Pt admitted with high anion gap metabolic acidosis, a-fib, hypokalemia, chest pressure, hypothyroidism, and weakness.  PMH includes IDDM type 1, paroxysmal a-fib, CAD, htn, h/o bladder CA s/p metastasis to intra abdominal lymph nodes, adrenal insufficiency, CKD 3a.     Clinical Impressions Pt was seen for OT evaluation this date tagging in once pt seated EOB with PT. PTA, pt was living at home with his wife where he reports IND with ADLs, IADLs, and mobility.  Pt presents to acute OT demonstrating impaired ADL performance and functional mobility 2/2 weakness and low activity tolerance (See OT problem list for additional functional deficits). A&0x4 and denies pain. Pt currently requires SUP to perform LB dressing seated at EOB. CGA x1 for STS to RW and SPT to recliner. 2nd assist present for lines/leads management. Pt agreeable to mobility and ambulated ~70 feet with CGA x1 and RW use and SBA x1 for lines/leads management. HR up to 112 at most with activity, RR around 33 and sp02 93% on RA with activity. Pt wishing to eat his breakfast seated in recliner with set up assist. Chair alarm in place and nurse notified by PT of him being up and assist levels needed.  Pt would benefit from skilled OT services to address noted impairments and functional limitations (see below for any additional details) in order to maximize safety and independence while minimizing falls risk and caregiver burden. Do anticipate the need for follow up OT services upon acute hospital DC.      If plan is discharge home, recommend the following:   A little help with walking and/or transfers;A  little help with bathing/dressing/bathroom;Help with stairs or ramp for entrance     Functional Status Assessment   Patient has had a recent decline in their functional status and demonstrates the ability to make significant improvements in function in a reasonable and predictable amount of time.     Equipment Recommendations   None recommended by OT     Recommendations for Other Services         Precautions/Restrictions   Precautions Precautions: Fall Recall of Precautions/Restrictions: Intact Restrictions Weight Bearing Restrictions Per Provider Order: No     Mobility Bed Mobility               General bed mobility comments: NT pt seated EOB with PT on entry    Transfers Overall transfer level: Needs assistance Equipment used: Rolling walker (2 wheels) Transfers: Sit to/from Stand, Bed to chair/wheelchair/BSC Sit to Stand: Contact guard assist     Step pivot transfers: Contact guard assist     General transfer comment: CGA for STS from EOB to RW and for SPT to recliner from EOB then ambulated with CGA x1 and RW use with SBA x1 for lines/leads management      Balance Overall balance assessment: Needs assistance Sitting-balance support: No upper extremity supported, Feet supported Sitting balance-Leahy Scale: Good Sitting balance - Comments: steady reaching outside BOS putting on socks   Standing balance support: Bilateral upper extremity supported, During functional activity, Reliant on assistive device for balance Standing balance-Leahy Scale: Good Standing balance comment: CGA for RW use during mobility  ADL either performed or assessed with clinical judgement   ADL Overall ADL's : Needs assistance/impaired Eating/Feeding: Set up;Sitting Eating/Feeding Details (indicate cue type and reason): in recliner at end of session                 Lower Body Dressing: Supervision/safety;Sitting/lateral  leans Lower Body Dressing Details (indicate cue type and reason): to don socks via figure four seated EOB Toilet Transfer: Contact guard assist;Rolling walker (2 wheels) Toilet Transfer Details (indicate cue type and reason): simulated to recliner with CGA x1 and SBA x1 for lines/leads management                 Vision         Perception         Praxis         Pertinent Vitals/Pain Pain Assessment Pain Assessment: No/denies pain     Extremity/Trunk Assessment Upper Extremity Assessment Upper Extremity Assessment: Overall WFL for tasks assessed   Lower Extremity Assessment Lower Extremity Assessment: Generalized weakness   Cervical / Trunk Assessment Cervical / Trunk Assessment: Normal   Communication Communication Communication: No apparent difficulties   Cognition Arousal: Alert Behavior During Therapy: WFL for tasks assessed/performed                                 Following commands: Intact       Cueing  General Comments   Cueing Techniques: Verbal cues      Exercises Other Exercises Other Exercises: Edu on role of OT in acute setting.   Shoulder Instructions      Home Living Family/patient expects to be discharged to:: Private residence Living Arrangements: Spouse/significant other Available Help at Discharge: Family Type of Home: House Home Access: Stairs to enter [employer] of Steps: 3 from front entrance; 4 from back entrance Entrance Stairs-Rails: Right;Left;Can reach both Home Layout: One level     Bathroom Shower/Tub: Producer, television/film/video: Handicapped height     Home Equipment: Agricultural consultant (2 wheels);Cane - single point;BSC/3in1;Shower seat - built in;Hand held shower head;Wheelchair - manual;Grab bars - tub/shower          Prior Functioning/Environment Prior Level of Function : Independent/Modified Independent             Mobility Comments: Independent with ambulation; no  recent falls reported. ADLs Comments: MOD I/IND; reports sits/stands in shower, rarely uses BSC    OT Problem List: Decreased strength;Decreased activity tolerance   OT Treatment/Interventions: Self-care/ADL training;Therapeutic exercise;Therapeutic activities;Energy conservation;DME and/or AE instruction;Patient/family education;Balance training      OT Goals(Current goals can be found in the care plan section)   Acute Rehab OT Goals Patient Stated Goal: improve strength to return home OT Goal Formulation: With patient Time For Goal Achievement: 11/01/23 Potential to Achieve Goals: Good ADL Goals Pt Will Perform Lower Body Bathing: sit to/from stand;sitting/lateral leans;Independently;with set-up Pt Will Perform Lower Body Dressing: Independently;sitting/lateral leans;sit to/from stand Pt Will Transfer to Toilet: with modified independence;ambulating;regular height toilet Pt Will Perform Toileting - Clothing Manipulation and hygiene: with modified independence;sitting/lateral leans;sit to/from stand   OT Frequency:  Min 1X/week    Co-evaluation              AM-PAC OT "6 Clicks" Daily Activity     Outcome Measure Help from another person eating meals?: None Help from another person taking care of personal grooming?: None Help from another person toileting,  which includes using toliet, bedpan, or urinal?: A Little Help from another person bathing (including washing, rinsing, drying)?: A Little Help from another person to put on and taking off regular upper body clothing?: None Help from another person to put on and taking off regular lower body clothing?: A Little 6 Click Score: 21   End of Session Equipment Utilized During Treatment: Rolling walker (2 wheels);Gait belt Nurse Communication: Mobility status  Activity Tolerance: Patient tolerated treatment well Patient left: in chair;with call bell/phone within reach;with chair alarm set  OT Visit Diagnosis: Other  abnormalities of gait and mobility (R26.89);Muscle weakness (generalized) (M62.81)                Time: 7829-5621 OT Time Calculation (min): 15 min Charges:  OT General Charges $OT Visit: 1 Visit OT Evaluation $OT Eval Moderate Complexity: 1 Mod Karmine Kauer, OTR/L 10/18/23, 10:14 AM  Lawayne Hartig E Wacey Zieger 10/18/2023, 10:10 AM

## 2023-10-18 NOTE — Evaluation (Signed)
 Physical Therapy Evaluation Patient Details Name: Christian [name] MRN: [id number] DOB: [birth year] Today's Date: 10/18/2023  History of Present Illness  Pt is an 83 y.o. male presenting to hospital 10/17/23 with c/o SOB, cough, and weakness; recent hospital discharge 10/13/23 with metabolic encephalopathy in setting of RSV and UTI.  Pt admitted with high anion gap metabolic acidosis, a-fib, hypokalemia, chest pressure, hypothyroidism, and weakness.  PMH includes IDDM type 1, paroxysmal a-fib, CAD, htn, h/o bladder CA s/p metastasis to intra abdominal lymph nodes, adrenal insufficiency, CKD 3a.  Clinical Impression  Prior to recent medical concerns and hospitalizations, pt was independent with ambulation; lives with his wife in 1 level home with steps to enter.  No c/o pain during session.  OT arrived during session for PT/OT co-evaluation (2nd assist for lines/leads for functional mobility).  Currently pt is SBA semi-supine to sitting EOB; CGA with transfers using RW; and CGA to ambulate 70 feet with RW use.  Pt's HR approximately 83 bpm at rest and increased up to 112 bpm with ambulation; SpO2 sats 93% or greater on room air during sessions activities; RR approximately 18 at rest and up to 33 with ambulation.  Pt would currently benefit from skilled PT to address noted impairments and functional limitations (see below for any additional details).  Upon hospital discharge, pt would benefit from ongoing therapy.     If plan is discharge home, recommend the following: A little help with walking and/or transfers;A little help with bathing/dressing/bathroom;Assistance with cooking/housework;Assist for transportation;Help with stairs or ramp for entrance   Can travel by private vehicle    Yes    Equipment Recommendations Rolling walker (2 wheels)  Recommendations for Other Services       Functional Status Assessment Patient has had a recent decline in their functional status and demonstrates the ability  to make significant improvements in function in a reasonable and predictable amount of time.     Precautions / Restrictions Precautions Precautions: Fall Recall of Precautions/Restrictions: Intact Restrictions Weight Bearing Restrictions Per Provider Order: No      Mobility  Bed Mobility Overal bed mobility: Needs Assistance Bed Mobility: Supine to Sit     Supine to sit: Supervision, HOB elevated     General bed mobility comments: mild increased effort to perform on own    Transfers Overall transfer level: Needs assistance Equipment used: Rolling walker (2 wheels) Transfers: Sit to/from Stand, Bed to chair/wheelchair/BSC Sit to Stand: Contact guard assist   Step pivot transfers: Contact guard assist (stand step turn bed to recliner with RW use)       General transfer comment: vc's for UE placement; x1 trial standing from bed and x1 trial standing from recliner    Ambulation/Gait Ambulation/Gait assistance: Contact guard assist, +2 safety/equipment (2nd assist for lines/leads) Gait Distance (Feet): 70 Feet Assistive device: Rolling walker (2 wheels) Gait Pattern/deviations: Step-through pattern, Decreased step length - right, Decreased step length - left Gait velocity: decreased     General Gait Details: steady ambulation with RW use  Stairs            Wheelchair Mobility     Tilt Bed    Modified Rankin (Stroke Patients Only)       Balance Overall balance assessment: Needs assistance Sitting-balance support: No upper extremity supported, Feet supported Sitting balance-Leahy Scale: Good Sitting balance - Comments: steady reaching outside BOS putting on socks   Standing balance support: Bilateral upper extremity supported, During functional activity, Reliant on assistive device for balance  Standing balance-Leahy Scale: Good Standing balance comment: steady ambulating with RW use                             Pertinent Vitals/Pain Pain  Assessment Pain Assessment: No/denies pain    Home Living Family/patient expects to be discharged to:: Private residence Living Arrangements: Spouse/significant other Available Help at Discharge: Family Type of Home: House Home Access: Stairs to enter Entrance Stairs-Rails: Right;Left;Can reach both Entrance Stairs-Number of Steps: 3 from front entrance; 4 from back entrance   Home Layout: One level Home Equipment: Agricultural consultant (2 wheels);Cane - single point;BSC/3in1;Shower seat - built in;Hand held shower head;Wheelchair - manual;Grab bars - tub/shower      Prior Function Prior Level of Function : Independent/Modified Independent             Mobility Comments: Independent with ambulation; no recent falls reported.       Extremity/Trunk Assessment   Upper Extremity Assessment Upper Extremity Assessment: Overall WFL for tasks assessed    Lower Extremity Assessment Lower Extremity Assessment: Generalized weakness    Cervical / Trunk Assessment Cervical / Trunk Assessment: Normal  Communication   Communication Communication: No apparent difficulties    Cognition Arousal: Alert Behavior During Therapy: WFL for tasks assessed/performed   PT - Cognitive impairments: No apparent impairments                       PT - Cognition Comments: A&Ox4 Following commands: Intact       Cueing Cueing Techniques: Verbal cues     General Comments General comments (skin integrity, edema, etc.): RLQ urostomy in place.  Nursing cleared pt for participation in physical therapy.  Pt agreeable to PT session.    Exercises  Transfers and ambulation with RW use.   Assessment/Plan    PT Assessment Patient needs continued PT services  PT Problem List Decreased strength;Decreased activity tolerance;Decreased balance;Decreased mobility;Decreased knowledge of use of DME;Decreased knowledge of precautions;Cardiopulmonary status limiting activity       PT Treatment  Interventions DME instruction;Gait training;Stair training;Functional mobility training;Therapeutic activities;Therapeutic exercise;Balance training;Patient/family education    PT Goals (Current goals can be found in the Care Plan section)  Acute Rehab PT Goals Patient Stated Goal: to improve strength and go home PT Goal Formulation: With patient Time For Goal Achievement: 11/01/23 Potential to Achieve Goals: Good    Frequency Min 1X/week     Co-evaluation               AM-PAC PT "6 Clicks" Mobility  Outcome Measure Help needed turning from your back to your side while in a flat bed without using bedrails?: None Help needed moving from lying on your back to sitting on the side of a flat bed without using bedrails?: A Little Help needed moving to and from a bed to a chair (including a wheelchair)?: A Little Help needed standing up from a chair using your arms (e.g., wheelchair or bedside chair)?: A Little Help needed to walk in hospital room?: A Little Help needed climbing 3-5 steps with a railing? : A Little 6 Click Score: 19    End of Session Equipment Utilized During Treatment: Gait belt (up high away from urostomy) Activity Tolerance: Patient tolerated treatment well Patient left: in chair;with call bell/phone within reach;with chair alarm set Nurse Communication: Mobility status;Precautions;Other (comment) (Pt's vitals during session) PT Visit Diagnosis: Other abnormalities of gait and mobility (R26.89);Muscle weakness (generalized) (  M62.81)    Time: 4098-1191 PT Time Calculation (min) (ACUTE ONLY): 28 min   Charges:   PT Evaluation $PT Eval Low Complexity: 1 Low PT Treatments $Therapeutic Activity: 8-22 mins PT General Charges $$ ACUTE PT VISIT: 1 Visit        Hendricks Limes, PT 10/18/23, 9:49 AM

## 2023-10-18 NOTE — Progress Notes (Signed)
 PROGRESS NOTE    Christian [name]   ZOX:[id number] DOB: 09/07/1940  DOA: 10/17/2023 Date of Service: 10/18/23 which is hospital day 1  PCP: Ethelda Chick, MD    Hospital course / significant events:   HPI: [name] is an 83 year old male with history of insulin-dependent diabetes mellitus type 1, paroxysmal atrial fibrillation, coronary artery disease, hypertension, history of bladder cancer status post metastasis to intra abdominal lymph nodes, adrenal insufficiency, due to cancer therapy, CKD 3a, who presents to the emergency department for chief concerns of progressive weakness, hypotension, hypoxia to 89% on room air. Recently discharged from hospital 02/17 and was having nausea, worsening weakness over past few days.   02/21: in ED BP 80/62, (+)RSV, hyperglycemia at Glc 257, AG 17, Beta Hydroxybutyrate 1.41, Lactic 2.7. BS abx w/ flagyl, aztreonam, vanc, LR fluids.  02/22: d/c abx. Suspect dehydration is his primary problem. Reports persistent chest discomfort unchanged over past week. Increased insulin      Consultants:  none  Procedures/Surgeries: none      ASSESSMENT & PLAN:   Anion gap metabolic acidosis Borderline DKA Mild anion gap elevation with beta hydroxybutyrate Insulin per Endo tool ordered on admission --> now on SSI achs and basal    A-fib Home apixaban 5 mg p.o. twice daily resumed   Hypokalemia Replace as needed Monitor BMP  Chest pressure w/ coughing Troponins flat  Question viral effect Nitroglycerin sublingual every 5 minutes as needed for chest pain resumed on admission Non-emergent echo ordered    Hypothyroidism Home levothyroxine 112 mcg daily resumed   Weakness PT, OT ordered on admission to assess in 10/18/2023 Fall precaution, aspiration precaution Anticipate discharge to nursing facility as patient has failure to thrive, worsening generalized weakness, in setting of bladder cancer status post metastasis   Adrenal  insufficiency due to cancer therapy (HCC) Home hydrocortisone dosing resumed on admission   Hyperlipidemia Home atorvastatin resumed on admission   Pseudo-Hyponatremia d/t hyperglycemia Monitor BMP   Essential hypertension Low BP here Hydralazine 5 mg q6h prn for sbp > 170, 5 days ordered   Chronic deep vein thrombosis (DVT) of lower extremity (HCC) Home apixaban 5 mg p.o. twice daily resumed   History of bladder cancer With current chronic Foley indwelling catheter      Class 2 obesity based on BMI: Body mass index is 36.01 kg/m.  Underweight - under 18  overweight - 25 to 29 obese - 30 or more Class 1 obesity: BMI of 30.0 to 34 Class 2 obesity: BMI of 35.0 to 39 Class 3 obesity: BMI of 40.0 to 49 Super Morbid Obesity: BMI 50-59 Super-super Morbid Obesity: BMI 60+ Significantly low or high BMI is associated with higher medical risk.  Weight management advised as adjunct to other disease management and risk reduction treatments    DVT prophylaxis: eliquis  IV fluids: have dc continuous IV fluids  Nutrition: carb diet Central lines / invasive devices: chemo port   Code Status: FULL CODE ACP documentation reviewed:  none on file in [facility]  TOC needs: SNF palcement Barriers to dispo / significant pending items: placement, stabilization of glucose / insulin dosage              Subjective / Brief ROS:  Patient reports feeling better today but quite tired, overall weak Worried he was dc too soon from hospital earlier this week Reports chest tightness worse w/ coughing Pain controlled.  Denies focal weakness.  Tolerating diet.  Reports no concerns w/ urination/defecation.  Family Communication: none at this time     Objective Findings:  Vitals:   10/18/23 0800 10/18/23 1000 10/18/23 1202 10/18/23 1301  BP: 106/69  133/63 (!) 118/55  Pulse: 80  85 87  Resp: 15 18 18 18   Temp: 98.3 F (36.8 C)   98.5 F (36.9 C)  TempSrc: Oral     SpO2: 95%   96% 93%  Weight:    113 kg  Height:    5\' 10"  (1.778 m)    Intake/Output Summary (Last 24 hours) at 10/18/2023 1400 Last data filed at 10/18/2023 1200 Gross per 24 hour  Intake 3853.94 ml  Output 3125 ml  Net 728.94 ml   Filed Weights   10/17/23 1102 10/18/23 1301  Weight: 113.9 kg 113 kg    Examination:  Physical Exam Constitutional:      General: He is not in acute distress.    Appearance: He is not ill-appearing.  Cardiovascular:     Rate and Rhythm: Normal rate and regular rhythm.  Pulmonary:     Effort: Pulmonary effort is normal.     Breath sounds: Normal breath sounds.  Abdominal:     General: Abdomen is flat.     Palpations: Abdomen is soft.  Musculoskeletal:     Right lower leg: No edema.     Left lower leg: No edema.  Skin:    General: Skin is warm and dry.  Neurological:     General: No focal deficit present.     Mental Status: He is alert and oriented to person, place, and time. Mental status is at baseline.  Psychiatric:        Mood and Affect: Mood normal.        Behavior: Behavior normal.          Scheduled Medications:   apixaban  5 mg Oral BID   atorvastatin  10 mg Oral q1800   Chlorhexidine Gluconate Cloth  6 each Topical Q0600   gabapentin  600 mg Oral BID   hydrocortisone  20 mg Oral Daily   And   hydrocortisone  10 mg Oral QHS   insulin aspart  0-20 Units Subcutaneous TID WC   insulin aspart  0-5 Units Subcutaneous QHS   insulin glargine-yfgn  15 Units Subcutaneous Q2200   levothyroxine  112 mcg Oral Q0600    Continuous Infusions:   PRN Medications:  acetaminophen **OR** acetaminophen, dextrose, hydrALAZINE, nitroGLYCERIN, ondansetron **OR** ondansetron (ZOFRAN) IV, senna-docusate  Antimicrobials from admission:  Anti-infectives (From admission, onward)    Start     Dose/Rate Route Frequency Ordered Stop   10/18/23 1400  vancomycin (VANCOREADY) IVPB 1250 mg/250 mL  Status:  Discontinued        1,250 mg 166.7 mL/hr over 90  Minutes Intravenous Every 24 hours 10/17/23 1535 10/18/23 1102   10/18/23 1400  vancomycin (VANCOREADY) IVPB 750 mg/150 mL  Status:  Discontinued        750 mg 150 mL/hr over 60 Minutes Intravenous Every 24 hours 10/18/23 1102 10/18/23 1332   10/17/23 2300  metroNIDAZOLE (FLAGYL) IVPB 500 mg  Status:  Discontinued        500 mg 100 mL/hr over 60 Minutes Intravenous Every 12 hours 10/17/23 1309 10/18/23 1332   10/17/23 2200  ceFEPIme (MAXIPIME) 2 g in sodium chloride 0.9 % 100 mL IVPB  Status:  Discontinued        2 g 200 mL/hr over 30 Minutes Intravenous Every 12 hours 10/17/23 1535 10/18/23 1332  10/17/23 1345  vancomycin (VANCOCIN) IVPB 1000 mg/200 mL premix        1,000 mg 200 mL/hr over 60 Minutes Intravenous  Once 10/17/23 1337 10/17/23 1542   10/17/23 1115  aztreonam (AZACTAM) 2 g in sodium chloride 0.9 % 100 mL IVPB        2 g 200 mL/hr over 30 Minutes Intravenous  Once 10/17/23 1113 10/17/23 1158   10/17/23 1115  metroNIDAZOLE (FLAGYL) IVPB 500 mg        500 mg 100 mL/hr over 60 Minutes Intravenous  Once 10/17/23 1113 10/17/23 1305   10/17/23 1115  vancomycin (VANCOCIN) IVPB 1000 mg/200 mL premix        1,000 mg 200 mL/hr over 60 Minutes Intravenous  Once 10/17/23 1113 10/17/23 1403           Data Reviewed:  I have personally reviewed the following...  CBC: Recent Labs  Lab 10/12/23 0440 10/13/23 0559 10/17/23 1102 10/18/23 0306  WBC 10.0 8.4 12.8* 9.1  NEUTROABS  --   --  6.7  --   HGB 11.0* 11.4* 15.2 11.4*  HCT 31.9* 33.3* 42.6 32.7*  MCV 99.4 99.4 96.2 97.6  PLT 184 191 265 195   Basic Metabolic Panel: Recent Labs  Lab 10/12/23 0440 10/13/23 0559 10/17/23 1102 10/17/23 1342 10/17/23 1729 10/17/23 1845 10/18/23 0942  NA 131* 134* 131* 132* 132* 133* 133*  K 4.3 3.8 3.1* 3.5 3.5 3.2* 3.8  CL 100 103 95* 94* 96* 96* 101  CO2 19* 23 19* 26 26 25 23   GLUCOSE 368* 335* 257* 248* 206* 188* 190*  BUN 30* 32* 18 17 16 16 12   CREATININE 1.64* 1.52*  1.55* 1.38* 1.30* 1.34* 1.31*  CALCIUM 8.4* 8.5* 8.8* 7.9* 8.2* 8.4* 8.4*  MG 2.1 2.2  --  1.8  --   --   --    GFR: Estimated Creatinine Clearance: 56.6 mL/min (A) (by C-G formula based on SCr of 1.31 mg/dL (H)). Liver Function Tests: Recent Labs  Lab 10/12/23 0440 10/13/23 0559 10/17/23 1102  AST 17 13* 16  ALT 7 9 13   ALKPHOS 57 54 61  BILITOT 1.1 0.8 1.4*  PROT 6.6 6.7 7.7  ALBUMIN 3.1* 3.1* 3.5   No results for input(s): "LIPASE", "AMYLASE" in the last 168 hours. No results for input(s): "AMMONIA" in the last 168 hours. Coagulation Profile: Recent Labs  Lab 10/17/23 1144  INR 1.2   Cardiac Enzymes: No results for input(s): "CKTOTAL", "CKMB", "CKMBINDEX", "TROPONINI" in the last 168 hours. BNP (last 3 results) No results for input(s): "PROBNP" in the last 8760 hours. HbA1C: No results for input(s): "HGBA1C" in the last 72 hours. CBG: Recent Labs  Lab 10/17/23 1743 10/17/23 1939 10/17/23 2218 10/18/23 0733 10/18/23 1105  GLUCAP 213* 154* 130* 209* 326*   Lipid Profile: No results for input(s): "CHOL", "HDL", "LDLCALC", "TRIG", "CHOLHDL", "LDLDIRECT" in the last 72 hours. Thyroid Function Tests: No results for input(s): "TSH", "T4TOTAL", "FREET4", "T3FREE", "THYROIDAB" in the last 72 hours. Anemia Panel: Recent Labs    10/17/23 1630  VITAMINB12 335   Most Recent Urinalysis On File:     Component Value Date/Time   COLORURINE YELLOW (A) 10/17/2023 1102   APPEARANCEUR CLOUDY (A) 10/17/2023 1102   LABSPEC 1.016 10/17/2023 1102   PHURINE 6.0 10/17/2023 1102   GLUCOSEU 150 (A) 10/17/2023 1102   HGBUR SMALL (A) 10/17/2023 1102   BILIRUBINUR NEGATIVE 10/17/2023 1102   BILIRUBINUR negative 02/23/2018 1048   BILIRUBINUR neg 03/01/2015  1132   KETONESUR 20 (A) 10/17/2023 1102   PROTEINUR 30 (A) 10/17/2023 1102   UROBILINOGEN 0.2 02/23/2018 1048   NITRITE NEGATIVE 10/17/2023 1102   LEUKOCYTESUR TRACE (A) 10/17/2023 1102   Sepsis  Labs: @LABRCNTIP (procalcitonin:4,lacticidven:4) Microbiology: Recent Results (from the past 240 hours)  Blood Culture (routine x 2)     Status: None   Collection Time: 10/10/23  9:22 PM   Specimen: BLOOD LEFT HAND  Result Value Ref Range Status   Specimen Description BLOOD LEFT HAND  Final   Special Requests   Final    BOTTLES DRAWN AEROBIC AND ANAEROBIC Blood Culture adequate volume   Culture   Final    NO GROWTH 5 DAYS Performed at [facility], [address] [place], [place] [address]    Report Status 10/15/2023 FINAL  Final  Blood Culture (routine x 2)     Status: None   Collection Time: 10/10/23  9:22 PM   Specimen: Left Antecubital; Blood  Result Value Ref Range Status   Specimen Description LEFT ANTECUBITAL  Final   Special Requests   Final    BOTTLES DRAWN AEROBIC AND ANAEROBIC Blood Culture adequate volume   Culture   Final    NO GROWTH 5 DAYS Performed at [facility], [address] [place], [place] [address]    Report Status 10/15/2023 FINAL  Final  Resp panel by RT-PCR (RSV, Flu A&B, Covid) Anterior Nasal Swab     Status: Abnormal   Collection Time: 10/11/23 12:38 AM   Specimen: Anterior Nasal Swab  Result Value Ref Range Status   SARS Coronavirus 2 by RT PCR NEGATIVE NEGATIVE Final    Comment: (NOTE) SARS-CoV-2 target nucleic acids are NOT DETECTED.  The SARS-CoV-2 RNA is generally detectable in upper respiratory specimens during the acute phase of infection. The lowest concentration of SARS-CoV-2 viral copies this assay can detect is 138 copies/mL. A negative result does not preclude SARS-Cov-2 infection and should not be used as the sole basis for treatment or other patient management decisions. A negative result may occur with  improper specimen collection/handling, submission of specimen other than nasopharyngeal swab, presence of viral mutation(s) within the areas targeted by this assay, and inadequate number of viral copies(<138  copies/mL). A negative result must be combined with clinical observations, patient history, and epidemiological information. The expected result is Negative.  Fact Sheet for Patients:  BloggerCourse.com  Fact Sheet for Healthcare Providers:  SeriousBroker.it  This test is no t yet approved or cleared by the [place] FDA and  has been authorized for detection and/or diagnosis of SARS-CoV-2 by FDA under an Emergency Use Authorization (EUA). This EUA will remain  in effect (meaning this test can be used) for the duration of the COVID-19 declaration under Section 564(b)(1) of the Act, 21 U.S.C.section 360bbb-3(b)(1), unless the authorization is terminated  or revoked sooner.       Influenza A by PCR NEGATIVE NEGATIVE Final   Influenza B by PCR NEGATIVE NEGATIVE Final    Comment: (NOTE) The Xpert Xpress SARS-CoV-2/FLU/RSV plus assay is intended as an aid in the diagnosis of influenza from Nasopharyngeal swab specimens and should not be used as a sole basis for treatment. Nasal washings and aspirates are unacceptable for Xpert Xpress SARS-CoV-2/FLU/RSV testing.  Fact Sheet for Patients: BloggerCourse.com  Fact Sheet for Healthcare Providers: SeriousBroker.it  This test is not yet approved or cleared by the [place] FDA and has been authorized for detection and/or diagnosis of SARS-CoV-2 by FDA  under an Emergency Use Authorization (EUA). This EUA will remain in effect (meaning this test can be used) for the duration of the COVID-19 declaration under Section 564(b)(1) of the Act, 21 U.S.C. section 360bbb-3(b)(1), unless the authorization is terminated or revoked.     Resp Syncytial Virus by PCR POSITIVE (A) NEGATIVE Final    Comment: (NOTE) Fact Sheet for Patients: BloggerCourse.com  Fact Sheet for Healthcare  Providers: SeriousBroker.it  This test is not yet approved or cleared by the [place] FDA and has been authorized for detection and/or diagnosis of SARS-CoV-2 by FDA under an Emergency Use Authorization (EUA). This EUA will remain in effect (meaning this test can be used) for the duration of the COVID-19 declaration under Section 564(b)(1) of the Act, 21 U.S.C. section 360bbb-3(b)(1), unless the authorization is terminated or revoked.  Performed at [facility], [address] [place], [place] [address]   MRSA Next Gen by PCR, Nasal     Status: None   Collection Time: 10/11/23  1:53 AM   Specimen: Nasal Mucosa; Nasal Swab  Result Value Ref Range Status   MRSA by PCR Next Gen NOT DETECTED NOT DETECTED Final    Comment: (NOTE) The GeneXpert MRSA Assay (FDA approved for NASAL specimens only), is one component of a comprehensive MRSA colonization surveillance program. It is not intended to diagnose MRSA infection nor to guide or monitor treatment for MRSA infections. Test performance is not FDA approved in patients less than 92 years old. Performed at [facility], [address] [place], [place] [address]   MRSA Next Gen by PCR, Nasal     Status: None   Collection Time: 10/17/23 10:36 AM   Specimen: Nasal Mucosa; Nasal Swab  Result Value Ref Range Status   MRSA by PCR Next Gen NOT DETECTED NOT DETECTED Final    Comment: (NOTE) The GeneXpert MRSA Assay (FDA approved for NASAL specimens only), is one component of a comprehensive MRSA colonization surveillance program. It is not intended to diagnose MRSA infection nor to guide or monitor treatment for MRSA infections. Test performance is not FDA approved in patients less than 44 years old. Performed at [facility], [address] [place], [place] [address]   Urine Culture     Status: None   Collection Time: 10/17/23 11:02 AM   Specimen: Urine, Random  Result Value  Ref Range Status   Specimen Description   Final    URINE, RANDOM Performed at [facility], [address], [place], [place] [address]    Special Requests   Final    NONE Reflexed from [id number] Performed at [facility], [address], [place], [place] [address]    Culture   Final    NO GROWTH Performed at [facility], [address], [place], [place] [address]    Report Status 10/18/2023 FINAL  Final  Resp panel by RT-PCR (RSV, Flu A&B, Covid) Anterior Nasal Swab     Status: Abnormal   Collection Time: 10/17/23 11:12 AM   Specimen: Anterior Nasal Swab  Result Value Ref Range Status   SARS Coronavirus 2 by RT PCR NEGATIVE NEGATIVE Final    Comment: (NOTE) SARS-CoV-2 target nucleic acids are NOT DETECTED.  The SARS-CoV-2 RNA is generally detectable in upper respiratory specimens during the acute phase of infection. The lowest concentration of SARS-CoV-2 viral copies this assay can detect is 138 copies/mL. A negative result does not preclude SARS-Cov-2 infection and should not be used as  the sole basis for treatment or other patient management decisions. A negative result may occur with  improper specimen collection/handling, submission of specimen other than nasopharyngeal swab, presence of viral mutation(s) within the areas targeted by this assay, and inadequate number of viral copies(<138 copies/mL). A negative result must be combined with clinical observations, patient history, and epidemiological information. The expected result is Negative.  Fact Sheet for Patients:  BloggerCourse.com  Fact Sheet for Healthcare Providers:  SeriousBroker.it  This test is no t yet approved or cleared by the [place] FDA and  has been authorized for detection and/or diagnosis of SARS-CoV-2 by FDA under an Emergency Use Authorization (EUA). This EUA will remain  in effect (meaning this test can be used) for the  duration of the COVID-19 declaration under Section 564(b)(1) of the Act, 21 U.S.C.section 360bbb-3(b)(1), unless the authorization is terminated  or revoked sooner.       Influenza A by PCR NEGATIVE NEGATIVE Final   Influenza B by PCR NEGATIVE NEGATIVE Final    Comment: (NOTE) The Xpert Xpress SARS-CoV-2/FLU/RSV plus assay is intended as an aid in the diagnosis of influenza from Nasopharyngeal swab specimens and should not be used as a sole basis for treatment. Nasal washings and aspirates are unacceptable for Xpert Xpress SARS-CoV-2/FLU/RSV testing.  Fact Sheet for Patients: BloggerCourse.com  Fact Sheet for Healthcare Providers: SeriousBroker.it  This test is not yet approved or cleared by the [place] FDA and has been authorized for detection and/or diagnosis of SARS-CoV-2 by FDA under an Emergency Use Authorization (EUA). This EUA will remain in effect (meaning this test can be used) for the duration of the COVID-19 declaration under Section 564(b)(1) of the Act, 21 U.S.C. section 360bbb-3(b)(1), unless the authorization is terminated or revoked.     Resp Syncytial Virus by PCR POSITIVE (A) NEGATIVE Final    Comment: (NOTE) Fact Sheet for Patients: BloggerCourse.com  Fact Sheet for Healthcare Providers: SeriousBroker.it  This test is not yet approved or cleared by the [place] FDA and has been authorized for detection and/or diagnosis of SARS-CoV-2 by FDA under an Emergency Use Authorization (EUA). This EUA will remain in effect (meaning this test can be used) for the duration of the COVID-19 declaration under Section 564(b)(1) of the Act, 21 U.S.C. section 360bbb-3(b)(1), unless the authorization is terminated or revoked.  Performed at [facility], [address] [place], [place] [address]   Blood Culture (routine x 2)     Status: None  (Preliminary result)   Collection Time: 10/17/23 11:43 AM   Specimen: BLOOD  Result Value Ref Range Status   Specimen Description BLOOD RIGHT ANTECUBITAL  Final   Special Requests   Final    BOTTLES DRAWN AEROBIC AND ANAEROBIC Blood Culture results may not be optimal due to an inadequate volume of blood received in culture bottles   Culture   Final    NO GROWTH < 24 HOURS Performed at [facility], [address], [place], [place] [address]    Report Status PENDING  Incomplete  Blood Culture (routine x 2)     Status: None (Preliminary result)   Collection Time: 10/17/23 11:43 AM   Specimen: BLOOD  Result Value Ref Range Status   Specimen Description BLOOD BLOOD RIGHT FOREARM  Final   Special Requests   Final    BOTTLES DRAWN AEROBIC AND ANAEROBIC Blood Culture results may not be optimal due to an inadequate volume of blood received in culture bottles   Culture  Final    NO GROWTH < 24 HOURS Performed at [facility], [address] [place], [place] [address]    Report Status PENDING  Incomplete      Radiology Studies last 3 days: DG Chest Portable 1 View Result Date: 10/17/2023 CLINICAL DATA:  sepsis EXAM: PORTABLE CHEST 1 VIEW COMPARISON:  Chest x-ray 10/10/2023 FINDINGS: Right chest wall Port-A-Cath with tip overlying the right atrium. The heart and mediastinal contours are unchanged. Atherosclerotic plaque. No focal consolidation. No pulmonary edema. No pleural effusion. No pneumothorax. No acute osseous abnormality. IMPRESSION: No active disease. Electronically Signed   By: Tish Frederickson M.D.   On: 10/17/2023 12:47          Sunnie Nielsen, DO [facility] 10/18/2023, 2:00 PM    Dictation software may have been used to generate the above note. Typos may occur and escape review in typed/dictated notes. Please contact Dr Lyn Hollingshead directly for clarity if needed.  Staff may message me via secure chat in Epic  but this may not receive an  immediate response,  please page me for urgent matters!  If 7PM-7AM, please contact night coverage [email or web address]

## 2023-10-18 NOTE — Progress Notes (Signed)
  Echocardiogram 2D Echocardiogram has been performed. Definity IV ultrasound imaging agent used on this study.  Christian [name] 10/18/2023, 3:58 PM

## 2023-10-18 NOTE — Inpatient Diabetes Management (Signed)
 Inpatient Diabetes Program Recommendations  AACE/ADA: New Consensus Statement on Inpatient Glycemic Control (2015)  Target Ranges:  Prepandial:   less than 140 mg/dL      Peak postprandial:   less than 180 mg/dL (1-2 hours)      Critically ill patients:  140 - 180 mg/dL   Lab Results  Component Value Date   GLUCAP 209 (H) 10/18/2023   HGBA1C 8.6 (H) 10/10/2023    Review of Glycemic Control  Latest Reference Range & Units 10/17/23 19:39 10/17/23 22:18 10/18/23 07:33  Glucose-Capillary 70 - 99 mg/dL 045 (H) 409 (H) 811 (H)   Diabetes history: DM  Outpatient Diabetes medications:  Dexcom G7, Lantus 48 units q HS, Novolog 25-40 units tid with meals Current orders for Inpatient glycemic control:  Novolog 0-9 units tid with meals and HS  Inpatient Diabetes Program Recommendations:    Note patient transitioned off IV insulin yesterday.  He did receive 10 units of basal insulin.  Please add Semglee 15 units daily. He has history of diabetes requiring insulin.  Unsure what precipitated event. Patient was inpatient 5 days ago.    *DM coordinator working remote.  Please page for any immediate needs from 8a-5p.  Thanks,  Lorenza Cambridge, RN, BC-ADM Inpatient Diabetes Coordinator Pager [phone]  (8a-5p)

## 2023-10-18 NOTE — Plan of Care (Signed)
   Problem: Education: Goal: Knowledge of General Education information will improve Description: Including pain rating scale, medication(s)/side effects and non-pharmacologic comfort measures Outcome: Progressing   Problem: Elimination: Goal: Will not experience complications related to bowel motility Outcome: Progressing   Problem: Safety: Goal: Ability to remain free from injury will improve Outcome: Progressing   Problem: Skin Integrity: Goal: Risk for impaired skin integrity will decrease Outcome: Progressing

## 2023-10-18 NOTE — Consult Note (Signed)
 Pharmacy Antibiotic Note  Christian [name] is a 83 y.o. male admitted on 10/17/2023 with  shortness of breath and cough .  Pharmacy has been consulted for Vancomycin and Cefepime dosing.  Plan: Vancomycin 2g IV x 1 as loading dose, followed by 1250 mg IV Q 24 hrs. Goal AUC 400-550. Will adjust to vancomycin 750 mg q24H. Predicted AUC of 427. Vd 0.5, Scr 1.31. IBW. Plan to obtain vancomycin level after 4th or 5th dose.   Continue cefepime 2g IV Q12 hours   Height: 5\' 10"  (177.8 cm) Weight: 113.9 kg (251 lb) IBW/kg (Calculated) : 73  Temp (24hrs), Avg:98.1 F (36.7 C), Min:97.4 F (36.3 C), Max:98.7 F (37.1 C)  Recent Labs  Lab 10/11/23 2219 10/12/23 0440 10/13/23 0559 10/17/23 1102 10/17/23 1342 10/17/23 1729 10/17/23 1845 10/18/23 0306 10/18/23 0942  WBC  --  10.0 8.4 12.8*  --   --   --  9.1  --   CREATININE 1.66* 1.64* 1.52* 1.55* 1.38* 1.30* 1.34*  --  1.31*  LATICACIDVEN  --   --   --  2.7* 1.8  --   --   --   --   VANCORANDOM 10  --   --   --   --   --   --   --   --     Estimated Creatinine Clearance: 56.9 mL/min (A) (by C-G formula based on SCr of 1.31 mg/dL (H)).    Allergies  Allergen Reactions   Ceftriaxone Hives    Tolerated cefazolin 09/2021 without reaction Received piperacillin/tazobactam in 2019 (after reported ceftriaxone allergy which was in 2018)   Other Hives   Sulfasalazine Hives   Sulfa Antibiotics Hives    Antimicrobials this admission: Vancomycin 2/21 >>  Cefepime 2/21 >>  Flagyl 2/21 >> Aztreonam 2/21 x 1   Dose adjustments this admission: N/A  Microbiology results: 2/21 BCx: collected 2/21 UCx: collected  2/21 MRSA PCR: negative.   Thank you for allowing pharmacy to be a part of this patient's care.  Ronnald Ramp, PharmD, BCPS 10/18/2023 11:02 AM

## 2023-10-19 DIAGNOSIS — E8729 Other acidosis: Secondary | ICD-10-CM | POA: Diagnosis not present

## 2023-10-19 LAB — ECHOCARDIOGRAM COMPLETE
AR max vel: 2.33 cm2
AV Peak grad: 9.5 mm[Hg]
Ao pk vel: 1.54 m/s
Area-P 1/2: 2.57 cm2
Calc EF: 68.4 %
Height: 70 in
S' Lateral: 3.1 cm
Single Plane A2C EF: 68.4 %
Single Plane A4C EF: 68 %
Weight: 3985.92 [oz_av]

## 2023-10-19 LAB — GLUCOSE, CAPILLARY
Glucose-Capillary: 101 mg/dL — ABNORMAL HIGH (ref 70–99)
Glucose-Capillary: 284 mg/dL — ABNORMAL HIGH (ref 70–99)
Glucose-Capillary: 302 mg/dL — ABNORMAL HIGH (ref 70–99)
Glucose-Capillary: 428 mg/dL — ABNORMAL HIGH (ref 70–99)
Glucose-Capillary: 52 mg/dL — ABNORMAL LOW (ref 70–99)
Glucose-Capillary: 76 mg/dL (ref 70–99)

## 2023-10-19 LAB — CBC
HCT: 32.4 % — ABNORMAL LOW (ref 39.0–52.0)
Hemoglobin: 11.3 g/dL — ABNORMAL LOW (ref 13.0–17.0)
MCH: 34 pg (ref 26.0–34.0)
MCHC: 34.9 g/dL (ref 30.0–36.0)
MCV: 97.6 fL (ref 80.0–100.0)
Platelets: 211 10*3/uL (ref 150–400)
RBC: 3.32 MIL/uL — ABNORMAL LOW (ref 4.22–5.81)
RDW: 12.8 % (ref 11.5–15.5)
WBC: 6.4 10*3/uL (ref 4.0–10.5)
nRBC: 0 % (ref 0.0–0.2)

## 2023-10-19 LAB — BASIC METABOLIC PANEL
Anion gap: 8 (ref 5–15)
BUN: 18 mg/dL (ref 8–23)
CO2: 25 mmol/L (ref 22–32)
Calcium: 8.4 mg/dL — ABNORMAL LOW (ref 8.9–10.3)
Chloride: 100 mmol/L (ref 98–111)
Creatinine, Ser: 1.38 mg/dL — ABNORMAL HIGH (ref 0.61–1.24)
GFR, Estimated: 52 mL/min — ABNORMAL LOW (ref >60)
Glucose, Bld: 203 mg/dL — ABNORMAL HIGH (ref 70–99)
Potassium: 3.6 mmol/L (ref 3.5–5.1)
Sodium: 133 mmol/L — ABNORMAL LOW (ref 135–145)

## 2023-10-19 MED ORDER — INSULIN ASPART 100 UNIT/ML IJ SOLN
10.0000 [IU] | Freq: Three times a day (TID) | INTRAMUSCULAR | Status: DC
  Administered 2023-10-19: 10 [IU] via SUBCUTANEOUS
  Filled 2023-10-19: qty 1

## 2023-10-19 MED ORDER — INSULIN GLARGINE-YFGN 100 UNIT/ML ~~LOC~~ SOLN
20.0000 [IU] | Freq: Every day | SUBCUTANEOUS | Status: DC
  Administered 2023-10-19: 20 [IU] via SUBCUTANEOUS
  Filled 2023-10-19 (×2): qty 0.2

## 2023-10-19 MED ORDER — INSULIN ASPART 100 UNIT/ML IJ SOLN
6.0000 [IU] | Freq: Three times a day (TID) | INTRAMUSCULAR | Status: DC
Start: 2023-10-19 — End: 2023-10-19
  Administered 2023-10-19: 6 [IU] via SUBCUTANEOUS

## 2023-10-19 NOTE — Progress Notes (Signed)
 Physical Therapy Treatment Patient Details Name: Christian [name] MRN: [id number] DOB: Oct 11, 1940 Today's Date: 10/19/2023   History of Present Illness Pt is an 83 y.o. male presenting to hospital 10/17/23 with c/o SOB, cough, and weakness; recent hospital discharge 10/13/23 with metabolic encephalopathy in setting of RSV and UTI.  Pt admitted with high anion gap metabolic acidosis, a-fib, hypokalemia, chest pressure, hypothyroidism, and weakness.  PMH includes IDDM type 1, paroxysmal a-fib, CAD, htn, h/o bladder CA s/p metastasis to intra abdominal lymph nodes, adrenal insufficiency, CKD 3a.    PT Comments  Pt resting in bed upon PT arrival; pt agreeable to therapy session; pt reporting his wife was also sick and EMS had recently picked her up to take her to the hospital.  During session pt SBA with bed mobility; SBA with transfer from bed; and CGA to ambulate approximately 200 feet with RW use.  Mild to moderate SOB noted with ambulation (SpO2 sats 91% or greater with activity and 96% at rest beginning/end of session on room air; HR around 87 bpm at rest but increased up to 105 bpm with activity).  Will continue to focus on strengthening, balance, and progressive functional mobility during hospitalization.   If plan is discharge home, recommend the following: A little help with walking and/or transfers;A little help with bathing/dressing/bathroom;Assistance with cooking/housework;Assist for transportation;Help with stairs or ramp for entrance   Can travel by private vehicle      Yes  Equipment Recommendations  Rolling walker (2 wheels)    Recommendations for Other Services       Precautions / Restrictions Precautions Precautions: Fall Recall of Precautions/Restrictions: Intact Precaution/Restrictions Comments: RLQ urostomy Restrictions Weight Bearing Restrictions Per Provider Order: No     Mobility  Bed Mobility Overal bed mobility: Needs Assistance Bed Mobility: Supine to Sit, Sit to  Supine     Supine to sit: Supervision, HOB elevated Sit to supine: Supervision, HOB elevated   General bed mobility comments: mild increased effort to perform on own    Transfers Overall transfer level: Needs assistance Equipment used: Rolling walker (2 wheels) Transfers: Sit to/from Stand Sit to Stand: Supervision           General transfer comment: vc's for UE placement; mild increased effort to stand from bed up to RW    Ambulation/Gait Ambulation/Gait assistance: Contact guard assist Gait Distance (Feet): 200 Feet Assistive device: Rolling walker (2 wheels) Gait Pattern/deviations: Step-through pattern, Decreased step length - right, Decreased step length - left Gait velocity: decreased     General Gait Details: steady ambulation with RW use   Stairs             Wheelchair Mobility     Tilt Bed    Modified Rankin (Stroke Patients Only)       Balance Overall balance assessment: Needs assistance Sitting-balance support: No upper extremity supported, Feet supported Sitting balance-Leahy Scale: Normal Sitting balance - Comments: steady reaching outside BOS   Standing balance support: Bilateral upper extremity supported, During functional activity, Reliant on assistive device for balance Standing balance-Leahy Scale: Good Standing balance comment: steady ambulating with RW use                            Communication Communication Communication: No apparent difficulties  Cognition Arousal: Alert Behavior During Therapy: WFL for tasks assessed/performed   PT - Cognitive impairments: No apparent impairments  PT - Cognition Comments: A&Ox4 Following commands: Intact      Cueing Cueing Techniques: Verbal cues  Exercises      General Comments General comments (skin integrity, edema, etc.): RLQ urostomy in place.  Nursing cleared pt for participation in physical therapy.  Pt agreeable to PT session.   Supportive listening provided during session (pt's wife sick and on way to hospital via EMS).      Pertinent Vitals/Pain Pain Assessment Pain Assessment: No/denies pain    Home Living                          Prior Function            PT Goals (current goals can now be found in the care plan section) Acute Rehab PT Goals Patient Stated Goal: to improve strength and go home PT Goal Formulation: With patient Time For Goal Achievement: 11/01/23 Potential to Achieve Goals: Good Progress towards PT goals: Progressing toward goals    Frequency    Min 1X/week      PT Plan      Co-evaluation              AM-PAC PT "6 Clicks" Mobility   Outcome Measure  Help needed turning from your back to your side while in a flat bed without using bedrails?: None Help needed moving from lying on your back to sitting on the side of a flat bed without using bedrails?: None Help needed moving to and from a bed to a chair (including a wheelchair)?: A Little Help needed standing up from a chair using your arms (e.g., wheelchair or bedside chair)?: A Little Help needed to walk in hospital room?: A Little Help needed climbing 3-5 steps with a railing? : A Little 6 Click Score: 20    End of Session Equipment Utilized During Treatment: Gait belt (up high away from urostomy) Activity Tolerance: Patient tolerated treatment well Patient left: in bed;with call bell/phone within reach;with bed alarm set;with nursing/sitter in room Nurse Communication: Mobility status;Precautions;Other (comment) (Pt's vitals during session) PT Visit Diagnosis: Other abnormalities of gait and mobility (R26.89);Muscle weakness (generalized) (M62.81)     Time: 1152-1209 PT Time Calculation (min) (ACUTE ONLY): 17 min  Charges:    $Therapeutic Activity: 8-22 mins PT General Charges $$ ACUTE PT VISIT: 1 Visit                     Hendricks Limes, PT 10/19/23, 12:50 PM

## 2023-10-19 NOTE — Progress Notes (Signed)
 PROGRESS NOTE    Christian [name]   VOZ:[id number] DOB: 1941/03/04  DOA: 10/17/2023 Date of Service: 10/19/23 which is hospital day 2  PCP: Ethelda Chick, MD    Hospital course / significant events:   HPI: Christian [name] is an 83 year old male with history of insulin-dependent diabetes mellitus type 1, paroxysmal atrial fibrillation, coronary artery disease, hypertension, history of bladder cancer status post metastasis to intra abdominal lymph nodes, adrenal insufficiency, due to cancer therapy, CKD 3a, who presents to the emergency department for chief concerns of progressive weakness, hypotension, hypoxia to 89% on room air. Recently discharged from hospital 02/17 and was having nausea, worsening weakness over past few days.   02/21: in ED BP 80/62, (+)RSV, hyperglycemia at Glc 257, AG 17, Beta Hydroxybutyrate 1.41, Lactic 2.7. BS abx w/ flagyl, aztreonam, vanc, LR fluids.  02/22: d/c abx. Suspect dehydration is his primary problem. Reports persistent chest discomfort unchanged over past week, Echo ordered. Increased insulin  02/23: Echo read, no concerns. Glc high, titrating insulin.      Consultants:  none  Procedures/Surgeries: none      ASSESSMENT & PLAN:   Anion gap metabolic acidosis Borderline DKA Mild anion gap elevation with beta hydroxybutyrate Insulin per Endo tool ordered on admission --> now on SSI achs and basal, increased dose given hyperglycemia    A-fib Home apixaban 5 mg p.o. twice daily resumed   Hypokalemia Replace as needed Monitor BMP  Chest pressure w/ coughing Troponins flat  Question viral effect Nitroglycerin sublingual every 5 minutes as needed for chest pain resumed on admission Non-emergent echo ordered --> no pericardial abn or other concerning findings    Hypothyroidism Home levothyroxine 112 mcg daily resumed   Weakness PT, OT ordered on admission to assess in 10/18/2023 Fall precaution, aspiration precaution Anticipate  discharge to nursing facility as patient has failure to thrive, worsening generalized weakness, in setting of bladder cancer status post metastasis   Adrenal insufficiency due to cancer therapy  Home hydrocortisone dosing resumed on admission   CKD3a Monitor BMP  Hyperlipidemia Home atorvastatin resumed on admission   Pseudo-Hyponatremia d/t hyperglycemia Monitor BMP   Essential hypertension Low BP here Hydralazine 5 mg q6h prn for sbp > 170, 5 days ordered   Chronic deep vein thrombosis (DVT) of lower extremity  Home apixaban 5 mg p.o. twice daily resumed   History of bladder cancer Follow outpatient      Class 2 obesity based on BMI: Body mass index is 35.74 kg/m.  Underweight - under 18  overweight - 25 to 29 obese - 30 or more Class 1 obesity: BMI of 30.0 to 34 Class 2 obesity: BMI of 35.0 to 39 Class 3 obesity: BMI of 40.0 to 49 Super Morbid Obesity: BMI 50-59 Super-super Morbid Obesity: BMI 60+ Significantly low or high BMI is associated with higher medical risk.  Weight management advised as adjunct to other disease management and risk reduction treatments    DVT prophylaxis: eliquis  IV fluids: have dc continuous IV fluids  Nutrition: carb diet Central lines / invasive devices: chemo port, Foley   Code Status: FULL CODE ACP documentation reviewed:  none on file in [facility]  TOC needs: SNF palcement Barriers to dispo / significant pending items: placement, stabilization of glucose / insulin dosage, anticipate medically ready tomorrow              Subjective / Brief ROS:  Patient reports feeling better today NO chest pain  Pain controlled.  Denies  focal weakness.  Tolerating diet.   Family Communication: none at this time     Objective Findings:  Vitals:   10/18/23 1301 10/18/23 1543 10/18/23 2122 10/19/23 0738  BP: (!) 118/55 (!) 112/51 (!) 120/55 (!) 135/91  Pulse: 87 84 75 77  Resp: 18 18 20 16   Temp: 98.5 F (36.9 C) 98.6 F  (37 C) 98.5 F (36.9 C) 97.6 F (36.4 C)  TempSrc:   Oral Oral  SpO2: 93% 94% 94% 95%  Weight: 113 kg     Height: 5\' 10"  (1.778 m)       Intake/Output Summary (Last 24 hours) at 10/19/2023 1519 Last data filed at 10/19/2023 1418 Gross per 24 hour  Intake 645.94 ml  Output 3825 ml  Net -3179.06 ml   Filed Weights   10/17/23 1102 10/18/23 1301  Weight: 113.9 kg 113 kg    Examination:  Physical Exam Constitutional:      General: He is not in acute distress.    Appearance: He is not ill-appearing.  Cardiovascular:     Rate and Rhythm: Normal rate and regular rhythm.  Pulmonary:     Effort: Pulmonary effort is normal.     Breath sounds: Normal breath sounds.  Abdominal:     General: Abdomen is flat.     Palpations: Abdomen is soft.  Musculoskeletal:     Right lower leg: No edema.     Left lower leg: No edema.  Skin:    General: Skin is warm and dry.  Neurological:     General: No focal deficit present.     Mental Status: He is alert and oriented to person, place, and time. Mental status is at baseline.  Psychiatric:        Mood and Affect: Mood normal.        Behavior: Behavior normal.          Scheduled Medications:   apixaban  5 mg Oral BID   atorvastatin  10 mg Oral q1800   Chlorhexidine Gluconate Cloth  6 each Topical Q0600   gabapentin  600 mg Oral BID   hydrocortisone  20 mg Oral Daily   And   hydrocortisone  10 mg Oral QHS   insulin aspart  0-20 Units Subcutaneous TID WC   insulin aspart  0-5 Units Subcutaneous QHS   insulin aspart  10 Units Subcutaneous TID WC   insulin glargine-yfgn  20 Units Subcutaneous QHS   levothyroxine  112 mcg Oral Q0600    Continuous Infusions:   PRN Medications:  acetaminophen **OR** acetaminophen, dextrose, hydrALAZINE, nitroGLYCERIN, ondansetron **OR** ondansetron (ZOFRAN) IV, senna-docusate  Antimicrobials from admission:  Anti-infectives (From admission, onward)    Start     Dose/Rate Route Frequency Ordered  Stop   10/18/23 1400  vancomycin (VANCOREADY) IVPB 1250 mg/250 mL  Status:  Discontinued        1,250 mg 166.7 mL/hr over 90 Minutes Intravenous Every 24 hours 10/17/23 1535 10/18/23 1102   10/18/23 1400  vancomycin (VANCOREADY) IVPB 750 mg/150 mL  Status:  Discontinued        750 mg 150 mL/hr over 60 Minutes Intravenous Every 24 hours 10/18/23 1102 10/18/23 1332   10/17/23 2300  metroNIDAZOLE (FLAGYL) IVPB 500 mg  Status:  Discontinued        500 mg 100 mL/hr over 60 Minutes Intravenous Every 12 hours 10/17/23 1309 10/18/23 1332   10/17/23 2200  ceFEPIme (MAXIPIME) 2 g in sodium chloride 0.9 % 100 mL IVPB  Status:  Discontinued        2 g 200 mL/hr over 30 Minutes Intravenous Every 12 hours 10/17/23 1535 10/18/23 1332   10/17/23 1345  vancomycin (VANCOCIN) IVPB 1000 mg/200 mL premix        1,000 mg 200 mL/hr over 60 Minutes Intravenous  Once 10/17/23 1337 10/17/23 1542   10/17/23 1115  aztreonam (AZACTAM) 2 g in sodium chloride 0.9 % 100 mL IVPB        2 g 200 mL/hr over 30 Minutes Intravenous  Once 10/17/23 1113 10/17/23 1158   10/17/23 1115  metroNIDAZOLE (FLAGYL) IVPB 500 mg        500 mg 100 mL/hr over 60 Minutes Intravenous  Once 10/17/23 1113 10/17/23 1305   10/17/23 1115  vancomycin (VANCOCIN) IVPB 1000 mg/200 mL premix        1,000 mg 200 mL/hr over 60 Minutes Intravenous  Once 10/17/23 1113 10/17/23 1403           Data Reviewed:  I have personally reviewed the following...  CBC: Recent Labs  Lab 10/13/23 0559 10/17/23 1102 10/18/23 0306 10/19/23 0233  WBC 8.4 12.8* 9.1 6.4  NEUTROABS  --  6.7  --   --   HGB 11.4* 15.2 11.4* 11.3*  HCT 33.3* 42.6 32.7* 32.4*  MCV 99.4 96.2 97.6 97.6  PLT 191 265 195 211   Basic Metabolic Panel: Recent Labs  Lab 10/13/23 0559 10/17/23 1102 10/17/23 1342 10/17/23 1729 10/17/23 1845 10/18/23 0942 10/19/23 0233  NA 134*   < > 132* 132* 133* 133* 133*  K 3.8   < > 3.5 3.5 3.2* 3.8 3.6  CL 103   < > 94* 96* 96* 101 100   CO2 23   < > 26 26 25 23 25   GLUCOSE 335*   < > 248* 206* 188* 190* 203*  BUN 32*   < > 17 16 16 12 18   CREATININE 1.52*   < > 1.38* 1.30* 1.34* 1.31* 1.38*  CALCIUM 8.5*   < > 7.9* 8.2* 8.4* 8.4* 8.4*  MG 2.2  --  1.8  --   --   --   --    < > = values in this interval not displayed.   GFR: Estimated Creatinine Clearance: 53.7 mL/min (A) (by C-G formula based on SCr of 1.38 mg/dL (H)). Liver Function Tests: Recent Labs  Lab 10/13/23 0559 10/17/23 1102  AST 13* 16  ALT 9 13  ALKPHOS 54 61  BILITOT 0.8 1.4*  PROT 6.7 7.7  ALBUMIN 3.1* 3.5   No results for input(s): "LIPASE", "AMYLASE" in the last 168 hours. No results for input(s): "AMMONIA" in the last 168 hours. Coagulation Profile: Recent Labs  Lab 10/17/23 1144  INR 1.2   Cardiac Enzymes: Recent Labs  Lab 10/18/23 0942  CKTOTAL 33*   BNP (last 3 results) No results for input(s): "PROBNP" in the last 8760 hours. HbA1C: No results for input(s): "HGBA1C" in the last 72 hours. CBG: Recent Labs  Lab 10/18/23 1105 10/18/23 1717 10/18/23 2119 10/19/23 0741 10/19/23 1211  GLUCAP 326* 345* 279* 284* 428*   Lipid Profile: No results for input(s): "CHOL", "HDL", "LDLCALC", "TRIG", "CHOLHDL", "LDLDIRECT" in the last 72 hours. Thyroid Function Tests: No results for input(s): "TSH", "T4TOTAL", "FREET4", "T3FREE", "THYROIDAB" in the last 72 hours. Anemia Panel: Recent Labs    10/17/23 1630  VITAMINB12 335   Most Recent Urinalysis On File:     Component Value Date/Time   COLORURINE YELLOW (A)  10/17/2023 1102   APPEARANCEUR CLOUDY (A) 10/17/2023 1102   LABSPEC 1.016 10/17/2023 1102   PHURINE 6.0 10/17/2023 1102   GLUCOSEU 150 (A) 10/17/2023 1102   HGBUR SMALL (A) 10/17/2023 1102   BILIRUBINUR NEGATIVE 10/17/2023 1102   BILIRUBINUR negative 02/23/2018 1048   BILIRUBINUR neg 03/01/2015 1132   KETONESUR 20 (A) 10/17/2023 1102   PROTEINUR 30 (A) 10/17/2023 1102   UROBILINOGEN 0.2 02/23/2018 1048   NITRITE  NEGATIVE 10/17/2023 1102   LEUKOCYTESUR TRACE (A) 10/17/2023 1102   Sepsis Labs: @LABRCNTIP (procalcitonin:4,lacticidven:4) Microbiology: Recent Results (from the past 240 hours)  Blood Culture (routine x 2)     Status: None   Collection Time: 10/10/23  9:22 PM   Specimen: BLOOD LEFT HAND  Result Value Ref Range Status   Specimen Description BLOOD LEFT HAND  Final   Special Requests   Final    BOTTLES DRAWN AEROBIC AND ANAEROBIC Blood Culture adequate volume   Culture   Final    NO GROWTH 5 DAYS Performed at [facility], [address] [place], [place] [address]    Report Status 10/15/2023 FINAL  Final  Blood Culture (routine x 2)     Status: None   Collection Time: 10/10/23  9:22 PM   Specimen: Left Antecubital; Blood  Result Value Ref Range Status   Specimen Description LEFT ANTECUBITAL  Final   Special Requests   Final    BOTTLES DRAWN AEROBIC AND ANAEROBIC Blood Culture adequate volume   Culture   Final    NO GROWTH 5 DAYS Performed at [facility], [address] [place], [place] [address]    Report Status 10/15/2023 FINAL  Final  Resp panel by RT-PCR (RSV, Flu A&B, Covid) Anterior Nasal Swab     Status: Abnormal   Collection Time: 10/11/23 12:38 AM   Specimen: Anterior Nasal Swab  Result Value Ref Range Status   SARS Coronavirus 2 by RT PCR NEGATIVE NEGATIVE Final    Comment: (NOTE) SARS-CoV-2 target nucleic acids are NOT DETECTED.  The SARS-CoV-2 RNA is generally detectable in upper respiratory specimens during the acute phase of infection. The lowest concentration of SARS-CoV-2 viral copies this assay can detect is 138 copies/mL. A negative result does not preclude SARS-Cov-2 infection and should not be used as the sole basis for treatment or other patient management decisions. A negative result may occur with  improper specimen collection/handling, submission of specimen other than nasopharyngeal swab, presence of viral mutation(s) within  the areas targeted by this assay, and inadequate number of viral copies(<138 copies/mL). A negative result must be combined with clinical observations, patient history, and epidemiological information. The expected result is Negative.  Fact Sheet for Patients:  BloggerCourse.com  Fact Sheet for Healthcare Providers:  SeriousBroker.it  This test is no t yet approved or cleared by the [place] FDA and  has been authorized for detection and/or diagnosis of SARS-CoV-2 by FDA under an Emergency Use Authorization (EUA). This EUA will remain  in effect (meaning this test can be used) for the duration of the COVID-19 declaration under Section 564(b)(1) of the Act, 21 U.S.C.section 360bbb-3(b)(1), unless the authorization is terminated  or revoked sooner.       Influenza A by PCR NEGATIVE NEGATIVE Final   Influenza B by PCR NEGATIVE NEGATIVE Final    Comment: (NOTE) The Xpert Xpress SARS-CoV-2/FLU/RSV plus assay is intended as an aid in the diagnosis of influenza from Nasopharyngeal swab specimens and should not be used as a sole basis  for treatment. Nasal washings and aspirates are unacceptable for Xpert Xpress SARS-CoV-2/FLU/RSV testing.  Fact Sheet for Patients: BloggerCourse.com  Fact Sheet for Healthcare Providers: SeriousBroker.it  This test is not yet approved or cleared by the [place] FDA and has been authorized for detection and/or diagnosis of SARS-CoV-2 by FDA under an Emergency Use Authorization (EUA). This EUA will remain in effect (meaning this test can be used) for the duration of the COVID-19 declaration under Section 564(b)(1) of the Act, 21 U.S.C. section 360bbb-3(b)(1), unless the authorization is terminated or revoked.     Resp Syncytial Virus by PCR POSITIVE (A) NEGATIVE Final    Comment: (NOTE) Fact Sheet for  Patients: BloggerCourse.com  Fact Sheet for Healthcare Providers: SeriousBroker.it  This test is not yet approved or cleared by the [place] FDA and has been authorized for detection and/or diagnosis of SARS-CoV-2 by FDA under an Emergency Use Authorization (EUA). This EUA will remain in effect (meaning this test can be used) for the duration of the COVID-19 declaration under Section 564(b)(1) of the Act, 21 U.S.C. section 360bbb-3(b)(1), unless the authorization is terminated or revoked.  Performed at [facility], [address] [place], [place] [address]   MRSA Next Gen by PCR, Nasal     Status: None   Collection Time: 10/11/23  1:53 AM   Specimen: Nasal Mucosa; Nasal Swab  Result Value Ref Range Status   MRSA by PCR Next Gen NOT DETECTED NOT DETECTED Final    Comment: (NOTE) The GeneXpert MRSA Assay (FDA approved for NASAL specimens only), is one component of a comprehensive MRSA colonization surveillance program. It is not intended to diagnose MRSA infection nor to guide or monitor treatment for MRSA infections. Test performance is not FDA approved in patients less than 53 years old. Performed at [facility], [address] [place], [place] [address]   MRSA Next Gen by PCR, Nasal     Status: None   Collection Time: 10/17/23 10:36 AM   Specimen: Nasal Mucosa; Nasal Swab  Result Value Ref Range Status   MRSA by PCR Next Gen NOT DETECTED NOT DETECTED Final    Comment: (NOTE) The GeneXpert MRSA Assay (FDA approved for NASAL specimens only), is one component of a comprehensive MRSA colonization surveillance program. It is not intended to diagnose MRSA infection nor to guide or monitor treatment for MRSA infections. Test performance is not FDA approved in patients less than 13 years old. Performed at [facility], [address] [place], [place] [address]   Urine Culture     Status:  None   Collection Time: 10/17/23 11:02 AM   Specimen: Urine, Random  Result Value Ref Range Status   Specimen Description   Final    URINE, RANDOM Performed at [facility], [address] [place], [place] [address]    Special Requests   Final    NONE Reflexed from [id number] Performed at [facility], [address] [place], [place] [address]    Culture   Final    NO GROWTH Performed at [facility], [address], [place], [place] [address]    Report Status 10/18/2023 FINAL  Final  Resp panel by RT-PCR (RSV, Flu A&B, Covid) Anterior Nasal Swab     Status: Abnormal   Collection Time: 10/17/23 11:12 AM   Specimen: Anterior Nasal Swab  Result Value Ref Range Status   SARS Coronavirus 2 by RT PCR NEGATIVE NEGATIVE Final    Comment: (NOTE)  SARS-CoV-2 target nucleic acids are NOT DETECTED.  The SARS-CoV-2 RNA is generally detectable in upper respiratory specimens during the acute phase of infection. The lowest concentration of SARS-CoV-2 viral copies this assay can detect is 138 copies/mL. A negative result does not preclude SARS-Cov-2 infection and should not be used as the sole basis for treatment or other patient management decisions. A negative result may occur with  improper specimen collection/handling, submission of specimen other than nasopharyngeal swab, presence of viral mutation(s) within the areas targeted by this assay, and inadequate number of viral copies(<138 copies/mL). A negative result must be combined with clinical observations, patient history, and epidemiological information. The expected result is Negative.  Fact Sheet for Patients:  BloggerCourse.com  Fact Sheet for Healthcare Providers:  SeriousBroker.it  This test is no t yet approved or cleared by the [place] FDA and  has been authorized for detection and/or diagnosis of SARS-CoV-2 by FDA under an Emergency Use  Authorization (EUA). This EUA will remain  in effect (meaning this test can be used) for the duration of the COVID-19 declaration under Section 564(b)(1) of the Act, 21 U.S.C.section 360bbb-3(b)(1), unless the authorization is terminated  or revoked sooner.       Influenza A by PCR NEGATIVE NEGATIVE Final   Influenza B by PCR NEGATIVE NEGATIVE Final    Comment: (NOTE) The Xpert Xpress SARS-CoV-2/FLU/RSV plus assay is intended as an aid in the diagnosis of influenza from Nasopharyngeal swab specimens and should not be used as a sole basis for treatment. Nasal washings and aspirates are unacceptable for Xpert Xpress SARS-CoV-2/FLU/RSV testing.  Fact Sheet for Patients: BloggerCourse.com  Fact Sheet for Healthcare Providers: SeriousBroker.it  This test is not yet approved or cleared by the [place] FDA and has been authorized for detection and/or diagnosis of SARS-CoV-2 by FDA under an Emergency Use Authorization (EUA). This EUA will remain in effect (meaning this test can be used) for the duration of the COVID-19 declaration under Section 564(b)(1) of the Act, 21 U.S.C. section 360bbb-3(b)(1), unless the authorization is terminated or revoked.     Resp Syncytial Virus by PCR POSITIVE (A) NEGATIVE Final    Comment: (NOTE) Fact Sheet for Patients: BloggerCourse.com  Fact Sheet for Healthcare Providers: SeriousBroker.it  This test is not yet approved or cleared by the [place] FDA and has been authorized for detection and/or diagnosis of SARS-CoV-2 by FDA under an Emergency Use Authorization (EUA). This EUA will remain in effect (meaning this test can be used) for the duration of the COVID-19 declaration under Section 564(b)(1) of the Act, 21 U.S.C. section 360bbb-3(b)(1), unless the authorization is terminated or revoked.  Performed at [facility], [address] [place], [place] [address]   Blood Culture (routine x 2)     Status: None (Preliminary result)   Collection Time: 10/17/23 11:43 AM   Specimen: BLOOD  Result Value Ref Range Status   Specimen Description BLOOD RIGHT ANTECUBITAL  Final   Special Requests   Final    BOTTLES DRAWN AEROBIC AND ANAEROBIC Blood Culture results may not be optimal due to an inadequate volume of blood received in culture bottles   Culture   Final    NO GROWTH 2 DAYS Performed at [facility], [address], [place], [place] [address]    Report Status PENDING  Incomplete  Blood Culture (routine x 2)     Status: None (Preliminary result)   Collection Time: 10/17/23 11:43 AM   Specimen: BLOOD  Result  Value Ref Range Status   Specimen Description BLOOD BLOOD RIGHT FOREARM  Final   Special Requests   Final    BOTTLES DRAWN AEROBIC AND ANAEROBIC Blood Culture results may not be optimal due to an inadequate volume of blood received in culture bottles   Culture   Final    NO GROWTH 2 DAYS Performed at [facility], [address] [place], [place] [address]    Report Status PENDING  Incomplete      Radiology Studies last 3 days: ECHOCARDIOGRAM COMPLETE Result Date: 10/19/2023    ECHOCARDIOGRAM REPORT   Patient Name:   JAVEN HINDERLITER [facility] Date of Exam: 10/18/2023 Medical Rec #:  [id number]      Height:       70.0 in Accession #:    [id number]     Weight:       249.1 lb Date of Birth:  July 20, 1941      BSA:          2.292 m Patient Age:    82 years       BP:           118/55 mmHg Patient Gender: M              HR:           84 bpm. Exam Location:  [facility] Procedure: 2D Echo and Intracardiac Opacification Agent (Both Spectral and Color            Flow Doppler were utilized during procedure). Indications:     Chest Pain R07.9  History:         Patient has no prior history of Echocardiogram examinations.  Sonographer:     Overton Mam RDCS, FASE Referring Phys:  [id number] Sunnie Nielsen  Diagnosing Phys: Debbe Odea MD  Sonographer Comments: Suboptimal apical window and patient is obese. Image acquisition challenging due to patient body habitus and Image acquisition challenging due to respiratory motion. IMPRESSIONS  1. Left ventricular ejection fraction, by estimation, is 65 to 70%. Left ventricular ejection fraction by 2D MOD biplane is 68.4 %. The left ventricle has normal function. The left ventricle has no regional wall motion abnormalities. Left ventricular diastolic parameters were normal.  2. Right ventricular systolic function is normal. The right ventricular size is normal.  3. The mitral valve is normal in structure. Trivial mitral valve regurgitation.  4. The aortic valve is calcified. Aortic valve regurgitation is not visualized. Aortic valve sclerosis/calcification is present, without any evidence of aortic stenosis.  5. The inferior vena cava is normal in size with greater than 50% respiratory variability, suggesting right atrial pressure of 3 mmHg. FINDINGS  Left Ventricle: Left ventricular ejection fraction, by estimation, is 65 to 70%. Left ventricular ejection fraction by 2D MOD biplane is 68.4 %. The left ventricle has normal function. The left ventricle has no regional wall motion abnormalities. Definity contrast agent was given IV to delineate the left ventricular endocardial borders. Strain imaging was not performed. The left ventricular internal cavity size was normal in size. There is no left ventricular hypertrophy. Left ventricular diastolic parameters were normal. Right Ventricle: The right ventricular size is normal. No increase in right ventricular wall thickness. Right ventricular systolic function is normal. Left Atrium: Left atrial size was normal in size. Right Atrium: Right atrial size was normal in size. Pericardium: There is no evidence of pericardial effusion. Mitral Valve: The mitral valve is normal in structure. Trivial mitral valve regurgitation.  Tricuspid Valve: The tricuspid valve is  normal in structure. Tricuspid valve regurgitation is not demonstrated. Aortic Valve: The aortic valve is calcified. Aortic valve regurgitation is not visualized. Aortic valve sclerosis/calcification is present, without any evidence of aortic stenosis. Aortic valve peak gradient measures 9.5 mmHg. Pulmonic Valve: The pulmonic valve was not well visualized. Pulmonic valve regurgitation is not visualized. Aorta: The aortic root is normal in size and structure. Venous: The inferior vena cava is normal in size with greater than 50% respiratory variability, suggesting right atrial pressure of 3 mmHg. IAS/Shunts: No atrial level shunt detected by color flow Doppler. Additional Comments: 3D imaging was not performed.  LEFT VENTRICLE PLAX 2D                        Biplane EF (MOD) LVIDd:         4.40 cm         LV Biplane EF:   Left LVIDs:         3.10 cm                          ventricular LV PW:         1.20 cm                          ejection LV IVS:        1.00 cm                          fraction by LVOT diam:     2.00 cm                          2D MOD LV SV:         71                               biplane is LV SV Index:   31                               68.4 %. LVOT Area:     3.14 cm                                Diastology                                LV e' medial:    6.09 cm/s LV Volumes (MOD)               LV E/e' medial:  10.2 LV vol d, MOD    67.4 ml       LV e' lateral:   9.68 cm/s A2C:                           LV E/e' lateral: 6.4 LV vol d, MOD    65.3 ml A4C: LV vol s, MOD    21.3 ml A2C: LV vol s, MOD    20.9 ml A4C: LV SV MOD A2C:   46.1 ml LV SV MOD A4C:   65.3 ml LV SV MOD BP:    46.9 ml LEFT ATRIUM  Index        RIGHT ATRIUM           Index LA diam:      3.50 cm 1.53 cm/m   RA Area:     13.20 cm LA Vol (A2C): 18.4 ml 8.03 ml/m   RA Volume:   32.80 ml  14.31 ml/m LA Vol (A4C): 53.8 ml 23.48 ml/m  AORTIC VALVE                 PULMONIC VALVE AV  Area (Vmax): 2.33 cm     PV Vmax:        1.15 m/s AV Vmax:        154.00 cm/s  PV Peak grad:   5.3 mmHg AV Peak Grad:   9.5 mmHg     RVOT Peak grad: 6 mmHg LVOT Vmax:      114.00 cm/s LVOT Vmean:     74.300 cm/s LVOT VTI:       0.225 m  AORTA Ao Root diam: 3.50 cm Ao Asc diam:  2.80 cm MITRAL VALVE MV Area (PHT): 2.57 cm    SHUNTS MV Decel Time: 295 msec    Systemic VTI:  0.22 m MV E velocity: 62.20 cm/s  Systemic Diam: 2.00 cm MV A velocity: 72.70 cm/s MV E/A ratio:  0.86 Debbe Odea MD Electronically signed by Debbe Odea MD Signature Date/Time: 10/19/2023/2:45:03 PM    Final    DG Chest Portable 1 View Result Date: 10/17/2023 CLINICAL DATA:  sepsis EXAM: PORTABLE CHEST 1 VIEW COMPARISON:  Chest x-ray 10/10/2023 FINDINGS: Right chest wall Port-A-Cath with tip overlying the right atrium. The heart and mediastinal contours are unchanged. Atherosclerotic plaque. No focal consolidation. No pulmonary edema. No pleural effusion. No pneumothorax. No acute osseous abnormality. IMPRESSION: No active disease. Electronically Signed   By: Tish Frederickson M.D.   On: 10/17/2023 12:47          Sunnie Nielsen, DO [facility] 10/19/2023, 3:19 PM    Dictation software may have been used to generate the above note. Typos may occur and escape review in typed/dictated notes. Please contact Dr Lyn Hollingshead directly for clarity if needed.  Staff may message me via secure chat in Epic  but this may not receive an immediate response,  please page me for urgent matters!  If 7PM-7AM, please contact night coverage [email or web address]

## 2023-10-20 DIAGNOSIS — E8729 Other acidosis: Secondary | ICD-10-CM | POA: Diagnosis not present

## 2023-10-20 LAB — GLUCOSE, CAPILLARY
Glucose-Capillary: 175 mg/dL — ABNORMAL HIGH (ref 70–99)
Glucose-Capillary: 187 mg/dL — ABNORMAL HIGH (ref 70–99)
Glucose-Capillary: 290 mg/dL — ABNORMAL HIGH (ref 70–99)

## 2023-10-20 MED ORDER — INSULIN ASPART 100 UNIT/ML IJ SOLN
8.0000 [IU] | Freq: Three times a day (TID) | INTRAMUSCULAR | Status: DC
  Administered 2023-10-20 (×2): 8 [IU] via SUBCUTANEOUS
  Filled 2023-10-20 (×2): qty 1

## 2023-10-20 MED ORDER — ACETAMINOPHEN 325 MG PO TABS
325.0000 mg | ORAL_TABLET | Freq: Four times a day (QID) | ORAL | Status: AC | PRN
Start: 2023-10-20 — End: ?

## 2023-10-20 NOTE — Discharge Instructions (Signed)
 [facility].  [address] , [place], [place], [address]. [phone] They will call you to arrange when they can come to see you

## 2023-10-20 NOTE — Plan of Care (Signed)
 Patient alert and oriented. Blood glucose this evening was lower than previous nights. Patient felt like his blood glucose would drop later in the evening. I told patient to call if he had symptoms of hypoglycemia. Patient called, bg checked ; 52. Patient was diaphoretic. Patient given orange juice. Upon recheck, bg 76. Patient asymptomatic, stated that he feels better. Will continue to monitor.   Problem: Clinical Measurements: Goal: Diagnostic test results will improve Outcome: Progressing   Problem: Respiratory: Goal: Ability to maintain adequate ventilation will improve Outcome: Progressing   Problem: Metabolic: Goal: Ability to maintain appropriate glucose levels will improve Outcome: Progressing

## 2023-10-20 NOTE — Discharge Summary (Signed)
 Physician Discharge Summary   Patient: Christian [name] MRN: [id number]  DOB: October 10, 1940   Admit:     Date of Admission: 10/17/2023 Admitted from: home   Discharge: Date of discharge: 10/20/23 Disposition: Home health Condition at discharge: good  CODE STATUS: FULL CODE     Discharge Physician: Sunnie Nielsen, DO [facility]     PCP: Ethelda Chick, MD  Recommendations for Outpatient Follow-up:  Follow up with PCP Ethelda Chick, MD in 1-2 weeks Please obtain labs/tests: consider CBC, BMP, consider CXR if still coughing        Discharge Diagnoses: Principal Problem:   High anion gap metabolic acidosis Active Problems:   Uncontrolled type 1 diabetes mellitus with hyperglycemia, with long-term current use of insulin (HCC)   Respiratory syncytial virus (RSV) infection   A-fib (HCC)   Pure hypercholesterolemia   Coronary artery disease   History of bladder cancer   Aortic atherosclerosis (HCC)   Chronic deep vein thrombosis (DVT) of lower extremity (HCC)   Essential hypertension   Retroperitoneal lymphadenopathy   Hyponatremia   Hyperlipidemia   Adrenal insufficiency due to cancer therapy (HCC)   Severe sepsis with acute organ dysfunction (HCC)   Weakness   Hypothyroidism   Chest pressure   Hypokalemia        Hospital course / significant events:   HPI: Christian [name] is an 83 year old male with history of insulin-dependent diabetes mellitus type 1, paroxysmal atrial fibrillation, coronary artery disease, hypertension, history of bladder cancer status post metastasis to intra abdominal lymph nodes, adrenal insufficiency, due to cancer therapy, CKD 3a, who presents to the emergency department for chief concerns of progressive weakness, hypotension, hypoxia to 89% on room air. Recently discharged from hospital 02/17 and was having nausea, worsening weakness over past few days.   02/21: in ED BP 80/62, (+)RSV, hyperglycemia at Glc 257, AG 17,  Beta Hydroxybutyrate 1.41, Lactic 2.7. BS abx w/ flagyl, aztreonam, vanc, LR fluids.  02/22: d/c abx. Suspect dehydration is his primary problem. Reports persistent chest discomfort unchanged over past week, Echo ordered. Increased insulin  02/23: Echo read, no concerns. Glc high, titrating insulin.  02/24: remains stable, Glc improved, has been fine >48 off abx, stable for discharge      Consultants:  none  Procedures/Surgeries: none      ASSESSMENT & PLAN:   Anion gap metabolic acidosis - resovled Borderline DKA - resolved Mild anion gap elevation with beta hydroxybutyrate Insulin per home endocrinologist protocol    A-fib Home apixaban 5 mg p.o. twice daily resumed   Hypokalemia Monitor BMP  Chest pressure w/ coughing - resolved Persistent mild cough likely postviral cough syndrome  Troponins flat  Question viral effect Nitroglycerin sublingual every 5 minutes as needed for chest pain resumed on admission Non-emergent echo ordered --> no pericardial abn or other concerning findings    Hypothyroidism Home levothyroxine 112 mcg daily resumed   Weakness PT, OT ordered for home health    Adrenal insufficiency due to cancer therapy  Home hydrocortisone    CKD3a Monitor BMP  Hyperlipidemia Home atorvastatin resumed on admission   Pseudo-Hyponatremia d/t hyperglycemia Monitor BMP   Essential hypertension Low BP here Continue to hold  home meds BB   Chronic deep vein thrombosis (DVT) of lower extremity  Home apixaban 5 mg p.o. twice daily resumed   History of bladder cancer Follow outpatient   Request abx Educated that he has been off abx for some time now and  is improving, no need to sbx on discharge,  Advised f/u w/ PCP or seek emergency care - given immune compromise I do not want him on abx unless he needs it d/t risk development of resistance             Discharge Instructions  Allergies as of 10/20/2023       Reactions   Ceftriaxone  Hives   Tolerated cefazolin 09/2021 without reaction Received piperacillin/tazobactam in 2019 (after reported ceftriaxone allergy which was in 2018)   Other Hives   Sulfasalazine Hives   Sulfa Antibiotics Hives        Medication List     STOP taking these medications    Apremilast 30 MG Tabs   ciprofloxacin 500 MG tablet Commonly known as: CIPRO   metFORMIN 500 MG 24 hr tablet Commonly known as: GLUCOPHAGE-XR   metoprolol succinate 25 MG 24 hr tablet Commonly known as: TOPROL-XL   midodrine 10 MG tablet Commonly known as: PROAMATINE   Semaglutide(0.25 or 0.5MG /DOS) 2 MG/3ML Sopn       TAKE these medications    acetaminophen 325 MG tablet Commonly known as: TYLENOL Take 1-2 tablets (325-650 mg total) by mouth every 6 (six) hours as needed for mild pain (pain score 1-3), moderate pain (pain score 4-6), fever or headache (or Fever >/= 101).   apixaban 5 MG Tabs tablet Commonly known as: Eliquis Take 1 tablet (5 mg total) by mouth 2 (two) times daily.   atorvastatin 10 MG tablet Commonly known as: LIPITOR Take 1 tablet (10 mg total) by mouth daily at 6 PM.   Dexcom G7 Receiver Hardie Pulley See admin instructions.   Dexcom G7 Sensor Misc USE 1 EACH EVERY 10 DAYS   fluticasone 50 MCG/ACT nasal spray Commonly known as: FLONASE Place 2 sprays into both nostrils as needed.   gabapentin 300 MG capsule Commonly known as: NEURONTIN Take 600 mg by mouth 2 (two) times daily.   hydrocortisone 10 MG tablet Commonly known as: CORTEF Take 20 mg in the morning and 10 mg at night   Insulin Aspart FlexPen 100 UNIT/ML Commonly known as: NOVOLOG Inject 25-40 units subcutaneously before meals three times daily as directed. Max Total Daily Dose 130 units per day.   Lantus SoloStar 100 UNIT/ML Solostar Pen Generic drug: insulin glargine Inject 48 Units into the skin at bedtime.   levothyroxine 112 MCG tablet Commonly known as: SYNTHROID Take 112 mcg by mouth daily before  breakfast.   lidocaine 5 % Commonly known as: LIDODERM Place onto the skin as directed.   nitroGLYCERIN 0.4 MG SL tablet Commonly known as: NITROSTAT Place 1 tablet (0.4 mg total) under the tongue every 5 (five) minutes as needed for chest pain.   OneTouch Delica Plus Lancet33G Misc Apply 1 each topically 3 (three) times daily.   OneTouch Ultra test strip Generic drug: glucose blood 1 each (1 strip total) 3 (three) times daily Use as instructed. Dx code: E11.9          Allergies  Allergen Reactions   Ceftriaxone Hives    Tolerated cefazolin 09/2021 without reaction Received piperacillin/tazobactam in 2019 (after reported ceftriaxone allergy which was in 2018)   Other Hives   Sulfasalazine Hives   Sulfa Antibiotics Hives     Subjective: pt reports some coughing today, declined cough medications. Requests antibiotics, this was discussed as above    Discharge Exam: BP (!) 118/101 (BP Location: Left Arm)   Pulse 70   Temp 98.4 F (36.9 C)  Resp 16   Ht 5\' 10"  (1.778 m)   Wt 113 kg   SpO2 94%   BMI 35.74 kg/m  General: Pt is alert, awake, not in acute distress Cardiovascular: RRR, S1/S2 +, no rubs, no gallops Respiratory: CTA bilaterally, no wheezing, no rhonchi Abdominal: Soft, NT, ND, bowel sounds + Extremities: no edema, no cyanosis     The results of significant diagnostics from this hospitalization (including imaging, microbiology, ancillary and laboratory) are listed below for reference.     Microbiology: Recent Results (from the past 240 hours)  Blood Culture (routine x 2)     Status: None   Collection Time: 10/10/23  9:22 PM   Specimen: BLOOD LEFT HAND  Result Value Ref Range Status   Specimen Description BLOOD LEFT HAND  Final   Special Requests   Final    BOTTLES DRAWN AEROBIC AND ANAEROBIC Blood Culture adequate volume   Culture   Final    NO GROWTH 5 DAYS Performed at [facility], [address], [place], [place] [address]     Report Status 10/15/2023 FINAL  Final  Blood Culture (routine x 2)     Status: None   Collection Time: 10/10/23  9:22 PM   Specimen: Left Antecubital; Blood  Result Value Ref Range Status   Specimen Description LEFT ANTECUBITAL  Final   Special Requests   Final    BOTTLES DRAWN AEROBIC AND ANAEROBIC Blood Culture adequate volume   Culture   Final    NO GROWTH 5 DAYS Performed at [facility], [address] [place], [place] [address]    Report Status 10/15/2023 FINAL  Final  Resp panel by RT-PCR (RSV, Flu A&B, Covid) Anterior Nasal Swab     Status: Abnormal   Collection Time: 10/11/23 12:38 AM   Specimen: Anterior Nasal Swab  Result Value Ref Range Status   SARS Coronavirus 2 by RT PCR NEGATIVE NEGATIVE Final    Comment: (NOTE) SARS-CoV-2 target nucleic acids are NOT DETECTED.  The SARS-CoV-2 RNA is generally detectable in upper respiratory specimens during the acute phase of infection. The lowest concentration of SARS-CoV-2 viral copies this assay can detect is 138 copies/mL. A negative result does not preclude SARS-Cov-2 infection and should not be used as the sole basis for treatment or other patient management decisions. A negative result may occur with  improper specimen collection/handling, submission of specimen other than nasopharyngeal swab, presence of viral mutation(s) within the areas targeted by this assay, and inadequate number of viral copies(<138 copies/mL). A negative result must be combined with clinical observations, patient history, and epidemiological information. The expected result is Negative.  Fact Sheet for Patients:  BloggerCourse.com  Fact Sheet for Healthcare Providers:  SeriousBroker.it  This test is no t yet approved or cleared by the [place] FDA and  has been authorized for detection and/or diagnosis of SARS-CoV-2 by FDA under an Emergency Use Authorization (EUA). This EUA will  remain  in effect (meaning this test can be used) for the duration of the COVID-19 declaration under Section 564(b)(1) of the Act, 21 U.S.C.section 360bbb-3(b)(1), unless the authorization is terminated  or revoked sooner.       Influenza A by PCR NEGATIVE NEGATIVE Final   Influenza B by PCR NEGATIVE NEGATIVE Final    Comment: (NOTE) The Xpert Xpress SARS-CoV-2/FLU/RSV plus assay is intended as an aid in the diagnosis of influenza from Nasopharyngeal swab specimens and should not be used as a sole basis for treatment. Nasal washings  and aspirates are unacceptable for Xpert Xpress SARS-CoV-2/FLU/RSV testing.  Fact Sheet for Patients: BloggerCourse.com  Fact Sheet for Healthcare Providers: SeriousBroker.it  This test is not yet approved or cleared by the [place] FDA and has been authorized for detection and/or diagnosis of SARS-CoV-2 by FDA under an Emergency Use Authorization (EUA). This EUA will remain in effect (meaning this test can be used) for the duration of the COVID-19 declaration under Section 564(b)(1) of the Act, 21 U.S.C. section 360bbb-3(b)(1), unless the authorization is terminated or revoked.     Resp Syncytial Virus by PCR POSITIVE (A) NEGATIVE Final    Comment: (NOTE) Fact Sheet for Patients: BloggerCourse.com  Fact Sheet for Healthcare Providers: SeriousBroker.it  This test is not yet approved or cleared by the [place] FDA and has been authorized for detection and/or diagnosis of SARS-CoV-2 by FDA under an Emergency Use Authorization (EUA). This EUA will remain in effect (meaning this test can be used) for the duration of the COVID-19 declaration under Section 564(b)(1) of the Act, 21 U.S.C. section 360bbb-3(b)(1), unless the authorization is terminated or revoked.  Performed at [facility], [address] [place], [place]  [address]   MRSA Next Gen by PCR, Nasal     Status: None   Collection Time: 10/11/23  1:53 AM   Specimen: Nasal Mucosa; Nasal Swab  Result Value Ref Range Status   MRSA by PCR Next Gen NOT DETECTED NOT DETECTED Final    Comment: (NOTE) The GeneXpert MRSA Assay (FDA approved for NASAL specimens only), is one component of a comprehensive MRSA colonization surveillance program. It is not intended to diagnose MRSA infection nor to guide or monitor treatment for MRSA infections. Test performance is not FDA approved in patients less than 62 years old. Performed at [facility], [address] [place], [place] [address]   MRSA Next Gen by PCR, Nasal     Status: None   Collection Time: 10/17/23 10:36 AM   Specimen: Nasal Mucosa; Nasal Swab  Result Value Ref Range Status   MRSA by PCR Next Gen NOT DETECTED NOT DETECTED Final    Comment: (NOTE) The GeneXpert MRSA Assay (FDA approved for NASAL specimens only), is one component of a comprehensive MRSA colonization surveillance program. It is not intended to diagnose MRSA infection nor to guide or monitor treatment for MRSA infections. Test performance is not FDA approved in patients less than 79 years old. Performed at [facility], [address], [place], [place] [address]   Urine Culture     Status: None   Collection Time: 10/17/23 11:02 AM   Specimen: Urine, Random  Result Value Ref Range Status   Specimen Description   Final    URINE, RANDOM Performed at [facility], [address] [place], [place] [address]    Special Requests   Final    NONE Reflexed from [id number] Performed at [facility], [address], [place], [place] [address]    Culture   Final    NO GROWTH Performed at [facility], [address] [place], [place] [address]    Report Status 10/18/2023 FINAL  Final  Resp panel by RT-PCR (RSV, Flu A&B, Covid) Anterior Nasal Swab     Status: Abnormal   Collection Time:  10/17/23 11:12 AM   Specimen: Anterior Nasal Swab  Result Value Ref Range Status   SARS Coronavirus 2 by RT PCR NEGATIVE NEGATIVE Final    Comment: (NOTE) SARS-CoV-2 target nucleic acids  are NOT DETECTED.  The SARS-CoV-2 RNA is generally detectable in upper respiratory specimens during the acute phase of infection. The lowest concentration of SARS-CoV-2 viral copies this assay can detect is 138 copies/mL. A negative result does not preclude SARS-Cov-2 infection and should not be used as the sole basis for treatment or other patient management decisions. A negative result may occur with  improper specimen collection/handling, submission of specimen other than nasopharyngeal swab, presence of viral mutation(s) within the areas targeted by this assay, and inadequate number of viral copies(<138 copies/mL). A negative result must be combined with clinical observations, patient history, and epidemiological information. The expected result is Negative.  Fact Sheet for Patients:  BloggerCourse.com  Fact Sheet for Healthcare Providers:  SeriousBroker.it  This test is no t yet approved or cleared by the [place] FDA and  has been authorized for detection and/or diagnosis of SARS-CoV-2 by FDA under an Emergency Use Authorization (EUA). This EUA will remain  in effect (meaning this test can be used) for the duration of the COVID-19 declaration under Section 564(b)(1) of the Act, 21 U.S.C.section 360bbb-3(b)(1), unless the authorization is terminated  or revoked sooner.       Influenza A by PCR NEGATIVE NEGATIVE Final   Influenza B by PCR NEGATIVE NEGATIVE Final    Comment: (NOTE) The Xpert Xpress SARS-CoV-2/FLU/RSV plus assay is intended as an aid in the diagnosis of influenza from Nasopharyngeal swab specimens and should not be used as a sole basis for treatment. Nasal washings and aspirates are unacceptable for Xpert Xpress  SARS-CoV-2/FLU/RSV testing.  Fact Sheet for Patients: BloggerCourse.com  Fact Sheet for Healthcare Providers: SeriousBroker.it  This test is not yet approved or cleared by the [place] FDA and has been authorized for detection and/or diagnosis of SARS-CoV-2 by FDA under an Emergency Use Authorization (EUA). This EUA will remain in effect (meaning this test can be used) for the duration of the COVID-19 declaration under Section 564(b)(1) of the Act, 21 U.S.C. section 360bbb-3(b)(1), unless the authorization is terminated or revoked.     Resp Syncytial Virus by PCR POSITIVE (A) NEGATIVE Final    Comment: (NOTE) Fact Sheet for Patients: BloggerCourse.com  Fact Sheet for Healthcare Providers: SeriousBroker.it  This test is not yet approved or cleared by the [place] FDA and has been authorized for detection and/or diagnosis of SARS-CoV-2 by FDA under an Emergency Use Authorization (EUA). This EUA will remain in effect (meaning this test can be used) for the duration of the COVID-19 declaration under Section 564(b)(1) of the Act, 21 U.S.C. section 360bbb-3(b)(1), unless the authorization is terminated or revoked.  Performed at [facility], [address] [place], [place] [address]   Blood Culture (routine x 2)     Status: None (Preliminary result)   Collection Time: 10/17/23 11:43 AM   Specimen: BLOOD  Result Value Ref Range Status   Specimen Description BLOOD RIGHT ANTECUBITAL  Final   Special Requests   Final    BOTTLES DRAWN AEROBIC AND ANAEROBIC Blood Culture results may not be optimal due to an inadequate volume of blood received in culture bottles   Culture   Final    NO GROWTH 3 DAYS Performed at [facility], [address] [place], [place] [address]    Report Status PENDING  Incomplete  Blood Culture (routine x 2)     Status: None  (Preliminary result)   Collection Time: 10/17/23 11:43 AM   Specimen: BLOOD  Result Value Ref Range Status  Specimen Description BLOOD BLOOD RIGHT FOREARM  Final   Special Requests   Final    BOTTLES DRAWN AEROBIC AND ANAEROBIC Blood Culture results may not be optimal due to an inadequate volume of blood received in culture bottles   Culture   Final    NO GROWTH 3 DAYS Performed at [facility], [address] [place], [place] [address]    Report Status PENDING  Incomplete     Labs: BNP (last 3 results) Recent Labs    10/17/23 1143  BNP 150.0*   Basic Metabolic Panel: Recent Labs  Lab 10/17/23 1342 10/17/23 1729 10/17/23 1845 10/18/23 0942 10/19/23 0233  NA 132* 132* 133* 133* 133*  K 3.5 3.5 3.2* 3.8 3.6  CL 94* 96* 96* 101 100  CO2 26 26 25 23 25   GLUCOSE 248* 206* 188* 190* 203*  BUN 17 16 16 12 18   CREATININE 1.38* 1.30* 1.34* 1.31* 1.38*  CALCIUM 7.9* 8.2* 8.4* 8.4* 8.4*  MG 1.8  --   --   --   --    Liver Function Tests: Recent Labs  Lab 10/17/23 1102  AST 16  ALT 13  ALKPHOS 61  BILITOT 1.4*  PROT 7.7  ALBUMIN 3.5   No results for input(s): "LIPASE", "AMYLASE" in the last 168 hours. No results for input(s): "AMMONIA" in the last 168 hours. CBC: Recent Labs  Lab 10/17/23 1102 10/18/23 0306 10/19/23 0233  WBC 12.8* 9.1 6.4  NEUTROABS 6.7  --   --   HGB 15.2 11.4* 11.3*  HCT 42.6 32.7* 32.4*  MCV 96.2 97.6 97.6  PLT 265 195 211   Cardiac Enzymes: Recent Labs  Lab 10/18/23 0942  CKTOTAL 33*   BNP: Invalid input(s): "POCBNP" CBG: Recent Labs  Lab 10/19/23 2114 10/19/23 2331 10/19/23 2352 10/20/23 0743 10/20/23 1123  GLUCAP 101* 52* 76 187* 290*   D-Dimer No results for input(s): "DDIMER" in the last 72 hours. Hgb A1c No results for input(s): "HGBA1C" in the last 72 hours. Lipid Profile No results for input(s): "CHOL", "HDL", "LDLCALC", "TRIG", "CHOLHDL", "LDLDIRECT" in the last 72 hours. Thyroid function  studies No results for input(s): "TSH", "T4TOTAL", "T3FREE", "THYROIDAB" in the last 72 hours.  Invalid input(s): "FREET3" Anemia work up Recent Labs    10/17/23 1630  VITAMINB12 335   Urinalysis    Component Value Date/Time   COLORURINE YELLOW (A) 10/17/2023 1102   APPEARANCEUR CLOUDY (A) 10/17/2023 1102   LABSPEC 1.016 10/17/2023 1102   PHURINE 6.0 10/17/2023 1102   GLUCOSEU 150 (A) 10/17/2023 1102   HGBUR SMALL (A) 10/17/2023 1102   BILIRUBINUR NEGATIVE 10/17/2023 1102   BILIRUBINUR negative 02/23/2018 1048   BILIRUBINUR neg 03/01/2015 1132   KETONESUR 20 (A) 10/17/2023 1102   PROTEINUR 30 (A) 10/17/2023 1102   UROBILINOGEN 0.2 02/23/2018 1048   NITRITE NEGATIVE 10/17/2023 1102   LEUKOCYTESUR TRACE (A) 10/17/2023 1102   Sepsis Labs Recent Labs  Lab 10/17/23 1102 10/18/23 0306 10/19/23 0233  WBC 12.8* 9.1 6.4   Microbiology Recent Results (from the past 240 hours)  Blood Culture (routine x 2)     Status: None   Collection Time: 10/10/23  9:22 PM   Specimen: BLOOD LEFT HAND  Result Value Ref Range Status   Specimen Description BLOOD LEFT HAND  Final   Special Requests   Final    BOTTLES DRAWN AEROBIC AND ANAEROBIC Blood Culture adequate volume   Culture   Final    NO GROWTH 5 DAYS Performed  at [facility], [address] [place], [place] [address]    Report Status 10/15/2023 FINAL  Final  Blood Culture (routine x 2)     Status: None   Collection Time: 10/10/23  9:22 PM   Specimen: Left Antecubital; Blood  Result Value Ref Range Status   Specimen Description LEFT ANTECUBITAL  Final   Special Requests   Final    BOTTLES DRAWN AEROBIC AND ANAEROBIC Blood Culture adequate volume   Culture   Final    NO GROWTH 5 DAYS Performed at [facility], [address] [place], [place] [address]    Report Status 10/15/2023 FINAL  Final  Resp panel by RT-PCR (RSV, Flu A&B, Covid) Anterior Nasal Swab     Status: Abnormal   Collection Time: 10/11/23  12:38 AM   Specimen: Anterior Nasal Swab  Result Value Ref Range Status   SARS Coronavirus 2 by RT PCR NEGATIVE NEGATIVE Final    Comment: (NOTE) SARS-CoV-2 target nucleic acids are NOT DETECTED.  The SARS-CoV-2 RNA is generally detectable in upper respiratory specimens during the acute phase of infection. The lowest concentration of SARS-CoV-2 viral copies this assay can detect is 138 copies/mL. A negative result does not preclude SARS-Cov-2 infection and should not be used as the sole basis for treatment or other patient management decisions. A negative result may occur with  improper specimen collection/handling, submission of specimen other than nasopharyngeal swab, presence of viral mutation(s) within the areas targeted by this assay, and inadequate number of viral copies(<138 copies/mL). A negative result must be combined with clinical observations, patient history, and epidemiological information. The expected result is Negative.  Fact Sheet for Patients:  BloggerCourse.com  Fact Sheet for Healthcare Providers:  SeriousBroker.it  This test is no t yet approved or cleared by the [place] FDA and  has been authorized for detection and/or diagnosis of SARS-CoV-2 by FDA under an Emergency Use Authorization (EUA). This EUA will remain  in effect (meaning this test can be used) for the duration of the COVID-19 declaration under Section 564(b)(1) of the Act, 21 U.S.C.section 360bbb-3(b)(1), unless the authorization is terminated  or revoked sooner.       Influenza A by PCR NEGATIVE NEGATIVE Final   Influenza B by PCR NEGATIVE NEGATIVE Final    Comment: (NOTE) The Xpert Xpress SARS-CoV-2/FLU/RSV plus assay is intended as an aid in the diagnosis of influenza from Nasopharyngeal swab specimens and should not be used as a sole basis for treatment. Nasal washings and aspirates are unacceptable for Xpert Xpress  SARS-CoV-2/FLU/RSV testing.  Fact Sheet for Patients: BloggerCourse.com  Fact Sheet for Healthcare Providers: SeriousBroker.it  This test is not yet approved or cleared by the [place] FDA and has been authorized for detection and/or diagnosis of SARS-CoV-2 by FDA under an Emergency Use Authorization (EUA). This EUA will remain in effect (meaning this test can be used) for the duration of the COVID-19 declaration under Section 564(b)(1) of the Act, 21 U.S.C. section 360bbb-3(b)(1), unless the authorization is terminated or revoked.     Resp Syncytial Virus by PCR POSITIVE (A) NEGATIVE Final    Comment: (NOTE) Fact Sheet for Patients: BloggerCourse.com  Fact Sheet for Healthcare Providers: SeriousBroker.it  This test is not yet approved or cleared by the [place] FDA and has been authorized for detection and/or diagnosis of SARS-CoV-2 by FDA under an Emergency Use Authorization (EUA). This EUA will remain in effect (meaning this test can be used) for the duration of the  COVID-19 declaration under Section 564(b)(1) of the Act, 21 U.S.C. section 360bbb-3(b)(1), unless the authorization is terminated or revoked.  Performed at [facility], [address] [place], [place] [address]   MRSA Next Gen by PCR, Nasal     Status: None   Collection Time: 10/11/23  1:53 AM   Specimen: Nasal Mucosa; Nasal Swab  Result Value Ref Range Status   MRSA by PCR Next Gen NOT DETECTED NOT DETECTED Final    Comment: (NOTE) The GeneXpert MRSA Assay (FDA approved for NASAL specimens only), is one component of a comprehensive MRSA colonization surveillance program. It is not intended to diagnose MRSA infection nor to guide or monitor treatment for MRSA infections. Test performance is not FDA approved in patients less than 63 years old. Performed at [facility], [address] [place], [place] [address]   MRSA Next Gen by PCR, Nasal     Status: None   Collection Time: 10/17/23 10:36 AM   Specimen: Nasal Mucosa; Nasal Swab  Result Value Ref Range Status   MRSA by PCR Next Gen NOT DETECTED NOT DETECTED Final    Comment: (NOTE) The GeneXpert MRSA Assay (FDA approved for NASAL specimens only), is one component of a comprehensive MRSA colonization surveillance program. It is not intended to diagnose MRSA infection nor to guide or monitor treatment for MRSA infections. Test performance is not FDA approved in patients less than 75 years old. Performed at [facility], [address], [place], [place] [address]   Urine Culture     Status: None   Collection Time: 10/17/23 11:02 AM   Specimen: Urine, Random  Result Value Ref Range Status   Specimen Description   Final    URINE, RANDOM Performed at [facility], [address], [place], [place] [address]    Special Requests   Final    NONE Reflexed from [id number] Performed at [facility], [address], [place], [place] [address]    Culture   Final    NO GROWTH Performed at [facility], [address] [place], [place] [address]    Report Status 10/18/2023 FINAL  Final  Resp panel by RT-PCR (RSV, Flu A&B, Covid) Anterior Nasal Swab     Status: Abnormal   Collection Time: 10/17/23 11:12 AM   Specimen: Anterior Nasal Swab  Result Value Ref Range Status   SARS Coronavirus 2 by RT PCR NEGATIVE NEGATIVE Final    Comment: (NOTE) SARS-CoV-2 target nucleic acids are NOT DETECTED.  The SARS-CoV-2 RNA is generally detectable in upper respiratory specimens during the acute phase of infection. The lowest concentration of SARS-CoV-2 viral copies this assay can detect is 138 copies/mL. A negative result does not preclude SARS-Cov-2 infection and should not be used as the sole basis for treatment or other patient management decisions. A negative result may occur with   improper specimen collection/handling, submission of specimen other than nasopharyngeal swab, presence of viral mutation(s) within the areas targeted by this assay, and inadequate number of viral copies(<138 copies/mL). A negative result must be combined with clinical observations, patient history, and epidemiological information. The expected result is Negative.  Fact Sheet for Patients:  BloggerCourse.com  Fact Sheet for Healthcare Providers:  SeriousBroker.it  This test is no t yet approved or cleared by the [place] FDA and  has been authorized for detection and/or diagnosis of SARS-CoV-2 by FDA under an Emergency Use Authorization (EUA). This EUA will remain  in effect (meaning  this test can be used) for the duration of the COVID-19 declaration under Section 564(b)(1) of the Act, 21 U.S.C.section 360bbb-3(b)(1), unless the authorization is terminated  or revoked sooner.       Influenza A by PCR NEGATIVE NEGATIVE Final   Influenza B by PCR NEGATIVE NEGATIVE Final    Comment: (NOTE) The Xpert Xpress SARS-CoV-2/FLU/RSV plus assay is intended as an aid in the diagnosis of influenza from Nasopharyngeal swab specimens and should not be used as a sole basis for treatment. Nasal washings and aspirates are unacceptable for Xpert Xpress SARS-CoV-2/FLU/RSV testing.  Fact Sheet for Patients: BloggerCourse.com  Fact Sheet for Healthcare Providers: SeriousBroker.it  This test is not yet approved or cleared by the [place] FDA and has been authorized for detection and/or diagnosis of SARS-CoV-2 by FDA under an Emergency Use Authorization (EUA). This EUA will remain in effect (meaning this test can be used) for the duration of the COVID-19 declaration under Section 564(b)(1) of the Act, 21 U.S.C. section 360bbb-3(b)(1), unless the authorization is terminated or revoked.      Resp Syncytial Virus by PCR POSITIVE (A) NEGATIVE Final    Comment: (NOTE) Fact Sheet for Patients: BloggerCourse.com  Fact Sheet for Healthcare Providers: SeriousBroker.it  This test is not yet approved or cleared by the [place] FDA and has been authorized for detection and/or diagnosis of SARS-CoV-2 by FDA under an Emergency Use Authorization (EUA). This EUA will remain in effect (meaning this test can be used) for the duration of the COVID-19 declaration under Section 564(b)(1) of the Act, 21 U.S.C. section 360bbb-3(b)(1), unless the authorization is terminated or revoked.  Performed at [facility], [address] [place], [place] [address]   Blood Culture (routine x 2)     Status: None (Preliminary result)   Collection Time: 10/17/23 11:43 AM   Specimen: BLOOD  Result Value Ref Range Status   Specimen Description BLOOD RIGHT ANTECUBITAL  Final   Special Requests   Final    BOTTLES DRAWN AEROBIC AND ANAEROBIC Blood Culture results may not be optimal due to an inadequate volume of blood received in culture bottles   Culture   Final    NO GROWTH 3 DAYS Performed at [facility], [address] [place], [place] [address]    Report Status PENDING  Incomplete  Blood Culture (routine x 2)     Status: None (Preliminary result)   Collection Time: 10/17/23 11:43 AM   Specimen: BLOOD  Result Value Ref Range Status   Specimen Description BLOOD BLOOD RIGHT FOREARM  Final   Special Requests   Final    BOTTLES DRAWN AEROBIC AND ANAEROBIC Blood Culture results may not be optimal due to an inadequate volume of blood received in culture bottles   Culture   Final    NO GROWTH 3 DAYS Performed at [facility], [address], [place], [place] [address]    Report Status PENDING  Incomplete   Imaging ECHOCARDIOGRAM COMPLETE Result Date: 10/19/2023    ECHOCARDIOGRAM REPORT   Patient Name:   Christian [name]  [facility] Date of Exam: 10/18/2023 Medical Rec #:  [id number]      Height:       70.0 in Accession #:    [id number]     Weight:       249.1 lb Date of Birth:  Apr 28, 1941      BSA:          2.292 m Patient Age:    3 years  BP:           118/55 mmHg Patient Gender: M              HR:           84 bpm. Exam Location:  [facility] Procedure: 2D Echo and Intracardiac Opacification Agent (Both Spectral and Color            Flow Doppler were utilized during procedure). Indications:     Chest Pain R07.9  History:         Patient has no prior history of Echocardiogram examinations.  Sonographer:     Overton Mam RDCS, FASE Referring Phys:  [id number] Sunnie Nielsen Diagnosing Phys: Debbe Odea MD  Sonographer Comments: Suboptimal apical window and patient is obese. Image acquisition challenging due to patient body habitus and Image acquisition challenging due to respiratory motion. IMPRESSIONS  1. Left ventricular ejection fraction, by estimation, is 65 to 70%. Left ventricular ejection fraction by 2D MOD biplane is 68.4 %. The left ventricle has normal function. The left ventricle has no regional wall motion abnormalities. Left ventricular diastolic parameters were normal.  2. Right ventricular systolic function is normal. The right ventricular size is normal.  3. The mitral valve is normal in structure. Trivial mitral valve regurgitation.  4. The aortic valve is calcified. Aortic valve regurgitation is not visualized. Aortic valve sclerosis/calcification is present, without any evidence of aortic stenosis.  5. The inferior vena cava is normal in size with greater than 50% respiratory variability, suggesting right atrial pressure of 3 mmHg. FINDINGS  Left Ventricle: Left ventricular ejection fraction, by estimation, is 65 to 70%. Left ventricular ejection fraction by 2D MOD biplane is 68.4 %. The left ventricle has normal function. The left ventricle has no regional wall motion abnormalities. Definity contrast agent was  given IV to delineate the left ventricular endocardial borders. Strain imaging was not performed. The left ventricular internal cavity size was normal in size. There is no left ventricular hypertrophy. Left ventricular diastolic parameters were normal. Right Ventricle: The right ventricular size is normal. No increase in right ventricular wall thickness. Right ventricular systolic function is normal. Left Atrium: Left atrial size was normal in size. Right Atrium: Right atrial size was normal in size. Pericardium: There is no evidence of pericardial effusion. Mitral Valve: The mitral valve is normal in structure. Trivial mitral valve regurgitation. Tricuspid Valve: The tricuspid valve is normal in structure. Tricuspid valve regurgitation is not demonstrated. Aortic Valve: The aortic valve is calcified. Aortic valve regurgitation is not visualized. Aortic valve sclerosis/calcification is present, without any evidence of aortic stenosis. Aortic valve peak gradient measures 9.5 mmHg. Pulmonic Valve: The pulmonic valve was not well visualized. Pulmonic valve regurgitation is not visualized. Aorta: The aortic root is normal in size and structure. Venous: The inferior vena cava is normal in size with greater than 50% respiratory variability, suggesting right atrial pressure of 3 mmHg. IAS/Shunts: No atrial level shunt detected by color flow Doppler. Additional Comments: 3D imaging was not performed.  LEFT VENTRICLE PLAX 2D                        Biplane EF (MOD) LVIDd:         4.40 cm         LV Biplane EF:   Left LVIDs:         3.10 cm  ventricular LV PW:         1.20 cm                          ejection LV IVS:        1.00 cm                          fraction by LVOT diam:     2.00 cm                          2D MOD LV SV:         71                               biplane is LV SV Index:   31                               68.4 %. LVOT Area:     3.14 cm                                Diastology                                 LV e' medial:    6.09 cm/s LV Volumes (MOD)               LV E/e' medial:  10.2 LV vol d, MOD    67.4 ml       LV e' lateral:   9.68 cm/s A2C:                           LV E/e' lateral: 6.4 LV vol d, MOD    65.3 ml A4C: LV vol s, MOD    21.3 ml A2C: LV vol s, MOD    20.9 ml A4C: LV SV MOD A2C:   46.1 ml LV SV MOD A4C:   65.3 ml LV SV MOD BP:    46.9 ml LEFT ATRIUM           Index        RIGHT ATRIUM           Index LA diam:      3.50 cm 1.53 cm/m   RA Area:     13.20 cm LA Vol (A2C): 18.4 ml 8.03 ml/m   RA Volume:   32.80 ml  14.31 ml/m LA Vol (A4C): 53.8 ml 23.48 ml/m  AORTIC VALVE                 PULMONIC VALVE AV Area (Vmax): 2.33 cm     PV Vmax:        1.15 m/s AV Vmax:        154.00 cm/s  PV Peak grad:   5.3 mmHg AV Peak Grad:   9.5 mmHg     RVOT Peak grad: 6 mmHg LVOT Vmax:      114.00 cm/s LVOT Vmean:     74.300 cm/s LVOT VTI:       0.225 m  AORTA Ao Root diam: 3.50 cm Ao Asc diam:  2.80 cm MITRAL VALVE  MV Area (PHT): 2.57 cm    SHUNTS MV Decel Time: 295 msec    Systemic VTI:  0.22 m MV E velocity: 62.20 cm/s  Systemic Diam: 2.00 cm MV A velocity: 72.70 cm/s MV E/A ratio:  0.86 Debbe Odea MD Electronically signed by Debbe Odea MD Signature Date/Time: 10/19/2023/2:45:03 PM    Final       Time coordinating discharge: over 30 minutes  SIGNED:  Sunnie Nielsen DO [facility]

## 2023-10-20 NOTE — Progress Notes (Signed)
 Physical Therapy Treatment Patient Details Name: Christian [name] MRN: [id number] DOB: 1940-10-09 Today's Date: 10/20/2023   History of Present Illness Pt is an 83 y.o. male presenting to hospital 10/17/23 with c/o SOB, cough, and weakness; recent hospital discharge 10/13/23 with metabolic encephalopathy in setting of RSV and UTI.  Pt admitted with high anion gap metabolic acidosis, a-fib, hypokalemia, chest pressure, hypothyroidism, and weakness.  PMH includes IDDM type 1, paroxysmal a-fib, CAD, htn, h/o bladder CA s/p metastasis to intra abdominal lymph nodes, adrenal insufficiency, CKD 3a.    PT Comments  Pt resting in bed upon PT arrival; pt agreeable to therapy; no c/o pain during session.  Pt reports his wife is now home (went to ER yesterday).  During session pt modified independent with bed mobility; SBA with transfers with RW use; SBA ambulating 240 feet with RW use; and CGA navigating 4 steps with use of railings.  Pt steady with functional mobility during sessions activities.  Pt's SpO2 sats 92% or greater on room air pre/post activity.  Will continue to focus on strengthening, balance, and progressive functional mobility during hospitalization.    If plan is discharge home, recommend the following: A little help with walking and/or transfers;A little help with bathing/dressing/bathroom;Assistance with cooking/housework;Assist for transportation;Help with stairs or ramp for entrance   Can travel by private vehicle      Yes  Equipment Recommendations  Rolling walker (2 wheels)    Recommendations for Other Services       Precautions / Restrictions Precautions Precautions: Fall Recall of Precautions/Restrictions: Intact Precaution/Restrictions Comments: RLQ urostomy Restrictions Weight Bearing Restrictions Per Provider Order: No     Mobility  Bed Mobility Overal bed mobility: Needs Assistance Bed Mobility: Supine to Sit, Sit to Supine     Supine to sit: Modified independent  (Device/Increase time), HOB elevated Sit to supine: Modified independent (Device/Increase time), HOB elevated   General bed mobility comments: no difficulties noted    Transfers Overall transfer level: Needs assistance Equipment used: Rolling walker (2 wheels) Transfers: Sit to/from Stand Sit to Stand: Supervision           General transfer comment: x2 trials standing from bed; vc's for scooting forward prior to standing and for UE placement    Ambulation/Gait Ambulation/Gait assistance: Supervision Gait Distance (Feet): 240 Feet Assistive device: Rolling walker (2 wheels) Gait Pattern/deviations: Step-through pattern, Decreased step length - right, Decreased step length - left Gait velocity: decreased     General Gait Details: steady ambulation with RW use   Stairs Stairs: Yes Stairs assistance: Contact guard assist Stair Management: Two rails, Step to pattern, Forwards Number of Stairs: 4 General stair comments: steady stairs navigation   Wheelchair Mobility     Tilt Bed    Modified Rankin (Stroke Patients Only)       Balance Overall balance assessment: Needs assistance Sitting-balance support: No upper extremity supported, Feet supported Sitting balance-Leahy Scale: Normal Sitting balance - Comments: steady reaching outside BOS   Standing balance support: Single extremity supported Standing balance-Leahy Scale: Good Standing balance comment: steady reaching within BOS with at least single UE support                            Communication Communication Communication: No apparent difficulties  Cognition Arousal: Alert Behavior During Therapy: WFL for tasks assessed/performed   PT - Cognitive impairments: No apparent impairments  PT - Cognition Comments: A&Ox4 Following commands: Intact      Cueing Cueing Techniques: Verbal cues  Exercises      General Comments General comments (skin integrity,  edema, etc.): RLQ urostomy in place.  Nursing cleared pt for participation in physical therapy.  Pt agreeable to PT session.      Pertinent Vitals/Pain Pain Assessment Pain Assessment: No/denies pain HR 104 bpm pre ambulation and 107 bpm post ambulation    Home Living                          Prior Function            PT Goals (current goals can now be found in the care plan section) Acute Rehab PT Goals Patient Stated Goal: to improve strength and go home PT Goal Formulation: With patient Time For Goal Achievement: 11/01/23 Potential to Achieve Goals: Good Progress towards PT goals: Progressing toward goals    Frequency    Min 1X/week      PT Plan      Co-evaluation              AM-PAC PT "6 Clicks" Mobility   Outcome Measure  Help needed turning from your back to your side while in a flat bed without using bedrails?: None Help needed moving from lying on your back to sitting on the side of a flat bed without using bedrails?: None Help needed moving to and from a bed to a chair (including a wheelchair)?: A Little Help needed standing up from a chair using your arms (e.g., wheelchair or bedside chair)?: A Little Help needed to walk in hospital room?: A Little Help needed climbing 3-5 steps with a railing? : A Little 6 Click Score: 20    End of Session Equipment Utilized During Treatment: Gait belt (up high away from urostomy site) Activity Tolerance: Patient tolerated treatment well Patient left: in bed;with call bell/phone within reach;with bed alarm set Nurse Communication: Mobility status PT Visit Diagnosis: Other abnormalities of gait and mobility (R26.89);Muscle weakness (generalized) (M62.81)     Time: 5956-3875 PT Time Calculation (min) (ACUTE ONLY): 17 min  Charges:    $Gait Training: 8-22 mins PT General Charges $$ ACUTE PT VISIT: 1 Visit                     Hendricks Limes, PT 10/20/23, 10:23 AM

## 2023-10-20 NOTE — TOC Initial Note (Signed)
 Transition of Care [facility]) - Initial/Assessment Note    Patient Details  Name: Christian [name] MRN: [id number] Date of Birth: Jun 15, 1941  [facility] of [facility]) CM/SW Contact:    Marlowe Sax, RN Phone Number: 10/20/2023, 10:32 AM  Clinical Narrative:                 Met with the patient in the room, he lives at home alone, he drives or has friends take him where he needs to go, he has a RW, 3 in1 and a WC at home and does not need additional  He does not have a preference for [facility] agencies but is agreeable to [facility], [facility] has accepted him as a patient,  Expected Discharge Plan: Home w Home Health Services Barriers to Discharge: Barriers Resolved   Patient Goals and CMS Choice Patient states their goals for this hospitalization and ongoing recovery are:: get well          Expected Discharge Plan and Services   Discharge Planning Services: CM Consult   Living arrangements for the past 2 months: Single Family Home                 DME Arranged: N/A DME Agency: NA       HH Arranged: PT, OT HH Agency: [facility] Date HH Agency Contacted: 10/20/23 Time HH Agency Contacted: [phone]    Prior Living Arrangements/Services Living arrangements for the past 2 months: Single Family Home Lives with:: Self Patient language and need for interpreter reviewed:: Yes Do you feel safe going back to the place where you live?: Yes      Need for Family Participation in Patient Care: Yes (Comment) Care giver support system in place?: Yes (comment) Current home services: DME (Rw, 3 in1, WC) Criminal Activity/Legal Involvement Pertinent to Current Situation/Hospitalization: No - Comment as needed  Activities of Daily Living   ADL Screening (condition at time of admission) Independently performs ADLs?: No Does the patient have a NEW difficulty with bathing/dressing/toileting/self-feeding that is expected to last >3 days?: Yes (Initiates electronic notice to provider for  possible OT consult) Does the patient have a NEW difficulty with getting in/out of bed, walking, or climbing stairs that is expected to last >3 days?: Yes (Initiates electronic notice to provider for possible PT consult) Does the patient have a NEW difficulty with communication that is expected to last >3 days?: No Is the patient deaf or have difficulty hearing?: No Does the patient have difficulty seeing, even when wearing glasses/contacts?: No Does the patient have difficulty concentrating, remembering, or making decisions?: No  Permission Sought/Granted   Permission granted to share information with : Yes, Verbal Permission Granted              Emotional Assessment Appearance:: Appears stated age Attitude/Demeanor/Rapport: Engaged Affect (typically observed): Accepting Orientation: : Oriented to Self, Oriented to Place, Oriented to  Time, Oriented to Situation Alcohol / Substance Use: Not Applicable Psych Involvement: No (comment)  Admission diagnosis:  Weakness [R53.1] Hypoxia [R09.02] Severe sepsis with acute organ dysfunction (HCC) [A41.9, R65.20] Urinary tract infection without hematuria, site unspecified [N39.0] Diabetic ketoacidosis without coma associated with other specified diabetes mellitus (HCC) [E13.10] Sepsis, due to unspecified organism, unspecified whether acute organ dysfunction present (HCC) [A41.9] Cough, unspecified type [R05.9] Patient Active Problem List   Diagnosis Date Noted   Severe sepsis with acute organ dysfunction (HCC) 10/17/2023   High anion gap metabolic acidosis 10/17/2023   Weakness 10/17/2023   Hypothyroidism 10/17/2023  Chest pressure 10/17/2023   Hypokalemia 10/17/2023   Uncontrolled type 1 diabetes mellitus with hyperglycemia, with long-term current use of insulin (HCC) 10/11/2023   Acute metabolic encephalopathy 10/11/2023   Respiratory syncytial virus (RSV) infection 10/11/2023   Hyperlipidemia    Adrenal insufficiency due to cancer  therapy (HCC) 01/28/2023   Long term (current) use of anticoagulants 01/25/2023   A-fib (HCC) 08/26/2022   Acute renal failure superimposed on stage 3a chronic kidney disease (HCC) 06/04/2021   Hyponatremia 06/04/2021   Recurrent UTI 06/04/2021   Seasonal allergies 06/04/2021   Anemia due to antineoplastic chemotherapy 03/13/2021   Fever 01/16/2021   Hypomagnesemia 01/03/2021   Bladder cancer metastasized to intra-abdominal lymph nodes (HCC) 11/14/2020   Goals of care, counseling/discussion 09/29/2020   Leukocytosis 09/29/2020   Retroperitoneal lymphadenopathy 09/29/2020   Loss of weight 09/29/2020   Adrenal mass (HCC) 09/29/2020   Essential hypertension 02/13/2018   Sepsis (HCC) 11/17/2017   Chronic deep vein thrombosis (DVT) of lower extremity (HCC) 06/05/2017   History of DVT (deep vein thrombosis) 06/05/2017   Aortic atherosclerosis (HCC) 12/04/2016   Other abnormal glucose 11/04/2013   History of bladder cancer 11/04/2013   Encysted hydrocele 01/20/2013   Kidney stone 01/20/2013   Pure hypercholesterolemia 07/16/2010   OBESITY-MORBID (>100') 07/16/2010   Coronary artery disease 07/16/2010   Abdominal aortic aneurysm (HCC) 07/16/2010   PCP:  Ethelda Chick, MD Pharmacy:   [phone] [place] - [address] [place] [address] Phone: [phone] Fax: [phone]     Social Drivers of Health (SDOH) Social History: SDOH Screenings   Food Insecurity: No Food Insecurity (10/17/2023)  Housing: Low Risk  (10/17/2023)  Transportation Needs: No Transportation Needs (10/17/2023)  Utilities: Not At Risk (10/17/2023)  Depression (PHQ2-9): Low Risk  (10/11/2022)  Financial Resource Strain: Low Risk  (01/06/2023)   [facility] from [facility], [facility]  Physical Activity: Insufficiently Active (01/13/2020)   Received from [facility], [facility]  Social Connections: Socially  Integrated (10/17/2023)  Stress: No Stress Concern Present (01/13/2020)   Received from [facility], [facility]  Tobacco Use: Low Risk  (10/17/2023)   SDOH Interventions:     Readmission Risk Interventions     No data to display

## 2023-10-20 NOTE — Care Management Important Message (Signed)
 Important Message  Patient Details  Name: Christian [name] MRN: [id number] Date of Birth: 07-23-1941   Important Message Given:  Yes - Medicare IM     Cristela Blue, CMA 10/20/2023, 11:34 AM

## 2023-10-22 LAB — CULTURE, BLOOD (ROUTINE X 2)
Culture: NO GROWTH
Culture: NO GROWTH

## 2023-11-07 ENCOUNTER — Other Ambulatory Visit: Payer: Self-pay | Admitting: Cardiovascular Disease

## 2023-11-18 ENCOUNTER — Other Ambulatory Visit: Payer: Self-pay

## 2023-11-18 ENCOUNTER — Inpatient Hospital Stay
Admission: EM | Admit: 2023-11-18 | Discharge: 2023-11-21 | DRG: 690 | Disposition: A | Discharge status: Home Health | Attending: Internal Medicine | Admitting: Internal Medicine

## 2023-11-18 DIAGNOSIS — C772 Secondary and unspecified malignant neoplasm of intra-abdominal lymph nodes: Secondary | ICD-10-CM | POA: Diagnosis present

## 2023-11-18 DIAGNOSIS — Z906 Acquired absence of other parts of urinary tract: Secondary | ICD-10-CM

## 2023-11-18 DIAGNOSIS — I252 Old myocardial infarction: Secondary | ICD-10-CM

## 2023-11-18 DIAGNOSIS — N179 Acute kidney failure, unspecified: Secondary | ICD-10-CM | POA: Diagnosis present

## 2023-11-18 DIAGNOSIS — Z79890 Hormone replacement therapy (postmenopausal): Secondary | ICD-10-CM

## 2023-11-18 DIAGNOSIS — I10 Essential (primary) hypertension: Secondary | ICD-10-CM | POA: Diagnosis present

## 2023-11-18 DIAGNOSIS — N390 Urinary tract infection, site not specified: Secondary | ICD-10-CM | POA: Diagnosis not present

## 2023-11-18 DIAGNOSIS — I129 Hypertensive chronic kidney disease with stage 1 through stage 4 chronic kidney disease, or unspecified chronic kidney disease: Secondary | ICD-10-CM | POA: Diagnosis present

## 2023-11-18 DIAGNOSIS — N189 Chronic kidney disease, unspecified: Secondary | ICD-10-CM | POA: Diagnosis present

## 2023-11-18 DIAGNOSIS — Z87442 Personal history of urinary calculi: Secondary | ICD-10-CM

## 2023-11-18 DIAGNOSIS — N1831 Chronic kidney disease, stage 3a: Secondary | ICD-10-CM | POA: Diagnosis present

## 2023-11-18 DIAGNOSIS — T451X5A Adverse effect of antineoplastic and immunosuppressive drugs, initial encounter: Secondary | ICD-10-CM | POA: Diagnosis present

## 2023-11-18 DIAGNOSIS — I480 Paroxysmal atrial fibrillation: Secondary | ICD-10-CM | POA: Diagnosis present

## 2023-11-18 DIAGNOSIS — E1122 Type 2 diabetes mellitus with diabetic chronic kidney disease: Secondary | ICD-10-CM | POA: Diagnosis present

## 2023-11-18 DIAGNOSIS — R110 Nausea: Principal | ICD-10-CM

## 2023-11-18 DIAGNOSIS — Z888 Allergy status to other drugs, medicaments and biological substances status: Secondary | ICD-10-CM

## 2023-11-18 DIAGNOSIS — Z882 Allergy status to sulfonamides status: Secondary | ICD-10-CM

## 2023-11-18 DIAGNOSIS — Z79899 Other long term (current) drug therapy: Secondary | ICD-10-CM

## 2023-11-18 DIAGNOSIS — E871 Hypo-osmolality and hyponatremia: Secondary | ICD-10-CM | POA: Diagnosis present

## 2023-11-18 DIAGNOSIS — E1129 Type 2 diabetes mellitus with other diabetic kidney complication: Secondary | ICD-10-CM | POA: Diagnosis present

## 2023-11-18 DIAGNOSIS — E785 Hyperlipidemia, unspecified: Secondary | ICD-10-CM | POA: Diagnosis present

## 2023-11-18 DIAGNOSIS — B9620 Unspecified Escherichia coli [E. coli] as the cause of diseases classified elsewhere: Secondary | ICD-10-CM | POA: Diagnosis present

## 2023-11-18 DIAGNOSIS — Z7901 Long term (current) use of anticoagulants: Secondary | ICD-10-CM

## 2023-11-18 DIAGNOSIS — Z881 Allergy status to other antibiotic agents status: Secondary | ICD-10-CM

## 2023-11-18 DIAGNOSIS — G8929 Other chronic pain: Secondary | ICD-10-CM | POA: Diagnosis present

## 2023-11-18 DIAGNOSIS — E1121 Type 2 diabetes mellitus with diabetic nephropathy: Secondary | ICD-10-CM

## 2023-11-18 DIAGNOSIS — Z794 Long term (current) use of insulin: Secondary | ICD-10-CM

## 2023-11-18 DIAGNOSIS — Z8551 Personal history of malignant neoplasm of bladder: Secondary | ICD-10-CM

## 2023-11-18 DIAGNOSIS — Z936 Other artificial openings of urinary tract status: Secondary | ICD-10-CM

## 2023-11-18 DIAGNOSIS — I719 Aortic aneurysm of unspecified site, without rupture: Secondary | ICD-10-CM | POA: Diagnosis present

## 2023-11-18 DIAGNOSIS — E273 Drug-induced adrenocortical insufficiency: Secondary | ICD-10-CM | POA: Diagnosis present

## 2023-11-18 DIAGNOSIS — G4730 Sleep apnea, unspecified: Secondary | ICD-10-CM | POA: Diagnosis present

## 2023-11-18 DIAGNOSIS — Z833 Family history of diabetes mellitus: Secondary | ICD-10-CM

## 2023-11-18 DIAGNOSIS — Z87440 Personal history of urinary (tract) infections: Secondary | ICD-10-CM

## 2023-11-18 DIAGNOSIS — Z9861 Coronary angioplasty status: Secondary | ICD-10-CM

## 2023-11-18 DIAGNOSIS — I4891 Unspecified atrial fibrillation: Secondary | ICD-10-CM | POA: Diagnosis present

## 2023-11-18 DIAGNOSIS — Z1611 Resistance to penicillins: Secondary | ICD-10-CM | POA: Diagnosis present

## 2023-11-18 DIAGNOSIS — E039 Hypothyroidism, unspecified: Secondary | ICD-10-CM | POA: Diagnosis present

## 2023-11-18 DIAGNOSIS — R112 Nausea with vomiting, unspecified: Secondary | ICD-10-CM | POA: Diagnosis not present

## 2023-11-18 DIAGNOSIS — Z86718 Personal history of other venous thrombosis and embolism: Secondary | ICD-10-CM

## 2023-11-18 DIAGNOSIS — I2510 Atherosclerotic heart disease of native coronary artery without angina pectoris: Secondary | ICD-10-CM | POA: Diagnosis present

## 2023-11-18 DIAGNOSIS — I82592 Chronic embolism and thrombosis of other specified deep vein of left lower extremity: Secondary | ICD-10-CM

## 2023-11-18 DIAGNOSIS — I82509 Chronic embolism and thrombosis of unspecified deep veins of unspecified lower extremity: Secondary | ICD-10-CM | POA: Diagnosis present

## 2023-11-18 LAB — URINALYSIS, W/ REFLEX TO CULTURE (INFECTION SUSPECTED)
Bilirubin Urine: NEGATIVE
Glucose, UA: 150 mg/dL — AB
Ketones, ur: 5 mg/dL — AB
Leukocytes,Ua: NEGATIVE
Nitrite: POSITIVE — AB
Protein, ur: 30 mg/dL — AB
Specific Gravity, Urine: 1.015 (ref 1.005–1.030)
Squamous Epithelial / HPF: 0 /HPF (ref 0–5)
pH: 5 (ref 5.0–8.0)

## 2023-11-18 LAB — GLUCOSE, CAPILLARY
Glucose-Capillary: 298 mg/dL — ABNORMAL HIGH (ref 70–99)
Glucose-Capillary: 305 mg/dL — ABNORMAL HIGH (ref 70–99)

## 2023-11-18 LAB — COMPREHENSIVE METABOLIC PANEL
ALT: 12 U/L (ref 0–44)
AST: 18 U/L (ref 15–41)
Albumin: 3.9 g/dL (ref 3.5–5.0)
Alkaline Phosphatase: 64 U/L (ref 38–126)
Anion gap: 10 (ref 5–15)
BUN: 21 mg/dL (ref 8–23)
CO2: 24 mmol/L (ref 22–32)
Calcium: 9.1 mg/dL (ref 8.9–10.3)
Chloride: 100 mmol/L (ref 98–111)
Creatinine, Ser: 1.75 mg/dL — ABNORMAL HIGH (ref 0.61–1.24)
GFR, Estimated: 39 mL/min — ABNORMAL LOW (ref >60)
Glucose, Bld: 255 mg/dL — ABNORMAL HIGH (ref 70–99)
Potassium: 3.5 mmol/L (ref 3.5–5.1)
Sodium: 134 mmol/L — ABNORMAL LOW (ref 135–145)
Total Bilirubin: 1.3 mg/dL — ABNORMAL HIGH (ref 0.0–1.2)
Total Protein: 7.8 g/dL (ref 6.5–8.1)

## 2023-11-18 LAB — CBC
HCT: 39.6 % (ref 39.0–52.0)
Hemoglobin: 13.4 g/dL (ref 13.0–17.0)
MCH: 33.8 pg (ref 26.0–34.0)
MCHC: 33.8 g/dL (ref 30.0–36.0)
MCV: 100 fL (ref 80.0–100.0)
Platelets: 223 10*3/uL (ref 150–400)
RBC: 3.96 MIL/uL — ABNORMAL LOW (ref 4.22–5.81)
RDW: 13.6 % (ref 11.5–15.5)
WBC: 9.5 10*3/uL (ref 4.0–10.5)
nRBC: 0 % (ref 0.0–0.2)

## 2023-11-18 LAB — TROPONIN I (HIGH SENSITIVITY): Troponin I (High Sensitivity): 9 ng/L (ref <18)

## 2023-11-18 LAB — LACTIC ACID, PLASMA: Lactic Acid, Venous: 1.7 mmol/L (ref 0.5–1.9)

## 2023-11-18 LAB — LIPASE, BLOOD: Lipase: 25 U/L (ref 11–51)

## 2023-11-18 MED ORDER — ONDANSETRON HCL 4 MG/2ML IJ SOLN: 4.0000 mg | Freq: Three times a day (TID) | INTRAMUSCULAR | Status: DC | PRN

## 2023-11-18 MED ORDER — CEFEPIME HCL 2 G IV SOLR
2.0000 g | Freq: Two times a day (BID) | INTRAVENOUS | Status: DC
  Administered 2023-11-19 – 2023-11-20 (×3): 2 g via INTRAVENOUS
  Filled 2023-11-18 (×3): qty 12.5

## 2023-11-18 MED ORDER — ACETAMINOPHEN 325 MG PO TABS: 650.0000 mg | ORAL_TABLET | Freq: Four times a day (QID) | ORAL | Status: DC | PRN

## 2023-11-18 MED ORDER — INSULIN ASPART 100 UNIT/ML IJ SOLN
0.0000 [IU] | Freq: Every day | INTRAMUSCULAR | Status: DC
  Administered 2023-11-18: 3 [IU] via SUBCUTANEOUS
  Administered 2023-11-20: 2 [IU] via SUBCUTANEOUS
  Filled 2023-11-18 (×2): qty 1

## 2023-11-18 MED ORDER — SODIUM CHLORIDE 0.9 % IV SOLN
2.0000 g | Freq: Once | INTRAVENOUS | Status: AC
  Administered 2023-11-18: 2 g via INTRAVENOUS
  Filled 2023-11-18: qty 12.5

## 2023-11-18 MED ORDER — ONDANSETRON HCL 4 MG/2ML IJ SOLN
4.0000 mg | Freq: Once | INTRAMUSCULAR | Status: AC
  Administered 2023-11-18: 4 mg via INTRAVENOUS
  Filled 2023-11-18: qty 2

## 2023-11-18 MED ORDER — ENOXAPARIN SODIUM 60 MG/0.6ML IJ SOSY: 0.5000 mg/kg | PREFILLED_SYRINGE | INTRAMUSCULAR | Status: DC

## 2023-11-18 MED ORDER — LACTATED RINGERS IV BOLUS: 500.0000 mL | Freq: Once | INTRAVENOUS | Status: DC

## 2023-11-18 MED ORDER — SODIUM CHLORIDE 0.9 % IV BOLUS
1000.0000 mL | Freq: Once | INTRAVENOUS | Status: AC
  Administered 2023-11-18: 1000 mL via INTRAVENOUS

## 2023-11-18 MED ORDER — APIXABAN 5 MG PO TABS
5.0000 mg | ORAL_TABLET | Freq: Two times a day (BID) | ORAL | Status: DC
  Administered 2023-11-18 – 2023-11-21 (×6): 5 mg via ORAL
  Filled 2023-11-18 (×6): qty 1

## 2023-11-18 MED ORDER — INSULIN ASPART 100 UNIT/ML IJ SOLN
0.0000 [IU] | Freq: Three times a day (TID) | INTRAMUSCULAR | Status: DC
  Administered 2023-11-19: 7 [IU] via SUBCUTANEOUS
  Administered 2023-11-19 – 2023-11-20 (×3): 9 [IU] via SUBCUTANEOUS
  Filled 2023-11-18 (×4): qty 1

## 2023-11-18 MED ORDER — LACTATED RINGERS IV SOLN: INTRAVENOUS | Status: DC

## 2023-11-18 MED ORDER — HYDROCORTISONE SOD SUC (PF) 100 MG IJ SOLR
100.0000 mg | Freq: Three times a day (TID) | INTRAMUSCULAR | Status: DC
  Administered 2023-11-18 – 2023-11-19 (×3): 100 mg via INTRAVENOUS
  Filled 2023-11-18 (×6): qty 2

## 2023-11-18 NOTE — Sepsis Progress Note (Signed)
 Elink monitoring for the code sepsis protocol.

## 2023-11-18 NOTE — Consult Note (Signed)
 CODE SEPSIS - PHARMACY COMMUNICATION  **Broad Spectrum Antibiotics should be administered within 1 hour of Sepsis diagnosis**  Time Code Sepsis Called/Page Received: 3/25 @ 1801  Antibiotics Ordered:  Cefepime 2g x 1  Time of 1st antibiotic administration: 3/25 @ 1821  Additional action taken by pharmacy: NA  If necessary, Name of Provider/Nurse Contacted: NA  Effie Shy, PharmD Pharmacy Resident  11/18/2023 6:35 PM

## 2023-11-18 NOTE — Consult Note (Signed)
 Pharmacy Antibiotic Note  Christian [name] is a 83 y.o. male admitted on 11/18/2023 with  complicated UTI .  Pharmacy has been consulted for Cefepime dosing.  Today, 11/18/2023 Scr 1.75 (BL 1.4) WBC 9.5 Afebrile Imaging: NA Patient already received 1 dose of Cefepime 2g IV @ 1830  Plan: Cefepime 2g IV Q12H Continue to monitor renal function and follow cultures.   Height: 5\' 10"  (177.8 cm) Weight: 108.9 kg (240 lb) IBW/kg (Calculated) : 73  Temp (24hrs), Avg:97.8 F (36.6 C), Min:97.6 F (36.4 C), Max:98 F (36.7 C)  Recent Labs  Lab 11/18/23 1128 11/18/23 1716  WBC 9.5  --   CREATININE 1.75*  --   LATICACIDVEN  --  1.7    Estimated Creatinine Clearance: 41.6 mL/min (A) (by C-G formula based on SCr of 1.75 mg/dL (H)).    Allergies  Allergen Reactions   Ceftriaxone Hives    Tolerated cefazolin 09/2021 without reaction Received piperacillin/tazobactam in 2019 (after reported ceftriaxone allergy which was in 2018)   Other Hives   Sulfasalazine Hives   Sulfa Antibiotics Hives   Antimicrobials this admission: 3/25 Cefepime >>   Dose adjustments this admission: NA  Microbiology results: 3/25 BCx: IP 3/25 UCx: IP   Thank you for allowing pharmacy to be a part of this patient's care.  Effie Shy, PharmD Pharmacy Resident  11/18/2023 7:10 PM

## 2023-11-18 NOTE — Progress Notes (Signed)
 Anticoagulation monitoring(Lovenox):  82yo  F ordered Lovenox 40 mg Q24h    Filed Weights   11/18/23 1125  Weight: 108.9 kg (240 lb)   BMI 34   Lab Results  Component Value Date   CREATININE 1.75 (H) 11/18/2023   CREATININE 1.38 (H) 10/19/2023   CREATININE 1.31 (H) 10/18/2023   Estimated Creatinine Clearance: 41.6 mL/min (A) (by C-G formula based on SCr of 1.75 mg/dL (H)). Hemoglobin & Hematocrit     Component Value Date/Time   HGB 13.4 11/18/2023 1128   HGB 13.5 02/23/2018 1101   HCT 39.6 11/18/2023 1128   HCT 40.2 02/23/2018 1101     Per Protocol for Patient with estCrcl > 30 ml/min and BMI > 30, will transition to Lovenox 0.5 mg/kg Q24h       Bari Mantis PharmD Clinical Pharmacist 11/18/2023

## 2023-11-18 NOTE — ED Notes (Signed)
Informed RN of bed assignment ?

## 2023-11-18 NOTE — ED Provider Notes (Addendum)
 [facility] Provider Note    Event Date/Time   First MD Initiated Contact with Patient 11/18/23 1559     (approximate)   History   Nausea   HPI  Christian [name] is a 83 y.o. male past medical history significant for insulin-dependent diabetes, paroxysmal atrial fibrillation, history of DVT on Eliquis, history of bladder cancer CAD, hypertension, history of adrenal insufficiency, presents to the emergency department with nausea and vomiting.  States that last night he started to not feel well.  Complaining of nausea, vomiting.  States that it feels very similar from her prior history of urosepsis.  States that he has had frequent urinary tract infections.  Does endorse urinary urgency and burning with urination.  Ongoing nausea and vomiting.  Denies any pain at this time.  Does state that he has a history of kidney stones but is not having any pain, denies any history of an infected kidney stone.  Discussed generalized weakness, dizziness and fatigue over the past 24 hours.  Patient is on home hydrocortisone therapy     Physical Exam   Triage Vital Signs: ED Triage Vitals  Encounter Vitals Group     BP 11/18/23 1127 90/60     Systolic BP Percentile --      Diastolic BP Percentile --      Pulse Rate 11/18/23 1127 (!) 105     Resp 11/18/23 1127 18     Temp 11/18/23 1127 98 F (36.7 C)     Temp Source 11/18/23 1504 Oral     SpO2 11/18/23 1127 92 %     Weight 11/18/23 1125 240 lb (108.9 kg)     Height 11/18/23 1125 5\' 10"  (1.778 m)     Head Circumference --      Peak Flow --      Pain Score 11/18/23 1125 0     Pain Loc --      Pain Education --      Exclude from Growth Chart --     Most recent vital signs: Vitals:   11/18/23 1718 11/18/23 1730  BP: 115/63 (!) 118/58  Pulse: 91 84  Resp: 17   Temp:    SpO2: 97% 97%    Physical Exam Constitutional:      Appearance: He is well-developed.  HENT:     Head: Atraumatic.  Eyes:      Conjunctiva/sclera: Conjunctivae normal.  Cardiovascular:     Rate and Rhythm: Regular rhythm. Tachycardia present.  Pulmonary:     Effort: No respiratory distress.  Abdominal:     Tenderness: There is no abdominal tenderness. There is no right CVA tenderness or left CVA tenderness.     Comments: Urostomy bag with pink-tinged urine  Musculoskeletal:        General: Normal range of motion.     Cervical back: Normal range of motion.  Skin:    General: Skin is warm.     Capillary Refill: Capillary refill takes less than 2 seconds.  Neurological:     Mental Status: He is alert. Mental status is at baseline.     IMPRESSION / MDM / ASSESSMENT AND PLAN / ED COURSE  I reviewed the triage vital signs and the nursing notes.  On arrival patient afebrile, tachycardic, initial blood pressure 90/60, repeat blood pressure in the room 118/58.  Blood cultures obtained given his history of urosepsis.  Patient had a recent hospitalization approximately 1 month ago -had sepsis likely secondary to urinary tract  infection and RSV.  Patient was on aztreonam and vancomycin and ultimately transition to meropenem and vancomycin.  Switched to ciprofloxacin at time of discharge.   No tachycardic or bradycardic dysrhythmias while on cardiac telemetry.  LABS (all labs ordered are listed, but only abnormal results are displayed) Labs interpreted as -    Labs Reviewed  COMPREHENSIVE METABOLIC PANEL - Abnormal; Notable for the following components:      Result Value   Sodium 134 (*)    Glucose, Bld 255 (*)    Creatinine, Ser 1.75 (*)    Total Bilirubin 1.3 (*)    GFR, Estimated 39 (*)    All other components within normal limits  CBC - Abnormal; Notable for the following components:   RBC 3.96 (*)    All other components within normal limits  URINALYSIS, W/ REFLEX TO CULTURE (INFECTION SUSPECTED) - Abnormal; Notable for the following components:   Color, Urine YELLOW (*)    APPearance HAZY (*)     Glucose, UA 150 (*)    Hgb urine dipstick LARGE (*)    Ketones, ur 5 (*)    Protein, ur 30 (*)    Nitrite POSITIVE (*)    Bacteria, UA MANY (*)    Non Squamous Epithelial PRESENT (*)    All other components within normal limits  CULTURE, BLOOD (SINGLE)  URINE CULTURE  CULTURE, BLOOD (SINGLE)  LIPASE, BLOOD  LACTIC ACID, PLASMA  TROPONIN I (HIGH SENSITIVITY)     MDM  Patient presented to the emergency department with nausea, vomiting and concern for urinary tract infection.  On chart review frequent urinary tract infections and 2 hospitalizations for urosepsis.  Prior urine culture was from 2019, resistant to fluoroquinolones, was sensitive to cephalosporins however there is a note of ceftriaxone allergy.  Discussed the patient's case with pharmacy on-call who saw that the patient has received IV cefepime recently.  Recommended starting on IV cefepime.  No significant leukocytosis.  Hemoglobin is stable.  Mild acute kidney injury with creatinine elevated up to 1.75 from a baseline of 1.3.  Normal BUN.  Mild hyponatremia.  Normal lipase.  Urine with concern for urinary tract infection with positive nitrites and many bacteria.  Patient with ongoing nausea and vomiting, given another dose of IV Zofran.  Consulted hospitalist for admission for urinary tract infection     PROCEDURES:  Critical Care performed: yes  .Critical Care  Performed by: Corena Herter, MD Authorized by: Corena Herter, MD   Critical care provider statement:    Critical care time (minutes):  30   Critical care time was exclusive of:  Separately billable procedures and treating other patients   Critical care was necessary to treat or prevent imminent or life-threatening deterioration of the following conditions: complicated UTI, dehydration.   Critical care was time spent personally by me on the following activities:  Development of treatment plan with patient or surrogate, discussions with consultants,  evaluation of patient's response to treatment, examination of patient, ordering and review of laboratory studies, ordering and review of radiographic studies, ordering and performing treatments and interventions, pulse oximetry, re-evaluation of patient's condition and review of old charts   Care discussed with: admitting provider     Patient's presentation is most consistent with acute presentation with potential threat to life or bodily function.   MEDICATIONS ORDERED IN ED: Medications  lactated ringers infusion (has no administration in time range)  ceFEPIme (MAXIPIME) 2 g in sodium chloride 0.9 % 100 mL IVPB (2 g  Intravenous New Bag/Given 11/18/23 1821)  hydrocortisone sodium succinate (SOLU-CORTEF) 100 MG injection 100 mg (has no administration in time range)  sodium chloride 0.9 % bolus 1,000 mL (0 mLs Intravenous Stopped 11/18/23 1819)  ondansetron (ZOFRAN) injection 4 mg (4 mg Intravenous Given 11/18/23 1824)    FINAL CLINICAL IMPRESSION(S) / ED DIAGNOSES   Final diagnoses:  Nausea without vomiting  Complicated UTI (urinary tract infection)     Rx / DC Orders   ED Discharge Orders     None        Note:  This document was prepared using Dragon voice recognition software and may include unintentional dictation errors.   Corena Herter, MD 11/18/23 4098    Corena Herter, MD 11/18/23 [phone]

## 2023-11-18 NOTE — ED Triage Notes (Signed)
 Pt comes via EMS from home with c/o N/V/D since yesterday. Pt states he was released from [facility] week ago for sepsis UTI. Pt states he thinks they sent him home too soon.

## 2023-11-18 NOTE — H&P (Signed)
 History and Physical    Christian [name] ZOX:[id number] DOB: August 24, 1941 DOA: 11/18/2023  Referring MD/NP/PA:   PCP: Ethelda Chick, MD   Patient coming from:  The patient is coming from home.     Chief Complaint: Nausea, vomiting  HPI: Christian [name] is a 83 y.o. male with medical history significant of bladder cancer metastasized to intra abdominal lymph nodes, s/p for urostomy, DM, PAF on Eliquis, CAD, HTN, DM,  adrenal insufficiency due to cancer therapy, CKD 3a, hypothyroidism, who presents with nausea vomiting.  Patient states he started having nausea vomiting since yesterday.  He has nonbilious nonbloody vomiting at least 5 times.  No diarrhea or abdominal pain.  No fever or chills.  He feels like he has recurrent UTI.  No chest pain, cough, SOB.  Patient has generalized weakness.  Data reviewed independently and ED Course: pt was found to have WBC 9.5, lactic acid 1.7, positive UA (hazy appearance, negative leukocyte, positive nitrite, many bacteria, WBC 21-50), worsening renal function, temperature normal, blood pressure 96/60, 118/58, heart rate 109, RR 18, oxygen saturation 97% on room air.  Patient is admitted to telemetry bed as inpatient.   EKG: Not done in ED, will get one.     Review of Systems:   General: no fevers, chills, no body weight gain, has fatigue HEENT: no blurry vision, hearing changes or sore throat Respiratory: no dyspnea, coughing, wheezing CV: no chest pain, no palpitations GI: has nausea, vomiting, no abdominal pain, diarrhea, constipation GU: no dysuria, burning on urination, increased urinary frequency, hematuria  Ext: no leg edema Neuro: no unilateral weakness, numbness, or tingling, no vision change or hearing loss Skin: no rash, no skin tear. MSK: No muscle spasm, no deformity, no limitation of range of movement in spin Heme: No easy bruising.  Travel history: No recent long distant travel.   Allergy:  Allergies  Allergen Reactions    Ceftriaxone Hives    Tolerated cefazolin 09/2021 without reaction Received piperacillin/tazobactam in 2019 (after reported ceftriaxone allergy which was in 2018)   Other Hives   Sulfasalazine Hives   Sulfa Antibiotics Hives    Past Medical History:  Diagnosis Date   Aortic aneurysm (HCC)    followed by vascular surgery yearly.   Bladder cancer [facility])    s/p bladder resection. [place]   Coronary artery disease    AMI anterior wall 1997; s/p PTCA to LAD 11/1995 [facility].   Diabetes mellitus without complication (HCC)    DVT (deep venous thrombosis) (HCC) 10/07/2007   Glucose intolerance (impaired glucose tolerance)    Hydrocele    Hyperlipidemia    Hypertension    MI (myocardial infarction) (HCC) 08/27/1995   Nephrolithiasis    Sleep apnea     Past Surgical History:  Procedure Laterality Date   ANGIOPLASTY  1997   [place]   bladder removed     CARDIAC CATHETERIZATION     COLONOSCOPY  08/27/2011   normal.  [facility].   HYDROCELE EXCISION     PROSTATE SURGERY     prostatectomy with bladder resection.    Social History:  reports that he has never smoked. He has never used smokeless tobacco. He reports that he does not drink alcohol and does not use drugs.  Family History:  Family History  Problem Relation Age of Onset   Stroke Mother    Diabetes Mother    Lung cancer Father    Cancer Father        lung cancer  Diabetes Sister    Diabetes Brother      Prior to Admission medications   Medication Sig Start Date End Date Taking? Authorizing Provider  acetaminophen (TYLENOL) 325 MG tablet Take 1-2 tablets (325-650 mg total) by mouth every 6 (six) hours as needed for mild pain (pain score 1-3), moderate pain (pain score 4-6), fever or headache (or Fever >/= 101). 10/20/23   Sunnie Nielsen, DO  apixaban (ELIQUIS) 5 MG TABS tablet Take 1 tablet (5 mg total) by mouth 2 (two) times daily. 08/17/18   Antonieta Iba, MD  atorvastatin (LIPITOR) 10 MG tablet Take 1  tablet (10 mg total) by mouth daily at 6 PM. 02/23/18   Ethelda Chick, MD  Continuous Glucose Receiver (DEXCOM G7 RECEIVER) [facility] See admin instructions.    [provider]  Continuous Glucose Sensor (DEXCOM G7 SENSOR) MISC USE 1 EACH EVERY 10 DAYS    [provider]  fluticasone (FLONASE) 50 MCG/ACT nasal spray Place 2 sprays into both nostrils as needed. 04/19/19   [provider]  gabapentin (NEURONTIN) 300 MG capsule Take 600 mg by mouth 2 (two) times daily. 07/23/21   [provider]  hydrocortisone (CORTEF) 10 MG tablet Take 20 mg in the morning and 10 mg at night 02/20/23   [provider]  Insulin Aspart FlexPen (NOVOLOG) 100 UNIT/ML Inject 25-40 units subcutaneously before meals three times daily as directed. Max Total Daily Dose 130 units per day. 02/17/23   [provider]  Lancets (ONETOUCH DELICA PLUS [place]) MISC Apply 1 each topically 3 (three) times daily. 09/30/22   [provider]  LANTUS SOLOSTAR 100 UNIT/ML Solostar Pen Inject 48 Units into the skin at bedtime. 10/07/22   [provider]  levothyroxine (SYNTHROID) 112 MCG tablet Take 112 mcg by mouth daily before breakfast. 02/20/23 02/20/24  [provider]  lidocaine (LIDODERM) 5 % Place onto the skin as directed. 09/21/23   [provider]  nitroGLYCERIN (NITROSTAT) 0.4 MG SL tablet Place 1 tablet (0.4 mg total) under the tongue every 5 (five) minutes as needed for chest pain. 02/23/18   Ethelda Chick, MD  [facility] ULTRA test strip 1 each (1 strip total) 3 (three) times daily Use as instructed. Dx code: E11.9 09/30/22   [provider]    Physical Exam: Vitals:   11/18/23 1730 11/18/23 1800 11/18/23 1830 11/18/23 1954  BP: (!) 118/58 111/61 (!) 104/58 99/60  Pulse: 84 88 88 86  Resp:    17  Temp:      TempSrc:      SpO2: 97% 94% 91% 95%  Weight:      Height:       General: Not in acute distress HEENT:       Eyes: PERRL, EOMI,  no jaundice       ENT: No discharge from the ears and nose, no pharynx injection, no tonsillar enlargement.        Neck: No JVD, no bruit, no mass felt. Heme: No neck lymph node enlargement. Cardiac: S1/S2, RRR, No murmurs, No gallops or rubs. Respiratory: No rales, wheezing, rhonchi or rubs. GI: Soft, nondistended, nontender, no rebound pain, no organomegaly, BS present.  S/P of urostomy in left abdomen with clean surrounding GU: No hematuria Ext: No pitting leg edema bilaterally. 1+DP/PT pulse bilaterally. Musculoskeletal: No joint deformities, No joint redness or warmth, no limitation of ROM in spin. Skin: No rashes.  Neuro: Alert, oriented X3, cranial nerves II-XII grossly intact, moves all  extremities normally.  Psych: Patient is not psychotic, no suicidal or hemocidal ideation.  Labs on Admission: I have personally reviewed following labs and imaging studies  CBC: Recent Labs  Lab 11/18/23 1128  WBC 9.5  HGB 13.4  HCT 39.6  MCV 100.0  PLT 223   Basic Metabolic Panel: Recent Labs  Lab 11/18/23 1128  NA 134*  K 3.5  CL 100  CO2 24  GLUCOSE 255*  BUN 21  CREATININE 1.75*  CALCIUM 9.1   GFR: Estimated Creatinine Clearance: 41.6 mL/min (A) (by C-G formula based on SCr of 1.75 mg/dL (H)). Liver Function Tests: Recent Labs  Lab 11/18/23 1128  AST 18  ALT 12  ALKPHOS 64  BILITOT 1.3*  PROT 7.8  ALBUMIN 3.9   Recent Labs  Lab 11/18/23 1128  LIPASE 25   No results for input(s): "AMMONIA" in the last 168 hours. Coagulation Profile: No results for input(s): "INR", "PROTIME" in the last 168 hours. Cardiac Enzymes: No results for input(s): "CKTOTAL", "CKMB", "CKMBINDEX", "TROPONINI" in the last 168 hours. BNP (last 3 results) No results for input(s): "PROBNP" in the last 8760 hours. HbA1C: No results for input(s): "HGBA1C" in the last 72 hours. CBG: Recent Labs  Lab 11/18/23 1957 11/18/23 2031  GLUCAP 298* 305*   Lipid Profile: No results for  input(s): "CHOL", "HDL", "LDLCALC", "TRIG", "CHOLHDL", "LDLDIRECT" in the last 72 hours. Thyroid Function Tests: No results for input(s): "TSH", "T4TOTAL", "FREET4", "T3FREE", "THYROIDAB" in the last 72 hours. Anemia Panel: No results for input(s): "VITAMINB12", "FOLATE", "FERRITIN", "TIBC", "IRON", "RETICCTPCT" in the last 72 hours. Urine analysis:    Component Value Date/Time   COLORURINE YELLOW (A) 11/18/2023 1716   APPEARANCEUR HAZY (A) 11/18/2023 1716   LABSPEC 1.015 11/18/2023 1716   PHURINE 5.0 11/18/2023 1716   GLUCOSEU 150 (A) 11/18/2023 1716   HGBUR LARGE (A) 11/18/2023 1716   BILIRUBINUR NEGATIVE 11/18/2023 1716   BILIRUBINUR negative 02/23/2018 1048   BILIRUBINUR neg 03/01/2015 1132   KETONESUR 5 (A) 11/18/2023 1716   PROTEINUR 30 (A) 11/18/2023 1716   UROBILINOGEN 0.2 02/23/2018 1048   NITRITE POSITIVE (A) 11/18/2023 1716   LEUKOCYTESUR NEGATIVE 11/18/2023 1716   Sepsis Labs: @LABRCNTIP (procalcitonin:4,lacticidven:4) )No results found for this or any previous visit (from the past 240 hours).   Radiological Exams on Admission:   Assessment/Plan Principal Problem:   Complicated UTI (urinary tract infection) Active Problems:   Adrenal insufficiency due to cancer therapy (HCC)   Acute renal failure superimposed on stage 3a chronic kidney disease (HCC)   Coronary artery disease   Hypothyroidism   Chronic deep vein thrombosis (DVT) of lower extremity (HCC)   History of DVT (deep vein thrombosis)   A-fib (HCC)   Type II diabetes mellitus with renal manifestations (HCC)   Essential hypertension   History of bladder cancer   Assessment and Plan:  Complicated UTI (urinary tract infection): Patient does not have fever or leukocytosis.  Lactic acid is normal.  Clinically not septic.  -Place in telemetry bed for observation -IV cefepime -Follow-up blood culture and urine culture  Adrenal insufficiency due to cancer therapy [facility]): Blood pressure soft -Hold home  Cortef -Start Solu-Cortef 100 mg every 8 hour as stress dose  Acute renal failure superimposed on stage 3a chronic kidney disease (HCC): Recent baseline creatinine 1.38 on 10/19/2023.  His creatinine is 1.7 5, BUN 21 and GFR 39.  Likely due to UTI -IV fluid: 1.5 L normal saline, then 75 cc/h  Coronary artery disease: No chest  pain -Continue Lipitor  Hypothyroidism -Synthroid  History of DVT (deep vein thrombosis) -Eliquis  A-fib (HCC): Heart rate 100s --> 80s -Eliquis  Type II diabetes mellitus with renal manifestations [facility]): Recent A1c 8.6, poorly controlled.  Patient taking NovoLog and Lantus 48 units daily -Sliding scale insulin -Glargine insulin 30 units daily  Essential hypertension -IV hydralazine as needed  History of bladder cancer: S/p of bladder resection -S/p of urostomy         DVT ppx: on Eliquis  Code Status: Full code    Family Communication:  Yes, patient's wife  at bed side.      Disposition Plan:  Anticipate discharge back to previous environment  Consults called:  none  Admission status and Level of care: Telemetry Medical:    for obs      Dispo: The patient is from: Home              Anticipated d/c is to: Home              Anticipated d/c date is: 1 day              Patient currently is not medically stable to d/c.    Severity of Illness:  The appropriate patient status for this patient is OBSERVATION. Observation status is judged to be reasonable and necessary in order to provide the required intensity of service to ensure the patient's safety. The patient's presenting symptoms, physical exam findings, and initial radiographic and laboratory data in the context of their medical condition is felt to place them at decreased risk for further clinical deterioration. Furthermore, it is anticipated that the patient will be medically stable for discharge from the hospital within 2 midnights of admission.        Date of Service 11/19/2023     Lorretta Harp [facility]   If 7PM-7AM, please contact night-coverage [email or web address] 11/19/2023, 12:21 AM

## 2023-11-19 DIAGNOSIS — N390 Urinary tract infection, site not specified: Secondary | ICD-10-CM | POA: Diagnosis not present

## 2023-11-19 LAB — BASIC METABOLIC PANEL
Anion gap: 9 (ref 5–15)
BUN: 22 mg/dL (ref 8–23)
CO2: 20 mmol/L — ABNORMAL LOW (ref 22–32)
Calcium: 8.6 mg/dL — ABNORMAL LOW (ref 8.9–10.3)
Chloride: 101 mmol/L (ref 98–111)
Creatinine, Ser: 1.62 mg/dL — ABNORMAL HIGH (ref 0.61–1.24)
GFR, Estimated: 43 mL/min — ABNORMAL LOW (ref >60)
Glucose, Bld: 337 mg/dL — ABNORMAL HIGH (ref 70–99)
Potassium: 5 mmol/L (ref 3.5–5.1)
Sodium: 130 mmol/L — ABNORMAL LOW (ref 135–145)

## 2023-11-19 LAB — GLUCOSE, CAPILLARY
Glucose-Capillary: 345 mg/dL — ABNORMAL HIGH (ref 70–99)
Glucose-Capillary: 296 mg/dL — ABNORMAL HIGH (ref 70–99)
Glucose-Capillary: 327 mg/dL — ABNORMAL HIGH (ref 70–99)
Glucose-Capillary: 425 mg/dL — ABNORMAL HIGH (ref 70–99)
Glucose-Capillary: 369 mg/dL — ABNORMAL HIGH (ref 70–99)
Glucose-Capillary: 547 mg/dL (ref 70–99)

## 2023-11-19 LAB — CBC
HCT: 32.3 % — ABNORMAL LOW (ref 39.0–52.0)
Hemoglobin: 11.1 g/dL — ABNORMAL LOW (ref 13.0–17.0)
MCH: 34.4 pg — ABNORMAL HIGH (ref 26.0–34.0)
MCHC: 34.4 g/dL (ref 30.0–36.0)
MCV: 100 fL (ref 80.0–100.0)
Platelets: 188 10*3/uL (ref 150–400)
RBC: 3.23 MIL/uL — ABNORMAL LOW (ref 4.22–5.81)
RDW: 13.4 % (ref 11.5–15.5)
WBC: 5.4 10*3/uL (ref 4.0–10.5)
nRBC: 0 % (ref 0.0–0.2)

## 2023-11-19 LAB — GLUCOSE, RANDOM: Glucose, Bld: 592 mg/dL (ref 70–99)

## 2023-11-19 MED ORDER — LEVOTHYROXINE SODIUM 112 MCG PO TABS
112.0000 ug | ORAL_TABLET | Freq: Every day | ORAL | Status: DC
  Administered 2023-11-19 – 2023-11-21 (×3): 112 ug via ORAL
  Filled 2023-11-19 (×3): qty 1

## 2023-11-19 MED ORDER — NITROGLYCERIN 0.4 MG SL SUBL: 0.4000 mg | SUBLINGUAL_TABLET | SUBLINGUAL | Status: DC | PRN

## 2023-11-19 MED ORDER — INSULIN GLARGINE-YFGN 100 UNIT/ML ~~LOC~~ SOLN
30.0000 [IU] | Freq: Every day | SUBCUTANEOUS | Status: DC
  Administered 2023-11-19 (×2): 30 [IU] via SUBCUTANEOUS
  Filled 2023-11-19 (×2): qty 0.3

## 2023-11-19 MED ORDER — GABAPENTIN 300 MG PO CAPS
600.0000 mg | ORAL_CAPSULE | Freq: Two times a day (BID) | ORAL | Status: DC
  Administered 2023-11-19 – 2023-11-21 (×6): 600 mg via ORAL
  Filled 2023-11-19 (×6): qty 2

## 2023-11-19 MED ORDER — INSULIN ASPART 100 UNIT/ML IJ SOLN
20.0000 [IU] | Freq: Once | INTRAMUSCULAR | Status: AC
  Administered 2023-11-19: 20 [IU] via SUBCUTANEOUS
  Filled 2023-11-19: qty 1

## 2023-11-19 MED ORDER — HYDROCORTISONE SOD SUC (PF) 100 MG IJ SOLR
50.0000 mg | Freq: Four times a day (QID) | INTRAMUSCULAR | Status: DC
  Administered 2023-11-19 – 2023-11-20 (×3): 50 mg via INTRAVENOUS
  Filled 2023-11-19 (×4): qty 1

## 2023-11-19 MED ORDER — ENSURE ENLIVE PO LIQD
237.0000 mL | Freq: Two times a day (BID) | ORAL | Status: DC
  Administered 2023-11-19: 237 mL via ORAL

## 2023-11-19 MED ORDER — FLUTICASONE PROPIONATE 50 MCG/ACT NA SUSP: 2.0000 | Freq: Every day | NASAL | Status: DC | PRN

## 2023-11-19 MED ORDER — LACTATED RINGERS IV SOLN: INTRAVENOUS | Status: AC

## 2023-11-19 MED ORDER — ATORVASTATIN CALCIUM 20 MG PO TABS
10.0000 mg | ORAL_TABLET | Freq: Every day | ORAL | Status: DC
  Administered 2023-11-19 – 2023-11-20 (×2): 10 mg via ORAL
  Filled 2023-11-19 (×2): qty 1

## 2023-11-19 NOTE — Inpatient Diabetes Management (Addendum)
 Inpatient Diabetes Program Recommendations  AACE/ADA: New Consensus Statement on Inpatient Glycemic Control (2015)  Target Ranges:  Prepandial:   less than 140 mg/dL      Peak postprandial:   less than 180 mg/dL (1-2 hours)      Critically ill patients:  140 - 180 mg/dL    Latest Reference Range & Units 11/18/23 19:57 11/18/23 20:31 11/19/23 01:56  Glucose-Capillary 70 - 99 mg/dL 132 (H) 440 (H)  3 units Novolog  345 (H)   30 units Semglee  (H): Data is abnormally high  Latest Reference Range & Units 11/19/23 07:44  Glucose-Capillary 70 - 99 mg/dL 102 (H)  9 units Novolog   (H): Data is abnormally high    Home DM Meds: Novolog 25-40 units TID      Lantus 48 units at HS      PO Cortef     Dexcom G7 CGM   Current Orders: Semglee 30 units at HS    Novolog 0-9 units TID ac/hs    MD- Note pt getting Solucortef 100 mg Q8H.  CBG 296 this AM  Please consider:  1. Increase Semglee to 40 units at bedtime (takes 48 units at home)  2. Increase Novolog SSI to the 0-15 unit Moderate scale (may want to change frequency to Q4 hours while pt getting IV Steroids)    --Will follow patient during hospitalization--  Ambrose Finland RN, MSN, CDCES Diabetes Coordinator Inpatient Glycemic Control Team Team Pager: [phone] (8a-5p)

## 2023-11-19 NOTE — Hospital Course (Signed)
 82yo with h/o metastatic bladder CA s/p urostomy, DM, afib on Eliquis, HTN, CAD, DM, adrenal insufficiency, stage 3a CKD, and hypothyroidism who presented on 3/25 with n/v.  He was diagnosed with a complicated UTI and started on Cefepime.  Also with AKI on baseline stage 3a CKD.

## 2023-11-19 NOTE — Plan of Care (Signed)

## 2023-11-19 NOTE — Plan of Care (Signed)
  Problem: Education: Goal: Ability to describe self-care measures that may prevent or decrease complications (Diabetes Survival Skills Education) will improve Outcome: Progressing   Problem: Coping: Goal: Ability to adjust to condition or change in health will improve Outcome: Progressing   Problem: Nutritional: Goal: Maintenance of adequate nutrition will improve Outcome: Progressing   Problem: Skin Integrity: Goal: Risk for impaired skin integrity will decrease Outcome: Progressing   Problem: Elimination: Goal: Will not experience complications related to bowel motility Outcome: Progressing Goal: Will not experience complications related to urinary retention Outcome: Progressing   Problem: Safety: Goal: Ability to remain free from injury will improve Outcome: Progressing

## 2023-11-19 NOTE — Progress Notes (Signed)
 Hyperglycemic event 2011 BS 547, Glucose random ordered per protocol, NP Manuela Schwartz notified Labs verified BS [id number] NP notified, New orders:  Novolog 20 units given once Recheck blood sugar 0300

## 2023-11-19 NOTE — Progress Notes (Signed)
 Progress Note   Patient: Christian [name] [id number]:[id number] DOB: 1941-07-23 DOA: 11/18/2023     0 DOS: the patient was seen and examined on 11/19/2023   Brief hospital course: 83yo with h/o metastatic bladder CA s/p urostomy, DM, afib on Eliquis, HTN, CAD, DM, adrenal insufficiency, stage 3a CKD, and hypothyroidism who presented on 3/25 with n/v.  He was diagnosed with a complicated UTI and started on Cefepime.  Also with AKI on baseline stage 3a CKD.  Assessment and Plan:  Complicated UTI (urinary tract infection) Patient reports 2 prior recent UTIs in February requiring hospitalization, although only one culture is reported on 2/21 and it was negative Presented with n/v, this time with abnormal UA and culture is growing >100k colonies E coli, sensitivities pending No concern for sepsis Continue IV cefepime while awaiting sensitivities   Adrenal insufficiency due to cancer therapy  Blood pressure soft, currently 99/61 Hold home Cortef Start Solu-Cortef 50 mg every 6 hour as stress dose   AKI superimposed on stage 3a chronic kidney disease Recent baseline creatinine 1.38 on 10/19/2023 Presenting creatinine was 1.75 and GFR 39, improving With low Na++ and marginal CO2, will give another 500 cc LR (over 5 hours) Recheck BMP in AM   Coronary artery disease No chest pain Continue Lipitor   Hypothyroidism Continue Synthroid   History of DVT (deep vein thrombosis) Continue Eliquis   A-fib  Heart rate 100s --> 80s Continue Eliquis   Type II diabetes mellitus with renal manifestations  Recent A1c 8.6, poorly controlled Continue Lantus but 30 units rather than home 48 units daily Moderate-scale SSI Continue gabapentin   History of bladder cancer S/p of bladder resection with urostomy Stable disease as of office visit 3/14 Due for repeat scans q3 months    Consultants: None  Procedures: None  Antibiotics: Cefepime 3/25-  30 Day Unplanned Readmission Risk Score     Flowsheet Row ED to Hosp-Admission (Discharged) from 10/17/2023 in [facility] (1A)  30 Day Unplanned Readmission Risk Score (%) 25.91 Filed at 10/20/2023 1200       This score is the patient's risk of an unplanned readmission within 30 days of being discharged (0 -100%). The score is based on dignosis, age, lab data, medications, orders, and past utilization.   Low:  0-14.9   Medium: 15-21.9   High: 22-29.9   Extreme: 30 and above           Subjective: He feels much better today.  He and his wife want to make sure he doesn't get discharged too soon so that he doesn't have to return tot he ER again.   Objective: Vitals:   11/19/23 1138 11/19/23 1636  BP: (!) 103/55 99/61  Pulse: 78 81  Resp: 18 18  Temp: 98.1 F (36.7 C) 98.1 F (36.7 C)  SpO2: 93% 94%    Intake/Output Summary (Last 24 hours) at 11/19/2023 1653 Last data filed at 11/19/2023 1036 Gross per 24 hour  Intake 2210.25 ml  Output 1000 ml  Net 1210.25 ml   Filed Weights   11/18/23 1125  Weight: 108.9 kg    Exam:  General:  Appears calm and comfortable and is in NAD Eyes:   EOMI, normal lids, iris ENT:  grossly normal hearing, lips & tongue, mmm Cardiovascular:  RRR, no m/r/g. No LE edema.  Respiratory:   CTA bilaterally with no wheezes/rales/rhonchi.  Normal respiratory effort. Abdomen:  soft, NT, ND; + urostomy with clear urine output Skin:  no  rash or induration seen on limited exam Musculoskeletal:  grossly normal tone BUE/BLE, good ROM, no bony abnormality Psychiatric:  grossly normal mood and affect, speech fluent and appropriate, AOx3 Neurologic:  CN 2-12 grossly intact, moves all extremities in coordinated fashion  Data Reviewed: I have reviewed the patient's lab results since admission.  Pertinent labs for today include:   Na++ 130 CO2 20 Glucose 337 BUN 22/Creatinine 1.62/GFR 43, 21/1.75/39 on presentation; baseline 1.38/52 WBC 5.4 Hgb 11.1 Blood  cultures NTD     Family Communication: Wife was present throughout evaluation  Disposition: Status is: Observation The patient remains OBS appropriate and will d/c before 2 midnights.     Time spent: 50 minutes  Unresulted Labs (From admission, onward)     Start     Ordered   11/20/23 0500  CBC with Differential/Platelet  Tomorrow morning,   R        11/19/23 1653   11/20/23 0500  Basic metabolic panel  Tomorrow morning,   R        11/19/23 1653             Author: Jonah Blue, MD 11/19/2023 4:53 PM  For on call review [email or web address].

## 2023-11-20 DIAGNOSIS — B9620 Unspecified Escherichia coli [E. coli] as the cause of diseases classified elsewhere: Secondary | ICD-10-CM | POA: Diagnosis present

## 2023-11-20 DIAGNOSIS — Z794 Long term (current) use of insulin: Secondary | ICD-10-CM | POA: Diagnosis not present

## 2023-11-20 DIAGNOSIS — Z79890 Hormone replacement therapy (postmenopausal): Secondary | ICD-10-CM | POA: Diagnosis not present

## 2023-11-20 DIAGNOSIS — N390 Urinary tract infection, site not specified: Secondary | ICD-10-CM | POA: Diagnosis present

## 2023-11-20 DIAGNOSIS — N1831 Chronic kidney disease, stage 3a: Secondary | ICD-10-CM | POA: Diagnosis present

## 2023-11-20 DIAGNOSIS — T451X5A Adverse effect of antineoplastic and immunosuppressive drugs, initial encounter: Secondary | ICD-10-CM | POA: Diagnosis present

## 2023-11-20 DIAGNOSIS — C772 Secondary and unspecified malignant neoplasm of intra-abdominal lymph nodes: Secondary | ICD-10-CM | POA: Diagnosis present

## 2023-11-20 DIAGNOSIS — Z906 Acquired absence of other parts of urinary tract: Secondary | ICD-10-CM | POA: Diagnosis not present

## 2023-11-20 DIAGNOSIS — N179 Acute kidney failure, unspecified: Secondary | ICD-10-CM | POA: Diagnosis present

## 2023-11-20 DIAGNOSIS — I719 Aortic aneurysm of unspecified site, without rupture: Secondary | ICD-10-CM | POA: Diagnosis present

## 2023-11-20 DIAGNOSIS — E1122 Type 2 diabetes mellitus with diabetic chronic kidney disease: Secondary | ICD-10-CM | POA: Diagnosis present

## 2023-11-20 DIAGNOSIS — Z7901 Long term (current) use of anticoagulants: Secondary | ICD-10-CM | POA: Diagnosis not present

## 2023-11-20 DIAGNOSIS — G8929 Other chronic pain: Secondary | ICD-10-CM | POA: Diagnosis present

## 2023-11-20 DIAGNOSIS — E039 Hypothyroidism, unspecified: Secondary | ICD-10-CM | POA: Diagnosis present

## 2023-11-20 DIAGNOSIS — Z1611 Resistance to penicillins: Secondary | ICD-10-CM | POA: Diagnosis present

## 2023-11-20 DIAGNOSIS — E785 Hyperlipidemia, unspecified: Secondary | ICD-10-CM | POA: Diagnosis present

## 2023-11-20 DIAGNOSIS — E273 Drug-induced adrenocortical insufficiency: Secondary | ICD-10-CM | POA: Diagnosis present

## 2023-11-20 DIAGNOSIS — I2510 Atherosclerotic heart disease of native coronary artery without angina pectoris: Secondary | ICD-10-CM | POA: Diagnosis present

## 2023-11-20 DIAGNOSIS — Z86718 Personal history of other venous thrombosis and embolism: Secondary | ICD-10-CM | POA: Diagnosis not present

## 2023-11-20 DIAGNOSIS — Z8551 Personal history of malignant neoplasm of bladder: Secondary | ICD-10-CM | POA: Diagnosis not present

## 2023-11-20 DIAGNOSIS — Z936 Other artificial openings of urinary tract status: Secondary | ICD-10-CM | POA: Diagnosis not present

## 2023-11-20 DIAGNOSIS — E871 Hypo-osmolality and hyponatremia: Secondary | ICD-10-CM | POA: Diagnosis present

## 2023-11-20 DIAGNOSIS — R112 Nausea with vomiting, unspecified: Secondary | ICD-10-CM | POA: Diagnosis present

## 2023-11-20 DIAGNOSIS — I480 Paroxysmal atrial fibrillation: Secondary | ICD-10-CM | POA: Diagnosis present

## 2023-11-20 DIAGNOSIS — I129 Hypertensive chronic kidney disease with stage 1 through stage 4 chronic kidney disease, or unspecified chronic kidney disease: Secondary | ICD-10-CM | POA: Diagnosis present

## 2023-11-20 LAB — URINE CULTURE: Culture: >=100000 — AB

## 2023-11-20 LAB — GLUCOSE, CAPILLARY
Glucose-Capillary: 374 mg/dL — ABNORMAL HIGH (ref 70–99)
Glucose-Capillary: 390 mg/dL — ABNORMAL HIGH (ref 70–99)
Glucose-Capillary: 489 mg/dL — ABNORMAL HIGH (ref 70–99)
Glucose-Capillary: 559 mg/dL (ref 70–99)
Glucose-Capillary: 444 mg/dL — ABNORMAL HIGH (ref 70–99)
Glucose-Capillary: 214 mg/dL — ABNORMAL HIGH (ref 70–99)

## 2023-11-20 LAB — BASIC METABOLIC PANEL WITH GFR
Anion gap: 11 (ref 5–15)
BUN: 28 mg/dL — ABNORMAL HIGH (ref 8–23)
CO2: 22 mmol/L (ref 22–32)
Calcium: 8.8 mg/dL — ABNORMAL LOW (ref 8.9–10.3)
Chloride: 101 mmol/L (ref 98–111)
Creatinine, Ser: 1.54 mg/dL — ABNORMAL HIGH (ref 0.61–1.24)
GFR, Estimated: 45 mL/min — ABNORMAL LOW (ref >60)
Glucose, Bld: 371 mg/dL — ABNORMAL HIGH (ref 70–99)
Potassium: 3.9 mmol/L (ref 3.5–5.1)
Sodium: 134 mmol/L — ABNORMAL LOW (ref 135–145)

## 2023-11-20 LAB — CBC WITH DIFFERENTIAL/PLATELET
Abs Immature Granulocytes: 0.05 10*3/uL (ref 0.00–0.07)
Basophils Absolute: 0 10*3/uL (ref 0.0–0.1)
Basophils Relative: 0 %
Eosinophils Absolute: 0 10*3/uL (ref 0.0–0.5)
Eosinophils Relative: 0 %
HCT: 29.6 % — ABNORMAL LOW (ref 39.0–52.0)
Hemoglobin: 10.3 g/dL — ABNORMAL LOW (ref 13.0–17.0)
Immature Granulocytes: 1 %
Lymphocytes Relative: 11 %
Lymphs Abs: 1 10*3/uL (ref 0.7–4.0)
MCH: 34.6 pg — ABNORMAL HIGH (ref 26.0–34.0)
MCHC: 34.8 g/dL (ref 30.0–36.0)
MCV: 99.3 fL (ref 80.0–100.0)
Monocytes Absolute: 0.4 10*3/uL (ref 0.1–1.0)
Monocytes Relative: 5 %
Neutro Abs: 7.1 10*3/uL (ref 1.7–7.7)
Neutrophils Relative %: 83 %
Platelets: 178 10*3/uL (ref 150–400)
RBC: 2.98 MIL/uL — ABNORMAL LOW (ref 4.22–5.81)
RDW: 13.2 % (ref 11.5–15.5)
WBC: 8.5 10*3/uL (ref 4.0–10.5)
nRBC: 0 % (ref 0.0–0.2)

## 2023-11-20 MED ORDER — HYDROCORTISONE 10 MG PO TABS: 10.0000 mg | ORAL_TABLET | Freq: Two times a day (BID) | ORAL | Status: DC

## 2023-11-20 MED ORDER — AMOXICILLIN-POT CLAVULANATE 875-125 MG PO TABS
1.0000 | ORAL_TABLET | Freq: Two times a day (BID) | ORAL | Status: DC
  Administered 2023-11-20: 1 via ORAL
  Filled 2023-11-20: qty 1

## 2023-11-20 MED ORDER — INSULIN ASPART 100 UNIT/ML IJ SOLN
0.0000 [IU] | Freq: Three times a day (TID) | INTRAMUSCULAR | Status: DC
Start: 2023-11-20 — End: 2023-11-21
  Administered 2023-11-20 (×2): 20 [IU] via SUBCUTANEOUS
  Administered 2023-11-21: 11 [IU] via SUBCUTANEOUS
  Filled 2023-11-20 (×3): qty 1

## 2023-11-20 MED ORDER — HYDROCORTISONE 10 MG PO TABS
20.0000 mg | ORAL_TABLET | Freq: Every day | ORAL | Status: DC
  Administered 2023-11-20 – 2023-11-21 (×2): 20 mg via ORAL
  Filled 2023-11-20 (×3): qty 2

## 2023-11-20 MED ORDER — INSULIN GLARGINE-YFGN 100 UNIT/ML ~~LOC~~ SOLN
10.0000 [IU] | Freq: Once | SUBCUTANEOUS | Status: AC
  Administered 2023-11-20: 10 [IU] via SUBCUTANEOUS
  Filled 2023-11-20: qty 0.1

## 2023-11-20 MED ORDER — HYDROCORTISONE 10 MG PO TABS
10.0000 mg | ORAL_TABLET | Freq: Every day | ORAL | Status: DC
  Administered 2023-11-20: 10 mg via ORAL
  Filled 2023-11-20 (×2): qty 1

## 2023-11-20 MED ORDER — INSULIN GLARGINE-YFGN 100 UNIT/ML ~~LOC~~ SOLN
40.0000 [IU] | Freq: Every day | SUBCUTANEOUS | Status: DC
  Administered 2023-11-20: 40 [IU] via SUBCUTANEOUS
  Filled 2023-11-20 (×2): qty 0.4

## 2023-11-20 MED ORDER — INSULIN ASPART 100 UNIT/ML IJ SOLN
6.0000 [IU] | Freq: Three times a day (TID) | INTRAMUSCULAR | Status: DC
  Administered 2023-11-20 – 2023-11-21 (×3): 6 [IU] via SUBCUTANEOUS
  Filled 2023-11-20 (×3): qty 1

## 2023-11-20 MED ORDER — CEPHALEXIN 500 MG PO CAPS
500.0000 mg | ORAL_CAPSULE | Freq: Two times a day (BID) | ORAL | Status: DC
  Administered 2023-11-20 – 2023-11-21 (×2): 500 mg via ORAL
  Filled 2023-11-20 (×2): qty 1

## 2023-11-20 MED ORDER — AMOXICILLIN-POT CLAVULANATE 875-125 MG PO TABS: 1.0000 | ORAL_TABLET | Freq: Two times a day (BID) | ORAL | Status: DC

## 2023-11-20 NOTE — Evaluation (Signed)
 Physical Therapy Evaluation Patient Details Name: Christian [name] MRN: [id number] DOB: Aug 30, 1940 Today's Date: 11/20/2023  History of Present Illness  Pt is an 83 yo male that presented to ED for nausea, vomiting, workup for complicated UTI, AKI. PMH includes insulin-dependent diabetes mellitus type 1, paroxysmal atrial fibrillation, coronary artery disease, HTN, history of bladder cancer s/p metastasis to intra abdominal lymph nodes s/p urostomy, adrenal insufficiency, due to cancer therapy.   Clinical Impression  Patient alert, agreeable to PT, motivated to mobilize. Per pt he lives alone at home, recently using a rollator. He was able to transfer modI and ambulate >35ft modI as well. Pt did often stop to speak to staff/patients in hallway, pleasant, no LOB. Would benefit from BERG next session to fully assess any higher level balance deficits, and potentially LRAD.       If plan is discharge home, recommend the following: Assistance with cooking/housework;Assist for transportation;Help with stairs or ramp for entrance   Can travel by private vehicle        Equipment Recommendations None recommended by PT  Recommendations for Other Services       Functional Status Assessment Patient has had a recent decline in their functional status and demonstrates the ability to make significant improvements in function in a reasonable and predictable amount of time.     Precautions / Restrictions Precautions Precautions: Fall Recall of Precautions/Restrictions: Intact Restrictions Weight Bearing Restrictions Per Provider Order: No      Mobility  Bed Mobility               General bed mobility comments: pt up in recliner at start/end of session    Transfers Overall transfer level: Modified independent Equipment used: Rolling walker (2 wheels)                    Ambulation/Gait Ambulation/Gait assistance: Modified independent (Device/Increase time) Gait Distance  (Feet): 350 Feet Assistive device: Rolling walker (2 wheels)         General Gait Details: no LOB, pt able to converse and ambulate, did tend to stop to talk to staff  Stairs            Wheelchair Mobility     Tilt Bed    Modified Rankin (Stroke Patients Only)       Balance Overall balance assessment: Needs assistance Sitting-balance support: Feet supported Sitting balance-Leahy Scale: Good     Standing balance support: During functional activity, Bilateral upper extremity supported Standing balance-Leahy Scale: Good                               Pertinent Vitals/Pain Pain Assessment Pain Assessment: No/denies pain    Home Living Family/patient expects to be discharged to:: Private residence Living Arrangements: Spouse/significant other Available Help at Discharge: Family Type of Home: House Home Access: Stairs to enter Entrance Stairs-Rails: Right;Left;Can reach both Entrance Stairs-Number of Steps: 3 from front entrance; 4 from back entrance   Home Layout: One level Home Equipment: Agricultural consultant (2 wheels);Cane - single point;BSC/3in1;Shower seat - built in;Hand held shower head;Wheelchair - manual;Grab bars - tub/shower      Prior Function Prior Level of Function : Independent/Modified Independent             Mobility Comments: recent use of rollator, previously independent ADLs Comments: MOD I/IND; reports sits/stands in shower     Extremity/Trunk Assessment   Upper Extremity Assessment Upper Extremity Assessment: Defer  to OT evaluation    Lower Extremity Assessment Lower Extremity Assessment: Overall WFL for tasks assessed       Communication        Cognition Arousal: Alert Behavior During Therapy: WFL for tasks assessed/performed   PT - Cognitive impairments: No apparent impairments                                 Cueing       General Comments      Exercises     Assessment/Plan    PT  Assessment Patient needs continued PT services  PT Problem List Decreased activity tolerance;Decreased balance;Decreased mobility       PT Treatment Interventions DME instruction;Balance training;Gait training;Neuromuscular re-education;Stair training;Functional mobility training;Patient/family education;Therapeutic activities;Therapeutic exercise    PT Goals (Current goals can be found in the Care Plan section)  Acute Rehab PT Goals Patient Stated Goal: to go home PT Goal Formulation: With patient Time For Goal Achievement: 12/04/23 Potential to Achieve Goals: Good Additional Goals Additional Goal #1: The pt will demonstrate a BERG score of 45 or greater to indicate low fall risk.    Frequency Min 1X/week     Co-evaluation               AM-PAC PT "6 Clicks" Mobility  Outcome Measure Help needed turning from your back to your side while in a flat bed without using bedrails?: None Help needed moving from lying on your back to sitting on the side of a flat bed without using bedrails?: None Help needed moving to and from a bed to a chair (including a wheelchair)?: None Help needed standing up from a chair using your arms (e.g., wheelchair or bedside chair)?: None Help needed to walk in hospital room?: None Help needed climbing 3-5 steps with a railing? : A Little 6 Click Score: 23    End of Session Equipment Utilized During Treatment: Gait belt Activity Tolerance: Patient tolerated treatment well Patient left: in chair;with call bell/phone within reach Nurse Communication: Mobility status PT Visit Diagnosis: Other abnormalities of gait and mobility (R26.89);Difficulty in walking, not elsewhere classified (R26.2);Muscle weakness (generalized) (M62.81)    Time: 1435-1450 PT Time Calculation (min) (ACUTE ONLY): 15 min   Charges:   PT Evaluation $PT Eval Low Complexity: 1 Low PT Treatments $Therapeutic Activity: 8-22 mins PT General Charges $$ ACUTE PT VISIT: 1  Visit        Olga Coaster PT, DPT 3:58 PM,11/20/23

## 2023-11-20 NOTE — Progress Notes (Signed)
 Patients glucose levels were 559, rechecked were 489. MD advised awaiting for response.

## 2023-11-20 NOTE — Progress Notes (Signed)
 OT Cancellation Note  Patient Details Name: Christian [name] MRN: [id number] DOB: Dec 24, 1940   Cancelled Treatment:    Reason Eval/Treat Not Completed: Other (comment). Consult received, chart reviewed. Recent BG 444. Will hold OT evaluation and re-evaluate next date as medically appropriate.   Arman Filter., MPH, MS, OTR/L ascom [phone] 11/20/23, 4:42 PM

## 2023-11-20 NOTE — Inpatient Diabetes Management (Signed)
 Inpatient Diabetes Program Recommendations  AACE/ADA: New Consensus Statement on Inpatient Glycemic Control (2015)  Target Ranges:  Prepandial:   less than 140 mg/dL      Peak postprandial:   less than 180 mg/dL (1-2 hours)      Critically ill patients:  140 - 180 mg/dL    Latest Reference Range & Units 11/19/23 07:44 11/19/23 11:40 11/19/23 13:48 11/19/23 16:33 11/19/23 20:11 11/20/23 02:39  Glucose-Capillary 70 - 99 mg/dL 629 (H)  9 units Novolog  327 (H)  7 units Novolog  425 (H) 369 (H)  9 units Novolog  547 (HH)  20 units Novolog  30 units Semglee 374 (H)  (HH): Data is critically high (H): Data is abnormally high  Latest Reference Range & Units 11/20/23 07:31  Glucose-Capillary 70 - 99 mg/dL 528 (H)  (H): Data is abnormally high   Home DM Meds: Novolog 25-40 units TID      Lantus 48 units at HS      PO Cortef     Dexcom G7 CGM     Current Orders: Semglee 30 units at HS    Novolog 0-9 units TID ac/hs       MD- Note pt getting Solucortef 50 mg Q6H.  Severely elevated CBGs. Please consider:   1. Increase Semglee to 40 units at bedtime (takes 48 units at home)   2. Increase Novolog SSI to the 0-15 unit Moderate scale (may want to change frequency to Q4 hours while pt getting IV Steroids)  3. Start Novolog Meal Coverage: Novolog 6 units TID with meals HOLD if pt NPO HOLD if pt eats <50% meals    --Will follow patient during hospitalization--  Ambrose Finland RN, MSN, CDCES Diabetes Coordinator Inpatient Glycemic Control Team Team Pager: [phone] (8a-5p)

## 2023-11-20 NOTE — Progress Notes (Signed)
 0300 BS 374 NP Vickey Huger notified, no new orders

## 2023-11-20 NOTE — Progress Notes (Addendum)
 Progress Note   Patient: Christian [name] ZOX:[id number] DOB: 11-27-1940 DOA: 11/18/2023     0 DOS: the patient was seen and examined on 11/20/2023   Brief hospital course: 82yo with h/o metastatic bladder CA s/p urostomy, DM, afib on Eliquis, HTN, CAD, DM, adrenal insufficiency, stage 3a CKD, and hypothyroidism who presented on 3/25 with n/v.  He was diagnosed with a complicated UTI and started on Cefepime.  Also with AKI on baseline stage 3a CKD.  Culture grew E Coli and he was transitioned to PO antibiotics and home steroids resumed.  Assessment and Plan:  Complicated UTI (urinary tract infection) Patient reports 2 prior recent UTIs in February requiring hospitalization, although only one culture is reported on 2/21 and it was negative Presented with n/v, this time with abnormal UA and culture is growing >100k colonies E coli, sensitivities back No concern for sepsis In further review, this is similar to his urine culture from 3/11; he was changed to Augmentin on 3/17 (appropriately, based on sensitivities) but did not improve and then presented to the ER accordingly Cefepime -> Cephalexin based on sensitivities for a total of 10 days I have asked his PCP to reach out to urological oncology re: need for continuous prophylaxis following treatment to prevent need for rehospitalization (of note, he was on nitrofurantoin for prophylaxis while he was on chemo)   Adrenal insufficiency due to cancer therapy  Blood pressure soft, currently 99/61 Held home Cortef -> stress dosed steroids Resume home Cortef at this time   AKI superimposed on stage 3a chronic kidney disease Recent baseline creatinine 1.38 on 10/19/2023 Presenting creatinine was 1.75 and GFR 39, improving with current creatinine 1.54, GFR 45 Recheck BMP in AM   Coronary artery disease No chest pain Continue Lipitor   Hypothyroidism Continue Synthroid   History of DVT (deep vein thrombosis) Continue Eliquis   A-fib  Heart  rate 100s --> 80s Continue Eliquis   Type II diabetes mellitus with renal manifestations  Recent A1c 8.6, poorly controlled Poor control while on stress-dosed steroids, will resume home steroids Increase glargine to 40 units qhs (on 48 units nightly at home) and give 10 units glargine this AM Moderate-scale SSI with mealtime dosing Continue gabapentin   History of bladder cancer S/p of bladder resection with urostomy Stable disease as of office visit 3/14 Due for repeat scans q3 months       Consultants: None   Procedures: None   Antibiotics: Cefepime 3/25-27 Augmentin 3/27-4/3  30 Day Unplanned Readmission Risk Score    Flowsheet Row ED to Hosp-Admission (Current) from 11/18/2023 in [facility] 1C MEDICAL TELEMETRY  30 Day Unplanned Readmission Risk Score (%) 31.86 Filed at 11/20/2023 1200       This score is the patient's risk of an unplanned readmission within 30 days of being discharged (0 -100%). The score is based on dignosis, age, lab data, medications, orders, and past utilization.   Low:  0-14.9   Medium: 15-21.9   High: 22-29.9   Extreme: 30 and above           Subjective: Feeling much better, no complaints.  Would like to negotiate staying in the hospital until Saturday to make sure he doesn't have to come right back.  After further discussion, he has arranged a ride for tomorrow at 2 pm.   Objective: Vitals:   11/20/23 0505 11/20/23 0729  BP: (!) 102/52 (!) 117/56  Pulse: 63 67  Resp: 18 17  Temp: 98.3  F (36.8 C) 97.7 F (36.5 C)  SpO2: 95% 94%    Intake/Output Summary (Last 24 hours) at 11/20/2023 1316 Last data filed at 11/20/2023 1100 Gross per 24 hour  Intake 1009.23 ml  Output 2450 ml  Net -1440.77 ml   Filed Weights   11/18/23 1125  Weight: 108.9 kg    Exam:  General:  Appears calm and comfortable and is in NAD Eyes:   EOMI, normal lids, iris ENT:  grossly normal hearing, lips & tongue, mmm Cardiovascular:   RRR, no m/r/g. No LE edema.  Respiratory:   CTA bilaterally with no wheezes/rales/rhonchi.  Normal respiratory effort. Abdomen:  soft, NT, ND; + urostomy with clear urine output Skin:  no rash or induration seen on limited exam Musculoskeletal:  grossly normal tone BUE/BLE, good ROM, no bony abnormality Psychiatric:  grossly normal mood and affect, speech fluent and appropriate, AOx3 Neurologic:  CN 2-12 grossly intact, moves all extremities in coordinated fashion  Data Reviewed: I have reviewed the patient's lab results since admission.  Pertinent labs for today include:  Na++ 134, improved Glucose 371-592 BUN 28/Creatinine 1.54/GFR 45, improved WBC 8.5 Hgb 10.3 Urine culture: E coli, resistant to Ampicillin and Cipro      Family Communication: We spoke with his wife by telephone  Disposition: Status is: Inpatient Remains inpatient appropriate because: ongoing management     Time spent: 50 minutes  Unresulted Labs (From admission, onward)    None        Author: Jonah Blue, MD 11/20/2023 1:16 PM  For on call review [email or web address].

## 2023-11-20 NOTE — TOC Initial Note (Signed)
 Transition of Care [facility]) - Initial/Assessment Note    Patient Details  Name: Christian [name] MRN: [id number] Date of Birth: 03-19-1941  [facility] CM/SW Contact:    Cherre Blanc, RN Phone Number: 11/20/2023, 3:27 PM  Clinical Narrative:                 Patient is from home with his wife who is at the bedside. The patient drives and doesn't have any difficulty obtaining or taking medications. He wife will drive him home from the hospital He has a PCP and has a RW, Rollator, and 3 in 1 from a previous surgery. The plan is for the patient to dc to home with [facility] PT/OT when he is medically clear for dc. TOC will continue to follow for dc planning.  Expected Discharge Plan: Home w Home Health Services Barriers to Discharge: Continued Medical Work up   Patient Goals and CMS Choice            Expected Discharge Plan and Services   Discharge Planning Services: CM Consult   Living arrangements for the past 2 months: Single Family Home                                      Prior Living Arrangements/Services Living arrangements for the past 2 months: Single Family Home Lives with:: Spouse              Current home services: DME (RW, Rollator, 3 in 1, walk in shower with bars)    Activities of Daily Living   ADL Screening (condition at time of admission) Independently performs ADLs?: No Does the patient have a NEW difficulty with bathing/dressing/toileting/self-feeding that is expected to last >3 days?: Yes (Initiates electronic notice to provider for possible OT consult) Does the patient have a NEW difficulty with getting in/out of bed, walking, or climbing stairs that is expected to last >3 days?: Yes (Initiates electronic notice to provider for possible PT consult) Does the patient have a NEW difficulty with communication that is expected to last >3 days?: No Is the patient deaf or have difficulty hearing?: No Does the patient have difficulty seeing,  even when wearing glasses/contacts?: No Does the patient have difficulty concentrating, remembering, or making decisions?: No  Permission Sought/Granted                  Emotional Assessment Appearance:: Appears stated age Attitude/Demeanor/Rapport: Engaged Affect (typically observed): Appropriate Orientation: : Oriented to Place, Oriented to  Time, Oriented to Situation, Oriented to Self   Psych Involvement: No (comment)  Admission diagnosis:  UTI (urinary tract infection) [N39.0] Complicated UTI (urinary tract infection) [N39.0] Nausea without vomiting [R11.0] Patient Active Problem List   Diagnosis Date Noted   Complicated UTI (urinary tract infection) 11/18/2023   Type II diabetes mellitus with renal manifestations (HCC) 11/18/2023   Severe sepsis with acute organ dysfunction (HCC) 10/17/2023   High anion gap metabolic acidosis 10/17/2023   Weakness 10/17/2023   Hypothyroidism 10/17/2023   Chest pressure 10/17/2023   Hypokalemia 10/17/2023   Uncontrolled type 1 diabetes mellitus with hyperglycemia, with long-term current use of insulin (HCC) 10/11/2023   Acute metabolic encephalopathy 10/11/2023   Respiratory syncytial virus (RSV) infection 10/11/2023   Hyperlipidemia    Adrenal insufficiency due to cancer therapy (HCC) 01/28/2023   Long term (current) use of anticoagulants 01/25/2023   A-fib (HCC) 08/26/2022  Acute kidney injury superimposed on chronic kidney disease (HCC) 06/04/2021   Hyponatremia 06/04/2021   Recurrent UTI 06/04/2021   Seasonal allergies 06/04/2021   Anemia due to antineoplastic chemotherapy 03/13/2021   Fever 01/16/2021   Hypomagnesemia 01/03/2021   Bladder cancer metastasized to intra-abdominal lymph nodes (HCC) 11/14/2020   Goals of care, counseling/discussion 09/29/2020   Leukocytosis 09/29/2020   Retroperitoneal lymphadenopathy 09/29/2020   Loss of weight 09/29/2020   Adrenal mass (HCC) 09/29/2020   Essential hypertension 02/13/2018    Sepsis (HCC) 11/17/2017   Chronic deep vein thrombosis (DVT) of lower extremity (HCC) 06/05/2017   History of DVT (deep vein thrombosis) 06/05/2017   Aortic atherosclerosis (HCC) 12/04/2016   Other abnormal glucose 11/04/2013   History of bladder cancer 11/04/2013   Encysted hydrocele 01/20/2013   Kidney stone 01/20/2013   Pure hypercholesterolemia 07/16/2010   OBESITY-MORBID (>100') 07/16/2010   Coronary artery disease 07/16/2010   Abdominal aortic aneurysm (HCC) 07/16/2010   PCP:  Ethelda Chick, MD Pharmacy:   [facility] [phone] - [place], [place] - [address] [place] [address] Phone: [phone] Fax: [phone]     Social Drivers of Health (SDOH) Social History: SDOH Screenings   Food Insecurity: No Food Insecurity (11/18/2023)  Housing: Low Risk  (11/18/2023)  Transportation Needs: No Transportation Needs (11/18/2023)  Utilities: Not At Risk (11/18/2023)  Depression (PHQ2-9): Low Risk  (10/11/2022)  Financial Resource Strain: Low Risk  (11/01/2023)   Received from [facility]  Physical Activity: Insufficiently Active (01/13/2020)   Received from [facility], [facility]  Social Connections: Socially Integrated (11/18/2023)  Stress: No Stress Concern Present (01/13/2020)   Received from [facility], [facility]  Tobacco Use: Low Risk  (11/18/2023)   SDOH Interventions:     Readmission Risk Interventions     No data to display

## 2023-11-21 ENCOUNTER — Other Ambulatory Visit: Payer: Self-pay

## 2023-11-21 DIAGNOSIS — N390 Urinary tract infection, site not specified: Secondary | ICD-10-CM | POA: Diagnosis not present

## 2023-11-21 LAB — BASIC METABOLIC PANEL WITH GFR
Anion gap: 8 (ref 5–15)
BUN: 29 mg/dL — ABNORMAL HIGH (ref 8–23)
CO2: 25 mmol/L (ref 22–32)
Calcium: 8.8 mg/dL — ABNORMAL LOW (ref 8.9–10.3)
Chloride: 107 mmol/L (ref 98–111)
Creatinine, Ser: 1.3 mg/dL — ABNORMAL HIGH (ref 0.61–1.24)
GFR, Estimated: 56 mL/min — ABNORMAL LOW (ref >60)
Glucose, Bld: 72 mg/dL (ref 70–99)
Potassium: 3.5 mmol/L (ref 3.5–5.1)
Sodium: 140 mmol/L (ref 135–145)

## 2023-11-21 LAB — CBC WITH DIFFERENTIAL/PLATELET
Abs Immature Granulocytes: 0.04 10*3/uL (ref 0.00–0.07)
Basophils Absolute: 0.1 10*3/uL (ref 0.0–0.1)
Basophils Relative: 1 %
Eosinophils Absolute: 0.4 10*3/uL (ref 0.0–0.5)
Eosinophils Relative: 5 %
HCT: 30.3 % — ABNORMAL LOW (ref 39.0–52.0)
Hemoglobin: 10.6 g/dL — ABNORMAL LOW (ref 13.0–17.0)
Immature Granulocytes: 1 %
Lymphocytes Relative: 33 %
Lymphs Abs: 2.3 10*3/uL (ref 0.7–4.0)
MCH: 34.1 pg — ABNORMAL HIGH (ref 26.0–34.0)
MCHC: 35 g/dL (ref 30.0–36.0)
MCV: 97.4 fL (ref 80.0–100.0)
Monocytes Absolute: 0.6 10*3/uL (ref 0.1–1.0)
Monocytes Relative: 8 %
Neutro Abs: 3.7 10*3/uL (ref 1.7–7.7)
Neutrophils Relative %: 52 %
Platelets: 201 10*3/uL (ref 150–400)
RBC: 3.11 MIL/uL — ABNORMAL LOW (ref 4.22–5.81)
RDW: 13.3 % (ref 11.5–15.5)
WBC: 7 10*3/uL (ref 4.0–10.5)
nRBC: 0 % (ref 0.0–0.2)

## 2023-11-21 LAB — GLUCOSE, CAPILLARY
Glucose-Capillary: 84 mg/dL (ref 70–99)
Glucose-Capillary: 335 mg/dL — ABNORMAL HIGH (ref 70–99)

## 2023-11-21 MED ORDER — NITROFURANTOIN MONOHYD MACRO 100 MG PO CAPS
100.0000 mg | ORAL_CAPSULE | Freq: Every day | ORAL | 0 refills | Status: DC
  Filled 2023-11-21: qty 30, 30d supply, fill #0

## 2023-11-21 MED ORDER — CEPHALEXIN 500 MG PO CAPS
500.0000 mg | ORAL_CAPSULE | Freq: Two times a day (BID) | ORAL | 0 refills | Status: AC
  Filled 2023-11-21: qty 14, 7d supply, fill #0

## 2023-11-21 NOTE — Evaluation (Signed)
 Occupational Therapy Evaluation Patient Details Name: Christian [name] MRN: [id number] DOB: 07-08-1941 Today's Date: 11/21/2023   History of Present Illness   Pt is an 83 yo male that presented to ED for nausea, vomiting, workup for complicated UTI, AKI. PMH includes insulin-dependent diabetes mellitus type 1, paroxysmal atrial fibrillation, coronary artery disease, HTN, history of bladder cancer s/p metastasis to intra abdominal lymph nodes s/p urostomy, adrenal insufficiency, due to cancer therapy.   Clinical Impressions Pt was seen for OT evaluation this date. Prior to hospital admission, pt was generally independent and uses rollator. Pt lives with his spouse and reports they share in meal prep/cleaning 50/50. Pt denies complaints, endorses feeling "mostly" back to baseline. Pt presents to acute OT demonstrating impaired ADL performance and functional mobility 2/2 mild balance and activity tolerance deficits (See OT problem list for additional functional deficits). Pt currently requires increased time/effort. Pt able to don shoes while seated EOB with set up, completed grooming tasks at sink without UE support and with supv, and ambulated >100' using RW with supv. Pt educated in energy conservation strategies to support his gradual return to PLOF in light of mild activity tolerance and balance impairments. Pt verbalized understanding.  Pt would benefit from skilled OT services upon discharge to address noted impairments and functional limitations (see below for any additional details) in order to maximize safety and independence while minimizing falls risk and caregiver burden.    If plan is discharge home, recommend the following:  Assist for transportation;Assistance with cooking/housework   Functional Status Assessment   Patient has had a recent decline in their functional status and demonstrates the ability to make significant improvements in function in a reasonable and predictable amount of  time.    Equipment Recommendations  None recommended by OT      Precautions/Restrictions   Precautions Precautions: Fall Recall of Precautions/Restrictions: Intact Restrictions Weight Bearing Restrictions Per Provider Order: No     Mobility Bed Mobility Overal bed mobility: Modified Independent     Transfers Overall transfer level: Modified independent Equipment used: Rolling walker (2 wheels)         Balance Overall balance assessment: Needs assistance Sitting-balance support: Feet supported Sitting balance-Leahy Scale: Good     Standing balance support: During functional activity, No upper extremity supported Standing balance-Leahy Scale: Fair        ADL either performed or assessed with clinical judgement   ADL Overall ADL's : Modified independent      General ADL Comments: Pt able to complete all LB dressing, grooming at sink, and ADL mobility with supv during evaluation. No LOB, pt demonstrating good safety awareness.      Pertinent Vitals/Pain Pain Assessment Pain Assessment: No/denies pain     Extremity/Trunk Assessment Upper Extremity Assessment Upper Extremity Assessment: Overall WFL for tasks assessed   Lower Extremity Assessment Lower Extremity Assessment: Overall WFL for tasks assessed   Cervical / Trunk Assessment Cervical / Trunk Assessment: Normal   Communication     Cognition Arousal: Alert Behavior During Therapy: WFL for tasks assessed/performed Cognition: No apparent impairments  Following commands: Intact          Exercises Other Exercises Other Exercises: Pt educated in energy conservation strategies to support his gradual return to PLOF in light of mild activity tolerance and balance impairments.        Home Living Family/patient expects to be discharged to:: Private residence Living Arrangements: Spouse/significant other Available Help at Discharge: Family Type of Home: House Home Access: Stairs  to  enter Entrance Stairs-Number of Steps: 3 from front entrance; 4 from back entrance Entrance Stairs-Rails: Right;Left;Can reach both Home Layout: One level     Bathroom Shower/Tub: Producer, television/film/video: Handicapped height     Home Equipment: Agricultural consultant (2 wheels);Cane - single point;BSC/3in1;Shower seat - built in;Hand held shower head;Wheelchair - manual;Grab bars - tub/shower          Prior Functioning/Environment Prior Level of Function : Independent/Modified Independent    Mobility Comments: recent use of rollator, previously independent ADLs Comments: MOD I/IND; reports sits/stands in shower    OT Problem List: Decreased activity tolerance;Impaired balance (sitting and/or standing)        OT Goals(Current goals can be found in the care plan section)   Acute Rehab OT Goals Patient Stated Goal: go home and continue [facility] therapies OT Goal Formulation: All assessment and education complete, DC therapy   AM-PAC OT "6 Clicks" Daily Activity     Outcome Measure Help from another person eating meals?: None Help from another person taking care of personal grooming?: None Help from another person toileting, which includes using toliet, bedpan, or urinal?: None Help from another person bathing (including washing, rinsing, drying)?: None Help from another person to put on and taking off regular upper body clothing?: None Help from another person to put on and taking off regular lower body clothing?: None 6 Click Score: 24   End of Session Equipment Utilized During Treatment: Rolling walker (2 wheels)  Activity Tolerance: Patient tolerated treatment well Patient left: in bed;with call bell/phone within reach  OT Visit Diagnosis: Unsteadiness on feet (R26.81)                Time: 1610-9604 OT Time Calculation (min): 19 min Charges:  OT General Charges $OT Visit: 1 Visit OT Evaluation $OT Eval Low Complexity: 1 Low OT Treatments $Self Care/Home Management :  8-22 mins  Arman Filter., MPH, MS, OTR/L ascom [phone] 11/21/23, 9:20 AM

## 2023-11-21 NOTE — Plan of Care (Signed)
  Problem: Education: Goal: Ability to describe self-care measures that may prevent or decrease complications (Diabetes Survival Skills Education) will improve Outcome: Progressing Goal: Individualized Educational Video(s) Outcome: Progressing   Problem: Coping: Goal: Ability to adjust to condition or change in health will improve Outcome: Progressing   Problem: Fluid Volume: Goal: Ability to maintain a balanced intake and output will improve Outcome: Progressing   Problem: Health Behavior/Discharge Planning: Goal: Ability to identify and utilize available resources and services will improve Outcome: Progressing Goal: Ability to manage health-related needs will improve Outcome: Progressing   Problem: Metabolic: Goal: Ability to maintain appropriate glucose levels will improve Outcome: Progressing   Problem: Nutritional: Goal: Maintenance of adequate nutrition will improve Outcome: Progressing Goal: Progress toward achieving an optimal weight will improve Outcome: Progressing   Problem: Skin Integrity: Goal: Risk for impaired skin integrity will decrease Outcome: Progressing   Problem: Tissue Perfusion: Goal: Adequacy of tissue perfusion will improve Outcome: Progressing   Problem: Education: Goal: Knowledge of General Education information will improve Description: Including pain rating scale, medication(s)/side effects and non-pharmacologic comfort measures Outcome: Progressing   Problem: Health Behavior/Discharge Planning: Goal: Ability to manage health-related needs will improve Outcome: Progressing   Problem: Clinical Measurements: Goal: Ability to maintain clinical measurements within normal limits will improve Outcome: Progressing Goal: Will remain free from infection Outcome: Progressing Goal: Diagnostic test results will improve Outcome: Progressing Goal: Respiratory complications will improve Outcome: Progressing Goal: Cardiovascular complication will  be avoided Outcome: Progressing   Problem: Activity: Goal: Risk for activity intolerance will decrease Outcome: Progressing   Problem: Nutrition: Goal: Adequate nutrition will be maintained Outcome: Progressing   Problem: Coping: Goal: Level of anxiety will decrease Outcome: Progressing   

## 2023-11-21 NOTE — TOC Transition Note (Addendum)
 Transition of Care [facility]) - Discharge Note   Patient Details  Name: Christian [name] MRN: [id number] Date of Birth: 06-Jan-1941  Transition of Care [facility]) CM/SW Contact:  Cherre Blanc, RN Phone Number: 11/21/2023, 10:54 AM   Clinical Narrative:    Patient lives with wife and is medically clear for dc to home with home health pt/ot. HH PT/OT setup with [facility]. The patient's wife will transport him home at dc. No other TOC needs identified.   Final next level of care: Home w Home Health Services Barriers to Discharge: Continued Medical Work up   Patient Goals and CMS Choice            Discharge Placement                       Discharge Plan and Services Additional resources added to the After Visit Summary for     Discharge Planning Services: CM Consult                      HH Arranged: PT, OT HH Agency: Other - See comment Buelah Manis) Date HH Agency Contacted: 11/21/23 Time HH Agency Contacted: 1054 Representative spoke with at [facility] Agency: [place]  Social Drivers of Health (SDOH) Interventions SDOH Screenings   Food Insecurity: No Food Insecurity (11/18/2023)  Housing: Low Risk  (11/18/2023)  Transportation Needs: No Transportation Needs (11/18/2023)  Utilities: Not At Risk (11/18/2023)  Depression (PHQ2-9): Low Risk  (10/11/2022)  Financial Resource Strain: Low Risk  (11/01/2023)   Received from [facility]  Physical Activity: Insufficiently Active (01/13/2020)   Received from [facility], [facility]  Social Connections: Socially Integrated (11/18/2023)  Stress: No Stress Concern Present (01/13/2020)   Received from [facility], [facility]  Tobacco Use: Low Risk  (11/18/2023)     Readmission Risk Interventions     No data to display

## 2023-11-21 NOTE — Discharge Summary (Signed)
 Physician Discharge Summary   Patient: Christian [name] MRN: [id number] DOB: 05-01-1941  Admit date:     11/18/2023  Discharge date: 11/21/23  Discharge Physician: Jonah Blue   PCP: Ethelda Chick, MD   Recommendations at discharge:   You are being discharged with home physical and occupational therapy Continue cephalexin until gone (total of 10 days) After cephalexin is gone, start nitrofurantoin at bedtime indefinitely (discuss with urology/PCP) Follow up with Dr. Katrinka Blazing in 1-2 weeks; her office will contact you with an appointment  Discharge Diagnoses: Principal Problem:   Complicated UTI (urinary tract infection) Active Problems:   Adrenal insufficiency due to cancer therapy (HCC)   Acute kidney injury superimposed on chronic kidney disease (HCC)   Coronary artery disease   Hypothyroidism   History of DVT (deep vein thrombosis)   A-fib (HCC)   Type II diabetes mellitus with renal manifestations (HCC)   Essential hypertension   History of bladder cancer   Hospital Course: 83yo with h/o metastatic bladder CA s/p urostomy, DM, afib on Eliquis, HTN, CAD, DM, adrenal insufficiency, stage 3a CKD, and hypothyroidism who presented on 3/25 with n/v.  He was diagnosed with a complicated UTI and started on Cefepime.  Also with AKI on baseline stage 3a CKD.  Culture grew E Coli and he was transitioned to PO antibiotics and home steroids resumed.  Assessment and Plan:  Complicated UTI (urinary tract infection) Patient reports 2 prior recent UTIs in February requiring hospitalization, although only one culture is reported on 2/21 and it was negative He did have a urine culture at PCP office on 3/11 that was + for >100k colonies E coli, resistant to Ampicillin, Cipro, Tetracycline Presented with n/v and abnormal UA and culture is growing similar E coli infection despite treatment since 3/17 with Augmentin No concern for sepsis Cefepime -> Cephalexin based on sensitivities for a total  of 10 days I have asked his PCP to reach out to urological oncology re: need for continuous prophylaxis following treatment to prevent need for rehospitalization (of note, he was on nitrofurantoin for prophylaxis while he was on chemo) Will start nitrofurantoin prophylaxis after completion of cephalexin; discuss ongoing treatment with urological oncology and/or PCP   Adrenal insufficiency due to cancer therapy  Blood pressure soft, currently 99/61 Held home Cortef -> stress dosed steroids Resumed home Cortef    AKI superimposed on stage 3a chronic kidney disease Recent baseline creatinine 1.38 on 10/19/2023 Presenting creatinine was 1.75 and GFR 39, improving with current creatinine 1.54, GFR 45 Repeat BMP this AM shows that renal function has returned to baseline   Coronary artery disease No chest pain Continue Lipitor   Hypothyroidism Continue Synthroid   History of DVT (deep vein thrombosis) Continue Eliquis   A-fib  Heart rate 100s --> 80s Continue Eliquis   Type II diabetes mellitus with renal manifestations  Recent A1c 8.6, poorly controlled Poor control while on stress-dosed steroids, resumed home steroids with improvement Continue 48 units nightly  Continue SSI with mealtime dosing Continue gabapentin   History of bladder cancer S/p of bladder resection with urostomy Stable disease as of office visit 3/14 Due for repeat scans q3 months  Chronic pain I have reviewed this patient in the [place] Controlled Substances Reporting System.  He is receiving medications from multiple providers but appears to be taking them as prescribed. He is not at particularly high risk of opioid misuse, diversion, or overdose.  Continue home gabapentin, Oxycodone       Consultants:  None   Procedures: None   Antibiotics: Cefepime 3/25-27 Augmentin 3/27-4/3   Pain control - [facility] Controlled Substance Reporting System database was reviewed. and patient was instructed, not  to drive, operate heavy machinery, perform activities at heights, swimming or participation in water activities or provide baby-sitting services while on Pain, Sleep and Anxiety Medications; until their outpatient Physician has advised to do so again. Also recommended to not to take more than prescribed Pain, Sleep and Anxiety Medications.    Disposition: Home Diet recommendation:  Carb modified diet DISCHARGE MEDICATION: Allergies as of 11/21/2023       Reactions   Ceftriaxone Hives   Tolerated cefazolin 09/2021 without reaction Received piperacillin/tazobactam in 2019 (after reported ceftriaxone allergy which was in 2018)   Other Hives   Sulfasalazine Hives   Sulfa Antibiotics Hives        Medication List     TAKE these medications    acetaminophen 325 MG tablet Commonly known as: TYLENOL Take 1-2 tablets (325-650 mg total) by mouth every 6 (six) hours as needed for mild pain (pain score 1-3), moderate pain (pain score 4-6), fever or headache (or Fever >/= 101).   apixaban 5 MG Tabs tablet Commonly known as: Eliquis Take 1 tablet (5 mg total) by mouth 2 (two) times daily.   atorvastatin 10 MG tablet Commonly known as: LIPITOR Take 1 tablet (10 mg total) by mouth daily at 6 PM.   cephALEXin 500 MG capsule Commonly known as: KEFLEX Take 1 capsule (500 mg total) by mouth every 12 (twelve) hours for 14 doses.   Dexcom G7 Receiver Hardie Pulley See admin instructions.   Dexcom G7 Sensor Misc USE 1 EACH EVERY 10 DAYS   fluticasone 50 MCG/ACT nasal spray Commonly known as: FLONASE Place 2 sprays into both nostrils as needed.   gabapentin 300 MG capsule Commonly known as: NEURONTIN Take 600 mg by mouth 2 (two) times daily.   hydrocortisone 10 MG tablet Commonly known as: CORTEF Take 20 mg in the morning and 10 mg at night   Insulin Aspart FlexPen 100 UNIT/ML Commonly known as: NOVOLOG Inject 25-40 units subcutaneously before meals three times daily as directed. Max Total  Daily Dose 130 units per day.   Lantus SoloStar 100 UNIT/ML Solostar Pen Generic drug: insulin glargine Inject 48 Units into the skin at bedtime.   levothyroxine 112 MCG tablet Commonly known as: SYNTHROID Take 112 mcg by mouth daily before breakfast.   lidocaine 5 % Commonly known as: LIDODERM Place onto the skin as directed.   nitrofurantoin (macrocrystal-monohydrate) 100 MG capsule Commonly known as: Macrobid Take 1 capsule (100 mg total) by mouth at bedtime. Start taking on: November 29, 2023   nitroGLYCERIN 0.4 MG SL tablet Commonly known as: NITROSTAT Place 1 tablet (0.4 mg total) under the tongue every 5 (five) minutes as needed for chest pain.   OneTouch Delica Plus Lancet33G Misc Apply 1 each topically 3 (three) times daily.   OneTouch Ultra test strip Generic drug: glucose blood 1 each (1 strip total) 3 (three) times daily Use as instructed. Dx code: E11.9        Follow-up Information     Ethelda Chick, MD Follow up.   Specialty: Family Medicine Why: Hospital follow up Contact information: [address] [place] [address] [phone]         [facility] Follow up.   Specialty: Home Health Services Why: They will call patient to schedule therapy appointments. Contact information: [address] [place] [address] [phone]                Discharge Exam:   Subjective: Feeling well today, no concerns, eager to go home.   Objective: Vitals:   11/21/23 0619 11/21/23 0729  BP: (!) 111/49 103/60  Pulse: (!) 58 (!) 57  Resp: 19 15  Temp: 98.2 F (36.8 C) 97.7 F (36.5 C)  SpO2: 97% 95%    Intake/Output Summary (Last 24 hours) at 11/21/2023 1244 Last data filed at 11/21/2023 0900 Gross per 24 hour  Intake 480 ml  Output 1475 ml  Net -995 ml   Filed Weights   11/18/23 1125  Weight: 108.9 kg    Exam:  General:  Appears calm and comfortable and is in NAD Eyes:   EOMI, normal lids, iris ENT:   grossly normal hearing, lips & tongue, mmm Cardiovascular:  RRR, no m/r/g. No LE edema.  Respiratory:   CTA bilaterally with no wheezes/rales/rhonchi.  Normal respiratory effort. Abdomen:  soft, NT, ND; + urostomy with clear urine output Skin:  no rash or induration seen on limited exam Musculoskeletal:  grossly normal tone BUE/BLE, good ROM, no bony abnormality Psychiatric:  grossly normal mood and affect, speech fluent and appropriate, AOx3 Neurologic:  CN 2-12 grossly intact, moves all extremities in coordinated fashion  Data Reviewed: I have reviewed the patient's lab results since admission.  Pertinent labs for today include:  BUN 29/Creatinine 1.3/GFR 56, stable WBC 7 Hgb 10.6, stable    Condition at discharge: stable  The results of significant diagnostics from this hospitalization (including imaging, microbiology, ancillary and laboratory) are listed below for reference.   Imaging Studies: No results found.  Microbiology: Results for orders placed or performed during the hospital encounter of 11/18/23  Blood culture (single)     Status: None (Preliminary result)   Collection Time: 11/18/23  5:16 PM   Specimen: Right Antecubital; Blood  Result Value Ref Range Status   Specimen Description RIGHT ANTECUBITAL  Final   Special Requests   Final    BOTTLES DRAWN AEROBIC AND ANAEROBIC Blood Culture results may not be optimal due to an inadequate volume of blood received in culture bottles   Culture   Final    NO GROWTH 3 DAYS Performed at [facility], [address] [place], [place] [address]    Report Status PENDING  Incomplete  Urine Culture     Status: Abnormal   Collection Time: 11/18/23  5:16 PM   Specimen: Urine, Random  Result Value Ref Range Status   Specimen Description   Final    URINE, RANDOM Performed at [facility], [address] [place], [place] [address]    Special Requests   Final    NONE Reflexed from [phone] [phone] Performed at  [facility], [address] [place], [place] [address]    Culture >=100,000 COLONIES/mL ESCHERICHIA COLI (A)  Final   Report Status 11/20/2023 FINAL  Final   Organism ID, Bacteria ESCHERICHIA COLI (A)  Final      Susceptibility   Escherichia coli - MIC*    AMPICILLIN >=32 RESISTANT Resistant     CEFAZOLIN <=4 SENSITIVE Sensitive     CEFEPIME <=0.12 SENSITIVE Sensitive     CEFTRIAXONE <=0.25 SENSITIVE Sensitive     CIPROFLOXACIN >=4 RESISTANT Resistant     GENTAMICIN <=1 SENSITIVE Sensitive     IMIPENEM <=0.25 SENSITIVE Sensitive     NITROFURANTOIN <=16 SENSITIVE Sensitive  TRIMETH/SULFA <=20 SENSITIVE Sensitive     AMPICILLIN/SULBACTAM 4 SENSITIVE Sensitive     PIP/TAZO <=4 SENSITIVE Sensitive ug/mL    * >=100,000 COLONIES/mL ESCHERICHIA COLI  Culture, blood (single)     Status: None (Preliminary result)   Collection Time: 11/18/23  6:09 PM   Specimen: BLOOD  Result Value Ref Range Status   Specimen Description BLOOD BLOOD RIGHT HAND  Final   Special Requests   Final    BOTTLES DRAWN AEROBIC AND ANAEROBIC Blood Culture results may not be optimal due to an inadequate volume of blood received in culture bottles   Culture   Final    NO GROWTH 3 DAYS Performed at [facility], [address] [place], [place] [address]    Report Status PENDING  Incomplete    Labs: CBC: Recent Labs  Lab 11/18/23 1128 11/19/23 0514 11/20/23 0427 11/21/23 0429  WBC 9.5 5.4 8.5 7.0  NEUTROABS  --   --  7.1 3.7  HGB 13.4 11.1* 10.3* 10.6*  HCT 39.6 32.3* 29.6* 30.3*  MCV 100.0 100.0 99.3 97.4  PLT 223 188 178 201   Basic Metabolic Panel: Recent Labs  Lab 11/18/23 1128 11/19/23 0514 11/19/23 2036 11/20/23 0427 11/21/23 0429  NA 134* 130*  --  134* 140  K 3.5 5.0  --  3.9 3.5  CL 100 101  --  101 107  CO2 24 20*  --  22 25  GLUCOSE 255* 337* 592* 371* 72  BUN 21 22  --  28* 29*  CREATININE 1.75* 1.62*  --  1.54* 1.30*  CALCIUM 9.1 8.6*  --  8.8* 8.8*    Liver Function Tests: Recent Labs  Lab 11/18/23 1128  AST 18  ALT 12  ALKPHOS 64  BILITOT 1.3*  PROT 7.8  ALBUMIN 3.9   CBG: Recent Labs  Lab 11/20/23 1205 11/20/23 1638 11/20/23 2039 11/21/23 0731 11/21/23 1151  GLUCAP 489* 444* 214* 84 335*    Discharge time spent: greater than 30 minutes.  Signed: Jonah Blue, MD [facility] 11/21/2023

## 2023-11-21 NOTE — Plan of Care (Signed)

## 2023-11-23 LAB — CULTURE, BLOOD (SINGLE)
Culture: NO GROWTH
Culture: NO GROWTH

## 2024-01-13 ENCOUNTER — Encounter (INDEPENDENT_AMBULATORY_CARE_PROVIDER_SITE_OTHER): Payer: Self-pay

## 2024-02-09 NOTE — Progress Notes (Signed)
 Cardiology Office Note  Date:  02/10/2024   ID:  Christian [name], DOB [birth year], MRN [id number]  PCP:  Valere Gata, MD   Cc: 3 hospital admissions  HPI:  Christian [name] is a 83 year old gentleman with a history of   obesity CAD,  anterior wall myocardial infarction and LAD angioplasty in April 1997 at [facility].  moderate disease in a diagonal vessel, RCA and proximal circumflex, obtuse marginal branch with normal systolic function, diastolic dysfunction  DVT and PE, after first cancer operation at [facility] Bladder surgery,  obstructive sleep apnea, hypertension,  hyperlipidemia,  small AAA,   DVT April 2019 Chronic UTIs Diagnosed with Bladder cancer metastasized to intra-abdominal lymph nodes  Retroperitoneal lymphadenopathy on pembrolizumab treatments  who presents for routine followup of his coronary artery disease, recurrent DVT, atrial fibrillation/flutter.  LOV 6/24  Numerous hospital admission 2/25: Hospital records reviewed 10/10/23: RSV, sepsis,  Acute metabolic encephalopathy PNA 10/17/23: hypotension, High anion gap metabolic acidosis ,  Uncontrolled type 1 diabetes mellitus with hyperglycemia , (+)RSV  Left ventricular ejection fraction, by estimation, is 65 to 70%.  11/18/23: Complicated UTI (urinary tract infection) ,  Adrenal insufficiency due to cancer therapy  Acute kidney injury superimposed on chronic kidney disease   CR 1.6 Getting strength back, walking Cancer free  EKG personally reviewed by myself on todays visit EKG Interpretation Date/Time:  Tuesday February 10 2024 09:35:37 EDT Ventricular Rate:  59 PR Interval:  158 QRS Duration:  102 QT Interval:  436 QTC Calculation: 431 R Axis:   31  Text Interpretation: Sinus bradycardia with sinus arrhythmia When compared with ECG of 17-Oct-2023 13:44, No significant change was found Confirmed by Christian [name] [phone]) on 02/10/2024 9:51:02 AM    In hospital 01/23/23: admission 8 days N/V + FTT picture.  Found to have adrenal insufficiency secondary  Loaded with steroids with taper over the past month During hospitalization had atrial fibrillation, converting to normal sinus rhythm  May 2024 A1c greater than 11  Lab work from August 2023 Total cholesterol 127 LDL 34  CT February 14, 2023 No new or enlarging metastatic disease in the chest abdomen pelvis Prior treatment with pembrolizumab, currently on hold given side effects  PT ruined my day, made me do 20 squats  Glucose sometimes running low , has arm sensor  EKG personally reviewed by myself on todays visit EKG Interpretation Date/Time:  Tuesday February 10 2024 09:35:37 EDT Ventricular Rate:  59 PR Interval:  158 QRS Duration:  102 QT Interval:  436 QTC Calculation: 431 R Axis:   31  Text Interpretation: Sinus bradycardia with sinus arrhythmia When compared with ECG of 17-Oct-2023 13:44, No significant change was found Confirmed by Christian [name] [phone]) on 02/10/2024 9:51:02 AM    Feb 2022 found on scan to have a 7x5 cm mass around the R adrenal glad with RP, celiac axis, and crural LAN with biopsy of the adrenal mass c/w mUC.   PET showed widespread nodal involvement and concern for osseous mets, although bone scan was negative.  started gemcitabine/cisplatin 12/13/20 and he completed C6 04/06/21.  Scans 04/24/21 showed continued response with mass 5.4x2.3 cm down from 7.3x5.2 cm on initial imaging with no evidence of LAN and stable pulm nodules that may or may not be metastatic disase.   Given response, maintained on  avelumab 800 mg every 2 weeks started on 05/14/21.  admitted 10/9-10/14/22 for sepsis from urinary source received 5 cycles of avelumab  Repeat scans show continued decrease in  size of adrenal mass (3.3x1.7 cm),   Having reaction to immunotherapy medication  Completed 11 cycles of avelumab, infusion reactions   Scans show continued slight decrease in size of adrenal mass and otherwise stable  disease. Started pembrolizuma every 6 weeks Has scans scheduled  Cardioversion 10/16/21 for fib/flutter, Noted to have rate 150 working with PT in hospital Recurrent E coli urospesis. Most recent admission in February 2023  Echo 2/23 NORMAL LEFT VENTRICULAR SYSTOLIC FUNCTION WITH MILD LVH   NORMAL RIGHT VENTRICULAR SYSTOLIC FUNCTION   VALVULAR REGURGITATION: TRIVIAL MR, MILD PR   NO VALVULAR STENOSIS   NO PRIOR STUDY FOR COMPARISON   Hospitalization March 2019 with UTI hematuria and sepsis Diagnosed with Escherichia coli bacteremia Positive blood cultures positive for ecoli and enterobacter  CT scan November 17, 2017 with no PE  12/02/2017 developed swelling and pain Ultrasound left popliteal vein posterior tibial vein peroneal veins with DVT  Hospitalized for DVT April 2019 Treated with eliquis  5 twice a day   ER 12/01/2016 Urine darker, Pain in ABD from coughing CT scan of his abdomen showing Kidney stone, 2 mm  Hematuria, Possible urinary tract infection, placed on Bactrim  10 days  Last stress test was in 03/2008. He had an inferior wall defect consistent with ischemia. Significant GI uptake artifact noted in the inferior wall. EF 42%. No cardiac cath was performed at that time in follow up.   ECHO in 02/2008 shows normal EF, otherwise normal study  PMH:   has a past medical history of Aortic aneurysm (HCC), Bladder cancer (HCC), Coronary artery disease, Diabetes mellitus without complication (HCC), DVT (deep venous thrombosis) (HCC) (10/07/2007), Glucose intolerance (impaired glucose tolerance), Hydrocele, Hyperlipidemia, Hypertension, MI (myocardial infarction) (HCC) (08/27/1995), Nephrolithiasis, and Sleep apnea.  PSH:    Past Surgical History:  Procedure Laterality Date   ANGIOPLASTY  1997   [place]   bladder removed     CARDIAC CATHETERIZATION     COLONOSCOPY  08/27/2011   normal.  [facility]   HYDROCELE EXCISION     PROSTATE SURGERY     prostatectomy with  bladder resection.    Current Outpatient Medications  Medication Sig Dispense Refill   acetaminophen  (TYLENOL ) 325 MG tablet Take 1-2 tablets (325-650 mg total) by mouth every 6 (six) hours as needed for mild pain (pain score 1-3), moderate pain (pain score 4-6), fever or headache (or Fever >/= 101).     apixaban  (ELIQUIS ) 5 MG TABS tablet Take 1 tablet (5 mg total) by mouth 2 (two) times daily. 180 tablet 3   atorvastatin  (LIPITOR) 10 MG tablet Take 1 tablet (10 mg total) by mouth daily at 6 PM. 90 tablet 3   Continuous Glucose Receiver (DEXCOM G7 RECEIVER) DEVI See admin instructions.     Continuous Glucose Sensor (DEXCOM G7 SENSOR) MISC USE 1 EACH EVERY 10 DAYS     fluticasone  (FLONASE ) 50 MCG/ACT nasal spray Place 2 sprays into both nostrils as needed.     gabapentin  (NEURONTIN ) 300 MG capsule Take 600 mg by mouth 2 (two) times daily.     hydrocortisone  (CORTEF ) 10 MG tablet Take 20 mg in the morning and 10 mg at night     Insulin  Aspart FlexPen (NOVOLOG ) 100 UNIT/ML Inject 25-40 units subcutaneously before meals three times daily as directed. Max Total Daily Dose 130 units per day.     Lancets (ONETOUCH DELICA PLUS LANCET33G) MISC Apply 1 each topically 3 (three) times daily.     LANTUS  SOLOSTAR 100 UNIT/ML Solostar  Pen Inject 48 Units into the skin at bedtime.     levothyroxine  (SYNTHROID ) 112 MCG tablet Take 112 mcg by mouth daily before breakfast.     lidocaine (LIDODERM) 5 % Place onto the skin as directed.     nitrofurantoin , macrocrystal-monohydrate, (MACROBID ) 100 MG capsule Take 1 capsule (100 mg total) by mouth at bedtime. 30 capsule 0   nitroGLYCERIN  (NITROSTAT ) 0.4 MG SL tablet Place 1 tablet (0.4 mg total) under the tongue every 5 (five) minutes as needed for chest pain. 25 tablet 6   ONETOUCH ULTRA test strip 1 each (1 strip total) 3 (three) times daily Use as instructed. Dx code: E11.9     No current facility-administered medications for this visit.     Allergies:    Ceftriaxone , Other, Sulfasalazine, and Sulfa  antibiotics   Social History:  The patient  reports that he has never smoked. He has never used smokeless tobacco. He reports that he does not drink alcohol and does not use drugs.   Family History:   family history includes Cancer in his father; Diabetes in his brother, mother, and sister; Lung cancer in his father; Stroke in his mother.   Review of Systems: Review of Systems  Constitutional: Negative.   HENT: Negative.    Respiratory: Negative.    Cardiovascular: Negative.   Gastrointestinal: Negative.   Musculoskeletal: Negative.   Neurological: Negative.   Psychiatric/Behavioral: Negative.    All other systems reviewed and are negative.  PHYSICAL EXAM: VS:  BP 124/73 (BP Location: Left Arm, Patient Position: Sitting, Cuff Size: Large)   Pulse 65   Resp 18   Ht 5' 8 (1.727 m)   Wt 252 lb (114.3 kg)   BMI 38.32 kg/m  , BMI Body mass index is 38.32 kg/m. Constitutional:  oriented to person, place, and time. No distress.  HENT:  Head: Grossly normal Eyes:  no discharge. No scleral icterus.  Neck: No JVD, no carotid bruits  Cardiovascular: Regular rate and rhythm, no murmurs appreciated Pulmonary/Chest: Clear to auscultation bilaterally, no wheezes or rales Abdominal: Soft.  no distension.  no tenderness.  Musculoskeletal: Normal range of motion Neurological:  normal muscle tone. Coordination normal. No atrophy Skin: Skin warm and dry Psychiatric: normal affect, pleasant  Recent Labs: 10/17/2023: B Natriuretic Peptide 150.0; Magnesium 1.8 11/18/2023: ALT 12 11/21/2023: BUN 29; Creatinine, Ser 1.30; Hemoglobin 10.6; Platelets 201; Potassium 3.5; Sodium 140   Lipid Panel Lab Results  Component Value Date   CHOL 127 02/23/2018   HDL 34 (L) 02/23/2018   LDLCALC 65 02/23/2018   TRIG 141 02/23/2018    Wt Readings from Last 3 Encounters:  02/10/24 252 lb (114.3 kg)  11/18/23 240 lb (108.9 kg)  10/18/23 249 lb 1.9 oz (113 kg)      ASSESSMENT AND PLAN:  Bladder cancer Tumor regression after treated with pembrolizumab ,  He reports that he is  tumor free  Orthostatic hypotension In the setting of adrenal insufficiency after treatment with pembrolizumab,  BP stable  Paroxysmal atrial fibrillation/flutter Despite 3 recent hospitalizations, RSV, sepsis, UTI, maintaining normal sinus rhythm On Eliquis  5 twice daily  Mixed hyperlipidemia  Goal LDL <70  DVT: History of recurrent episodes On anticoagulation, Eliquis   Atherosclerosis of native coronary artery of native heart with stable angina pectoris (HCC) -  Currently with no symptoms of angina. No further workup at this time. Continue current medication regimen. Prior stress test 09/2019  OBESITY-MORBID (>100') -  We have encouraged continued exercise, careful diet management in  an effort to lose weight.  Abdominal aortic aneurysm (AAA) without rupture (HCC) - Followed by vascular  Aortic atherosclerosis (HCC) CT scan reviewed showing moderate distal aortic and iliac arterial disease Coronary calcifications Non-smoker, cholesterol at goal   Orders Placed This Encounter  Procedures   EKG 12-Lead    Signed, Juanda Noon, M.D., Ph.D. 02/10/2024  [facility] [place], [place] [phone]

## 2024-02-10 ENCOUNTER — Encounter: Payer: Self-pay | Admitting: Cardiovascular Disease

## 2024-02-10 ENCOUNTER — Ambulatory Visit: Attending: Cardiovascular Disease | Admitting: Cardiovascular Disease

## 2024-02-10 VITALS — BP 124/73 | HR 65 | Resp 18 | Ht 68.0 in | Wt 252.0 lb

## 2024-02-10 DIAGNOSIS — I10 Essential (primary) hypertension: Secondary | ICD-10-CM

## 2024-02-10 NOTE — Patient Instructions (Signed)
 Medication Instructions:  ?No changes ? ?If you need a refill on your cardiac medications before your next appointment, please call your pharmacy.  ? ?Lab work: ?No new labs needed ? ?Testing/Procedures: ?No new testing needed ? ?Follow-Up: ?At [facility], you and your health needs are our priority.  As part of our continuing mission to provide you with exceptional heart care, we have created designated Provider Care Teams.  These Care Teams include your primary Cardiologist (physician) and Advanced Practice Providers (APPs -  Physician Assistants and Nurse Practitioners) who all work together to provide you with the care you need, when you need it. ? ?You will need a follow up appointment in 6 months, APP ok ? ?Providers on your designated Care Team:   ?Nicolasa Ducking, NP ?Eula Listen, PA-C ?Cadence Fransico Michael, PA-C ? ?COVID-19 Vaccine Information can be found at: [email or web address] For questions related to vaccine distribution or appointments, please email [email or web address] or call [phone].  ? ?

## 2024-07-27 ENCOUNTER — Ambulatory Visit (INDEPENDENT_AMBULATORY_CARE_PROVIDER_SITE_OTHER): Payer: Medicare HMO

## 2024-07-27 ENCOUNTER — Encounter (INDEPENDENT_AMBULATORY_CARE_PROVIDER_SITE_OTHER): Payer: Self-pay | Admitting: Vascular Surgery

## 2024-07-27 ENCOUNTER — Ambulatory Visit (INDEPENDENT_AMBULATORY_CARE_PROVIDER_SITE_OTHER): Payer: Medicare HMO | Admitting: Vascular Surgery

## 2024-07-27 VITALS — BP 125/80 | HR 86 | Resp 18 | Wt 236.6 lb

## 2024-07-27 DIAGNOSIS — I7143 Infrarenal abdominal aortic aneurysm, without rupture: Secondary | ICD-10-CM

## 2024-07-27 DIAGNOSIS — E785 Hyperlipidemia, unspecified: Secondary | ICD-10-CM | POA: Diagnosis not present

## 2024-07-27 DIAGNOSIS — C772 Secondary and unspecified malignant neoplasm of intra-abdominal lymph nodes: Secondary | ICD-10-CM

## 2024-07-27 DIAGNOSIS — I10 Essential (primary) hypertension: Secondary | ICD-10-CM | POA: Diagnosis not present

## 2024-07-27 DIAGNOSIS — C679 Malignant neoplasm of bladder, unspecified: Secondary | ICD-10-CM

## 2024-07-27 NOTE — Progress Notes (Signed)
 MRN : [id number]  Christian [name] is a 83 y.o. (1941-08-17) male who presents with chief complaint of  Chief Complaint  Patient presents with   Follow-up    65yr AAA follow up  .  History of Present Illness:   Discussed the use of AI scribe software for clinical note transcription with the patient, who gave verbal consent to proceed.  History of Present Illness Christian [name] is an 83 year old male with an abdominal aortic aneurysm who presents for routine follow-up.  He reports his abdominal aortic aneurysm has been followed for six to seven years. Recent imaging showed the diameter just under 3 cm, previously just over 3 cm, without interval symptoms. No aneurysm related symptoms. Specifically, the patient denies new back or abdominal pain, or signs of peripheral embolization  He has a permanent urostomy placed in 2005 and continues to use supplies for its ongoing management.  He notes significant intentional weight loss from 305 lb to 236 lb with gradual ongoing loss.  He mentions prior chemotherapy treatment without current treatment-related complaints relevant to today's visit.    Results RADIOLOGY Ultrasound: Slightly smaller than previous, measuring just under 3 cm, previously just over 3 cm (07/27/2024)  Current Outpatient Medications  Medication Sig Dispense Refill   acetaminophen  (TYLENOL ) 325 MG tablet Take 1-2 tablets (325-650 mg total) by mouth every 6 (six) hours as needed for mild pain (pain score 1-3), moderate pain (pain score 4-6), fever or headache (or Fever >/= 101).     apixaban  (ELIQUIS ) 5 MG TABS tablet Take 1 tablet (5 mg total) by mouth 2 (two) times daily. 180 tablet 3   atorvastatin  (LIPITOR) 10 MG tablet Take 1 tablet (10 mg total) by mouth daily at 6 PM. 90 tablet 3   Continuous Glucose Receiver (DEXCOM G7 RECEIVER) DEVI See admin instructions.     Continuous Glucose Sensor (DEXCOM G7 SENSOR) MISC USE 1 EACH EVERY 10 DAYS     fluticasone  (FLONASE )  50 MCG/ACT nasal spray Place 2 sprays into both nostrils as needed.     gabapentin  (NEURONTIN ) 300 MG capsule Take 600 mg by mouth 2 (two) times daily.     hydrocortisone  (CORTEF ) 10 MG tablet Take 20 mg in the morning and 10 mg at night     Insulin  Aspart FlexPen (NOVOLOG ) 100 UNIT/ML Inject 25-40 units subcutaneously before meals three times daily as directed. Max Total Daily Dose 130 units per day.     Lancets (ONETOUCH DELICA PLUS LANCET33G) MISC Apply 1 each topically 3 (three) times daily.     LANTUS  SOLOSTAR 100 UNIT/ML Solostar Pen Inject 48 Units into the skin at bedtime.     levothyroxine  (SYNTHROID ) 112 MCG tablet Take 112 mcg by mouth daily before breakfast.     lidocaine (LIDODERM) 5 % Place onto the skin as directed.     nitrofurantoin , macrocrystal-monohydrate, (MACROBID ) 100 MG capsule Take 1 capsule (100 mg total) by mouth at bedtime. 30 capsule 0   nitroGLYCERIN  (NITROSTAT ) 0.4 MG SL tablet Place 1 tablet (0.4 mg total) under the tongue every 5 (five) minutes as needed for chest pain. 25 tablet 6   ONETOUCH ULTRA test strip 1 each (1 strip total) 3 (three) times daily Use as instructed. Dx code: E11.9     No current facility-administered medications for this visit.    Past Medical History:  Diagnosis Date   Aortic aneurysm    followed by vascular surgery yearly.   Bladder cancer (HCC)  s/p bladder resection. [facility]   Coronary artery disease    AMI anterior wall 1997; s/p PTCA to LAD 11/1995 [facility].   Diabetes mellitus without complication (HCC)    DVT (deep venous thrombosis) (HCC) 10/07/2007   Glucose intolerance (impaired glucose tolerance)    Hydrocele    Hyperlipidemia    Hypertension    MI (myocardial infarction) (HCC) 08/27/1995   Nephrolithiasis    Sleep apnea     Past Surgical History:  Procedure Laterality Date   ANGIOPLASTY  1997   [place]   bladder removed     CARDIAC CATHETERIZATION     COLONOSCOPY  08/27/2011   normal.  [facility].    HYDROCELE EXCISION     PROSTATE SURGERY     prostatectomy with bladder resection.     Social History   Tobacco Use   Smoking status: Never   Smokeless tobacco: Never   Tobacco comments:    tobacco use- no   Vaping Use   Vaping status: Never Used  Substance Use Topics   Alcohol use: No   Drug use: No      Family History  Problem Relation Age of Onset   Stroke Mother    Diabetes Mother    Lung cancer Father    Cancer Father        lung cancer   Diabetes Sister    Diabetes Brother      Allergies  Allergen Reactions   Ceftriaxone  Hives    Tolerated cefazolin 09/2021 without reaction Received piperacillin /tazobactam in 2019 (after reported ceftriaxone  allergy which was in 2018)   Other Hives   Sulfasalazine Hives   Sulfa  Antibiotics Hives     REVIEW OF SYSTEMS (Negative unless checked)   Constitutional: [x] Weight loss  [] Fever  [] Chills Cardiac: [] Chest pain   [] Chest pressure   [] Palpitations   [] Shortness of breath when laying flat   [] Shortness of breath at rest   [] Shortness of breath with exertion. Vascular:  [] Pain in legs with walking   [] Pain in legs at rest   [] Pain in legs when laying flat   [] Claudication   [] Pain in feet when walking  [] Pain in feet at rest  [] Pain in feet when laying flat   [x] History of DVT   [] Phlebitis   [] Swelling in legs   [] Varicose veins   [] Non-healing ulcers Pulmonary:   [] Uses home oxygen   [] Productive cough   [] Hemoptysis   [] Wheeze  [] COPD   [] Asthma Neurologic:  [] Dizziness  [] Blackouts   [] Seizures   [] History of stroke   [] History of TIA  [] Aphasia   [] Temporary blindness   [] Dysphagia   [] Weakness or numbness in arms   [x] Weakness or numbness in legs Musculoskeletal:  [x] Arthritis   [] Joint swelling   [] Joint pain   [] Low back pain Hematologic:  [] Easy bruising  [] Easy bleeding   [] Hypercoagulable state   [] Anemic   Gastrointestinal:  [] Blood in stool   [] Vomiting blood  [] Gastroesophageal reflux/heartburn   [] Abdominal  pain Genitourinary:  [x] Chronic kidney disease   [] Difficult urination  [] Frequent urination  [] Burning with urination   [] Hematuria Skin:  [] Rashes   [] Ulcers   [] Wounds Psychological:  [] History of anxiety   []  History of major depression.  Physical Examination  BP 125/80 (BP Location: Left Arm)   Pulse 86   Resp 18   Wt 236 lb 9.6 oz (107.3 kg)   BMI 35.97 kg/m  Gen:  WD/WN, NAD Head: [place]/AT, No temporalis wasting. Ear/Nose/Throat: Hearing grossly intact,  nares w/o erythema or drainage Eyes: Conjunctiva clear. Sclera non-icteric Neck: Supple.  Trachea midline Pulmonary:  Good air movement, no use of accessory muscles.  Cardiac: RRR, no JVD Vascular:  Vessel Right Left  Radial Palpable Palpable                                   Gastrointestinal: soft, non-tender/non-distended. No guarding/reflex. Urostomy in place Musculoskeletal: M/S 5/5 throughout.  No deformity or atrophy. Trace LE edema. Neurologic: Sensation grossly intact in extremities.  Symmetrical.  Speech is fluent.  Psychiatric: Judgment intact, Mood & affect appropriate for pt's clinical situation. Dermatologic: No rashes or ulcers noted.  No cellulitis or open wounds.  Physical Exam MEASUREMENTS: Weight- 236.    Labs No results found for this or any previous visit (from the past 2160 hours).  Radiology No results found.  Assessment/Plan  Assessment & Plan Abdominal aortic aneurysm Stable for over six years, no significant growth, current measurement slightly under three centimeters. - Continue annual monitoring.  Hyperlipidemia lipid control important in reducing the progression of atherosclerotic disease. Continue statin therapy     Bladder cancer S/p resection by Urology and getting chemo and immune therapy   Essential hypertension blood pressure control important in reducing the progression of atherosclerotic disease and aneurysmal growth. On appropriate oral medications.   Selinda Gu, MD  07/27/2024 11:21 AM    This note was created with Dragon medical transcription system.  Any errors from dictation are purely unintentional

## 2024-09-05 ENCOUNTER — Other Ambulatory Visit: Payer: Self-pay

## 2024-09-05 ENCOUNTER — Inpatient Hospital Stay
Admission: EM | Admit: 2024-09-05 | Discharge: 2024-09-08 | DRG: 699 | Disposition: A | Discharge status: Home / Self Care | Attending: Internal Medicine | Admitting: Internal Medicine

## 2024-09-05 ENCOUNTER — Emergency Department

## 2024-09-05 ENCOUNTER — Encounter: Payer: Self-pay | Admitting: Emergency Medicine

## 2024-09-05 DIAGNOSIS — Z1629 Resistance to other single specified antibiotic: Secondary | ICD-10-CM | POA: Diagnosis present

## 2024-09-05 DIAGNOSIS — N1831 Chronic kidney disease, stage 3a: Secondary | ICD-10-CM | POA: Diagnosis present

## 2024-09-05 DIAGNOSIS — E273 Drug-induced adrenocortical insufficiency: Secondary | ICD-10-CM | POA: Diagnosis present

## 2024-09-05 DIAGNOSIS — Z86718 Personal history of other venous thrombosis and embolism: Secondary | ICD-10-CM

## 2024-09-05 DIAGNOSIS — I129 Hypertensive chronic kidney disease with stage 1 through stage 4 chronic kidney disease, or unspecified chronic kidney disease: Secondary | ICD-10-CM | POA: Diagnosis present

## 2024-09-05 DIAGNOSIS — Z833 Family history of diabetes mellitus: Secondary | ICD-10-CM | POA: Diagnosis not present

## 2024-09-05 DIAGNOSIS — I2510 Atherosclerotic heart disease of native coronary artery without angina pectoris: Secondary | ICD-10-CM | POA: Diagnosis present

## 2024-09-05 DIAGNOSIS — I959 Hypotension, unspecified: Secondary | ICD-10-CM | POA: Diagnosis present

## 2024-09-05 DIAGNOSIS — Z1152 Encounter for screening for COVID-19: Secondary | ICD-10-CM

## 2024-09-05 DIAGNOSIS — Z801 Family history of malignant neoplasm of trachea, bronchus and lung: Secondary | ICD-10-CM

## 2024-09-05 DIAGNOSIS — B9620 Unspecified Escherichia coli [E. coli] as the cause of diseases classified elsewhere: Secondary | ICD-10-CM | POA: Diagnosis present

## 2024-09-05 DIAGNOSIS — E66811 Obesity, class 1: Secondary | ICD-10-CM | POA: Diagnosis present

## 2024-09-05 DIAGNOSIS — Z8551 Personal history of malignant neoplasm of bladder: Secondary | ICD-10-CM

## 2024-09-05 DIAGNOSIS — Z794 Long term (current) use of insulin: Secondary | ICD-10-CM

## 2024-09-05 DIAGNOSIS — I252 Old myocardial infarction: Secondary | ICD-10-CM

## 2024-09-05 DIAGNOSIS — N10 Acute tubulo-interstitial nephritis: Principal | ICD-10-CM | POA: Diagnosis present

## 2024-09-05 DIAGNOSIS — Z79890 Hormone replacement therapy (postmenopausal): Secondary | ICD-10-CM

## 2024-09-05 DIAGNOSIS — E785 Hyperlipidemia, unspecified: Secondary | ICD-10-CM | POA: Diagnosis present

## 2024-09-05 DIAGNOSIS — N390 Urinary tract infection, site not specified: Secondary | ICD-10-CM | POA: Diagnosis present

## 2024-09-05 DIAGNOSIS — R55 Syncope and collapse: Secondary | ICD-10-CM | POA: Diagnosis present

## 2024-09-05 DIAGNOSIS — Z9861 Coronary angioplasty status: Secondary | ICD-10-CM

## 2024-09-05 DIAGNOSIS — E860 Dehydration: Secondary | ICD-10-CM | POA: Diagnosis present

## 2024-09-05 DIAGNOSIS — I482 Chronic atrial fibrillation, unspecified: Secondary | ICD-10-CM | POA: Diagnosis present

## 2024-09-05 DIAGNOSIS — Z7901 Long term (current) use of anticoagulants: Secondary | ICD-10-CM

## 2024-09-05 DIAGNOSIS — Z79899 Other long term (current) drug therapy: Secondary | ICD-10-CM | POA: Diagnosis not present

## 2024-09-05 DIAGNOSIS — C679 Malignant neoplasm of bladder, unspecified: Secondary | ICD-10-CM | POA: Diagnosis present

## 2024-09-05 DIAGNOSIS — E039 Hypothyroidism, unspecified: Secondary | ICD-10-CM | POA: Diagnosis present

## 2024-09-05 DIAGNOSIS — Z6834 Body mass index (BMI) 34.0-34.9, adult: Secondary | ICD-10-CM | POA: Diagnosis not present

## 2024-09-05 DIAGNOSIS — T451X5A Adverse effect of antineoplastic and immunosuppressive drugs, initial encounter: Secondary | ICD-10-CM | POA: Diagnosis present

## 2024-09-05 DIAGNOSIS — E1129 Type 2 diabetes mellitus with other diabetic kidney complication: Secondary | ICD-10-CM | POA: Diagnosis present

## 2024-09-05 DIAGNOSIS — Z881 Allergy status to other antibiotic agents status: Secondary | ICD-10-CM

## 2024-09-05 DIAGNOSIS — C772 Secondary and unspecified malignant neoplasm of intra-abdominal lymph nodes: Secondary | ICD-10-CM | POA: Diagnosis present

## 2024-09-05 DIAGNOSIS — Z823 Family history of stroke: Secondary | ICD-10-CM

## 2024-09-05 DIAGNOSIS — E669 Obesity, unspecified: Secondary | ICD-10-CM | POA: Diagnosis not present

## 2024-09-05 DIAGNOSIS — E1122 Type 2 diabetes mellitus with diabetic chronic kidney disease: Secondary | ICD-10-CM | POA: Diagnosis present

## 2024-09-05 DIAGNOSIS — A419 Sepsis, unspecified organism: Secondary | ICD-10-CM

## 2024-09-05 DIAGNOSIS — Z882 Allergy status to sulfonamides status: Secondary | ICD-10-CM

## 2024-09-05 DIAGNOSIS — Z87442 Personal history of urinary calculi: Secondary | ICD-10-CM

## 2024-09-05 DIAGNOSIS — T83598A Infection and inflammatory reaction due to other prosthetic device, implant and graft in urinary system, initial encounter: Principal | ICD-10-CM | POA: Diagnosis present

## 2024-09-05 LAB — COMPREHENSIVE METABOLIC PANEL WITH GFR
ALT: 6 U/L (ref 0–44)
AST: 14 U/L — ABNORMAL LOW (ref 15–41)
Albumin: 4 g/dL (ref 3.5–5.0)
Alkaline Phosphatase: 80 U/L (ref 38–126)
Anion gap: 10 (ref 5–15)
BUN: 15 mg/dL (ref 8–23)
CO2: 27 mmol/L (ref 22–32)
Calcium: 9.3 mg/dL (ref 8.9–10.3)
Chloride: 99 mmol/L (ref 98–111)
Creatinine, Ser: 1.43 mg/dL — ABNORMAL HIGH (ref 0.61–1.24)
GFR, Estimated: 49 mL/min — ABNORMAL LOW
Glucose, Bld: 65 mg/dL — ABNORMAL LOW (ref 70–99)
Potassium: 3.7 mmol/L (ref 3.5–5.1)
Sodium: 136 mmol/L (ref 135–145)
Total Bilirubin: 0.6 mg/dL (ref 0.0–1.2)
Total Protein: 7.5 g/dL (ref 6.5–8.1)

## 2024-09-05 LAB — MAGNESIUM: Magnesium: 1.9 mg/dL (ref 1.7–2.4)

## 2024-09-05 LAB — URINALYSIS, W/ REFLEX TO CULTURE (INFECTION SUSPECTED)
Bilirubin Urine: NEGATIVE
Glucose, UA: NEGATIVE mg/dL
Ketones, ur: NEGATIVE mg/dL
Nitrite: POSITIVE — AB
Protein, ur: NEGATIVE mg/dL
Specific Gravity, Urine: 1.009 (ref 1.005–1.030)
Squamous Epithelial / HPF: 0 /HPF (ref 0–5)
pH: 6 (ref 5.0–8.0)

## 2024-09-05 LAB — CBC WITH DIFFERENTIAL/PLATELET
Abs Immature Granulocytes: 0.04 K/uL (ref 0.00–0.07)
Basophils Absolute: 0.1 K/uL (ref 0.0–0.1)
Basophils Relative: 1 %
Eosinophils Absolute: 0.3 K/uL (ref 0.0–0.5)
Eosinophils Relative: 3 %
HCT: 37 % — ABNORMAL LOW (ref 39.0–52.0)
Hemoglobin: 12.4 g/dL — ABNORMAL LOW (ref 13.0–17.0)
Immature Granulocytes: 0 %
Lymphocytes Relative: 22 %
Lymphs Abs: 2.1 K/uL (ref 0.7–4.0)
MCH: 33.2 pg (ref 26.0–34.0)
MCHC: 33.5 g/dL (ref 30.0–36.0)
MCV: 99.2 fL (ref 80.0–100.0)
Monocytes Absolute: 1.3 K/uL — ABNORMAL HIGH (ref 0.1–1.0)
Monocytes Relative: 14 %
Neutro Abs: 5.8 K/uL (ref 1.7–7.7)
Neutrophils Relative %: 60 %
Platelets: 208 K/uL (ref 150–400)
RBC: 3.73 MIL/uL — ABNORMAL LOW (ref 4.22–5.81)
RDW: 13.1 % (ref 11.5–15.5)
WBC: 9.7 K/uL (ref 4.0–10.5)
nRBC: 0 % (ref 0.0–0.2)

## 2024-09-05 LAB — RESP PANEL BY RT-PCR (RSV, FLU A&B, COVID)  RVPGX2
Influenza A by PCR: NEGATIVE
Influenza B by PCR: NEGATIVE
Resp Syncytial Virus by PCR: NEGATIVE
SARS Coronavirus 2 by RT PCR: NEGATIVE

## 2024-09-05 LAB — PHOSPHORUS: Phosphorus: 3.2 mg/dL (ref 2.5–4.6)

## 2024-09-05 LAB — CBG MONITORING, ED: Glucose-Capillary: 342 mg/dL — ABNORMAL HIGH (ref 70–99)

## 2024-09-05 LAB — LACTIC ACID, PLASMA
Lactic Acid, Venous: 2.2 mmol/L (ref 0.5–1.9)
Lactic Acid, Venous: 1.3 mmol/L (ref 0.5–1.9)

## 2024-09-05 MED ORDER — SODIUM CHLORIDE 0.9 % IV SOLN: INTRAVENOUS | Status: DC

## 2024-09-05 MED ORDER — INSULIN ASPART 100 UNIT/ML IJ SOLN
0.0000 [IU] | Freq: Three times a day (TID) | INTRAMUSCULAR | Status: DC
  Administered 2024-09-06: 7 [IU] via SUBCUTANEOUS

## 2024-09-05 MED ORDER — SODIUM CHLORIDE 0.9 % IV SOLN
2.0000 g | Freq: Once | INTRAVENOUS | Status: AC
  Administered 2024-09-05: 2 g via INTRAVENOUS
  Filled 2024-09-05: qty 10

## 2024-09-05 MED ORDER — SODIUM CHLORIDE 0.9 % IV BOLUS
1000.0000 mL | Freq: Once | INTRAVENOUS | Status: AC
  Administered 2024-09-05: 1000 mL via INTRAVENOUS

## 2024-09-05 MED ORDER — DIPHENHYDRAMINE HCL 50 MG/ML IJ SOLN: 12.5000 mg | Freq: Three times a day (TID) | INTRAMUSCULAR | Status: DC | PRN

## 2024-09-05 MED ORDER — HYDROCORTISONE SOD SUC (PF) 100 MG IJ SOLR
50.0000 mg | INTRAMUSCULAR | Status: AC
  Administered 2024-09-05: 50 mg via INTRAVENOUS
  Filled 2024-09-05: qty 1

## 2024-09-05 MED ORDER — DEXTROSE 50 % IV SOLN: 50.0000 mL | INTRAVENOUS | Status: DC | PRN

## 2024-09-05 MED ORDER — INSULIN ASPART 100 UNIT/ML IJ SOLN
0.0000 [IU] | Freq: Every day | INTRAMUSCULAR | Status: DC
  Administered 2024-09-06: 5 [IU] via SUBCUTANEOUS
  Filled 2024-09-05: qty 5

## 2024-09-05 MED ORDER — SODIUM CHLORIDE 0.9 % IV BOLUS
500.0000 mL | Freq: Once | INTRAVENOUS | Status: AC
  Administered 2024-09-06: 500 mL via INTRAVENOUS

## 2024-09-05 MED ORDER — INSULIN GLARGINE 100 UNIT/ML ~~LOC~~ SOLN
38.0000 [IU] | Freq: Every day | SUBCUTANEOUS | Status: DC
  Administered 2024-09-06: 38 [IU] via SUBCUTANEOUS
  Filled 2024-09-05: qty 0.38

## 2024-09-05 MED ORDER — ACETAMINOPHEN 325 MG PO TABS: 650.0000 mg | ORAL_TABLET | Freq: Four times a day (QID) | ORAL | Status: DC | PRN

## 2024-09-05 MED ORDER — HYDROCORTISONE SOD SUC (PF) 100 MG IJ SOLR
100.0000 mg | Freq: Three times a day (TID) | INTRAMUSCULAR | Status: DC
  Administered 2024-09-06 (×2): 100 mg via INTRAVENOUS
  Filled 2024-09-05 (×2): qty 2

## 2024-09-05 NOTE — ED Notes (Signed)
 EDP notified that pt is requesting home insulin  dosage of [place].

## 2024-09-05 NOTE — ED Notes (Signed)
 Admitting MD at bedside.

## 2024-09-05 NOTE — H&P (Incomplete)
 " History and Physical    Christian [name] FMW:[id number] DOB: 26-Mar-1941 DOA: 09/05/2024  Referring MD/NP/PA:   PCP: Claudene Rayfield HERO, MD   Patient coming from:  The patient is coming from home.     Chief Complaint: Generalized weakness, dizziness  HPI: Christian [name] is a 84 y.o. male with medical history significant of bladder cancer of bladder cancer metastasized to intra abdominal lymph nodes (post cystectomy and ileal conduit urostomy), DM, DVT and A fib on Eliquis , CAD, HTN, DM,  adrenal insufficiency due to cancer therapy, CKD 3a, hypothyroidism, who presents with generalized weakness, dizziness, syncope.  Per patient and his wife at his bedside, he started having generalized weakness and dizziness since yesterday evening.  He has nausea and few times of nonbilious nonbloody vomiting.  No diarrhea or abdominal pain.  No fever or chills.  He states that his dizziness is worse when standing up. Per his wife, when EMS tried to help him stand up, patient passed out for few seconds.  His wife lowered him down to the floor without any injury.  Patient strongly denies any head or neck injury.  Does not want to CT scan of the head.  No unilateral numbness or tingling in extremities.  No facial droop or slurred speech.  Moves all extremities normally.   Per EMS report, patient was hypotensive with blood pressure 70s/40s which improved after giving 1 L IV fluid bolus.  His blood pressure is 111/65 in ED.  Patient denies chest pain, cough, SOB.  No fever or chills.  Data reviewed independently and ED Course: pt was found to have WBC 9.7, UA (hazy appearance, small amount of leukocyte, positive nitrite, few bacteria, WBC 11-20), negative PCR for COVID, flu and RSV, renal function close to baseline, lactic acid 2.2 --> 1.3.  Chest x-ray negative for infiltration.  Temperature normal, heart rate 99, RR 22, oxygen saturation 97% on room air.  Chest x-ray negative for infiltration.  Patient is admitted to  telemetry bed as inpatient.   EKG: I have personally reviewed.  A-fib, QTc 594, early R wave progression.  Repeated EKG showed QTc 479.   Review of Systems:   General: no fevers, chills, no body weight gain, has poor appetite, has fatigue HEENT: no blurry vision, hearing changes or sore throat Respiratory: no dyspnea, coughing, wheezing CV: no chest pain, no palpitations GI: has nausea, vomiting, no abdominal pain, diarrhea, constipation GU: no hematuria  Ext: no leg edema Neuro: no unilateral weakness, numbness, or tingling, no vision change or hearing loss. Has syncope and lightheadedness. Skin: no rash, no skin tear. MSK: No muscle spasm, no deformity, no limitation of range of movement in spin Heme: No easy bruising.  Travel history: No recent long distant travel.   Allergy: Allergies[1]  Past Medical History:  Diagnosis Date   Aortic aneurysm    followed by vascular surgery yearly.   Bladder cancer [facility])    s/p bladder resection. [facility]   Coronary artery disease    AMI anterior wall 1997; s/p PTCA to LAD 11/1995 [facility].   Diabetes mellitus without complication (HCC)    DVT (deep venous thrombosis) (HCC) 10/07/2007   Glucose intolerance (impaired glucose tolerance)    Hydrocele    Hyperlipidemia    Hypertension    MI (myocardial infarction) (HCC) 08/27/1995   Nephrolithiasis    Sleep apnea     Past Surgical History:  Procedure Laterality Date   ANGIOPLASTY  1997   [place]   bladder  removed     CARDIAC CATHETERIZATION     COLONOSCOPY  08/27/2011   normal.  [facility].   HYDROCELE EXCISION     PROSTATE SURGERY     prostatectomy with bladder resection.    Social History:  reports that he has never smoked. He has never used smokeless tobacco. He reports that he does not drink alcohol and does not use drugs.  Family History:  Family History  Problem Relation Age of Onset   Stroke Mother    Diabetes Mother    Lung cancer Father     Cancer Father        lung cancer   Diabetes Sister    Diabetes Brother      Prior to Admission medications  Medication Sig Start Date End Date Taking? Authorizing Provider  acetaminophen  (TYLENOL ) 325 MG tablet Take 1-2 tablets (325-650 mg total) by mouth every 6 (six) hours as needed for mild pain (pain score 1-3), moderate pain (pain score 4-6), fever or headache (or Fever >/= 101). 10/20/23   Marsa Edelman, DO  apixaban  (ELIQUIS ) 5 MG TABS tablet Take 1 tablet (5 mg total) by mouth 2 (two) times daily. 08/17/18   Gollan, Timothy J, MD  atorvastatin  (LIPITOR) 10 MG tablet Take 1 tablet (10 mg total) by mouth daily at 6 PM. 02/23/18   Claudene Rayfield HERO, MD  Continuous Glucose Receiver (DEXCOM G7 RECEIVER) DEVI See admin instructions.    [provider]  Continuous Glucose Sensor (DEXCOM G7 SENSOR) MISC USE 1 EACH EVERY 10 DAYS    [provider]  fluticasone  (FLONASE ) 50 MCG/ACT nasal spray Place 2 sprays into both nostrils as needed. 04/19/19   [provider]  gabapentin  (NEURONTIN ) 300 MG capsule Take 600 mg by mouth 2 (two) times daily. 07/23/21   [provider]  hydrocortisone  (CORTEF ) 10 MG tablet Take 20 mg in the morning and 10 mg at night 02/20/23   [provider]  Insulin  Aspart FlexPen (NOVOLOG ) 100 UNIT/ML Inject 25-40 units subcutaneously before meals three times daily as directed. Max Total Daily Dose 130 units per day. 02/17/23   [provider]  Lancets (ONETOUCH DELICA PLUS [place]) MISC Apply 1 each topically 3 (three) times daily. 09/30/22   [provider]  LANTUS  SOLOSTAR 100 UNIT/ML Solostar Pen Inject 48 Units into the skin at bedtime. 10/07/22   [provider]  levothyroxine  (SYNTHROID ) 112 MCG tablet Take 112 mcg by mouth daily before breakfast. 02/20/23 07/27/24  [provider]  lidocaine (LIDODERM) 5 % Place onto the skin as directed. 09/21/23   [provider]   nitrofurantoin , macrocrystal-monohydrate, (MACROBID ) 100 MG capsule Take 1 capsule (100 mg total) by mouth at bedtime. 11/29/23   Barbarann Nest, MD  nitroGLYCERIN  (NITROSTAT ) 0.4 MG SL tablet Place 1 tablet (0.4 mg total) under the tongue every 5 (five) minutes as needed for chest pain. 02/23/18   Claudene Rayfield HERO, MD  [facility] ULTRA test strip 1 each (1 strip total) 3 (three) times daily Use as instructed. Dx code: E11.9 09/30/22   [provider]    Physical Exam: Vitals:   09/05/24 1825 09/05/24 1830 09/05/24 2015 09/05/24 2115  BP: 116/61 111/65    Pulse: 79 99 89 85  Resp: (!) 22 (!) 22 18 20   Temp:      SpO2: 96% 99% 96% 97%  Weight:      Height:       General: Not in acute distress HEENT:  Eyes: PERRL, EOMI, no jaundice       ENT: No discharge from the ears and nose, no pharynx injection, no tonsillar enlargement.        Neck: No JVD, no bruit, no mass felt. Heme: No neck lymph node enlargement. Cardiac: S1/S2, RRR, No murmurs, No gallops or rubs. Respiratory: No rales, wheezing, rhonchi or rubs. GI: Soft, nondistended, nontender, no rebound pain, no organomegaly, BS present. S/p of urostomy with clean surroundings.  There are some debris in the bag GU: No hematuria Ext: No pitting leg edema bilaterally. 1+DP/PT pulse bilaterally. Musculoskeletal: No joint deformities, No joint redness or warmth, no limitation of ROM in spin. Skin: No rashes.  Neuro: Alert, oriented X3, cranial nerves II-XII grossly intact, moves all extremities normally.  Psych: Patient is not psychotic, no suicidal or hemocidal ideation.  Labs on Admission: I have personally reviewed following labs and imaging studies  CBC: Recent Labs  Lab 09/05/24 1642  WBC 9.7  NEUTROABS 5.8  HGB 12.4*  HCT 37.0*  MCV 99.2  PLT 208   Basic Metabolic Panel: Recent Labs  Lab 09/05/24 1642  NA 136  K 3.7  CL 99  CO2 27  GLUCOSE 65*  BUN 15  CREATININE 1.43*  CALCIUM  9.3  MG 1.9  PHOS 3.2    GFR: Estimated Creatinine Clearance: 50.4 mL/min (A) (by C-G formula based on SCr of 1.43 mg/dL (H)). Liver Function Tests: Recent Labs  Lab 09/05/24 1642  AST 14*  ALT 6  ALKPHOS 80  BILITOT 0.6  PROT 7.5  ALBUMIN 4.0   No results for input(s): LIPASE, AMYLASE in the last 168 hours. No results for input(s): AMMONIA in the last 168 hours. Coagulation Profile: No results for input(s): INR, PROTIME in the last 168 hours. Cardiac Enzymes: No results for input(s): CKTOTAL, CKMB, CKMBINDEX, TROPONINI in the last 168 hours. BNP (last 3 results) No results for input(s): PROBNP in the last 8760 hours. HbA1C: No results for input(s): HGBA1C in the last 72 hours. CBG: Recent Labs  Lab 09/05/24 2313  GLUCAP 342*   Lipid Profile: No results for input(s): CHOL, HDL, LDLCALC, TRIG, CHOLHDL, LDLDIRECT in the last 72 hours. Thyroid  Function Tests: No results for input(s): TSH, T4TOTAL, FREET4, T3FREE, THYROIDAB in the last 72 hours. Anemia Panel: No results for input(s): VITAMINB12, FOLATE, FERRITIN, TIBC, IRON, RETICCTPCT in the last 72 hours. Urine analysis:    Component Value Date/Time   COLORURINE YELLOW (A) 09/05/2024 1821   APPEARANCEUR HAZY (A) 09/05/2024 1821   LABSPEC 1.009 09/05/2024 1821   PHURINE 6.0 09/05/2024 1821   GLUCOSEU NEGATIVE 09/05/2024 1821   HGBUR SMALL (A) 09/05/2024 1821   BILIRUBINUR NEGATIVE 09/05/2024 1821   BILIRUBINUR negative 02/23/2018 1048   BILIRUBINUR neg 03/01/2015 1132   KETONESUR NEGATIVE 09/05/2024 1821   PROTEINUR NEGATIVE 09/05/2024 1821   UROBILINOGEN 0.2 02/23/2018 1048   NITRITE POSITIVE (A) 09/05/2024 1821   LEUKOCYTESUR SMALL (A) 09/05/2024 1821   Sepsis Labs: @LABRCNTIP (procalcitonin:4,lacticidven:4) ) Recent Results (from the past 240 hours)  Resp panel by RT-PCR (RSV, Flu A&B, Covid) Anterior Nasal Swab     Status: None   Collection Time: 09/05/24  4:41 PM    Specimen: Anterior Nasal Swab  Result Value Ref Range Status   SARS Coronavirus 2 by RT PCR NEGATIVE NEGATIVE Final    Comment: (NOTE) SARS-CoV-2 target nucleic acids are NOT DETECTED.  The SARS-CoV-2 RNA is generally detectable in upper respiratory specimens during the acute phase of infection. The lowest concentration  of SARS-CoV-2 viral copies this assay can detect is 138 copies/mL. A negative result does not preclude SARS-Cov-2 infection and should not be used as the sole basis for treatment or other patient management decisions. A negative result may occur with  improper specimen collection/handling, submission of specimen other than nasopharyngeal swab, presence of viral mutation(s) within the areas targeted by this assay, and inadequate number of viral copies(<138 copies/mL). A negative result must be combined with clinical observations, patient history, and epidemiological information. The expected result is Negative.  Fact Sheet for Patients:  bloggercourse.com  Fact Sheet for Healthcare Providers:  seriousbroker.it  This test is no t yet approved or cleared by the [place]  FDA and  has been authorized for detection and/or diagnosis of SARS-CoV-2 by FDA under an Emergency Use Authorization (EUA). This EUA will remain  in effect (meaning this test can be used) for the duration of the COVID-19 declaration under Section 564(b)(1) of the Act, 21 U.S.C.section 360bbb-3(b)(1), unless the authorization is terminated  or revoked sooner.       Influenza A by PCR NEGATIVE NEGATIVE Final   Influenza B by PCR NEGATIVE NEGATIVE Final    Comment: (NOTE) The Xpert Xpress SARS-CoV-2/FLU/RSV plus assay is intended as an aid in the diagnosis of influenza from Nasopharyngeal swab specimens and should not be used as a sole basis for treatment. Nasal washings and aspirates are unacceptable for Xpert Xpress  SARS-CoV-2/FLU/RSV testing.  Fact Sheet for Patients: bloggercourse.com  Fact Sheet for Healthcare Providers: seriousbroker.it  This test is not yet approved or cleared by the [place]  FDA and has been authorized for detection and/or diagnosis of SARS-CoV-2 by FDA under an Emergency Use Authorization (EUA). This EUA will remain in effect (meaning this test can be used) for the duration of the COVID-19 declaration under Section 564(b)(1) of the Act, 21 U.S.C. section 360bbb-3(b)(1), unless the authorization is terminated or revoked.     Resp Syncytial Virus by PCR NEGATIVE NEGATIVE Final    Comment: (NOTE) Fact Sheet for Patients: bloggercourse.com  Fact Sheet for Healthcare Providers: seriousbroker.it  This test is not yet approved or cleared by the [place]  FDA and has been authorized for detection and/or diagnosis of SARS-CoV-2 by FDA under an Emergency Use Authorization (EUA). This EUA will remain in effect (meaning this test can be used) for the duration of the COVID-19 declaration under Section 564(b)(1) of the Act, 21 U.S.C. section 360bbb-3(b)(1), unless the authorization is terminated or revoked.  Performed at [facility], [address], [place], [place] [address]      Radiological Exams on Admission:   Assessment/Plan Principal Problem:   Complicated UTI (urinary tract infection) Active Problems:   Adrenal insufficiency due to cancer therapy   Hypotension   Syncope   Coronary artery disease   Hypothyroidism   Type II diabetes mellitus with renal manifestations (HCC)   Bladder cancer metastasized to intra-abdominal lymph nodes (HCC)   History of DVT (deep vein thrombosis)   Chronic atrial fibrillation (HCC)   Chronic kidney disease, stage 3a (HCC)   Obesity (BMI 30-39.9)   Assessment and Plan:   Complicated UTI (urinary tract  infection):      Adrenal insufficiency due to cancer therapy   Hypotension   Syncope   Coronary artery disease   Hypothyroidism   Type II diabetes mellitus with renal manifestations (HCC)   Bladder cancer metastasized to intra-abdominal lymph nodes (HCC)   History of DVT (deep vein thrombosis)   Chronic atrial fibrillation (  HCC)   Chronic kidney disease, stage 3a (HCC)   Obesity (BMI 30-39.9)        DVT ppx: on Eliquis   Code Status: Full code   Family Communication:  Yes, patient's wife at bed side.     Disposition Plan:  Anticipate discharge back to previous environment  Consults called:  none  Admission status and Level of care: Telemetry:  as inpt        Dispo: The patient is from: Home              Anticipated d/c is to: Home              Anticipated d/c date is: 2 days              Patient currently is not medically stable to d/c.    Severity of Illness:  The appropriate patient status for this patient is INPATIENT. Inpatient status is judged to be reasonable and necessary in order to provide the required intensity of service to ensure the patient's safety. The patient's presenting symptoms, physical exam findings, and initial radiographic and laboratory data in the context of their chronic comorbidities is felt to place them at high risk for further clinical deterioration. Furthermore, it is not anticipated that the patient will be medically stable for discharge from the hospital within 2 midnights of admission.   * I certify that at the point of admission it is my clinical judgment that the patient will require inpatient hospital care spanning beyond 2 midnights from the point of admission due to high intensity of service, high risk for further deterioration and high frequency of surveillance required.*       Date of Service 09/06/2024    Caleb Exon [facility]   If 7PM-7AM, please contact night-coverage [email or web address] 09/06/2024, 12:02  AM       [1] Allergies Allergen Reactions   Ceftriaxone  Hives    Tolerated cefazolin 09/2021 without reaction  Received piperacillin /tazobactam in 2019 (after reported ceftriaxone  allergy which was in 2018)  Tolerated cefazolin 09/2021 without reaction    Tolerated cefazolin 09/2021 without reaction Received piperacillin /tazobactam in 2019 (after reported ceftriaxone  allergy which was in 2018)   Other Hives   Sulfasalazine Hives   Sulfa  Antibiotics Hives  "

## 2024-09-05 NOTE — ED Provider Notes (Addendum)
 "  [facility] Provider Note    Event Date/Time   First MD Initiated Contact with Patient 09/05/24 1638     (approximate)   History   Chief Complaint: Weakness (Patient to ER via EMS from home for C/O feeling bad since yesterday with nausea and vomiting )   HPI  Christian [name] is a 84 y.o. male with a history of bladder cancer post cystectomy and ileal conduit urostomy, atrial fibrillation, hypertension, diabetes who comes ED complaining of generalized weakness and dizziness that started yesterday evening, associate with nausea and vomiting.  Felt lightheaded when he stood up.  EMS report standing blood pressure was 70/40.  They gave 1 L IV fluids with improvement in blood pressure.  Patient reports he feels better now.  Does report poor oral intake over the past few days due to loss of appetite.  Denies pain.        Past Medical History:  Diagnosis Date   Aortic aneurysm    followed by vascular surgery yearly.   Bladder cancer [facility])    s/p bladder resection. [facility]   Coronary artery disease    AMI anterior wall 1997; s/p PTCA to LAD 11/1995 [facility].   Diabetes mellitus without complication (HCC)    DVT (deep venous thrombosis) (HCC) 10/07/2007   Glucose intolerance (impaired glucose tolerance)    Hydrocele    Hyperlipidemia    Hypertension    MI (myocardial infarction) (HCC) 08/27/1995   Nephrolithiasis    Sleep apnea     Current Outpatient Rx   Order #: [id number] Class: OTC   Order #: [id number] Class: Print   Order #: [id number] Class: Print   Order #: [id number] Class: Historical Med   Order #: [id number] Class: Historical Med   Order #: [id number] Class: Historical Med   Order #: [id number] Class: Historical Med   Order #: [id number] Class: Historical Med   Order #: [id number] Class: Historical Med   Order #: [id number] Class: Historical Med   Order #: [id number] Class: Historical Med   Order #: [id number] Class: Historical Med   Order #:  [id number] Class: Historical Med   Order #: [id number] Class: Normal   Order #: [id number] Class: Print   Order #: [id number] Class: Historical Med    Past Surgical History:  Procedure Laterality Date   ANGIOPLASTY  1997   [place]   bladder removed     CARDIAC CATHETERIZATION     COLONOSCOPY  08/27/2011   normal.  [facility].   HYDROCELE EXCISION     PROSTATE SURGERY     prostatectomy with bladder resection.    Physical Exam   Triage Vital Signs: ED Triage Vitals [09/05/24 1627]  Encounter Vitals Group     BP 110/61     Girls Systolic BP Percentile      Girls Diastolic BP Percentile      Boys Systolic BP Percentile      Boys Diastolic BP Percentile      Pulse Rate 78     Resp 20     Temp 98.3 F (36.8 C)     Temp src      SpO2 97 %     Weight 243 lb (110.2 kg)     Height 5' 10 (1.778 m)     Head Circumference      Peak Flow      Pain Score 0     Pain Loc      Pain Education      Exclude from Growth Chart     Most recent vital signs:  Vitals:   09/05/24 2015 09/05/24 2115  BP:    Pulse: 89 85  Resp: 18 20  Temp:    SpO2: 96% 97%    General: Awake, no distress.  CV:  Good peripheral perfusion.  Regular rate rhythm Resp:  Normal effort.  Clear lungs Abd:  No distention.  Soft nontender Other:  Dry oral mucosa, cracked lips   ED Results / Procedures / Treatments   Labs (all labs ordered are listed, but only abnormal results are displayed) Labs Reviewed  COMPREHENSIVE METABOLIC PANEL WITH GFR - Abnormal; Notable for the following components:      Result Value   Glucose, Bld 65 (*)    Creatinine, Ser 1.43 (*)    AST 14 (*)    GFR, Estimated 49 (*)    All other components within normal limits  CBC WITH DIFFERENTIAL/PLATELET - Abnormal; Notable for the following components:   RBC 3.73 (*)    Hemoglobin 12.4 (*)    HCT 37.0 (*)    Monocytes Absolute 1.3 (*)    All other components within normal limits  LACTIC ACID, PLASMA - Abnormal;  Notable for the following components:   Lactic Acid, Venous 2.2 (*)    All other components within normal limits  URINALYSIS, W/ REFLEX TO CULTURE (INFECTION SUSPECTED) - Abnormal; Notable for the following components:   Color, Urine YELLOW (*)    APPearance HAZY (*)    Hgb urine dipstick SMALL (*)    Nitrite POSITIVE (*)    Leukocytes,Ua SMALL (*)    Bacteria, UA FEW (*)    All other components within normal limits  RESP PANEL BY RT-PCR (RSV, FLU A&B, COVID)  [id number]  URINE CULTURE  MAGNESIUM  PHOSPHORUS  LACTIC ACID, PLASMA     EKG Interpreted by me Sinus rhythm rate of 79.  Normal axis.  Prolonged QTc of 594 ms.  Normal QRS ST segments and T waves.   RADIOLOGY Chest x-ray interpreted by me, unremarkable.  Radiology report reviewed   PROCEDURES:  Procedures   MEDICATIONS ORDERED IN ED: Medications  aztreonam  (AZACTAM ) 2 g in sodium chloride  0.9 % 100 mL IVPB (2 g Intravenous New Bag/Given 09/05/24 2308)  insulin  aspart (novoLOG ) injection 0-9 Units (has no administration in time range)  insulin  aspart (novoLOG ) injection 0-5 Units (has no administration in time range)  dextrose  50 % solution 50 mL (has no administration in time range)  sodium chloride  0.9 % bolus 1,000 mL (0 mLs Intravenous Stopped 09/05/24 1838)  hydrocortisone  sodium succinate  (SOLU-CORTEF ) 100 MG injection 50 mg (50 mg Intravenous Given 09/05/24 1656)     IMPRESSION / MDM / ASSESSMENT AND PLAN / ED COURSE  I reviewed the triage vital signs and the nursing notes.  DDx: Dehydration, electrolyte derangement, anemia, UTI, pneumonia, COVID, influenza, AKI  Patient's presentation is most consistent with acute presentation with potential threat to life or bodily function.  Patient presents with orthostatic hypotension at home, improved with IV fluids.  Has been has being nausea vomiting for the past few days, suspect viral illness.  Vital signs on arrival are normal, patient reports feeling better.  Exam  nonfocal.  Will check labs and give IV fluids.  He does have adrenal insufficiency, will give additional stress dose steroids.   ----------------------------------------- 10:44 PM on 09/05/2024 ----------------------------------------- Lactate improved and normalized with IV fluids.  Blood pressures remained stable.  No refractory shock.  Vasopressors not indicated.  UA is positive for UTI, and with ileal conduit, he essentially  progresses immediately to pyelonephritis.  With the hypotension, will need to hospitalize for IV antibiotics and observation.  Clinical Course as of 09/05/24 2312  Sun Sep 05, 2024  2311 Case discussed with hospitalist [PS]    Clinical Course User Index [PS] Viviann Pastor, MD     FINAL CLINICAL IMPRESSION(S) / ED DIAGNOSES   Final diagnoses:  Acute pyelonephritis  Sepsis, due to unspecified organism, unspecified whether acute organ dysfunction present [facility])     Rx / DC Orders   ED Discharge Orders     None        Note:  This document was prepared using Dragon voice recognition software and may include unintentional dictation errors.   Viviann Pastor, MD 09/05/24 2245    Viviann Pastor, MD 09/05/24 2312  "

## 2024-09-05 NOTE — H&P (Signed)
 " History and Physical    Christian [name]:[id number] DOB: 1941/02/25 DOA: 09/05/2024  Referring MD/NP/PA:   PCP: Claudene Rayfield HERO, MD   Patient coming from:  The patient is coming from home.     Chief Complaint: Generalized weakness, dizziness  HPI: Christian [name] is a 84 y.o. male with medical history significant of bladder cancer of bladder cancer metastasized to intra abdominal lymph nodes (post cystectomy and ileal conduit urostomy), DM, DVT and A fib on Eliquis , CAD, HTN, DM,  adrenal insufficiency due to cancer therapy, CKD 3a, hypothyroidism, who presents with generalized weakness, dizziness.        Data reviewed independently and ED Course: pt was found to have     ***       EKG: I have personally reviewed.  Not done in ED, will get one.   ***   Review of Systems:   General: no fevers, chills, no body weight gain, has poor appetite, has fatigue HEENT: no blurry vision, hearing changes or sore throat Respiratory: no dyspnea, coughing, wheezing CV: no chest pain, no palpitations [name]: no nausea, vomiting, abdominal pain, diarrhea, constipation GU: no dysuria, burning on urination, increased urinary frequency, hematuria  Ext: no leg edema Neuro: no unilateral weakness, numbness, or tingling, no vision change or hearing loss Skin: no rash, no skin tear. MSK: No muscle spasm, no deformity, no limitation of range of movement in spin Heme: No easy bruising.  Travel history: No recent long distant travel.   Allergy: Allergies[1]  Past Medical History:  Diagnosis Date   Aortic aneurysm    followed by vascular surgery yearly.   Bladder cancer Christian [name])    s/p bladder resection. [place]   Coronary artery disease    AMI anterior wall 1997; s/p PTCA to LAD 11/1995 [name] [name].   Diabetes mellitus without complication (HCC)    DVT (deep venous thrombosis) (HCC) 10/07/2007   Glucose intolerance (impaired glucose tolerance)    Hydrocele    Hyperlipidemia    Hypertension    MI  (myocardial infarction) (HCC) 08/27/1995   Nephrolithiasis    Sleep apnea     Past Surgical History:  Procedure Laterality Date   ANGIOPLASTY  1997   [place]   bladder removed     CARDIAC CATHETERIZATION     COLONOSCOPY  08/27/2011   normal.  Christian [name].   HYDROCELE EXCISION     PROSTATE SURGERY     prostatectomy with bladder resection.    Social History:  reports that he has never smoked. He has never used smokeless tobacco. He reports that he does not drink alcohol and does not use drugs.  Family History:  Family History  Problem Relation Age of Onset   Stroke Mother    Diabetes Mother    Lung cancer Father    Cancer Father        lung cancer   Diabetes Sister    Diabetes Brother      Prior to Admission medications  Medication Sig Start Date End Date Taking? Authorizing Provider  acetaminophen  (TYLENOL ) 325 MG tablet Take 1-2 tablets (325-650 mg total) by mouth every 6 (six) hours as needed for mild pain (pain score 1-3), moderate pain (pain score 4-6), fever or headache (or Fever >/= 101). 10/20/23   Alexander, Natalie, DO  apixaban  (ELIQUIS ) 5 MG TABS tablet Take 1 tablet (5 mg total) by mouth 2 (two) times daily. 08/17/18   Gollan, Timothy J, MD  atorvastatin  (LIPITOR) 10 MG tablet Take  1 tablet (10 mg total) by mouth daily at 6 PM. 02/23/18   Claudene Rayfield HERO, MD  Continuous Glucose Receiver (DEXCOM G7 RECEIVER) Christian [name] See admin instructions.    [provider]  Continuous Glucose Sensor (DEXCOM G7 SENSOR) MISC USE 1 EACH EVERY 10 DAYS    [provider]  fluticasone  (FLONASE ) 50 MCG/ACT nasal spray Place 2 sprays into both nostrils as needed. 04/19/19   [provider]  gabapentin  (NEURONTIN ) 300 MG capsule Take 600 mg by mouth 2 (two) times daily. 07/23/21   [provider]  hydrocortisone  (CORTEF ) 10 MG tablet Take 20 mg in the morning and 10 mg at night 02/20/23   [provider]  Insulin  Aspart FlexPen (NOVOLOG ) 100  UNIT/ML Inject 25-40 units subcutaneously before meals three times daily as directed. Max Total Daily Dose 130 units per day. 02/17/23   [provider]  Lancets (ONETOUCH DELICA PLUS [place]) MISC Apply 1 each topically 3 (three) times daily. 09/30/22   [provider]  LANTUS  SOLOSTAR 100 UNIT/ML Solostar Pen Inject 48 Units into the skin at bedtime. 10/07/22   [provider]  levothyroxine  (SYNTHROID ) 112 MCG tablet Take 112 mcg by mouth daily before breakfast. 02/20/23 07/27/24  [provider]  lidocaine (LIDODERM) 5 % Place onto the skin as directed. 09/21/23   [provider]  nitrofurantoin , macrocrystal-monohydrate, (MACROBID ) 100 MG capsule Take 1 capsule (100 mg total) by mouth at bedtime. 11/29/23   Barbarann Nest, MD  nitroGLYCERIN  (NITROSTAT ) 0.4 MG SL tablet Place 1 tablet (0.4 mg total) under the tongue every 5 (five) minutes as needed for chest pain. 02/23/18   Claudene Rayfield HERO, MD  Christian [name] ULTRA test strip 1 each (1 strip total) 3 (three) times daily Use as instructed. Dx code: E11.9 09/30/22   [provider]    Physical Exam: Vitals:   09/05/24 1825 09/05/24 1830 09/05/24 2015 09/05/24 2115  BP: 116/61 111/65    Pulse: 79 99 89 85  Resp: (!) 22 (!) 22 18 20   Temp:      SpO2: 96% 99% 96% 97%  Weight:      Height:       General: Not in acute distress HEENT:       Eyes: PERRL, EOMI, no jaundice       ENT: No discharge from the ears and nose, no pharynx injection, no tonsillar enlargement.        Neck: No JVD, no bruit, no mass felt. Heme: No neck lymph node enlargement. Cardiac: S1/S2, RRR, No murmurs, No gallops or rubs. Respiratory: No rales, wheezing, rhonchi or rubs. [name]: Soft, nondistended, nontender, no rebound pain, no organomegaly, BS present. GU: No hematuria Ext: No pitting leg edema bilaterally. 1+DP/PT pulse bilaterally. Musculoskeletal: No joint deformities, No joint redness or warmth, no limitation of ROM in  spin. Skin: No rashes.  Neuro: Alert, oriented X3, cranial nerves II-XII grossly intact, moves all extremities normally. Muscle strength 5/5 in all extremities, sensation to light touch intact. Brachial reflex 2+ bilaterally. Knee reflex 1+ bilaterally. Negative Babinski's sign. Normal finger to nose test. Psych: Patient is not psychotic, no suicidal or hemocidal ideation.  Labs on Admission: I have personally reviewed following labs and imaging studies  CBC: Recent Labs  Lab 09/05/24 1642  WBC 9.7  NEUTROABS 5.8  HGB 12.4*  HCT 37.0*  MCV 99.2  PLT 208   Basic Metabolic Panel: Recent Labs  Lab 09/05/24 1642  NA 136  K 3.7  CL  99  CO2 27  GLUCOSE 65*  BUN 15  CREATININE 1.43*  CALCIUM  9.3  MG 1.9  PHOS 3.2   GFR: Estimated Creatinine Clearance: 50.4 mL/min (A) (by C-G formula based on SCr of 1.43 mg/dL (H)). Liver Function Tests: Recent Labs  Lab 09/05/24 1642  AST 14*  ALT 6  ALKPHOS 80  BILITOT 0.6  PROT 7.5  ALBUMIN 4.0   No results for input(s): LIPASE, AMYLASE in the last 168 hours. No results for input(s): AMMONIA in the last 168 hours. Coagulation Profile: No results for input(s): INR, PROTIME in the last 168 hours. Cardiac Enzymes: No results for input(s): CKTOTAL, CKMB, CKMBINDEX, TROPONINI in the last 168 hours. BNP (last 3 results) No results for input(s): PROBNP in the last 8760 hours. HbA1C: No results for input(s): HGBA1C in the last 72 hours. CBG: Recent Labs  Lab 09/05/24 2313  GLUCAP 342*   Lipid Profile: No results for input(s): CHOL, HDL, LDLCALC, TRIG, CHOLHDL, LDLDIRECT in the last 72 hours. Thyroid  Function Tests: No results for input(s): TSH, T4TOTAL, FREET4, T3FREE, THYROIDAB in the last 72 hours. Anemia Panel: No results for input(s): VITAMINB12, FOLATE, FERRITIN, TIBC, IRON, RETICCTPCT in the last 72 hours. Urine analysis:    Component Value Date/Time   COLORURINE  YELLOW (A) 09/05/2024 1821   APPEARANCEUR HAZY (A) 09/05/2024 1821   LABSPEC 1.009 09/05/2024 1821   PHURINE 6.0 09/05/2024 1821   GLUCOSEU NEGATIVE 09/05/2024 1821   HGBUR SMALL (A) 09/05/2024 1821   BILIRUBINUR NEGATIVE 09/05/2024 1821   BILIRUBINUR negative 02/23/2018 1048   BILIRUBINUR neg 03/01/2015 1132   KETONESUR NEGATIVE 09/05/2024 1821   PROTEINUR NEGATIVE 09/05/2024 1821   UROBILINOGEN 0.2 02/23/2018 1048   NITRITE POSITIVE (A) 09/05/2024 1821   LEUKOCYTESUR SMALL (A) 09/05/2024 1821   Sepsis Labs: @LABRCNTIP (procalcitonin:4,lacticidven:4) ) Recent Results (from the past 240 hours)  Resp panel by RT-PCR (RSV, Flu A&B, Covid) Anterior Nasal Swab     Status: None   Collection Time: 09/05/24  4:41 PM   Specimen: Anterior Nasal Swab  Result Value Ref Range Status   SARS Coronavirus 2 by RT PCR NEGATIVE NEGATIVE Final    Comment: (NOTE) SARS-CoV-2 target nucleic acids are NOT DETECTED.  The SARS-CoV-2 RNA is generally detectable in upper respiratory specimens during the acute phase of infection. The lowest concentration of SARS-CoV-2 viral copies this assay can detect is 138 copies/mL. A negative result does not preclude SARS-Cov-2 infection and should not be used as the sole basis for treatment or other patient management decisions. A negative result may occur with  improper specimen collection/handling, submission of specimen other than nasopharyngeal swab, presence of viral mutation(s) within the areas targeted by this assay, and inadequate number of viral copies(<138 copies/mL). A negative result must be combined with clinical observations, patient history, and epidemiological information. The expected result is Negative.  Fact Sheet for Patients:  bloggercourse.com  Fact Sheet for Healthcare Providers:  seriousbroker.it  This test is no t yet approved or cleared by the [place]  FDA and  has been authorized  for detection and/or diagnosis of SARS-CoV-2 by FDA under an Emergency Use Authorization (EUA). This EUA will remain  in effect (meaning this test can be used) for the duration of the COVID-19 declaration under Section 564(b)(1) of the Act, 21 U.S.C.section 360bbb-3(b)(1), unless the authorization is terminated  or revoked sooner.       Influenza A by PCR NEGATIVE NEGATIVE Final   Influenza B by PCR NEGATIVE NEGATIVE Final  Comment: (NOTE) The Xpert Xpress SARS-CoV-2/FLU/RSV plus assay is intended as an aid in the diagnosis of influenza from Nasopharyngeal swab specimens and should not be used as a sole basis for treatment. Nasal washings and aspirates are unacceptable for Xpert Xpress SARS-CoV-2/FLU/RSV testing.  Fact Sheet for Patients: bloggercourse.com  Fact Sheet for Healthcare Providers: seriousbroker.it  This test is not yet approved or cleared by the [place]  FDA and has been authorized for detection and/or diagnosis of SARS-CoV-2 by FDA under an Emergency Use Authorization (EUA). This EUA will remain in effect (meaning this test can be used) for the duration of the COVID-19 declaration under Section 564(b)(1) of the Act, 21 U.S.C. section 360bbb-3(b)(1), unless the authorization is terminated or revoked.     Resp Syncytial Virus by PCR NEGATIVE NEGATIVE Final    Comment: (NOTE) Fact Sheet for Patients: bloggercourse.com  Fact Sheet for Healthcare Providers: seriousbroker.it  This test is not yet approved or cleared by the [place]  FDA and has been authorized for detection and/or diagnosis of SARS-CoV-2 by FDA under an Emergency Use Authorization (EUA). This EUA will remain in effect (meaning this test can be used) for the duration of the COVID-19 declaration under Section 564(b)(1) of the Act, 21 U.S.C. section 360bbb-3(b)(1), unless the authorization is  terminated or revoked.  Performed at [name], [address] [place], [place] [address]      Radiological Exams on Admission:   Assessment/Plan Principal Problem:   Complicated UTI (urinary tract infection) Active Problems:   Adrenal insufficiency due to cancer therapy   Hypotension   Coronary artery disease   Hypothyroidism   Type II diabetes mellitus with renal manifestations (HCC)   Bladder cancer metastasized to intra-abdominal lymph nodes (HCC)   History of DVT (deep vein thrombosis)   Chronic atrial fibrillation (HCC)   Chronic kidney disease, stage 3a (HCC)   Obesity (BMI 30-39.9)   Assessment and Plan:  Principal Problem:   Complicated UTI (urinary tract infection) Active Problems:   Adrenal insufficiency due to cancer therapy   Hypotension   Coronary artery disease   Hypothyroidism   Type II diabetes mellitus with renal manifestations (HCC)   Bladder cancer metastasized to intra-abdominal lymph nodes (HCC)   History of DVT (deep vein thrombosis)   Chronic atrial fibrillation (HCC)   Chronic kidney disease, stage 3a (HCC)   Obesity (BMI 30-39.9)      DVT ppx: on Eliquis   Code Status: Full code   ***  Family Communication:     not done, no family member is at bed side.              Yes, patient's    at bed side.       by phone   ***  Disposition Plan:  Anticipate discharge back to previous environment  Consults called:    Admission status and Level of care: Telemetry:    for obs as inpt        Dispo: The patient is from: {From:23814}              Anticipated d/c is to: {To:23815}              Anticipated d/c date is: {Days:23816}              Patient currently {Medically stable:23817}    Severity of Illness:  {Observation/Inpatient:21159}       Date of Service 09/05/2024    Caleb Exon [facility]   If 7PM-7AM, please contact  night-coverage [email or web address] 09/05/2024, 11:30 PM     [1]   Allergies Allergen Reactions   Ceftriaxone  Hives    Tolerated cefazolin 09/2021 without reaction  Received piperacillin /tazobactam in 2019 (after reported ceftriaxone  allergy which was in 2018)  Tolerated cefazolin 09/2021 without reaction    Tolerated cefazolin 09/2021 without reaction Received piperacillin /tazobactam in 2019 (after reported ceftriaxone  allergy which was in 2018)   Other Hives   Sulfasalazine Hives   Sulfa  Antibiotics Hives   "

## 2024-09-06 DIAGNOSIS — N390 Urinary tract infection, site not specified: Secondary | ICD-10-CM | POA: Diagnosis not present

## 2024-09-06 LAB — BASIC METABOLIC PANEL WITH GFR
Anion gap: 10 (ref 5–15)
BUN: 17 mg/dL (ref 8–23)
CO2: 23 mmol/L (ref 22–32)
Calcium: 9.3 mg/dL (ref 8.9–10.3)
Chloride: 99 mmol/L (ref 98–111)
Creatinine, Ser: 1.34 mg/dL — ABNORMAL HIGH (ref 0.61–1.24)
GFR, Estimated: 53 mL/min — ABNORMAL LOW
Glucose, Bld: 315 mg/dL — ABNORMAL HIGH (ref 70–99)
Potassium: 5 mmol/L (ref 3.5–5.1)
Sodium: 132 mmol/L — ABNORMAL LOW (ref 135–145)

## 2024-09-06 LAB — CBC
HCT: 36.2 % — ABNORMAL LOW (ref 39.0–52.0)
Hemoglobin: 12.1 g/dL — ABNORMAL LOW (ref 13.0–17.0)
MCH: 33 pg (ref 26.0–34.0)
MCHC: 33.4 g/dL (ref 30.0–36.0)
MCV: 98.6 fL (ref 80.0–100.0)
Platelets: 207 K/uL (ref 150–400)
RBC: 3.67 MIL/uL — ABNORMAL LOW (ref 4.22–5.81)
RDW: 12.8 % (ref 11.5–15.5)
WBC: 6.8 K/uL (ref 4.0–10.5)
nRBC: 0 % (ref 0.0–0.2)

## 2024-09-06 LAB — CBG MONITORING, ED: Glucose-Capillary: 326 mg/dL — ABNORMAL HIGH (ref 70–99)

## 2024-09-06 LAB — GLUCOSE, CAPILLARY
Glucose-Capillary: 303 mg/dL — ABNORMAL HIGH (ref 70–99)
Glucose-Capillary: 405 mg/dL — ABNORMAL HIGH (ref 70–99)
Glucose-Capillary: 481 mg/dL — ABNORMAL HIGH (ref 70–99)
Glucose-Capillary: 426 mg/dL — ABNORMAL HIGH (ref 70–99)
Glucose-Capillary: 331 mg/dL — ABNORMAL HIGH (ref 70–99)
Glucose-Capillary: 195 mg/dL — ABNORMAL HIGH (ref 70–99)

## 2024-09-06 MED ORDER — SODIUM CHLORIDE 0.9 % IV SOLN
2.0000 g | Freq: Three times a day (TID) | INTRAVENOUS | Status: DC
  Administered 2024-09-06: 2 g via INTRAVENOUS
  Filled 2024-09-06: qty 12.5

## 2024-09-06 MED ORDER — ATORVASTATIN CALCIUM 10 MG PO TABS
10.0000 mg | ORAL_TABLET | Freq: Every day | ORAL | Status: DC
  Administered 2024-09-06 – 2024-09-07 (×2): 10 mg via ORAL
  Filled 2024-09-06 (×2): qty 1

## 2024-09-06 MED ORDER — INSULIN ASPART 100 UNIT/ML IJ SOLN
20.0000 [IU] | Freq: Once | INTRAMUSCULAR | Status: AC
  Administered 2024-09-06: 20 [IU] via SUBCUTANEOUS
  Filled 2024-09-06: qty 20

## 2024-09-06 MED ORDER — INSULIN GLARGINE 100 UNIT/ML ~~LOC~~ SOLN
48.0000 [IU] | Freq: Every day | SUBCUTANEOUS | Status: DC
  Filled 2024-09-06: qty 0.48

## 2024-09-06 MED ORDER — HYDROCORTISONE SOD SUC (PF) 100 MG IJ SOLR
100.0000 mg | Freq: Two times a day (BID) | INTRAMUSCULAR | Status: DC
  Administered 2024-09-06: 100 mg via INTRAVENOUS
  Filled 2024-09-06 (×2): qty 2

## 2024-09-06 MED ORDER — LEVOTHYROXINE SODIUM 112 MCG PO TABS
112.0000 ug | ORAL_TABLET | Freq: Every day | ORAL | Status: DC
  Administered 2024-09-06 – 2024-09-08 (×3): 112 ug via ORAL
  Filled 2024-09-06 (×3): qty 1

## 2024-09-06 MED ORDER — GABAPENTIN 300 MG PO CAPS
600.0000 mg | ORAL_CAPSULE | Freq: Two times a day (BID) | ORAL | Status: DC
  Administered 2024-09-06 – 2024-09-08 (×6): 600 mg via ORAL
  Filled 2024-09-06 (×6): qty 2

## 2024-09-06 MED ORDER — INSULIN ASPART 100 UNIT/ML IJ SOLN
0.0000 [IU] | Freq: Three times a day (TID) | INTRAMUSCULAR | Status: DC
  Administered 2024-09-06: 20 [IU] via SUBCUTANEOUS
  Administered 2024-09-06: 15 [IU] via SUBCUTANEOUS
  Administered 2024-09-07: 20 [IU] via SUBCUTANEOUS
  Administered 2024-09-07: 7 [IU] via SUBCUTANEOUS
  Administered 2024-09-07: 11 [IU] via SUBCUTANEOUS
  Administered 2024-09-08: 7 [IU] via SUBCUTANEOUS
  Filled 2024-09-06: qty 20
  Filled 2024-09-06: qty 15
  Filled 2024-09-06: qty 20
  Filled 2024-09-06 (×2): qty 7
  Filled 2024-09-06: qty 11

## 2024-09-06 MED ORDER — FLUTICASONE PROPIONATE 50 MCG/ACT NA SUSP: 2.0000 | NASAL | Status: DC | PRN

## 2024-09-06 MED ORDER — INSULIN ASPART 100 UNIT/ML IJ SOLN
8.0000 [IU] | Freq: Three times a day (TID) | INTRAMUSCULAR | Status: DC
  Administered 2024-09-06 (×2): 8 [IU] via SUBCUTANEOUS
  Filled 2024-09-06 (×2): qty 8

## 2024-09-06 MED ORDER — NITROGLYCERIN 0.4 MG SL SUBL: 0.4000 mg | SUBLINGUAL_TABLET | SUBLINGUAL | Status: DC | PRN

## 2024-09-06 MED ORDER — INSULIN ASPART 100 UNIT/ML IJ SOLN
12.0000 [IU] | Freq: Three times a day (TID) | INTRAMUSCULAR | Status: DC
  Administered 2024-09-06: 12 [IU] via SUBCUTANEOUS
  Filled 2024-09-06: qty 12

## 2024-09-06 MED ORDER — APIXABAN 5 MG PO TABS
5.0000 mg | ORAL_TABLET | Freq: Two times a day (BID) | ORAL | Status: DC
  Administered 2024-09-06 – 2024-09-08 (×6): 5 mg via ORAL
  Filled 2024-09-06 (×6): qty 1

## 2024-09-06 MED ORDER — SODIUM CHLORIDE 0.9 % IV SOLN
2.0000 g | Freq: Three times a day (TID) | INTRAVENOUS | Status: DC
  Administered 2024-09-06: 2 g via INTRAVENOUS
  Filled 2024-09-06 (×2): qty 10

## 2024-09-06 MED ORDER — SODIUM CHLORIDE 0.9 % IV SOLN
2.0000 g | Freq: Two times a day (BID) | INTRAVENOUS | Status: AC
  Administered 2024-09-07 (×2): 2 g via INTRAVENOUS
  Filled 2024-09-06 (×2): qty 12.5

## 2024-09-06 MED ORDER — INSULIN ASPART 100 UNIT/ML IJ SOLN
15.0000 [IU] | Freq: Once | INTRAMUSCULAR | Status: AC
  Administered 2024-09-06: 15 [IU] via SUBCUTANEOUS
  Filled 2024-09-06: qty 15

## 2024-09-06 NOTE — Plan of Care (Signed)
" °  Problem: Education: Goal: Ability to describe self-care measures that may prevent or decrease complications (Diabetes Survival Skills Education) will improve Outcome: Progressing   Problem: Coping: Goal: Ability to adjust to condition or change in health will improve Outcome: Progressing   Problem: Fluid Volume: Goal: Ability to maintain a balanced intake and output will improve Outcome: Progressing   Problem: Health Behavior/Discharge Planning: Goal: Ability to identify and utilize available resources and services will improve Outcome: Progressing Goal: Ability to manage health-related needs will improve Outcome: Progressing   Problem: Nutritional: Goal: Maintenance of adequate nutrition will improve Outcome: Progressing   Problem: Skin Integrity: Goal: Risk for impaired skin integrity will decrease Outcome: Progressing   Problem: Metabolic: Goal: Ability to maintain appropriate glucose levels will improve Outcome: Not Progressing   Problem: Nutritional: Goal: Progress toward achieving an optimal weight will improve Outcome: Not Progressing   "

## 2024-09-06 NOTE — Progress Notes (Signed)
 Mobility Specialist Progress Note:    09/06/24 1457  Mobility  Activity Ambulated with assistance  Level of Assistance Modified independent, requires aide device or extra time  Assistive Device None  Distance Ambulated (ft) 200 ft  Range of Motion/Exercises Active;All extremities  Activity Response Tolerated well  Mobility visit 1 Mobility  Mobility Specialist Start Time (ACUTE ONLY) 1438  Mobility Specialist Stop Time (ACUTE ONLY) 1457  Mobility Specialist Time Calculation (min) (ACUTE ONLY) 19 min   Pt received in chair, agreeable to mobility. ModI to stand and ambulate with no AD. Tolerated well, asx throughout. Returned to room, left pt in bed. All needs met.  Sherrilee Ditty Mobility Specialist Please contact via Special Educational Needs Teacher or  Rehab office at [phone]

## 2024-09-06 NOTE — Evaluation (Signed)
 Occupational Therapy Evaluation Patient Details Name: Christian [name] MRN: [id number] DOB: 1940-10-22 Today's Date: 09/06/2024   History of Present Illness   Pt is a 84 y.o. male who presents with generalized weakness, dizziness, syncope, N+V. Admitted for management of complicated UTI. PMH of bladder cancer metastasized to intra abdominal lymph nodes (post cystectomy and ileal conduit urostomy), DM, DVT and A fib on Eliquis , CAD, HTN, DM,  adrenal insufficiency due to cancer therapy, CKD 3a, hypothyroidism.     Clinical Impressions Pt was seen for OT evaluation this date. PTA, pt resides at home with his wife and is typically IND with all tasks, drives, community mobile. 1 recent assisted fall leading to admission d/t hypotension. Pt demo bed mobility with MOD I, STS with independence and no AD use. He was able to ambulate ~90 ft with supervision/MOD I pushing IV pole to transfer rooms this date. Additional mobility and standing grooming tasks performed with MOD I and no LOB during session. Pt denies any further acute therapy needs and denies need for follow up therapy services upon DC. Will sign off in house. HR up to 105 during session.      If plan is discharge home, recommend the following:         Functional Status Assessment   Patient has not had a recent decline in their functional status     Equipment Recommendations   None recommended by OT     Recommendations for Other Services         Precautions/Restrictions   Precautions Precautions: None Recall of Precautions/Restrictions: Intact Restrictions Weight Bearing Restrictions Per Provider Order: No     Mobility Bed Mobility Overal bed mobility: Independent             General bed mobility comments: from full supine position    Transfers Overall transfer level: Modified independent Equipment used: None               General transfer comment: stood from EOB without assist and no AD use to  ambulate ~90 ft pushing IV pole and additional 15 ft without AD and occasional use of bed rails      Balance Overall balance assessment: Modified Independent                                         ADL either performed or assessed with clinical judgement   ADL Overall ADL's : Independent                                       General ADL Comments: stood at sink to perform oral care and hand washing without UE support and no LOB     Vision         Perception         Praxis         Pertinent Vitals/Pain Pain Assessment Pain Assessment: No/denies pain     Extremity/Trunk Assessment Upper Extremity Assessment Upper Extremity Assessment: Overall WFL for tasks assessed   Lower Extremity Assessment Lower Extremity Assessment: Overall WFL for tasks assessed       Communication Communication Communication: No apparent difficulties   Cognition Arousal: Alert Behavior During Therapy: [facility] for tasks assessed/performed  Following commands: Intact       Cueing  General Comments   Cueing Techniques: Verbal cues  HR 105 with activity RR up to 28   Exercises     Shoulder Instructions      Home Living Family/patient expects to be discharged to:: Private residence Living Arrangements: Spouse/significant other Available Help at Discharge: Family Type of Home: House Home Access: Stairs to enter [employer] of Steps: 3 from front entrance; 4 from back entrance Entrance Stairs-Rails: Right;Left;Can reach both Home Layout: One level     Bathroom Shower/Tub: Producer, Television/film/video: Handicapped height     Home Equipment: Agricultural Consultant (2 wheels);Cane - single point;BSC/3in1;Shower seat - built in;Hand held shower head;Wheelchair - manual;Grab bars - tub/shower          Prior Functioning/Environment Prior Level of Function : Independent/Modified  Independent;Driving             Mobility Comments: no AD use househld and comunity distances, goes to [employer], drives ADLs Comments: MOD I/IND; reports sits/stands in shower    OT Problem List:     OT Treatment/Interventions:        OT Goals(Current goals can be found in the care plan section)       OT Frequency:       Co-evaluation              AM-PAC OT 6 Clicks Daily Activity     Outcome Measure Help from another person eating meals?: None Help from another person taking care of personal grooming?: None Help from another person toileting, which includes using toliet, bedpan, or urinal?: None Help from another person bathing (including washing, rinsing, drying)?: None Help from another person to put on and taking off regular upper body clothing?: None Help from another person to put on and taking off regular lower body clothing?: None 6 Click Score: 24   End of Session Nurse Communication: Mobility status  Activity Tolerance: Patient tolerated treatment well Patient left: with call bell/phone within reach;in chair  OT Visit Diagnosis: Other abnormalities of gait and mobility (R26.89)                Time: 1000-1030 OT Time Calculation (min): 30 min Charges:  OT General Charges $OT Visit: 1 Visit OT Evaluation $OT Eval Low Complexity: 1 Low OT Treatments $Therapeutic Activity: 8-22 mins  Sherol Sabas Chrismon, OTR/L 09/06/2024, 12:58 PM  Daquann Merriott E Chrismon 09/06/2024, 12:56 PM

## 2024-09-06 NOTE — TOC CM/SW Note (Signed)
 Transition of Care [facility]) CM/SW Note    Transition of Care [facility]) - Inpatient Brief Assessment   Patient Details  Name: Christian [name] MRN: [id number] Date of Birth: November 24, 1940  Transition of Care [facility]) CM/SW Contact:    Alfonso Rummer, LCSW Phone Number: 09/06/2024, 10:03 AM   Clinical Narrative: Completed toc chart review. No toc needs identified please contact toc should needs arise.    Transition of Care Asessment: Insurance and Status: Insurance coverage has been reviewed Patient has primary care physician: Yes BEVERLEE, KRISTI M) Home environment has been reviewed: single family home   Prior/Current Home Services: No current home services Social Drivers of Health Review: SDOH reviewed no interventions necessary Readmission risk has been reviewed: No Transition of care needs: no transition of care needs at this time

## 2024-09-06 NOTE — Progress Notes (Signed)
 " PROGRESS NOTE    Christian [name]  FMW:[id number] DOB: June 09, 1941 DOA: 09/05/2024 PCP: Claudene Rayfield HERO, MD    Brief Narrative:  84 y.o. male with medical history significant of bladder cancer of bladder cancer metastasized to intra abdominal lymph nodes (post cystectomy and ileal conduit urostomy), DM, DVT and A fib on Eliquis , CAD, HTN, DM,  adrenal insufficiency due to cancer therapy, CKD 3a, hypothyroidism, who presents with generalized weakness, dizziness, syncope.   Per patient and his wife at his bedside, he started having generalized weakness and dizziness since yesterday evening.  He has nausea and few times of nonbilious nonbloody vomiting.  No diarrhea or abdominal pain.  No fever or chills.  He states that his dizziness is worse when standing up. Per his wife, when EMS tried to help him stand up, patient passed out for few seconds.  His wife lowered him down to the floor without any injury.  Patient strongly denies any head or neck injury.  Does not want to CT scan of the head.  No unilateral numbness or tingling in extremities.  No facial droop or slurred speech.  Moves all extremities normally.    Per EMS report, patient was hypotensive with blood pressure 70s/40s which improved after giving 1 L IV fluid bolus.  His blood pressure is 111/65 in ED.  Patient denies chest pain, cough, SOB.  No fever or chills.   Assessment & Plan:   Principal Problem:   Complicated UTI (urinary tract infection) Active Problems:   Adrenal insufficiency due to cancer therapy   Hypotension   Syncope   Coronary artery disease   Hypothyroidism   Type II diabetes mellitus with renal manifestations (HCC)   Bladder cancer metastasized to intra-abdominal lymph nodes (HCC)   History of DVT (deep vein thrombosis)   Chronic atrial fibrillation (HCC)   Chronic kidney disease, stage 3a (HCC)   Obesity (BMI 30-39.9)  Complicated UTI (urinary tract infection):  No fever or leukocytosis, clinically not  septic.  Lactic acid 2.2 --> 1.3. Plan: Switch aztreonam  for cefepime .  Confirmed with pharmacy the patient is tolerated in the past.  Follow-up urine culture.  Monitor vitals and fever curve.  Adrenal insufficiency due to cancer therapy: Holding home steroids Started on stress dose steroids 100 mg every 8 hours in ED Plan Slowly wean stress dose.  Dose decreased to 100 mg every 12 hours Monitor vital signs and continue to de-escalate as appropriate   Hypotension:  Blood pressure improved from 70/40 to 111/65.   This is likely due to combination for dehydration and adrenal insufficiency. Plan: Start steroids as above Supplemental IV fluids   Syncope:  Likely due to hypotension.  Patient denies any injury.  Does not want to CT scan of the head.  No focal neurodeficit on physical examination.  No headache.  Mental status normal now.    Coronary artery disease:  No chest pain Lipitor   Hypothyroidism Synthroid    Type II diabetes mellitus with renal manifestations [facility]):  Recent A1c 8.6, poorly controlled.   Patient taking NovoLog  and Lantus  38 units nightly Plan: Basal bolus regimen Carb modified diet DM coordinator consult   Bladder cancer metastasized to intra-abdominal lymph nodes [facility]):  S/p of bladder resection.  Treated with Keytruda S/p of urostomy Outpatient urology follow-up   History of DVT (deep vein thrombosis) Continue Eliquis    Chronic atrial fibrillation (HCC) DC telemetry if heart rate remains stable Continue Eliquis  as above   Chronic kidney disease, stage 3a (  HCC):  Renal function close to baseline.  Baseline creatinine 1.3-1.5.  His creatinine is 1.43, BUN 15, GFR 49.  Monitor renal function   Obesity (BMI 30-39.9):  Patient has Obesity Class I, with body weight 110.2 Kg and BMI 34.87 kg/m2.  Complicating factor in overall care and prognosis   DVT prophylaxis: Eliquis  Code Status: Full Family Communication: Spouse at bedside 1/12 Disposition  Plan: Status is: Inpatient Remains inpatient appropriate because: Complicated UTI   Level of care: Telemetry  Consultants:  None  Procedures:  None  Antimicrobials: Cefepime    Subjective: Seen and examined.  Resting comfortably in bed.  Reports significant symptomatic improvement since admission.  No pain complaints.  Objective: Vitals:   09/06/24 0240 09/06/24 0434 09/06/24 0905 09/06/24 0956  BP: 113/62 133/67 127/62   Pulse: 74 74 72   Resp: 18 16 20 15   Temp: 98.5 F (36.9 C) 97.8 F (36.6 C) 98.5 F (36.9 C)   TempSrc: Oral     SpO2: 95% 99% 98%   Weight: 106.3 kg     Height: 5' 11 (1.803 m)       Intake/Output Summary (Last 24 hours) at 09/06/2024 1220 Last data filed at 09/06/2024 0434 Gross per 24 hour  Intake 1000 ml  Output 550 ml  Net 450 ml   Filed Weights   09/05/24 1627 09/06/24 0240  Weight: 110.2 kg 106.3 kg    Examination:  General exam: Appears calm and comfortable  Respiratory system: Clear to auscultation. Respiratory effort normal. Cardiovascular system: S1-S2, RRR, no murmurs, no pedal edema Gastrointestinal system: Obese soft, NT/ND, normal bowel sounds GU: Urostomy Central nervous system: Alert and oriented. No focal neurological deficits. Extremities: Symmetric 5 x 5 power. Skin: No rashes, lesions or ulcers Psychiatry: Judgement and insight appear normal. Mood & affect appropriate.     Data Reviewed: I have personally reviewed following labs and imaging studies  CBC: Recent Labs  Lab 09/05/24 1642 09/06/24 0538  WBC 9.7 6.8  NEUTROABS 5.8  --   HGB 12.4* 12.1*  HCT 37.0* 36.2*  MCV 99.2 98.6  PLT 208 207   Basic Metabolic Panel: Recent Labs  Lab 09/05/24 1642 09/06/24 0538  NA 136 132*  K 3.7 5.0  CL 99 99  CO2 27 23  GLUCOSE 65* 315*  BUN 15 17  CREATININE 1.43* 1.34*  CALCIUM  9.3 9.3  MG 1.9  --   PHOS 3.2  --    GFR: Estimated Creatinine Clearance: 53.6 mL/min (A) (by C-G formula based on SCr of  1.34 mg/dL (H)). Liver Function Tests: Recent Labs  Lab 09/05/24 1642  AST 14*  ALT 6  ALKPHOS 80  BILITOT 0.6  PROT 7.5  ALBUMIN 4.0   No results for input(s): LIPASE, AMYLASE in the last 168 hours. No results for input(s): AMMONIA in the last 168 hours. Coagulation Profile: No results for input(s): INR, PROTIME in the last 168 hours. Cardiac Enzymes: No results for input(s): CKTOTAL, CKMB, CKMBINDEX, TROPONINI in the last 168 hours. BNP (last 3 results) No results for input(s): PROBNP in the last 8760 hours. HbA1C: No results for input(s): HGBA1C in the last 72 hours. CBG: Recent Labs  Lab 09/05/24 2313 09/06/24 0045 09/06/24 0850  GLUCAP 342* 326* 303*   Lipid Profile: No results for input(s): CHOL, HDL, LDLCALC, TRIG, CHOLHDL, LDLDIRECT in the last 72 hours. Thyroid  Function Tests: No results for input(s): TSH, T4TOTAL, FREET4, T3FREE, THYROIDAB in the last 72 hours. Anemia Panel: No results for input(s):  VITAMINB12, FOLATE, FERRITIN, TIBC, IRON, RETICCTPCT in the last 72 hours. Sepsis Labs: Recent Labs  Lab 09/05/24 1642 09/05/24 2112  LATICACIDVEN 2.2* 1.3    Recent Results (from the past 240 hours)  Resp panel by RT-PCR (RSV, Flu A&B, Covid) Anterior Nasal Swab     Status: None   Collection Time: 09/05/24  4:41 PM   Specimen: Anterior Nasal Swab  Result Value Ref Range Status   SARS Coronavirus 2 by RT PCR NEGATIVE NEGATIVE Final    Comment: (NOTE) SARS-CoV-2 target nucleic acids are NOT DETECTED.  The SARS-CoV-2 RNA is generally detectable in upper respiratory specimens during the acute phase of infection. The lowest concentration of SARS-CoV-2 viral copies this assay can detect is 138 copies/mL. A negative result does not preclude SARS-Cov-2 infection and should not be used as the sole basis for treatment or other patient management decisions. A negative result may occur with  improper specimen  collection/handling, submission of specimen other than nasopharyngeal swab, presence of viral mutation(s) within the areas targeted by this assay, and inadequate number of viral copies(<138 copies/mL). A negative result must be combined with clinical observations, patient history, and epidemiological information. The expected result is Negative.  Fact Sheet for Patients:  bloggercourse.com  Fact Sheet for Healthcare Providers:  seriousbroker.it  This test is no t yet approved or cleared by the [place]  FDA and  has been authorized for detection and/or diagnosis of SARS-CoV-2 by FDA under an Emergency Use Authorization (EUA). This EUA will remain  in effect (meaning this test can be used) for the duration of the COVID-19 declaration under Section 564(b)(1) of the Act, 21 U.S.C.section 360bbb-3(b)(1), unless the authorization is terminated  or revoked sooner.       Influenza A by PCR NEGATIVE NEGATIVE Final   Influenza B by PCR NEGATIVE NEGATIVE Final    Comment: (NOTE) The Xpert Xpress SARS-CoV-2/FLU/RSV plus assay is intended as an aid in the diagnosis of influenza from Nasopharyngeal swab specimens and should not be used as a sole basis for treatment. Nasal washings and aspirates are unacceptable for Xpert Xpress SARS-CoV-2/FLU/RSV testing.  Fact Sheet for Patients: bloggercourse.com  Fact Sheet for Healthcare Providers: seriousbroker.it  This test is not yet approved or cleared by the [place]  FDA and has been authorized for detection and/or diagnosis of SARS-CoV-2 by FDA under an Emergency Use Authorization (EUA). This EUA will remain in effect (meaning this test can be used) for the duration of the COVID-19 declaration under Section 564(b)(1) of the Act, 21 U.S.C. section 360bbb-3(b)(1), unless the authorization is terminated or revoked.     Resp Syncytial  Virus by PCR NEGATIVE NEGATIVE Final    Comment: (NOTE) Fact Sheet for Patients: bloggercourse.com  Fact Sheet for Healthcare Providers: seriousbroker.it  This test is not yet approved or cleared by the [place]  FDA and has been authorized for detection and/or diagnosis of SARS-CoV-2 by FDA under an Emergency Use Authorization (EUA). This EUA will remain in effect (meaning this test can be used) for the duration of the COVID-19 declaration under Section 564(b)(1) of the Act, 21 U.S.C. section 360bbb-3(b)(1), unless the authorization is terminated or revoked.  Performed at [facility], [address] [place], [place] [address]          Radiology Studies: DG Chest Portable 1 View Result Date: 09/05/2024 EXAM: 1 VIEW XRAY OF THE CHEST 09/05/2024 07:03:35 PM COMPARISON: Chest x-ray 10/17/2023, CT angio chest 11/17/2017. CLINICAL HISTORY: weakness FINDINGS: LINES, TUBES AND DEVICES: Right  chest port in place with catheter tip over the right atrium, approximately 3 cm distal to the cavoatrial junction. LUNGS AND PLEURA: Left lung base atelectasis. Chronic coarsened interstitial markings. No pleural effusion. No pneumothorax. HEART AND MEDIASTINUM: No acute abnormality of the cardiac and mediastinal silhouettes. BONES AND SOFT TISSUES: No acute osseous abnormality. IMPRESSION: 1. Left basilar atelectasis. 2. Right chest port with catheter tip projecting over the right atrium approximately 3 cm distal to the cavoatrial junction. 3. Emphysema. Electronically signed by: Morgane Naveau MD MD 09/05/2024 07:09 PM EST RP Workstation: [id number]        Scheduled Meds:  apixaban   5 mg Oral BID   atorvastatin   10 mg Oral q1800   gabapentin   600 mg Oral BID   hydrocortisone  sod succinate (SOLU-CORTEF ) inj  100 mg Intravenous Q12H   insulin  aspart  0-20 Units Subcutaneous TID WC   insulin  aspart  0-5 Units Subcutaneous QHS    insulin  aspart  8 Units Subcutaneous TID WC   insulin  glargine  48 Units Subcutaneous QHS   levothyroxine   112 mcg Oral Q0600   Continuous Infusions:  sodium chloride  75 mL/hr at 09/06/24 0120   aztreonam  2 g (09/06/24 0546)     LOS: 1 day      Christian KATHEE Robson, MD [facility]   If 7PM-7AM, please contact night-coverage  09/06/2024, 12:20 PM   "

## 2024-09-06 NOTE — Progress Notes (Signed)
 PT Cancellation Note  Patient Details Name: Christian [name] MRN: [id number] DOB: 01-26-1941   Cancelled Treatment:    Reason Eval/Treat Not Completed: PT screened, no needs identified, will sign off (Per OT report, pt mobilizaing independently and without any c/o of acute impairment. No acute PT evaluation indicated at this time, will sign off.)  10:50 AM, 09/06/2024 Peggye JAYSON Linear, PT, DPT Physical Therapist - [facility]  [phone] (ASCOM)    Mikal Wisman C 09/06/2024, 10:50 AM

## 2024-09-06 NOTE — Plan of Care (Signed)

## 2024-09-06 NOTE — Inpatient Diabetes Management (Signed)
 Inpatient Diabetes Program Recommendations  AACE/ADA: New Consensus Statement on Inpatient Glycemic Control (2015)  Target Ranges:  Prepandial:   less than 140 mg/dL      Peak postprandial:   less than 180 mg/dL (1-2 hours)      Critically ill patients:  140 - 180 mg/dL   Lab Results  Component Value Date   GLUCAP 303 (H) 09/06/2024   HGBA1C 8.6 (H) 10/10/2023    Latest Reference Range & Units 09/05/24 23:13 09/06/24 00:45 09/06/24 08:50  Glucose-Capillary 70 - 99 mg/dL 657 (H) 673 (H) 696 (H)  (H): Data is abnormally high  Diabetes history: DM2 Outpatient Diabetes medications:  Lantus  48 units daily Novolog  25-40 units tid meal coverage Dexcom G7 Current orders for Inpatient glycemic control: Lantus  48 units daily Novolog  0-20 units tid, 0-5 units hs Solucortef 100 mg q 8 hrs.  Inpatient Diabetes Program Recommendations:   Please consider: -Add Novolog  8 units tid meal coverage if eats 50%  Thank you, Adara Kittle E. Kipper Buch, RN, MSN, CNS, CDCES  Diabetes Coordinator Inpatient Glycemic Control Team Team Pager [phone] (8am-5pm) 09/06/2024 8:58 AM

## 2024-09-07 DIAGNOSIS — N390 Urinary tract infection, site not specified: Secondary | ICD-10-CM | POA: Diagnosis not present

## 2024-09-07 LAB — GLUCOSE, CAPILLARY
Glucose-Capillary: 113 mg/dL — ABNORMAL HIGH (ref 70–99)
Glucose-Capillary: 247 mg/dL — ABNORMAL HIGH (ref 70–99)
Glucose-Capillary: 252 mg/dL — ABNORMAL HIGH (ref 70–99)
Glucose-Capillary: 348 mg/dL — ABNORMAL HIGH (ref 70–99)
Glucose-Capillary: 439 mg/dL — ABNORMAL HIGH (ref 70–99)
Glucose-Capillary: 88 mg/dL (ref 70–99)

## 2024-09-07 LAB — URINE CULTURE: Culture: >=100000 — AB

## 2024-09-07 LAB — CREATININE, SERUM
Creatinine, Ser: 1.48 mg/dL — ABNORMAL HIGH (ref 0.61–1.24)
GFR, Estimated: 47 mL/min — ABNORMAL LOW

## 2024-09-07 LAB — HEMOGLOBIN A1C
Hgb A1c MFr Bld: 7.7 % — ABNORMAL HIGH (ref 4.8–5.6)
Mean Plasma Glucose: 174.29 mg/dL

## 2024-09-07 LAB — GLUCOSE, RANDOM: Glucose, Bld: 440 mg/dL — ABNORMAL HIGH (ref 70–99)

## 2024-09-07 MED ORDER — HYDROCORTISONE 10 MG PO TABS
10.0000 mg | ORAL_TABLET | Freq: Every evening | ORAL | Status: DC
  Administered 2024-09-07: 10 mg via ORAL
  Filled 2024-09-07 (×2): qty 1

## 2024-09-07 MED ORDER — HYDROCORTISONE 10 MG PO TABS
20.0000 mg | ORAL_TABLET | Freq: Every morning | ORAL | Status: DC
  Administered 2024-09-07 – 2024-09-08 (×2): 20 mg via ORAL
  Filled 2024-09-07 (×2): qty 2

## 2024-09-07 MED ORDER — INSULIN ASPART 100 UNIT/ML IJ SOLN
4.0000 [IU] | Freq: Once | INTRAMUSCULAR | Status: AC
  Administered 2024-09-07: 4 [IU] via SUBCUTANEOUS
  Filled 2024-09-07: qty 4

## 2024-09-07 MED ORDER — INSULIN ASPART 100 UNIT/ML IJ SOLN
10.0000 [IU] | Freq: Three times a day (TID) | INTRAMUSCULAR | Status: DC
  Administered 2024-09-07 – 2024-09-08 (×3): 10 [IU] via SUBCUTANEOUS
  Filled 2024-09-07 (×3): qty 10

## 2024-09-07 MED ORDER — INSULIN ASPART 100 UNIT/ML IJ SOLN: 8.0000 [IU] | Freq: Three times a day (TID) | INTRAMUSCULAR | Status: DC

## 2024-09-07 MED ORDER — INSULIN GLARGINE 100 UNIT/ML ~~LOC~~ SOLN
38.0000 [IU] | Freq: Every day | SUBCUTANEOUS | Status: DC
  Administered 2024-09-07: 38 [IU] via SUBCUTANEOUS
  Filled 2024-09-07: qty 0.38

## 2024-09-07 MED ORDER — CIPROFLOXACIN HCL 500 MG PO TABS
500.0000 mg | ORAL_TABLET | Freq: Two times a day (BID) | ORAL | Status: DC
  Administered 2024-09-08: 500 mg via ORAL
  Filled 2024-09-07: qty 1

## 2024-09-07 NOTE — Progress Notes (Signed)
 " PROGRESS NOTE    Christian [name]  FMW:[id number] DOB: 02/03/1941 DOA: 09/05/2024 PCP: Claudene Rayfield HERO, MD    Brief Narrative:  84 y.o. male with medical history significant of bladder cancer of bladder cancer metastasized to intra abdominal lymph nodes (post cystectomy and ileal conduit urostomy), DM, DVT and A fib on Eliquis , CAD, HTN, DM,  adrenal insufficiency due to cancer therapy, CKD 3a, hypothyroidism, who presents with generalized weakness, dizziness, syncope.   Per patient and his wife at his bedside, he started having generalized weakness and dizziness since yesterday evening.  He has nausea and few times of nonbilious nonbloody vomiting.  No diarrhea or abdominal pain.  No fever or chills.  He states that his dizziness is worse when standing up. Per his wife, when EMS tried to help him stand up, patient passed out for few seconds.  His wife lowered him down to the floor without any injury.  Patient strongly denies any head or neck injury.  Does not want to CT scan of the head.  No unilateral numbness or tingling in extremities.  No facial droop or slurred speech.  Moves all extremities normally.    Per EMS report, patient was hypotensive with blood pressure 70s/40s which improved after giving 1 L IV fluid bolus.  His blood pressure is 111/65 in ED.  Patient denies chest pain, cough, SOB.  No fever or chills.   Assessment & Plan:   Principal Problem:   Complicated UTI (urinary tract infection) Active Problems:   Adrenal insufficiency due to cancer therapy   Hypotension   Syncope   Coronary artery disease   Hypothyroidism   Type II diabetes mellitus with renal manifestations (HCC)   Bladder cancer metastasized to intra-abdominal lymph nodes (HCC)   History of DVT (deep vein thrombosis)   Chronic atrial fibrillation (HCC)   Chronic kidney disease, stage 3a (HCC)   Obesity (BMI 30-39.9)  Complicated UTI (urinary tract infection):  No fever or leukocytosis, clinically not  septic.  Lactic acid 2.2 --> 1.3. Urine culture with E. coli only resistant to Macrobid  Plan: Continue cefepime  for today, last dose this afternoon Start p.o. ciprofloxacin  from 1/14 Recommend total 7 to 10-day antibiotic course If patient remains stable anticipate discharge 1/14  Adrenal insufficiency due to cancer therapy: Holding home steroids Started on stress dose steroids 100 mg every 8 hours in ED Plan Discontinue stress dose steroids Restart home Solu-Cortef  regimen 20 every morning, 10 every afternoon   Hypotension:  Blood pressure improved from 70/40 to 111/65.   This is likely due to combination for dehydration and adrenal insufficiency. Plan: Home steroid regimen as above Pressure control improved   Syncope:  Likely due to hypotension.  Patient denies any injury.  Does not want to CT scan of the head.  No focal neurodeficit on physical examination.  No headache.  Mental status normal now.    Coronary artery disease:  No chest pain Lipitor   Hypothyroidism Synthroid    Insulin -dependent diabetes mellitus [facility]):  Recent A1c 8.6, poorly controlled.   Patient taking NovoLog  and Lantus  38 units nightly He follows with endocrinology at [facility] Per patient his goal sugar levels are 200-250 Plan: Basal bolus regimen Carb modified diet DM coordinator consult CBGs AC and at bedtime   Bladder cancer metastasized to intra-abdominal lymph nodes [facility]):  S/p of bladder resection.  Treated with Keytruda S/p of urostomy Outpatient urology follow-up   History of DVT (deep vein thrombosis) Continue Eliquis    Chronic atrial fibrillation (  HCC) DC telemetry Continue Eliquis  as above   Chronic kidney disease, stage 3a (HCC):  Renal function close to baseline.  Baseline creatinine 1.3-1.5.  His creatinine is 1.43, BUN 15, GFR 49.  Monitor renal function   Obesity (BMI 30-39.9):  Patient has Obesity Class I, with body weight 110.2 Kg and BMI 34.87 kg/m2.  Complicating  factor in overall care and prognosis   DVT prophylaxis: Eliquis  Code Status: Full Family Communication: Spouse at bedside 1/12, 1/13 Disposition Plan: Status is: Inpatient Remains inpatient appropriate because: Complicated UTI   Level of care: Telemetry  Consultants:  None  Procedures:  None  Antimicrobials: Cefepime    Subjective: Seen and examined.  Resting comfortably in bed.  Feels well overall.  No distress.  Sugars remain poorly controlled.   Objective: Vitals:   09/06/24 1525 09/06/24 1930 09/07/24 0356 09/07/24 0835  BP: 112/66 115/67 115/68 111/72  Pulse: 78 76 64 72  Resp: 18 18 18 18   Temp: 98.1 F (36.7 C) 98.2 F (36.8 C) 98.3 F (36.8 C) 98.1 F (36.7 C)  TempSrc: Oral     SpO2: 96% 95% 95% 94%  Weight:      Height:        Intake/Output Summary (Last 24 hours) at 09/07/2024 1302 Last data filed at 09/07/2024 1016 Gross per 24 hour  Intake 980.2 ml  Output 1200 ml  Net -219.8 ml   Filed Weights   09/05/24 1627 09/06/24 0240  Weight: 110.2 kg 106.3 kg    Examination:  General exam: No acute distress Respiratory system: Lungs clear.  Normal work of breathing.  Room air Cardiovascular system: S1-S2, RRR, no murmurs, no pedal edema Gastrointestinal system: Obese soft, NT/ND, normal bowel sounds GU: Urostomy Central nervous system: Alert and oriented. No focal neurological deficits. Extremities: Symmetric 5 x 5 power. Skin: No rashes, lesions or ulcers Psychiatry: Judgement and insight appear normal. Mood & affect appropriate.     Data Reviewed: I have personally reviewed following labs and imaging studies  CBC: Recent Labs  Lab 09/05/24 1642 09/06/24 0538  WBC 9.7 6.8  NEUTROABS 5.8  --   HGB 12.4* 12.1*  HCT 37.0* 36.2*  MCV 99.2 98.6  PLT 208 207   Basic Metabolic Panel: Recent Labs  Lab 09/05/24 1642 09/06/24 0538 09/07/24 0539 09/07/24 0647  NA 136 132*  --   --   K 3.7 5.0  --   --   CL 99 99  --   --   CO2 27 23   --   --   GLUCOSE 65* 315*  --  440*  BUN 15 17  --   --   CREATININE 1.43* 1.34* 1.48*  --   CALCIUM  9.3 9.3  --   --   MG 1.9  --   --   --   PHOS 3.2  --   --   --    GFR: Estimated Creatinine Clearance: 48.6 mL/min (A) (by C-G formula based on SCr of 1.48 mg/dL (H)). Liver Function Tests: Recent Labs  Lab 09/05/24 1642  AST 14*  ALT 6  ALKPHOS 80  BILITOT 0.6  PROT 7.5  ALBUMIN 4.0   No results for input(s): LIPASE, AMYLASE in the last 168 hours. No results for input(s): AMMONIA in the last 168 hours. Coagulation Profile: No results for input(s): INR, PROTIME in the last 168 hours. Cardiac Enzymes: No results for input(s): CKTOTAL, CKMB, CKMBINDEX, TROPONINI in the last 168 hours. BNP (last 3 results) No  results for input(s): PROBNP in the last 8760 hours. HbA1C: Recent Labs    09/06/24 1413  HGBA1C 7.7*   CBG: Recent Labs  Lab 09/06/24 2057 09/07/24 0016 09/07/24 0419 09/07/24 0555 09/07/24 1203  GLUCAP 195* 88 348* 439* 247*   Lipid Profile: No results for input(s): CHOL, HDL, LDLCALC, TRIG, CHOLHDL, LDLDIRECT in the last 72 hours. Thyroid  Function Tests: No results for input(s): TSH, T4TOTAL, FREET4, T3FREE, THYROIDAB in the last 72 hours. Anemia Panel: No results for input(s): VITAMINB12, FOLATE, FERRITIN, TIBC, IRON, RETICCTPCT in the last 72 hours. Sepsis Labs: Recent Labs  Lab 09/05/24 1642 09/05/24 2112  LATICACIDVEN 2.2* 1.3    Recent Results (from the past 240 hours)  Resp panel by RT-PCR (RSV, Flu A&B, Covid) Anterior Nasal Swab     Status: None   Collection Time: 09/05/24  4:41 PM   Specimen: Anterior Nasal Swab  Result Value Ref Range Status   SARS Coronavirus 2 by RT PCR NEGATIVE NEGATIVE Final    Comment: (NOTE) SARS-CoV-2 target nucleic acids are NOT DETECTED.  The SARS-CoV-2 RNA is generally detectable in upper respiratory specimens during the acute phase of infection. The  lowest concentration of SARS-CoV-2 viral copies this assay can detect is 138 copies/mL. A negative result does not preclude SARS-Cov-2 infection and should not be used as the sole basis for treatment or other patient management decisions. A negative result may occur with  improper specimen collection/handling, submission of specimen other than nasopharyngeal swab, presence of viral mutation(s) within the areas targeted by this assay, and inadequate number of viral copies(<138 copies/mL). A negative result must be combined with clinical observations, patient history, and epidemiological information. The expected result is Negative.  Fact Sheet for Patients:  bloggercourse.com  Fact Sheet for Healthcare Providers:  seriousbroker.it  This test is no t yet approved or cleared by the [place]  FDA and  has been authorized for detection and/or diagnosis of SARS-CoV-2 by FDA under an Emergency Use Authorization (EUA). This EUA will remain  in effect (meaning this test can be used) for the duration of the COVID-19 declaration under Section 564(b)(1) of the Act, 21 U.S.C.section 360bbb-3(b)(1), unless the authorization is terminated  or revoked sooner.       Influenza A by PCR NEGATIVE NEGATIVE Final   Influenza B by PCR NEGATIVE NEGATIVE Final    Comment: (NOTE) The Xpert Xpress SARS-CoV-2/FLU/RSV plus assay is intended as an aid in the diagnosis of influenza from Nasopharyngeal swab specimens and should not be used as a sole basis for treatment. Nasal washings and aspirates are unacceptable for Xpert Xpress SARS-CoV-2/FLU/RSV testing.  Fact Sheet for Patients: bloggercourse.com  Fact Sheet for Healthcare Providers: seriousbroker.it  This test is not yet approved or cleared by the [place]  FDA and has been authorized for detection and/or diagnosis of SARS-CoV-2 by FDA under  an Emergency Use Authorization (EUA). This EUA will remain in effect (meaning this test can be used) for the duration of the COVID-19 declaration under Section 564(b)(1) of the Act, 21 U.S.C. section 360bbb-3(b)(1), unless the authorization is terminated or revoked.     Resp Syncytial Virus by PCR NEGATIVE NEGATIVE Final    Comment: (NOTE) Fact Sheet for Patients: bloggercourse.com  Fact Sheet for Healthcare Providers: seriousbroker.it  This test is not yet approved or cleared by the [place]  FDA and has been authorized for detection and/or diagnosis of SARS-CoV-2 by FDA under an Emergency Use Authorization (EUA). This EUA will remain in effect (meaning this test  can be used) for the duration of the COVID-19 declaration under Section 564(b)(1) of the Act, 21 U.S.C. section 360bbb-3(b)(1), unless the authorization is terminated or revoked.  Performed at [facility], [address], [place], [place] [address]   Urine Culture     Status: Abnormal   Collection Time: 09/05/24  6:21 PM   Specimen: Urine, Random  Result Value Ref Range Status   Specimen Description   Final    URINE, RANDOM Performed at [facility], [address] [place], [place] [address]    Special Requests   Final    NONE Reflexed from [phone] Performed at [facility], [address] [place], [place] [address]    Culture >=100,000 COLONIES/mL ESCHERICHIA COLI (A)  Final   Report Status 09/07/2024 FINAL  Final   Organism ID, Bacteria ESCHERICHIA COLI (A)  Final      Susceptibility   Escherichia coli - MIC*    AMPICILLIN 8 SENSITIVE Sensitive     CEFAZOLIN (URINE) Value in next row Sensitive      16 SENSITIVEThis is a modified FDA-approved test that has been validated and its performance characteristics determined by the reporting laboratory.  This laboratory is certified under the Clinical Laboratory Improvement Amendments  CLIA as qualified to perform high complexity clinical laboratory testing.    CEFEPIME  Value in next row Sensitive      16 SENSITIVEThis is a modified FDA-approved test that has been validated and its performance characteristics determined by the reporting laboratory.  This laboratory is certified under the Clinical Laboratory Improvement Amendments CLIA as qualified to perform high complexity clinical laboratory testing.    ERTAPENEM Value in next row Sensitive      16 SENSITIVEThis is a modified FDA-approved test that has been validated and its performance characteristics determined by the reporting laboratory.  This laboratory is certified under the Clinical Laboratory Improvement Amendments CLIA as qualified to perform high complexity clinical laboratory testing.    CEFTRIAXONE  Value in next row Sensitive      16 SENSITIVEThis is a modified FDA-approved test that has been validated and its performance characteristics determined by the reporting laboratory.  This laboratory is certified under the Clinical Laboratory Improvement Amendments CLIA as qualified to perform high complexity clinical laboratory testing.    CIPROFLOXACIN  Value in next row Sensitive      16 SENSITIVEThis is a modified FDA-approved test that has been validated and its performance characteristics determined by the reporting laboratory.  This laboratory is certified under the Clinical Laboratory Improvement Amendments CLIA as qualified to perform high complexity clinical laboratory testing.    GENTAMICIN Value in next row Sensitive      16 SENSITIVEThis is a modified FDA-approved test that has been validated and its performance characteristics determined by the reporting laboratory.  This laboratory is certified under the Clinical Laboratory Improvement Amendments CLIA as qualified to perform high complexity clinical laboratory testing.    NITROFURANTOIN  Value in next row Resistant      16 SENSITIVEThis is a modified FDA-approved  test that has been validated and its performance characteristics determined by the reporting laboratory.  This laboratory is certified under the Clinical Laboratory Improvement Amendments CLIA as qualified to perform high complexity clinical laboratory testing.    TRIMETH /SULFA  Value in next row Sensitive      16 SENSITIVEThis is a modified FDA-approved test that has been validated and its performance characteristics determined by the reporting laboratory.  This laboratory is certified under the Clinical Laboratory  Improvement Amendments CLIA as qualified to perform high complexity clinical laboratory testing.    AMPICILLIN/SULBACTAM Value in next row Sensitive      16 SENSITIVEThis is a modified FDA-approved test that has been validated and its performance characteristics determined by the reporting laboratory.  This laboratory is certified under the Clinical Laboratory Improvement Amendments CLIA as qualified to perform high complexity clinical laboratory testing.    PIP/TAZO Value in next row Sensitive      <=4 SENSITIVEThis is a modified FDA-approved test that has been validated and its performance characteristics determined by the reporting laboratory.  This laboratory is certified under the Clinical Laboratory Improvement Amendments CLIA as qualified to perform high complexity clinical laboratory testing.    MEROPENEM  Value in next row Sensitive      <=4 SENSITIVEThis is a modified FDA-approved test that has been validated and its performance characteristics determined by the reporting laboratory.  This laboratory is certified under the Clinical Laboratory Improvement Amendments CLIA as qualified to perform high complexity clinical laboratory testing.    * >=100,000 COLONIES/mL ESCHERICHIA COLI         Radiology Studies: DG Chest Portable 1 View Result Date: 09/05/2024 EXAM: 1 VIEW XRAY OF THE CHEST 09/05/2024 07:03:35 PM COMPARISON: Chest x-ray 10/17/2023, CT angio chest 11/17/2017. CLINICAL  HISTORY: weakness FINDINGS: LINES, TUBES AND DEVICES: Right chest port in place with catheter tip over the right atrium, approximately 3 cm distal to the cavoatrial junction. LUNGS AND PLEURA: Left lung base atelectasis. Chronic coarsened interstitial markings. No pleural effusion. No pneumothorax. HEART AND MEDIASTINUM: No acute abnormality of the cardiac and mediastinal silhouettes. BONES AND SOFT TISSUES: No acute osseous abnormality. IMPRESSION: 1. Left basilar atelectasis. 2. Right chest port with catheter tip projecting over the right atrium approximately 3 cm distal to the cavoatrial junction. 3. Emphysema. Electronically signed by: Morgane Naveau MD MD 09/05/2024 07:09 PM EST RP Workstation: [id number]        Scheduled Meds:  apixaban   5 mg Oral BID   atorvastatin   10 mg Oral q1800   [START ON 09/08/2024] ciprofloxacin   500 mg Oral BID   gabapentin   600 mg Oral BID   hydrocortisone   10 mg Oral QPM   hydrocortisone   20 mg Oral q morning   insulin  aspart  0-20 Units Subcutaneous TID WC   insulin  aspart  0-5 Units Subcutaneous QHS   insulin  aspart  10 Units Subcutaneous TID WC   insulin  glargine  38 Units Subcutaneous QHS   levothyroxine   112 mcg Oral Q0600   Continuous Infusions:  ceFEPime  (MAXIPIME ) IV 2 g (09/07/24 0508)     LOS: 2 days      Calvin KATHEE Robson, MD [facility]   If 7PM-7AM, please contact night-coverage  09/07/2024, 1:02 PM   "

## 2024-09-07 NOTE — Progress Notes (Addendum)
 Cbg at 0016 was 88. Per Mr.Vandermeulen's request juice was provided with one 21g peanut butter by CNA

## 2024-09-07 NOTE — Progress Notes (Signed)
 Mr.Marmol expressed concerns pertaining to new order of  Lantus  increase to 48 units. Cbg 195 and to received Lantus  48 units which was increased today from. Lantus  education provided in-conjunction with Solu- Cortef  also he is able to refuse comfortable or feel unsafe. Mr.Feeley  requested provider notified to confirm take 48 and not 38 units as he has concerns blood glucose  will drop too low. On-call provider states patient was evaluated by diabetes coordinator and entered new orders to recheck cbg at 0000 and 0400 once Lantus  is administered. Lastly on-call provider stated to reinforce steroid education.  Mr. Fornwalt states understanding to education and agrees to received the Lantus  though repetitively ask why was the dose increased and continues to express concerns for medication administration. Per nursing judgement Lantus  not administered as Mr.Dirusso answers are incongruent and stated something is going to happen to me when went to administer Lantus . Lantus  held provider aware and wife at bedside.

## 2024-09-07 NOTE — Progress Notes (Signed)
 Mobility Specialist Progress Note:    09/07/24 1149  Mobility  Activity Ambulated with assistance  Level of Assistance Modified independent, requires aide device or extra time  Assistive Device None  Distance Ambulated (ft) 200 ft  Range of Motion/Exercises Active;All extremities  Activity Response Tolerated well  Mobility visit 1 Mobility  Mobility Specialist Start Time (ACUTE ONLY) 1137  Mobility Specialist Stop Time (ACUTE ONLY) 1149  Mobility Specialist Time Calculation (min) (ACUTE ONLY) 12 min   Pt received sitting EOB, wife in room. Agreeable to mobility, ModI to stand and ambulate with no AD. Tolerated well, asx throughout. Returned to room, all needs met.  Sherrilee Ditty Mobility Specialist Please contact via Special Educational Needs Teacher or  Rehab office at [phone]

## 2024-09-07 NOTE — Progress Notes (Signed)
 Clarified- pt states had 2 peanut butters (21g) and crackers

## 2024-09-07 NOTE — Inpatient Diabetes Management (Signed)
 Inpatient Diabetes Program Recommendations  AACE/ADA: New Consensus Statement on Inpatient Glycemic Control (2015)  Target Ranges:  Prepandial:   less than 140 mg/dL      Peak postprandial:   less than 180 mg/dL (1-2 hours)      Critically ill patients:  140 - 180 mg/dL   Lab Results  Component Value Date   GLUCAP 439 (H) 09/07/2024   HGBA1C 7.7 (H) 09/06/2024    Latest Reference Range & Units 09/06/24 08:50 09/06/24 12:56 09/06/24 14:05 09/06/24 15:22 09/06/24 17:35 09/06/24 20:57 09/07/24 00:16 09/07/24 04:19 09/07/24 05:55  Glucose-Capillary 70 - 99 mg/dL 696 (H) 594 (H) 518 (H) 426 (H) 331 (H) 195 (H) 88 348 (H) 439 (H)  (H): Data is abnormally high  Diabetes history: DM2 Outpatient Diabetes medications:  Lantus  48 units daily Novolog  25-40 units tid meal coverage Dexcom G7 Current orders for Inpatient glycemic control: Lantus  38 units daily Novolog  8 units tid meal coverage Novolog  0-20 units tid, 0-5 units hs Prednisone  20 mg daily, 10 mg nightly  Inpatient Diabetes Program Recommendations:   Spoke with patient regarding insulin  management. Patient refused Lantus  insulin  last hs due to lower glucose from Novolog  doses. Patient prefers to wait until tonight to get his next dose of Lantus . Patient normally counts carbohydrates ac meals and takes Novolog  based on 1 unit per 5 gms carbohydrate. Please consider: -Change Novolog  meal coverage to 10 units tid if eats 50%  Thank you, Twala Collings E. Rumaisa Schnetzer, RN, MSN, CNS, CDCES  Diabetes Coordinator Inpatient Glycemic Control Team Team Pager [phone] (8am-5pm) 09/07/2024 9:54 AM

## 2024-09-08 ENCOUNTER — Other Ambulatory Visit: Payer: Self-pay

## 2024-09-08 DIAGNOSIS — N390 Urinary tract infection, site not specified: Secondary | ICD-10-CM | POA: Diagnosis not present

## 2024-09-08 LAB — GLUCOSE, CAPILLARY: Glucose-Capillary: 222 mg/dL — ABNORMAL HIGH (ref 70–99)

## 2024-09-08 MED ORDER — CIPROFLOXACIN HCL 500 MG PO TABS
500.0000 mg | ORAL_TABLET | Freq: Two times a day (BID) | ORAL | 0 refills | Status: AC
  Filled 2024-09-08: qty 10, 5d supply, fill #0

## 2024-09-08 MED ORDER — LEVOTHYROXINE SODIUM 112 MCG PO TABS
112.0000 ug | ORAL_TABLET | Freq: Every day | ORAL | 18 refills | Status: AC
  Filled 2024-09-08: qty 30, 30d supply, fill #0

## 2024-09-08 NOTE — Discharge Summary (Signed)
 " Physician Discharge Summary   Patient: Christian [name] MRN: [id number] DOB: Jan 16, 1941  Admit date:     09/05/2024  Discharge date: 09/08/2024  Discharge Physician: Drue ONEIDA Potter   PCP: Claudene Rayfield HERO, MD   Recommendations at discharge:  Follow-up with PCP  Discharge Diagnoses: Principal Problem:   Complicated UTI (urinary tract infection) Active Problems:   Adrenal insufficiency due to cancer therapy   Hypotension   Syncope   Coronary artery disease   Hypothyroidism   Type II diabetes mellitus with renal manifestations (HCC)   Bladder cancer metastasized to intra-abdominal lymph nodes (HCC)   History of DVT (deep vein thrombosis)   Chronic atrial fibrillation (HCC)   Chronic kidney disease, stage 3a (HCC)   Obesity (BMI 30-39.9)  Resolved Problems:   * No resolved hospital problems. *  Hospital Course:  From HPI 84 y.o. male with medical history significant of bladder cancer of bladder cancer metastasized to intra abdominal lymph nodes (post cystectomy and ileal conduit urostomy), DM, DVT and A fib on Eliquis , CAD, HTN, DM,  adrenal insufficiency due to cancer therapy, CKD 3a, hypothyroidism, who presents with generalized weakness, dizziness, syncope.   Per patient and his wife at his bedside, he started having generalized weakness and dizziness since yesterday evening.  He has nausea and few times of nonbilious nonbloody vomiting.  No diarrhea or abdominal pain.  No fever or chills.  He states that his dizziness is worse when standing up. Per his wife, when EMS tried to help him stand up, patient passed out for few seconds.  His wife lowered him down to the floor without any injury.  Patient strongly denies any head or neck injury.  Does not want to CT scan of the head.  No unilateral numbness or tingling in extremities.  No facial droop or slurred speech.  Moves all extremities normally.    Per EMS report, patient was hypotensive with blood pressure 70s/40s which improved after  giving 1 L IV fluid bolus.  His blood pressure is 111/65 in ED.  Patient denies chest pain, cough, SOB.  No fever or chills.       Assessment & Plan:     Complicated UTI (urinary tract infection):  No fever or leukocytosis, clinically not septic.  Lactic acid 2.2 --> 1.3. Urine culture with E. coli only resistant to Macrobid  We will complete oral antibiotics at discharge   Adrenal insufficiency due to cancer therapy: Holding home steroids Started on stress dose steroids 100 mg every 8 hours in ED Plan Discontinue stress dose steroids Continue home Solu-Cortef  regimen 20 every morning, 10 every afternoon   Hypotension:  Blood pressure improved from 70/40 to 111/65.   This is likely due to combination for dehydration and adrenal insufficiency. Plan: Home steroid regimen as above Pressure control improved   Syncope:  Likely due to hypotension.  Patient denies any injury.  Does not want to CT scan of the head.  No focal neurodeficit on physical examination.  No headache.  Mental status normal now.     Coronary artery disease:  No chest pain Lipitor   Hypothyroidism Synthroid    Insulin -dependent diabetes mellitus HCC):  Recent A1c 8.6, poorly controlled.   Patient taking NovoLog  and Lantus  38 units nightly He follows with endocrinology at [facility] Per patient his goal sugar levels are 200-250 Plan: Basal bolus regimen Carb modified diet DM coordinator consult CBGs AC and at bedtime   Bladder cancer metastasized to intra-abdominal lymph nodes [facility]):  S/p  of bladder resection.  Treated with Keytruda S/p of urostomy Outpatient urology follow-up   History of DVT (deep vein thrombosis) Continue Eliquis    Chronic atrial fibrillation (HCC) DC telemetry Continue Eliquis  as above   Chronic kidney disease, stage 3a (HCC):  Renal function close to baseline.  Baseline creatinine 1.3-1.5.  His creatinine is 1.43, BUN 15, GFR 49.  Monitor renal function   Obesity (BMI  30-39.9):  Patient has Obesity Class I, with body weight 110.2 Kg and BMI 34.87 kg/m2.  Complicating factor in overall care and prognosis     Consultants: None Procedures performed: None Disposition: Home Diet recommendation:  Cardiac diet DISCHARGE MEDICATION: Allergies as of 09/08/2024       Reactions   Ceftriaxone  Hives   Tolerated cefazolin 09/2021 without reaction Received piperacillin /tazobactam in 2019 (after reported ceftriaxone  allergy which was in 2018) Tolerated cefazolin 09/2021 without reaction    Tolerated cefazolin 09/2021 without reaction Received piperacillin /tazobactam in 2019 (after reported ceftriaxone  allergy which was in 2018)   Other Hives   Sulfasalazine Hives   Sulfa  Antibiotics Hives        Medication List     STOP taking these medications    nitrofurantoin  (macrocrystal-monohydrate) 100 MG capsule Commonly known as: Macrobid        TAKE these medications    acetaminophen  325 MG tablet Commonly known as: TYLENOL  Take 1-2 tablets (325-650 mg total) by mouth every 6 (six) hours as needed for mild pain (pain score 1-3), moderate pain (pain score 4-6), fever or headache (or Fever >/= 101).   apixaban  5 MG Tabs tablet Commonly known as: Eliquis  Take 1 tablet (5 mg total) by mouth 2 (two) times daily.   atorvastatin  10 MG tablet Commonly known as: LIPITOR Take 1 tablet (10 mg total) by mouth daily at 6 PM.   ciprofloxacin  500 MG tablet Commonly known as: CIPRO  Take 1 tablet (500 mg total) by mouth 2 (two) times daily for 5 days.   Dexcom G7 Receiver Espiridion See admin instructions.   Dexcom G7 Sensor Misc USE 1 EACH EVERY 10 DAYS   fluticasone  50 MCG/ACT nasal spray Commonly known as: FLONASE  Place 2 sprays into both nostrils as needed.   gabapentin  300 MG capsule Commonly known as: NEURONTIN  Take 600 mg by mouth 2 (two) times daily.   hydrocortisone  10 MG tablet Commonly known as: CORTEF  Take 20 mg in the morning and 10 mg at  night   Insulin  Aspart FlexPen 100 UNIT/ML Commonly known as: NOVOLOG  Inject 25-40 units subcutaneously before meals three times daily as directed. Max Total Daily Dose 130 units per day.   Lantus  SoloStar 100 UNIT/ML Solostar Pen Generic drug: insulin  glargine Inject 48 Units into the skin at bedtime.   levothyroxine  112 MCG tablet Commonly known as: SYNTHROID  Take 1 tablet (112 mcg total) by mouth daily before breakfast.   lidocaine 5 % Commonly known as: LIDODERM Place onto the skin as directed.   nitroGLYCERIN  0.4 MG SL tablet Commonly known as: NITROSTAT  Place 1 tablet (0.4 mg total) under the tongue every 5 (five) minutes as needed for chest pain.   OneTouch Delica Plus Lancet33G Misc Apply 1 each topically 3 (three) times daily.   OneTouch Ultra test strip Generic drug: glucose blood 1 each (1 strip total) 3 (three) times daily Use as instructed. Dx code: E11.9        Discharge Exam: Filed Weights   09/05/24 1627 09/06/24 0240 09/08/24 0500  Weight: 110.2 kg 106.3 kg 101 kg  General exam: No acute distress Respiratory system: Lungs clear.  Normal work of breathing.  Room air Cardiovascular system: S1-S2, RRR, no murmurs, no pedal edema Gastrointestinal system: Obese soft, NT/ND, normal bowel sounds GU: Urostomy Central nervous system: Alert and oriented. No focal neurological deficits. Extremities: Symmetric 5 x 5 power. Skin: No rashes, lesions or ulcers Psychiatry: Judgement and insight appear normal. Mood & affect appropriate.   Condition at discharge: good  The results of significant diagnostics from this hospitalization (including imaging, microbiology, ancillary and laboratory) are listed below for reference.   Imaging Studies: DG Chest Portable 1 View Result Date: 09/05/2024 EXAM: 1 VIEW XRAY OF THE CHEST 09/05/2024 07:03:35 PM COMPARISON: Chest x-ray 10/17/2023, CT angio chest 11/17/2017. CLINICAL HISTORY: weakness FINDINGS: LINES, TUBES AND  DEVICES: Right chest port in place with catheter tip over the right atrium, approximately 3 cm distal to the cavoatrial junction. LUNGS AND PLEURA: Left lung base atelectasis. Chronic coarsened interstitial markings. No pleural effusion. No pneumothorax. HEART AND MEDIASTINUM: No acute abnormality of the cardiac and mediastinal silhouettes. BONES AND SOFT TISSUES: No acute osseous abnormality. IMPRESSION: 1. Left basilar atelectasis. 2. Right chest port with catheter tip projecting over the right atrium approximately 3 cm distal to the cavoatrial junction. 3. Emphysema. Electronically signed by: Morgane Naveau MD MD 09/05/2024 07:09 PM EST RP Workstation: [id number]    Microbiology: Results for orders placed or performed during the hospital encounter of 09/05/24  Resp panel by RT-PCR (RSV, Flu A&B, Covid) Anterior Nasal Swab     Status: None   Collection Time: 09/05/24  4:41 PM   Specimen: Anterior Nasal Swab  Result Value Ref Range Status   SARS Coronavirus 2 by RT PCR NEGATIVE NEGATIVE Final    Comment: (NOTE) SARS-CoV-2 target nucleic acids are NOT DETECTED.  The SARS-CoV-2 RNA is generally detectable in upper respiratory specimens during the acute phase of infection. The lowest concentration of SARS-CoV-2 viral copies this assay can detect is 138 copies/mL. A negative result does not preclude SARS-Cov-2 infection and should not be used as the sole basis for treatment or other patient management decisions. A negative result may occur with  improper specimen collection/handling, submission of specimen other than nasopharyngeal swab, presence of viral mutation(s) within the areas targeted by this assay, and inadequate number of viral copies(<138 copies/mL). A negative result must be combined with clinical observations, patient history, and epidemiological information. The expected result is Negative.  Fact Sheet for Patients:  bloggercourse.com  Fact Sheet for  Healthcare Providers:  seriousbroker.it  This test is no t yet approved or cleared by the [place]  FDA and  has been authorized for detection and/or diagnosis of SARS-CoV-2 by FDA under an Emergency Use Authorization (EUA). This EUA will remain  in effect (meaning this test can be used) for the duration of the COVID-19 declaration under Section 564(b)(1) of the Act, 21 U.S.C.section 360bbb-3(b)(1), unless the authorization is terminated  or revoked sooner.       Influenza A by PCR NEGATIVE NEGATIVE Final   Influenza B by PCR NEGATIVE NEGATIVE Final    Comment: (NOTE) The Xpert Xpress SARS-CoV-2/FLU/RSV plus assay is intended as an aid in the diagnosis of influenza from Nasopharyngeal swab specimens and should not be used as a sole basis for treatment. Nasal washings and aspirates are unacceptable for Xpert Xpress SARS-CoV-2/FLU/RSV testing.  Fact Sheet for Patients: bloggercourse.com  Fact Sheet for Healthcare Providers: seriousbroker.it  This test is not yet approved or cleared by the [place]  FDA  and has been authorized for detection and/or diagnosis of SARS-CoV-2 by FDA under an Emergency Use Authorization (EUA). This EUA will remain in effect (meaning this test can be used) for the duration of the COVID-19 declaration under Section 564(b)(1) of the Act, 21 U.S.C. section 360bbb-3(b)(1), unless the authorization is terminated or revoked.     Resp Syncytial Virus by PCR NEGATIVE NEGATIVE Final    Comment: (NOTE) Fact Sheet for Patients: bloggercourse.com  Fact Sheet for Healthcare Providers: seriousbroker.it  This test is not yet approved or cleared by the [place]  FDA and has been authorized for detection and/or diagnosis of SARS-CoV-2 by FDA under an Emergency Use Authorization (EUA). This EUA will remain in effect (meaning this  test can be used) for the duration of the COVID-19 declaration under Section 564(b)(1) of the Act, 21 U.S.C. section 360bbb-3(b)(1), unless the authorization is terminated or revoked.  Performed at [facility], [address] [place], [place] [address]   Urine Culture     Status: Abnormal   Collection Time: 09/05/24  6:21 PM   Specimen: Urine, Random  Result Value Ref Range Status   Specimen Description   Final    URINE, RANDOM Performed at [facility], [address] [place], [place] [address]    Special Requests   Final    NONE Reflexed from [id number] Performed at [facility], [address] [place], [place] [address]    Culture >=100,000 COLONIES/mL ESCHERICHIA COLI (A)  Final   Report Status 09/07/2024 FINAL  Final   Organism ID, Bacteria ESCHERICHIA COLI (A)  Final      Susceptibility   Escherichia coli - MIC*    AMPICILLIN 8 SENSITIVE Sensitive     CEFAZOLIN (URINE) Value in next row Sensitive      16 SENSITIVEThis is a modified FDA-approved test that has been validated and its performance characteristics determined by the reporting laboratory.  This laboratory is certified under the Clinical Laboratory Improvement Amendments CLIA as qualified to perform high complexity clinical laboratory testing.    CEFEPIME  Value in next row Sensitive      16 SENSITIVEThis is a modified FDA-approved test that has been validated and its performance characteristics determined by the reporting laboratory.  This laboratory is certified under the Clinical Laboratory Improvement Amendments CLIA as qualified to perform high complexity clinical laboratory testing.    ERTAPENEM Value in next row Sensitive      16 SENSITIVEThis is a modified FDA-approved test that has been validated and its performance characteristics determined by the reporting laboratory.  This laboratory is certified under the Clinical Laboratory Improvement Amendments CLIA as qualified to perform  high complexity clinical laboratory testing.    CEFTRIAXONE  Value in next row Sensitive      16 SENSITIVEThis is a modified FDA-approved test that has been validated and its performance characteristics determined by the reporting laboratory.  This laboratory is certified under the Clinical Laboratory Improvement Amendments CLIA as qualified to perform high complexity clinical laboratory testing.    CIPROFLOXACIN  Value in next row Sensitive      16 SENSITIVEThis is a modified FDA-approved test that has been validated and its performance characteristics determined by the reporting laboratory.  This laboratory is certified under the Clinical Laboratory Improvement Amendments CLIA as qualified to perform high complexity clinical laboratory testing.    GENTAMICIN Value in next row Sensitive      16 SENSITIVEThis is a modified FDA-approved test that has been validated and its performance characteristics  determined by the reporting laboratory.  This laboratory is certified under the Clinical Laboratory Improvement Amendments CLIA as qualified to perform high complexity clinical laboratory testing.    NITROFURANTOIN  Value in next row Resistant      16 SENSITIVEThis is a modified FDA-approved test that has been validated and its performance characteristics determined by the reporting laboratory.  This laboratory is certified under the Clinical Laboratory Improvement Amendments CLIA as qualified to perform high complexity clinical laboratory testing.    TRIMETH /SULFA  Value in next row Sensitive      16 SENSITIVEThis is a modified FDA-approved test that has been validated and its performance characteristics determined by the reporting laboratory.  This laboratory is certified under the Clinical Laboratory Improvement Amendments CLIA as qualified to perform high complexity clinical laboratory testing.    AMPICILLIN/SULBACTAM Value in next row Sensitive      16 SENSITIVEThis is a modified FDA-approved test that has  been validated and its performance characteristics determined by the reporting laboratory.  This laboratory is certified under the Clinical Laboratory Improvement Amendments CLIA as qualified to perform high complexity clinical laboratory testing.    PIP/TAZO Value in next row Sensitive      <=4 SENSITIVEThis is a modified FDA-approved test that has been validated and its performance characteristics determined by the reporting laboratory.  This laboratory is certified under the Clinical Laboratory Improvement Amendments CLIA as qualified to perform high complexity clinical laboratory testing.    MEROPENEM  Value in next row Sensitive      <=4 SENSITIVEThis is a modified FDA-approved test that has been validated and its performance characteristics determined by the reporting laboratory.  This laboratory is certified under the Clinical Laboratory Improvement Amendments CLIA as qualified to perform high complexity clinical laboratory testing.    * >=100,000 COLONIES/mL ESCHERICHIA COLI    Labs: CBC: Recent Labs  Lab 09/05/24 1642 09/06/24 0538  WBC 9.7 6.8  NEUTROABS 5.8  --   HGB 12.4* 12.1*  HCT 37.0* 36.2*  MCV 99.2 98.6  PLT 208 207   Basic Metabolic Panel: Recent Labs  Lab 09/05/24 1642 09/06/24 0538 09/07/24 0539 09/07/24 0647  NA 136 132*  --   --   K 3.7 5.0  --   --   CL 99 99  --   --   CO2 27 23  --   --   GLUCOSE 65* 315*  --  440*  BUN 15 17  --   --   CREATININE 1.43* 1.34* 1.48*  --   CALCIUM  9.3 9.3  --   --   MG 1.9  --   --   --   PHOS 3.2  --   --   --    Liver Function Tests: Recent Labs  Lab 09/05/24 1642  AST 14*  ALT 6  ALKPHOS 80  BILITOT 0.6  PROT 7.5  ALBUMIN 4.0   CBG: Recent Labs  Lab 09/07/24 0555 09/07/24 1203 09/07/24 1612 09/07/24 2117 09/08/24 0823  GLUCAP 439* 247* 252* 113* 222*    Discharge time spent:  34 minutes.  Signed: Drue ONEIDA Potter, MD [facility] 09/08/2024 "

## 2024-09-08 NOTE — Progress Notes (Signed)
 Mobility Specialist Progress Note:    09/08/24 0900  Mobility  Activity Refused and notified nurse if applicable   Pt kindly refused mobility, eager for possible discharge this afternoon. All needs met.  Sherrilee Ditty Mobility Specialist Please contact via Special Educational Needs Teacher or  Rehab office at [phone]

## 2024-10-08 ENCOUNTER — Ambulatory Visit: Admitting: Cardiovascular Disease

## 2025-07-26 ENCOUNTER — Ambulatory Visit (INDEPENDENT_AMBULATORY_CARE_PROVIDER_SITE_OTHER): Admitting: Vascular Surgery

## 2025-07-26 ENCOUNTER — Other Ambulatory Visit (INDEPENDENT_AMBULATORY_CARE_PROVIDER_SITE_OTHER)
